# Patient Record
Sex: Female | Born: 1955 | Race: Black or African American | Hispanic: No | Marital: Single | Smoking: Never smoker
Health system: Southern US, Community
[De-identification: ages and names within clinical notes are randomized; demographics above are authoritative.]

## PROBLEM LIST (undated history)

## (undated) DIAGNOSIS — C50919 Malignant neoplasm of unspecified site of unspecified female breast: Secondary | ICD-10-CM

## (undated) DIAGNOSIS — R112 Nausea with vomiting, unspecified: Secondary | ICD-10-CM

## (undated) DIAGNOSIS — M722 Plantar fascial fibromatosis: Secondary | ICD-10-CM

## (undated) DIAGNOSIS — M1990 Unspecified osteoarthritis, unspecified site: Secondary | ICD-10-CM

## (undated) DIAGNOSIS — R011 Cardiac murmur, unspecified: Secondary | ICD-10-CM

## (undated) DIAGNOSIS — E079 Disorder of thyroid, unspecified: Secondary | ICD-10-CM

## (undated) DIAGNOSIS — Z9889 Other specified postprocedural states: Secondary | ICD-10-CM

## (undated) DIAGNOSIS — Z923 Personal history of irradiation: Secondary | ICD-10-CM

## (undated) DIAGNOSIS — E039 Hypothyroidism, unspecified: Secondary | ICD-10-CM

## (undated) DIAGNOSIS — Z803 Family history of malignant neoplasm of breast: Secondary | ICD-10-CM

## (undated) DIAGNOSIS — T8859XA Other complications of anesthesia, initial encounter: Secondary | ICD-10-CM

## (undated) DIAGNOSIS — I10 Essential (primary) hypertension: Secondary | ICD-10-CM

## (undated) DIAGNOSIS — Z9221 Personal history of antineoplastic chemotherapy: Secondary | ICD-10-CM

## (undated) DIAGNOSIS — T4145XA Adverse effect of unspecified anesthetic, initial encounter: Secondary | ICD-10-CM

## (undated) HISTORY — DX: Essential (primary) hypertension: I10

## (undated) HISTORY — DX: Unspecified osteoarthritis, unspecified site: M19.90

## (undated) HISTORY — PX: CATARACT EXTRACTION: SUR2

## (undated) HISTORY — PX: BREAST BIOPSY: SHX20

## (undated) HISTORY — DX: Family history of malignant neoplasm of breast: Z80.3

## (undated) HISTORY — DX: Disorder of thyroid, unspecified: E07.9

## (undated) HISTORY — PX: FOOT SURGERY: SHX648

## (undated) HISTORY — DX: Plantar fascial fibromatosis: M72.2

---

## 1988-08-17 HISTORY — PX: LUMBAR LAMINECTOMY: SHX95

## 1998-06-25 ENCOUNTER — Other Ambulatory Visit: Admission: RE | Admit: 1998-06-25 | Discharge: 1998-06-25 | Payer: Self-pay | Admitting: Obstetrics and Gynecology

## 1999-06-01 ENCOUNTER — Ambulatory Visit (HOSPITAL_COMMUNITY): Admission: RE | Admit: 1999-06-01 | Discharge: 1999-06-01 | Payer: Self-pay | Admitting: Neurosurgery

## 1999-06-01 ENCOUNTER — Encounter: Payer: Self-pay | Admitting: Neurosurgery

## 1999-12-02 ENCOUNTER — Other Ambulatory Visit: Admission: RE | Admit: 1999-12-02 | Discharge: 1999-12-02 | Payer: Self-pay | Admitting: Obstetrics and Gynecology

## 2000-12-07 ENCOUNTER — Other Ambulatory Visit: Admission: RE | Admit: 2000-12-07 | Discharge: 2000-12-07 | Payer: Self-pay | Admitting: Obstetrics and Gynecology

## 2001-12-27 ENCOUNTER — Other Ambulatory Visit: Admission: RE | Admit: 2001-12-27 | Discharge: 2001-12-27 | Payer: Self-pay | Admitting: Obstetrics and Gynecology

## 2003-01-17 ENCOUNTER — Encounter: Payer: Self-pay | Admitting: Obstetrics and Gynecology

## 2003-01-17 ENCOUNTER — Encounter: Admission: RE | Admit: 2003-01-17 | Discharge: 2003-01-17 | Payer: Self-pay | Admitting: Obstetrics and Gynecology

## 2003-12-26 ENCOUNTER — Encounter: Admission: RE | Admit: 2003-12-26 | Discharge: 2003-12-26 | Payer: Self-pay | Admitting: Internal Medicine

## 2003-12-27 ENCOUNTER — Encounter: Admission: RE | Admit: 2003-12-27 | Discharge: 2003-12-27 | Payer: Self-pay | Admitting: Gastroenterology

## 2004-09-22 ENCOUNTER — Ambulatory Visit: Payer: Self-pay | Admitting: Family Medicine

## 2004-10-13 ENCOUNTER — Ambulatory Visit: Payer: Self-pay | Admitting: Family Medicine

## 2006-02-04 ENCOUNTER — Encounter: Admission: RE | Admit: 2006-02-04 | Discharge: 2006-02-04 | Payer: Self-pay | Admitting: Obstetrics and Gynecology

## 2006-05-10 ENCOUNTER — Ambulatory Visit: Payer: Self-pay | Admitting: Family Medicine

## 2006-06-07 ENCOUNTER — Other Ambulatory Visit: Admission: RE | Admit: 2006-06-07 | Discharge: 2006-06-07 | Payer: Self-pay | Admitting: Obstetrics and Gynecology

## 2006-10-14 ENCOUNTER — Ambulatory Visit: Payer: Self-pay | Admitting: Family Medicine

## 2007-03-17 ENCOUNTER — Ambulatory Visit: Payer: Self-pay | Admitting: Family Medicine

## 2007-03-17 DIAGNOSIS — E039 Hypothyroidism, unspecified: Secondary | ICD-10-CM

## 2007-03-17 DIAGNOSIS — I10 Essential (primary) hypertension: Secondary | ICD-10-CM | POA: Insufficient documentation

## 2007-03-18 ENCOUNTER — Telehealth (INDEPENDENT_AMBULATORY_CARE_PROVIDER_SITE_OTHER): Payer: Self-pay | Admitting: *Deleted

## 2007-03-18 LAB — CONVERTED CEMR LAB
Basophils Absolute: 0.1 10*3/uL (ref 0.0–0.1)
Calcium: 9.3 mg/dL (ref 8.4–10.5)
Chloride: 104 meq/L (ref 96–112)
GFR calc non Af Amer: 95 mL/min
Glucose, Bld: 88 mg/dL (ref 70–99)
HCT: 38.6 % (ref 36.0–46.0)
Hemoglobin: 13.3 g/dL (ref 12.0–15.0)
Monocytes Absolute: 0.5 10*3/uL (ref 0.2–0.7)
Platelets: 304 10*3/uL (ref 150–400)
TSH: 3.81 microintl units/mL (ref 0.35–5.50)
Total CHOL/HDL Ratio: 3.1
Triglycerides: 66 mg/dL (ref 0–149)
VLDL: 13 mg/dL (ref 0–40)
WBC: 10.2 10*3/uL (ref 4.5–10.5)

## 2007-06-09 ENCOUNTER — Other Ambulatory Visit: Admission: RE | Admit: 2007-06-09 | Discharge: 2007-06-09 | Payer: Self-pay | Admitting: Obstetrics and Gynecology

## 2007-06-23 ENCOUNTER — Encounter: Admission: RE | Admit: 2007-06-23 | Discharge: 2007-06-23 | Payer: Self-pay | Admitting: Obstetrics and Gynecology

## 2007-07-01 ENCOUNTER — Encounter: Admission: RE | Admit: 2007-07-01 | Discharge: 2007-07-01 | Payer: Self-pay | Admitting: Obstetrics and Gynecology

## 2007-09-02 ENCOUNTER — Ambulatory Visit: Payer: Self-pay | Admitting: Family Medicine

## 2007-09-02 ENCOUNTER — Ambulatory Visit: Payer: Self-pay | Admitting: Cardiology

## 2007-09-02 LAB — CONVERTED CEMR LAB
CK-MB: 2.8 ng/mL (ref 0.3–4.0)
CO2: 28 meq/L (ref 19–32)
Chloride: 104 meq/L (ref 96–112)
Cholesterol: 198 mg/dL (ref 0–200)
Creatinine, Ser: 0.6 mg/dL (ref 0.4–1.2)
GFR calc Af Amer: 137 mL/min
GFR calc non Af Amer: 113 mL/min
HDL: 72.2 mg/dL (ref >39.0)
LDL Cholesterol: 116 mg/dL — ABNORMAL HIGH (ref 0–99)
Potassium: 3.4 meq/L — ABNORMAL LOW (ref 3.5–5.1)
Total CK: 94 units/L (ref 7–177)
Triglycerides: 48 mg/dL (ref 0–149)
Troponin I: 0.06 ng/mL — ABNORMAL HIGH (ref <0.06)

## 2007-09-05 ENCOUNTER — Encounter (INDEPENDENT_AMBULATORY_CARE_PROVIDER_SITE_OTHER): Payer: Self-pay | Admitting: *Deleted

## 2007-09-12 ENCOUNTER — Encounter: Payer: Self-pay | Admitting: Cardiology

## 2007-09-12 ENCOUNTER — Ambulatory Visit: Payer: Self-pay

## 2007-10-26 ENCOUNTER — Ambulatory Visit: Payer: Self-pay | Admitting: Family Medicine

## 2007-10-27 ENCOUNTER — Encounter (INDEPENDENT_AMBULATORY_CARE_PROVIDER_SITE_OTHER): Payer: Self-pay | Admitting: *Deleted

## 2007-10-27 LAB — CONVERTED CEMR LAB
GFR calc Af Amer: 114 mL/min
GFR calc non Af Amer: 95 mL/min
Sodium: 140 meq/L (ref 135–145)

## 2008-01-30 ENCOUNTER — Ambulatory Visit: Payer: Self-pay | Admitting: Family Medicine

## 2008-03-09 ENCOUNTER — Ambulatory Visit: Payer: Self-pay | Admitting: Family Medicine

## 2008-03-10 LAB — CONVERTED CEMR LAB
CO2: 23 meq/L (ref 19–32)
Glucose, Bld: 76 mg/dL (ref 70–99)

## 2008-03-12 ENCOUNTER — Encounter (INDEPENDENT_AMBULATORY_CARE_PROVIDER_SITE_OTHER): Payer: Self-pay | Admitting: *Deleted

## 2008-05-11 ENCOUNTER — Telehealth (INDEPENDENT_AMBULATORY_CARE_PROVIDER_SITE_OTHER): Payer: Self-pay | Admitting: *Deleted

## 2008-05-23 LAB — CONVERTED CEMR LAB: Pap Smear: NORMAL

## 2008-06-12 ENCOUNTER — Other Ambulatory Visit: Admission: RE | Admit: 2008-06-12 | Discharge: 2008-06-12 | Payer: Self-pay | Admitting: Obstetrics and Gynecology

## 2008-06-18 ENCOUNTER — Ambulatory Visit: Payer: Self-pay | Admitting: Family Medicine

## 2008-06-18 DIAGNOSIS — L919 Hypertrophic disorder of the skin, unspecified: Secondary | ICD-10-CM

## 2008-06-18 DIAGNOSIS — L909 Atrophic disorder of skin, unspecified: Secondary | ICD-10-CM | POA: Insufficient documentation

## 2008-07-24 ENCOUNTER — Encounter: Admission: RE | Admit: 2008-07-24 | Discharge: 2008-07-24 | Payer: Self-pay | Admitting: Obstetrics and Gynecology

## 2008-09-05 ENCOUNTER — Ambulatory Visit: Payer: Self-pay | Admitting: Family Medicine

## 2008-09-18 ENCOUNTER — Encounter: Payer: Self-pay | Admitting: Family Medicine

## 2008-09-18 LAB — HM COLONOSCOPY

## 2008-09-21 LAB — CONVERTED CEMR LAB
ALT: 13 units/L (ref 0–35)
AST: 14 units/L (ref 0–37)
BUN: 9 mg/dL (ref 6–23)
Bilirubin, Direct: 0.1 mg/dL (ref 0.0–0.3)
CO2: 28 meq/L (ref 19–32)
Chloride: 107 meq/L (ref 96–112)
Cholesterol: 186 mg/dL (ref 0–200)
Eosinophils Absolute: 0.1 10*3/uL (ref 0.0–0.7)
Glucose, Bld: 83 mg/dL (ref 70–99)
Hemoglobin: 13.5 g/dL (ref 12.0–15.0)
Lymphocytes Relative: 20.2 % (ref 12.0–46.0)
MCV: 85 fL (ref 78.0–100.0)
Monocytes Absolute: 0.5 10*3/uL (ref 0.1–1.0)
Neutro Abs: 6.9 10*3/uL (ref 1.4–7.7)
Neutrophils Relative %: 72.7 % (ref 43.0–77.0)
Platelets: 328 10*3/uL (ref 150–400)
RBC: 4.68 M/uL (ref 3.87–5.11)
Total Bilirubin: 0.8 mg/dL (ref 0.3–1.2)
WBC: 9.5 10*3/uL (ref 4.5–10.5)

## 2008-09-24 ENCOUNTER — Encounter (INDEPENDENT_AMBULATORY_CARE_PROVIDER_SITE_OTHER): Payer: Self-pay | Admitting: *Deleted

## 2008-09-27 ENCOUNTER — Telehealth (INDEPENDENT_AMBULATORY_CARE_PROVIDER_SITE_OTHER): Payer: Self-pay | Admitting: *Deleted

## 2008-10-16 ENCOUNTER — Ambulatory Visit: Payer: Self-pay | Admitting: Family Medicine

## 2008-10-16 DIAGNOSIS — B009 Herpesviral infection, unspecified: Secondary | ICD-10-CM | POA: Insufficient documentation

## 2008-11-07 ENCOUNTER — Telehealth: Payer: Self-pay | Admitting: Family Medicine

## 2008-11-22 ENCOUNTER — Telehealth (INDEPENDENT_AMBULATORY_CARE_PROVIDER_SITE_OTHER): Payer: Self-pay | Admitting: *Deleted

## 2009-03-01 ENCOUNTER — Ambulatory Visit: Payer: Self-pay | Admitting: Family Medicine

## 2009-03-15 ENCOUNTER — Ambulatory Visit: Payer: Self-pay | Admitting: Family Medicine

## 2009-06-14 ENCOUNTER — Ambulatory Visit: Payer: Self-pay | Admitting: Family Medicine

## 2009-06-21 ENCOUNTER — Other Ambulatory Visit: Admission: RE | Admit: 2009-06-21 | Discharge: 2009-06-21 | Payer: Self-pay | Admitting: Obstetrics and Gynecology

## 2009-07-30 ENCOUNTER — Encounter: Admission: RE | Admit: 2009-07-30 | Discharge: 2009-07-30 | Payer: Self-pay | Admitting: Obstetrics and Gynecology

## 2009-11-15 ENCOUNTER — Encounter (INDEPENDENT_AMBULATORY_CARE_PROVIDER_SITE_OTHER): Payer: Self-pay | Admitting: *Deleted

## 2009-11-15 ENCOUNTER — Ambulatory Visit: Payer: Self-pay | Admitting: Family Medicine

## 2009-11-15 LAB — CONVERTED CEMR LAB
Bilirubin Urine: NEGATIVE
Nitrite: NEGATIVE
Specific Gravity, Urine: 1.02
Urobilinogen, UA: NEGATIVE
pH: 6.5

## 2009-11-18 ENCOUNTER — Telehealth (INDEPENDENT_AMBULATORY_CARE_PROVIDER_SITE_OTHER): Payer: Self-pay | Admitting: *Deleted

## 2009-11-18 LAB — CONVERTED CEMR LAB
Albumin: 3.6 g/dL (ref 3.5–5.2)
Alkaline Phosphatase: 67 units/L (ref 39–117)
Basophils Relative: 0.4 % (ref 0.0–3.0)
CO2: 27 meq/L (ref 19–32)
Chloride: 100 meq/L (ref 96–112)
Cholesterol: 166 mg/dL (ref 0–200)
Eosinophils Absolute: 0.1 10*3/uL (ref 0.0–0.7)
GFR calc non Af Amer: 135.12 mL/min (ref >60)
HDL: 65.6 mg/dL (ref >39.00)
LDL Cholesterol: 94 mg/dL (ref 0–99)
Lymphocytes Relative: 20.2 % (ref 12.0–46.0)
Lymphs Abs: 2.4 10*3/uL (ref 0.7–4.0)
MCV: 85.4 fL (ref 78.0–100.0)
Neutro Abs: 8.6 10*3/uL — ABNORMAL HIGH (ref 1.4–7.7)
Neutrophils Relative %: 72.1 % (ref 43.0–77.0)
Potassium: 3.3 meq/L — ABNORMAL LOW (ref 3.5–5.1)
RBC: 4.73 M/uL (ref 3.87–5.11)
TSH: 0.5 microintl units/mL (ref 0.35–5.50)
Total Bilirubin: 0.7 mg/dL (ref 0.3–1.2)
Total CHOL/HDL Ratio: 3
Triglycerides: 31 mg/dL (ref 0.0–149.0)
WBC: 12 10*3/uL — ABNORMAL HIGH (ref 4.5–10.5)

## 2009-12-16 ENCOUNTER — Ambulatory Visit: Payer: Self-pay | Admitting: Family Medicine

## 2009-12-16 ENCOUNTER — Telehealth (INDEPENDENT_AMBULATORY_CARE_PROVIDER_SITE_OTHER): Payer: Self-pay | Admitting: *Deleted

## 2009-12-24 ENCOUNTER — Ambulatory Visit: Payer: Self-pay | Admitting: Family Medicine

## 2009-12-24 DIAGNOSIS — J019 Acute sinusitis, unspecified: Secondary | ICD-10-CM

## 2010-01-07 ENCOUNTER — Ambulatory Visit: Payer: Self-pay | Admitting: Family Medicine

## 2010-01-14 LAB — CONVERTED CEMR LAB
BUN: 14 mg/dL (ref 6–23)
Calcium: 9.5 mg/dL (ref 8.4–10.5)
Chloride: 104 meq/L (ref 96–112)
Creatinine, Ser: 0.8 mg/dL (ref 0.4–1.2)
Eosinophils Relative: 2.4 % (ref 0.0–5.0)
Glucose, Bld: 126 mg/dL — ABNORMAL HIGH (ref 70–99)
HCT: 41 % (ref 36.0–46.0)
Hemoglobin: 13.8 g/dL (ref 12.0–15.0)
Lymphocytes Relative: 44.5 % (ref 12.0–46.0)
Lymphs Abs: 2.1 10*3/uL (ref 0.7–4.0)
MCHC: 33.8 g/dL (ref 30.0–36.0)
Monocytes Absolute: 0.5 10*3/uL (ref 0.1–1.0)
Neutro Abs: 2 10*3/uL (ref 1.4–7.7)
Platelets: 238 10*3/uL (ref 150.0–400.0)
Potassium: 3.8 meq/L (ref 3.5–5.1)
Sodium: 141 meq/L (ref 135–145)

## 2010-06-27 ENCOUNTER — Telehealth (INDEPENDENT_AMBULATORY_CARE_PROVIDER_SITE_OTHER): Payer: Self-pay | Admitting: *Deleted

## 2010-07-03 ENCOUNTER — Telehealth (INDEPENDENT_AMBULATORY_CARE_PROVIDER_SITE_OTHER): Payer: Self-pay | Admitting: *Deleted

## 2010-08-07 ENCOUNTER — Encounter
Admission: RE | Admit: 2010-08-07 | Discharge: 2010-08-07 | Payer: Self-pay | Source: Home / Self Care | Attending: Obstetrics and Gynecology | Admitting: Obstetrics and Gynecology

## 2010-08-08 ENCOUNTER — Other Ambulatory Visit
Admission: RE | Admit: 2010-08-08 | Discharge: 2010-08-08 | Payer: Self-pay | Source: Home / Self Care | Admitting: Obstetrics and Gynecology

## 2010-09-07 ENCOUNTER — Encounter: Payer: Self-pay | Admitting: Obstetrics and Gynecology

## 2010-09-18 NOTE — Progress Notes (Signed)
Summary: POTASSIUM CL   REFILL  Phone Note Refill Request Message from:  Fax from Pharmacy on June 27, 2010 2:49 PM  Refills Requested: Medication #1:  KLOR-CON M20 20 MEQ CR-TABS 1 by mouth daily.   Last Refilled: 05/29/2010 Rushie Chestnut,  [address], Doroteo Glassman [phone]  Initial call taken by: Jerolyn Shin,  June 27, 2010 2:50 PM    Prescriptions: KLOR-CON M20 20 MEQ CR-TABS (POTASSIUM CHLORIDE CRYS CR) 1 by mouth daily.  #30 Each x 2   Entered by:   Jeremy Johann CMA   Authorized by:   Loreen Freud DO   Signed by:   Jeremy Johann CMA on 06/27/2010   Method used:   Faxed to ...       [facility] [address] #[id number]* (retail)       [address] [place], [place]  [address]       Ph: [phone]       Fax: [phone]   RxID:   [id number]

## 2010-09-18 NOTE — Assessment & Plan Note (Signed)
Summary: congestion//cough//lch   Vital Signs:  Patient profile:   55 year old female Height:      65.5 inches Weight:      264 pounds BMI:     43.42 Temp:     98.3 degrees F oral Pulse rate:   88 / minute Pulse rhythm:   regular BP sitting:   122 / 84  (left arm) Cuff size:   large  Vitals Entered By: [name] CMA (Dec 24, 2009 4:00 PM) CC: Pt here c/o Chest congestion and Head congestion x 2 weeks., URI symptoms   History of Present Illness:       This is a 55 year old woman who presents with URI symptoms.  The symptoms began 2 weeks ago.  Pt took otc coricidan with no relief.  The patient complains of nasal congestion, purulent nasal discharge, productive cough, and sick contacts.  Associated symptoms include fever.  The patient denies low-grade fever (<100.5 degrees), fever of 100.5-103 degrees, fever of 103.1-104 degrees, fever to >104 degrees, stiff neck, dyspnea, wheezing, rash, vomiting, diarrhea, use of an antipyretic, and response to antipyretic.  The patient denies itchy watery eyes, itchy throat, sneezing, seasonal symptoms, response to antihistamine, headache, muscle aches, and severe fatigue.  The patient denies the following risk factors for Strep sinusitis: unilateral facial pain, unilateral nasal discharge, poor response to decongestant, double sickening, tooth pain, Strep exposure, tender adenopathy, and absence of cough.    Current Medications (verified): 1)  Levoxyl 150 Mcg  Tabs (Levothyroxine Sodium) .... Take One Tablet Daily 2)  Jolivette 0.35 Mg Tabs (Norethindrone) .Marland Kitchen.. 1 By Mouth Daily. 3)  Valtrex 1 Gm Tabs (Valacyclovir Hcl) .... As Directed 4)  Hydrochlorothiazide 25 Mg Tabs (Hydrochlorothiazide) .... Take 1 Tab Once Daily 5)  Coreg 6.25 Mg Tabs (Carvedilol) .Marland Kitchen.. 1 By Mouth Two Times A Day 6)  Klor-Con M20 20 Meq Cr-Tabs (Potassium Chloride Crys Cr) .Marland Kitchen.. 1 By Mouth Daily.  Allergies: 1)  ! Penicillin 2)  ! Lisinopril  Physical Exam  General:   Well-developed,well-nourished,in no acute distress; alert,appropriate and cooperative throughout examination Ears:  External ear exam shows no significant lesions or deformities.  Otoscopic examination reveals clear canals, tympanic membranes are intact bilaterally without bulging, retraction, inflammation or discharge. Hearing is grossly normal bilaterally. Nose:  L frontal sinus tenderness and R frontal sinus tenderness.   Mouth:  Oral mucosa and oropharynx without lesions or exudates.  Teeth in good repair. Neck:  cervical lymphadenopathy.   Lungs:  Normal respiratory effort, chest expands symmetrically. Lungs are clear to auscultation, no crackles or wheezes. Heart:  normal rate and no murmur.   Psych:  Oriented X3 and normally interactive.     Impression & Recommendations:  Problem # 1:  SINUSITIS - ACUTE-NOS (ICD-461.9)  Instructed on treatment. Call if symptoms persist or worsen.   Her updated medication list for this problem includes:    Biaxin Xl 500 Mg Xr24h-tab (Clarithromycin) .Marland Kitchen... 2 by mouth once daily    Veramyst 27.5 Mcg/spray Susp (Fluticasone furoate) .Marland Kitchen... 2 sprays each nostril once daily    Mucinex Maximum Strength 1200 Mg Xr12h-tab (Guaifenesin)  Complete Medication List: 1)  Levoxyl 150 Mcg Tabs (Levothyroxine sodium) .... Take one tablet daily 2)  Jolivette 0.35 Mg Tabs (Norethindrone) .Marland Kitchen.. 1 by mouth daily. 3)  Valtrex 1 Gm Tabs (Valacyclovir hcl) .... As directed 4)  Hydrochlorothiazide 25 Mg Tabs (Hydrochlorothiazide) .... Take 1 tab once daily 5)  Coreg 6.25 Mg Tabs (Carvedilol) .Marland Kitchen.. 1 by  mouth two times a day 6)  Klor-con M20 20 Meq Cr-tabs (Potassium chloride crys cr) .Marland Kitchen.. 1 by mouth daily. 7)  Biaxin Xl 500 Mg Xr24h-tab (Clarithromycin) .... 2 by mouth once daily 8)  Veramyst 27.5 Mcg/spray Susp (Fluticasone furoate) .... 2 sprays each nostril once daily 9)  Mucinex Maximum Strength 1200 Mg Xr12h-tab (Guaifenesin)  Patient Instructions: 1)  288.8  401.9   bmp, cbcd---2 weeks Prescriptions: BIAXIN XL 500 MG XR24H-TAB (CLARITHROMYCIN) 2 by mouth once daily  #28 x 0   Entered and Authorized by:   Loreen Freud DO   Signed by:   Loreen Freud DO on 12/24/2009   Method used:   Electronically to        [facility] #[id number]* (retail)       [address] [place], [place]  [address]       Ph: [phone]       Fax: [phone]   RxID:   [id number] KLOR-CON M20 20 MEQ CR-TABS (POTASSIUM CHLORIDE CRYS CR) 1 by mouth daily.  #30 x 2   Entered by:   [name] CMA   Authorized by:   Loreen Freud DO   Signed by:   [name] CMA on 12/24/2009   Method used:   Electronically to        [facility] #[id number]* (retail)       [address] [place], [place]  [address]       Ph: [phone]       Fax: [phone]   RxID:   [id number]

## 2010-09-18 NOTE — Progress Notes (Signed)
Summary: iFob  Phone Note Outgoing Call   Caller: Patient Call placed by: [name] CMA,  Dec 16, 2009 11:29 AM Summary of Call: Got a call from [facility] lab, pt never turned in ifob kit. I called pt and left a voicemail informing her to turin it in or there would be a charge. [name] CMA  Dec 16, 2009 11:29 AM

## 2010-09-18 NOTE — Progress Notes (Signed)
Summary: Lab results   Phone Note Outgoing Call   Call placed by: [name] CMA,  November 18, 2009 4:51 PM Summary of Call: Regarding lab results, LMTCB:  WBC elevated---pt feeling ok---fever , chills etc-----recheck 2-3 weeks  cbcd 288.8    401.9  bmp K low----- send K rich diet----start Kcl 20 meq  #30  1 by mouth once daily -----recheck in 2-3 weeks as well Signed by Loreen Freud DO on 11/17/2009 at 8:40 PM  Follow-up for Phone Call        Left message to call back. [name] CMA  November 19, 2009 4:55 PM   Additional Follow-up for Phone Call Additional follow up Details #1::        Pt called back on 4/6, I recevied message on 4/7. I called pt back and left a message for her to return call. [name] CMA  November 21, 2009 10:17 AM     Additional Follow-up for Phone Call Additional follow up Details #2::    mailed information to patient Follow-up by: Harold Barban,  November 22, 2009 4:14 PM

## 2010-09-18 NOTE — Assessment & Plan Note (Signed)
Summary: CPX,FASTING,[employer] INS/RH.....   Vital Signs:  Patient profile:   55 year old female Height:      65.5 inches Weight:      262 pounds Pulse rate:   92 / minute Pulse rhythm:   regular BP sitting:   126 / 86  (left arm) Cuff size:   large  Vitals Entered By: [name] CMA (November 15, 2009 1:02 PM) CC: Pt here for CPX, no pap.   History of Present Illness: Pt here for cpe and labs.  No pap---  pt has gyn.  No complaints.   Preventive Screening-Counseling & Management  Alcohol-Tobacco     Alcohol drinks/day: <1     Alcohol type: very rare     Smoking Status: never     Passive Smoke Exposure: no  Caffeine-Diet-Exercise     Caffeine use/day: 2     Does Patient Exercise: no     Exercise Counseling: to improve exercise regimen  Hep-HIV-STD-Contraception     HIV Risk: yes     Dental Visit-last 6 months yes     Dental Care Counseling: not indicated; dental care within six months     SBE monthly: no     SBE Education/Counseling: not indicated; SBE done regularly  Safety-Violence-Falls     Seat Belt Use: 100      Sexual History:  sngle.    Current Medications (verified): 1)  Levoxyl 150 Mcg  Tabs (Levothyroxine Sodium) .... Take One Tablet Daily 2)  Jolivette 0.35 Mg Tabs (Norethindrone) .Marland Kitchen.. 1 By Mouth Daily. 3)  Valtrex 1 Gm Tabs (Valacyclovir Hcl) .... As Directed 4)  Hydrochlorothiazide 25 Mg Tabs (Hydrochlorothiazide) .... Take 1 Tab Once Daily 5)  Coreg 6.25 Mg Tabs (Carvedilol) .Marland Kitchen.. 1 By Mouth Two Times A Day  Allergies: 1)  ! Penicillin 2)  ! Lisinopril  Past History:  Past Medical History: Last updated: 03/17/2007 Hypertension Hypothyroidism  Past Surgical History: Last updated: 03/17/2007 Lumbar laminectomy  Family History: Last updated: 09/05/2008 Family History Hypertension Maunt--breast Cancer at  53 yo  Social History: Last updated: 03/17/2007 Occupation: adminstrative assistance Single Never Smoked Alcohol use-yes Drug  use-no Regular exercise-no  Risk Factors: Alcohol Use: <1 (11/15/2009) Caffeine Use: 2 (11/15/2009) Exercise: no (11/15/2009)  Risk Factors: Smoking Status: never (11/15/2009) Passive Smoke Exposure: no (11/15/2009)  Family History: Reviewed history from 09/05/2008 and no changes required. Family History Hypertension Maunt--breast Cancer at  53 yo  Social History: Reviewed history from 03/17/2007 and no changes required. Occupation: adminstrative assistance Single Never Smoked Alcohol use-yes Drug use-no Regular exercise-no Dental Care w/in 6 mos.:  yes Sexual History:  sngle  Review of Systems      See HPI General:  Denies chills, fatigue, fever, loss of appetite, malaise, sleep disorder, sweats, weakness, and weight loss. Eyes:  Denies blurring, discharge, double vision, eye irritation, eye pain, halos, itching, light sensitivity, red eye, vision loss-1 eye, and vision loss-both eyes; optho--q1y . ENT:  Denies decreased hearing, difficulty swallowing, ear discharge, earache, hoarseness, nasal congestion, nosebleeds, postnasal drainage, ringing in ears, sinus pressure, and sore throat. CV:  Denies bluish discoloration of lips or nails, chest pain or discomfort, difficulty breathing at night, difficulty breathing while lying down, fainting, fatigue, leg cramps with exertion, lightheadness, near fainting, palpitations, shortness of breath with exertion, swelling of feet, swelling of hands, and weight gain. Resp:  Denies chest discomfort, chest pain with inspiration, cough, coughing up blood, excessive snoring, hypersomnolence, morning headaches, pleuritic, shortness of breath, sputum productive, and  wheezing. GI:  Denies abdominal pain, bloody stools, change in bowel habits, constipation, dark tarry stools, diarrhea, excessive appetite, gas, hemorrhoids, indigestion, loss of appetite, and nausea. GU:  Denies abnormal vaginal bleeding, decreased libido, discharge, dysuria, genital  sores, hematuria, incontinence, nocturia, urinary frequency, and urinary hesitancy. MS:  Complains of joint pain; denies joint redness, joint swelling, loss of strength, low back pain, mid back pain, muscle aches, muscle , cramps, muscle weakness, stiffness, and thoracic pain; podiatry-- Dr Charlsie Merles . Vaughan Sine:  Denies changes in color of skin, changes in nail beds, dryness, excessive perspiration, flushing, hair loss, insect bite(s), itching, lesion(s), poor wound healing, and rash. Neuro:  Denies brief paralysis, difficulty with concentration, disturbances in coordination, falling down, headaches, inability to speak, memory loss, numbness, poor balance, seizures, sensation of room spinning, tingling, tremors, visual disturbances, and weakness. Psych:  Denies alternate hallucination ( auditory/visual), anxiety, depression, easily angered, easily tearful, irritability, mental problems, panic attacks, sense of great danger, suicidal thoughts/plans, thoughts of violence, unusual visions or sounds, and thoughts /plans of harming others. Endo:  Denies cold intolerance, excessive hunger, excessive thirst, excessive urination, heat intolerance, polyuria, and weight change. Heme:  Denies abnormal bruising, bleeding, enlarge lymph nodes, fevers, pallor, and skin discoloration. Allergy:  Denies hives or rash, itching eyes, persistent infections, seasonal allergies, and sneezing.  Physical Exam  General:  Well-developed,well-nourished,in no acute distress; alert,appropriate and cooperative throughout examination Head:  Normocephalic and atraumatic without obvious abnormalities. No apparent alopecia or balding. Eyes:  pupils equal, pupils round, pupils reactive to light, and no injection.   Ears:  External ear exam shows no significant lesions or deformities.  Otoscopic examination reveals clear canals, tympanic membranes are intact bilaterally without bulging, retraction, inflammation or discharge. Hearing is grossly  normal bilaterally. Nose:  External nasal examination shows no deformity or inflammation. Nasal mucosa are pink and moist without lesions or exudates. Mouth:  Oral mucosa and oropharynx without lesions or exudates.  Teeth in good repair. Neck:  No deformities, masses, or tenderness noted. Breasts:  gyn Lungs:  Normal respiratory effort, chest expands symmetrically. Lungs are clear to auscultation, no crackles or wheezes. Heart:  normal rate and no murmur.   Abdomen:  Bowel sounds positive,abdomen soft and non-tender without masses, organomegaly or hernias noted. Genitalia:  gyn Msk:  No deformity or scoliosis noted of thoracic or lumbar spine.   Pulses:  R and L carotid,radial,femoral,dorsalis pedis and posterior tibial pulses are full and equal bilaterally Extremities:  No clubbing, cyanosis, edema, or deformity noted with normal full range of motion of all joints.   Neurologic:  alert & oriented X3 and strength normal in all extremities.   Skin:  Intact without suspicious lesions or rashes Cervical Nodes:  No lymphadenopathy noted Axillary Nodes:  No palpable lymphadenopathy Psych:  Cognition and judgment appear intact. Alert and cooperative with normal attention span and concentration. No apparent delusions, illusions, hallucinations   Impression & Recommendations:  Problem # 1:  PREVENTIVE HEALTH CARE (ICD-V70.0) check labs ghm utd  Orders: Venipuncture (16073) TLB-Lipid Panel (80061-LIPID) TLB-BMP (Basic Metabolic Panel-BMET) (80048-METABOL) TLB-CBC Platelet - w/Differential (85025-CBCD) TLB-Hepatic/Liver Function Pnl (80076-HEPATIC) TLB-TSH (Thyroid Stimulating Hormone) (84443-TSH) UA Dipstick w/Micro (automated) (81001)  Problem # 2:  MORBID OBESITY (ICD-278.01)  Ht: 65.5 (11/15/2009)   Wt: 262 (11/15/2009)   BMI: 43.95 (03/01/2009)  Problem # 3:  HYPOTHYROIDISM (ICD-244.9)  Her updated medication list for this problem includes:    Levoxyl 150 Mcg Tabs (Levothyroxine  sodium) .Marland Kitchen... Take one tablet daily  Orders:  Venipuncture [phone]) TLB-Lipid Panel (80061-LIPID) TLB-BMP (Basic Metabolic Panel-BMET) (80048-METABOL) TLB-CBC Platelet - w/Differential (85025-CBCD) TLB-Hepatic/Liver Function Pnl (80076-HEPATIC) TLB-TSH (Thyroid Stimulating Hormone) (84443-TSH)  Labs Reviewed: TSH: 0.86 (09/05/2008)    Chol: 186 (09/05/2008)   HDL: 65.9 (09/05/2008)   LDL: 111 (09/05/2008)   TG: 48 (09/05/2008)  Problem # 4:  HYPERTENSION (ICD-401.9)  Her updated medication list for this problem includes:    Hydrochlorothiazide 25 Mg Tabs (Hydrochlorothiazide) .Marland Kitchen... Take 1 tab once daily    Coreg 6.25 Mg Tabs (Carvedilol) .Marland Kitchen... 1 by mouth two times a day  Orders: Venipuncture (29528) TLB-Lipid Panel (80061-LIPID) TLB-BMP (Basic Metabolic Panel-BMET) (80048-METABOL) TLB-CBC Platelet - w/Differential (85025-CBCD) TLB-Hepatic/Liver Function Pnl (80076-HEPATIC) TLB-TSH (Thyroid Stimulating Hormone) (84443-TSH)  BP today: 126/86 Prior BP: 130/88 (06/14/2009)  Labs Reviewed: K+: 3.8 (09/05/2008) Creat: : 0.6 (09/05/2008)   Chol: 186 (09/05/2008)   HDL: 65.9 (09/05/2008)   LDL: 111 (09/05/2008)   TG: 48 (09/05/2008)  Complete Medication List: 1)  Levoxyl 150 Mcg Tabs (Levothyroxine sodium) .... Take one tablet daily 2)  Jolivette 0.35 Mg Tabs (Norethindrone) .Marland Kitchen.. 1 by mouth daily. 3)  Valtrex 1 Gm Tabs (Valacyclovir hcl) .... As directed 4)  Hydrochlorothiazide 25 Mg Tabs (Hydrochlorothiazide) .... Take 1 tab once daily 5)  Coreg 6.25 Mg Tabs (Carvedilol) .Marland Kitchen.. 1 by mouth two times a day  Other Orders: Tdap => 74yrs IM (41324) Admin 1st Vaccine (40102) Prescriptions: LEVOXYL 150 MCG  TABS (LEVOTHYROXINE SODIUM) Take one tablet daily  #30 Tablet x 11   Entered and Authorized by:   Loreen Freud DO   Signed by:   Loreen Freud DO on 11/15/2009   Method used:   Electronically to        [facility] #[id number]* (retail)       [address] [place], [place]  [address]       Ph: [phone]       Fax: [phone]   RxID:   [id number] HYDROCHLOROTHIAZIDE 25 MG TABS (HYDROCHLOROTHIAZIDE) TAKE 1 TAB ONCE DAILY  #100 x 2   Entered and Authorized by:   Loreen Freud DO   Signed by:   Loreen Freud DO on 11/15/2009   Method used:   Electronically to        [facility] #[id number]* (retail)       [address] [place], [place]  [address]       Ph: [phone]       Fax: [phone]   RxID:   [id number] VALTREX 1 GM TABS (VALACYCLOVIR HCL) as directed  #30 x 5   Entered and Authorized by:   Loreen Freud DO   Signed by:   Loreen Freud DO on 11/15/2009   Method used:   Electronically to        [facility] #[id number]* (retail)       [address] [place], [place]  [address]       Ph: [phone]       Fax: [phone]   RxID:   [id number] COREG 6.25 MG TABS (CARVEDILOL) 1 by mouth two times a day  #60.0 Each x 5   Entered and Authorized by:   Loreen Freud DO   Signed by:   Loreen Freud DO on 11/15/2009   Method used:   Electronically to        [facility] #[id number]* (retail)  [address]       [place], [place]  [address]       Ph: [phone]       Fax: [phone]   RxID:   [id number]    EKG  Procedure date:  11/15/2009  Findings:      Normal sinus rhythm with rate of:  80 bpm    Colonoscopy Result Date:  09/18/2008 Colonoscopy Result:  polyps, diverticulosis, hemr Colonoscopy Next Due:  5 yr Last PAP:  Normal (05/23/2008 9:37:05 AM) PAP Result Date:  05/29/2009 PAP Result:  normal PAP Next Due:  1 yr Last Mammogram:  Normal Bilateral (07/04/2008 9:37:05 AM) Mammogram Result Date:  07/30/2009 Mammogram Result:  normal Mammogram Next Due:  1 yr   Immunizations Administered:  Tetanus Vaccine:    Vaccine Type: Tdap    Site: left deltoid    Mfr: GlaxoSmithKline    Dose: 0.5 ml    Route: IM    Given by: [name] CMA    Exp. Date: 11/09/2011     Lot #: [id number]  Laboratory Results   Urine Tests   Date/Time Reported: November 15, 2009 1:50 PM   Routine Urinalysis   Color: yellow Appearance: Clear Glucose: negative   (Normal Range: Negative) Bilirubin: negative   (Normal Range: Negative) Ketone: negative   (Normal Range: Negative) Spec. Gravity: 1.020   (Normal Range: 1.003-1.035) Blood: negative   (Normal Range: Negative) pH: 6.5   (Normal Range: 5.0-8.0) Protein: negative   (Normal Range: Negative) Urobilinogen: negative   (Normal Range: 0-1) Nitrite: negative   (Normal Range: Negative) Leukocyte Esterace: negative   (Normal Range: Negative)    Comments: Floydene Flock  November 15, 2009 1:50 PM

## 2010-09-18 NOTE — Letter (Signed)
Summary: [facility]: Immunoassay Fecal Occult Blood (iFOB) Order Form  [facility] [place]  [address] [place], [place] [address]   Phone: [phone]  Fax: [phone]      [facility]: Immunoassay Fecal Occult Blood (iFOB) Order Form   November 15, 2009 MRN: [id number]   Kayla Banks 07/19/1956   Physicican Name:_____Yvonne Lowne,DO____________________  Diagnosis Code:_______v76.51___________________      [name] CMA

## 2010-09-18 NOTE — Progress Notes (Signed)
Summary: 2nd request for potassium cl refill  Phone Note Refill Request Message from:  Fax from Pharmacy on July 03, 2010 12:57 PM  Refills Requested: Medication #1:  KLOR-CON M20 20 MEQ CR-TABS 1 by mouth daily.   Last Refilled: 05/29/2010   Notes: see note dated 11/11---second request from pharmacy [facility], [address], [place]  [phone]   [phone]   qty =30   note on fax states "Patient would like multiple refills on this prescriptioni"  Next Appointment Scheduled: none Initial call taken by: Jerolyn Shin,  July 03, 2010 12:59 PM    Prescriptions: KLOR-CON M20 20 MEQ CR-TABS (POTASSIUM CHLORIDE CRYS CR) 1 by mouth daily.  #30 Each x 4   Entered by:   Doristine Devoid CMA   Authorized by:   Loreen Freud DO   Signed by:   Doristine Devoid CMA on 07/03/2010   Method used:   Electronically to        [facility] #[id number]* (retail)       [address] [place], [place]  [address]       Ph: [phone]       Fax: [phone]   RxID:   [id number]

## 2010-09-25 ENCOUNTER — Encounter: Payer: Self-pay | Admitting: Family Medicine

## 2010-09-25 ENCOUNTER — Ambulatory Visit (INDEPENDENT_AMBULATORY_CARE_PROVIDER_SITE_OTHER): Payer: BC Managed Care – PPO | Admitting: Family Medicine

## 2010-09-25 DIAGNOSIS — M25569 Pain in unspecified knee: Secondary | ICD-10-CM

## 2010-09-26 ENCOUNTER — Ambulatory Visit: Payer: Self-pay | Admitting: Family Medicine

## 2010-10-02 NOTE — Assessment & Plan Note (Signed)
Summary: both knees hurting for weeks///sph   Vital Signs:  Patient profile:   55 year old female Height:      65.5 inches Weight:      262.2 pounds BMI:     43.12 Pulse rate:   84 / minute Pulse rhythm:   regular BP sitting:   130 / 88  (left arm) Cuff size:   large  Vitals Entered By: Almeta Monas CMA Duncan Dull) (September 25, 2010 3:24 PM) CC: c/o bilateral knee pain x6 mos---no med changes   History of Present Illness: Pt here c/o knee pain b/l--R > L -- It is now affecting her walking , bending etc.  Pain is constants.  Pt is taking advil with little relief.    Current Medications (verified): 1)  Levoxyl 150 Mcg  Tabs (Levothyroxine Sodium) .... Take One Tablet Daily 2)  Jolivette 0.35 Mg Tabs (Norethindrone) .Marland Kitchen.. 1 By Mouth Daily. 3)  Valtrex 1 Gm Tabs (Valacyclovir Hcl) .... As Directed 4)  Hydrochlorothiazide 25 Mg Tabs (Hydrochlorothiazide) .... Take 1 Tab Once Daily 5)  Coreg 6.25 Mg Tabs (Carvedilol) .Marland Kitchen.. 1 By Mouth Two Times A Day 6)  Klor-Con M20 20 Meq Cr-Tabs (Potassium Chloride Crys Cr) .Marland Kitchen.. 1 By Mouth Daily. 7)  Veramyst 27.5 Mcg/spray Susp (Fluticasone Furoate) .... 2 Sprays Each Nostril Once Daily 8)  Mucinex Maximum Strength 1200 Mg Xr12h-Tab (Guaifenesin) 9)  Mobic 15 Mg Tabs (Meloxicam) .... 1/2 -1 By Mouth Once Daily 10)  Knee Sleeve .... As Directed For B/l  Knee Pain  Allergies (verified): 1)  ! Penicillin 2)  ! Lisinopril  Past History:  Family History: Last updated: 09/05/2008 Family History Hypertension Maunt--breast Cancer at  53 yo  Social History: Last updated: 03/17/2007 Occupation: adminstrative assistance Single Never Smoked Alcohol use-yes Drug use-no Regular exercise-no  Risk Factors: Alcohol Use: <1 (11/15/2009) Caffeine Use: 2 (11/15/2009) Exercise: no (11/15/2009)  Risk Factors: Smoking Status: never (11/15/2009) Passive Smoke Exposure: no (11/15/2009)  Past medical, surgical, family and social histories (including risk  factors) reviewed for relevance to current acute and chronic problems.  Past Medical History: Reviewed history from 03/17/2007 and no changes required. Hypertension Hypothyroidism  Past Surgical History: Reviewed history from 03/17/2007 and no changes required. Lumbar laminectomy  Family History: Reviewed history from 09/05/2008 and no changes required. Family History Hypertension Maunt--breast Cancer at  53 yo  Social History: Reviewed history from 03/17/2007 and no changes required. 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 Lungs:  Normal respiratory effort, chest expands symmetrically. Lungs are clear to auscultation, no crackles or wheezes. Msk:  no joint warmth, no redness over joints, no joint deformities, joint tenderness, and joint swelling.  + crepitus b/L   Neurologic:  alert & oriented X3 and strength normal in all extremities.   Psych:  Cognition and judgment appear intact. Alert and cooperative with normal attention span and concentration. No apparent delusions, illusions, hallucinations   Impression & Recommendations:  Problem # 1:  KNEE PAIN, BILATERAL (ICD-719.46) to ortho if no relief with mobic or knee sleeve prob OA Her updated medication list for this problem includes:    Mobic 15 Mg Tabs (Meloxicam) .Marland Kitchen... 1/2 -1 by mouth once daily  Discussed strengthening exercises, use of ice or heat, and medications.  Complete Medication List: 1)  Levoxyl 150 Mcg Tabs (Levothyroxine sodium) .... Take one tablet daily 2)  Jolivette 0.35 Mg Tabs (Norethindrone) .Marland Kitchen.. 1 by mouth daily. 3)  Valtrex 1 Gm Tabs (Valacyclovir hcl) .... As directed 4)  Hydrochlorothiazide 25 Mg Tabs  (Hydrochlorothiazide) .... Take 1 tab once daily 5)  Coreg 6.25 Mg Tabs (Carvedilol) .Marland Kitchen.. 1 by mouth two times a day 6)  Klor-con M20 20 Meq Cr-tabs (Potassium chloride crys cr) .Marland Kitchen.. 1 by mouth daily. 7)  Veramyst 27.5 Mcg/spray Susp (Fluticasone furoate) .... 2 sprays each nostril once daily 8)  Mucinex Maximum Strength 1200 Mg Xr12h-tab (Guaifenesin) 9)  Mobic 15 Mg Tabs (Meloxicam) .... 1/2 -1 by mouth once daily 10)  Knee Sleeve  .... As directed for b/l  knee pain Prescriptions: KNEE SLEEVE as directed for b/l  knee pain  #2 x 0   Entered and Authorized by:   Loreen Freud DO   Signed by:   Loreen Freud DO on 09/25/2010   Method used:   Print then Give to Patient   RxID:   [id number] MOBIC 15 MG TABS (MELOXICAM) 1/2 -1 by mouth once daily  #30 x 2   Entered and Authorized by:   Loreen Freud DO   Signed by:   Loreen Freud DO on 09/25/2010   Method used:   Electronically to        [id number] (retail)       [address] [place], [place]  [address]       Ph: [phone]       Fax: [phone]   RxID:   [id number]    Orders Added: 1)  Est. Patient Level III [60109]

## 2010-10-07 ENCOUNTER — Encounter: Payer: Self-pay | Admitting: Family Medicine

## 2010-10-13 ENCOUNTER — Other Ambulatory Visit: Payer: Self-pay | Admitting: Obstetrics and Gynecology

## 2010-10-16 ENCOUNTER — Telehealth (INDEPENDENT_AMBULATORY_CARE_PROVIDER_SITE_OTHER): Payer: Self-pay | Admitting: *Deleted

## 2010-10-23 NOTE — Progress Notes (Signed)
Summary: Results from OBGYN shows abnl Thyroid values.  Phone Note Outgoing Call   Call placed by: Almeta Monas CMA Duncan Dull),  October 16, 2010 3:22 PM Call placed to: Patient Details for Reason: Results from OBGYN shows abnl Thyroid values. Summary of Call: .Marland KitchenMarland KitchenPer Dr.Lowne patient is to start synthroid 25 micrograms by mouth once daily BMN and recheck in 2 months...Marland KitchenMarland KitchenPt aware of the results and RX sent to [facility] on [address] Initial call taken by: Almeta Monas CMA Duncan Dull),  October 16, 2010 3:25 PM    New/Updated Medications: SYNTHROID 25 MCG TABS (LEVOTHYROXINE SODIUM) 1 by mouth once daily Prescriptions: SYNTHROID 25 MCG TABS (LEVOTHYROXINE SODIUM) 1 by mouth once daily  #30 x 2   Entered by:   Almeta Monas CMA (AAMA)   Authorized by:   Loreen Freud DO   Signed by:   Almeta Monas CMA (AAMA) on 10/16/2010   Method used:   Faxed to ...       [facility] [address] [id number]* (retail)       [address] [place], [place]  [address]       Ph: [phone]       Fax: [phone]   RxID:   [id number]

## 2010-10-29 ENCOUNTER — Encounter: Payer: Self-pay | Admitting: Family Medicine

## 2010-11-15 ENCOUNTER — Encounter: Payer: Self-pay | Admitting: Family Medicine

## 2010-11-19 ENCOUNTER — Ambulatory Visit (INDEPENDENT_AMBULATORY_CARE_PROVIDER_SITE_OTHER): Payer: BC Managed Care – PPO | Admitting: Family Medicine

## 2010-11-19 ENCOUNTER — Encounter: Payer: Self-pay | Admitting: Family Medicine

## 2010-11-19 VITALS — BP 158/92 | HR 72 | Ht 66.0 in | Wt 259.0 lb

## 2010-11-19 DIAGNOSIS — I10 Essential (primary) hypertension: Secondary | ICD-10-CM

## 2010-11-19 DIAGNOSIS — Z0000 Encounter for general adult medical examination without abnormal findings: Secondary | ICD-10-CM

## 2010-11-19 DIAGNOSIS — E039 Hypothyroidism, unspecified: Secondary | ICD-10-CM

## 2010-11-19 DIAGNOSIS — J069 Acute upper respiratory infection, unspecified: Secondary | ICD-10-CM | POA: Insufficient documentation

## 2010-11-19 LAB — POCT URINALYSIS DIPSTICK
Blood, UA: NEGATIVE
Glucose, UA: NEGATIVE
Nitrite, UA: NEGATIVE
Urobilinogen, UA: NEGATIVE
pH, UA: 5

## 2010-11-19 LAB — CBC WITH DIFFERENTIAL/PLATELET
Basophils Relative: 0.5 % (ref 0.0–3.0)
Eosinophils Relative: 2.8 % (ref 0.0–5.0)
Hemoglobin: 13.3 g/dL (ref 12.0–15.0)
Lymphocytes Relative: 18.6 % (ref 12.0–46.0)
Monocytes Relative: 5.7 % (ref 3.0–12.0)
Neutro Abs: 7.3 10*3/uL (ref 1.4–7.7)
RBC: 4.59 Mil/uL (ref 3.87–5.11)

## 2010-11-19 LAB — LIPID PANEL
Cholesterol: 191 mg/dL (ref 0–200)
LDL Cholesterol: 135 mg/dL — ABNORMAL HIGH (ref 0–99)
Total CHOL/HDL Ratio: 4

## 2010-11-19 LAB — HEPATIC FUNCTION PANEL
AST: 15 U/L (ref 0–37)
Albumin: 3.5 g/dL (ref 3.5–5.2)
Alkaline Phosphatase: 64 U/L (ref 39–117)
Total Protein: 6.9 g/dL (ref 6.0–8.3)

## 2010-11-19 LAB — BASIC METABOLIC PANEL
BUN: 12 mg/dL (ref 6–23)
Calcium: 9 mg/dL (ref 8.4–10.5)
Chloride: 105 mEq/L (ref 96–112)
Creatinine, Ser: 0.7 mg/dL (ref 0.4–1.2)
Potassium: 4.2 mEq/L (ref 3.5–5.1)
Sodium: 143 mEq/L (ref 135–145)

## 2010-11-19 MED ORDER — EPINEPHRINE 0.3 MG/0.3ML IJ DEVI: 0.3000 mg | Freq: Once | INTRAMUSCULAR | Status: AC

## 2010-11-19 MED ORDER — CARVEDILOL 6.25 MG PO TABS: 6.2500 mg | ORAL_TABLET | Freq: Two times a day (BID) | ORAL | Status: DC

## 2010-11-19 NOTE — Progress Notes (Signed)
  Subjective:     Kayla Banks is a 55 y.o. female and is here for a comprehensive physical exam. The patient reports problems - + URI symptoms.  History   Social History  . Marital Status: Single    Spouse Name: N/A    Number of Children: N/A  . Years of Education: N/A   Occupational History  . Not on file.   Social History Main Topics  . Smoking status: Never Smoker   . Smokeless tobacco: Never Used  . Alcohol Use: Yes  . Drug Use: No  . Sexually Active: Not on file   Other Topics Concern  . Not on file   Social History Narrative  . No narrative on file   Health Maintenance  Topic Date Due  . Pap Smear  02/01/1976  . Influenza Vaccine  05/18/2011  . Mammogram  08/07/2012  . Colonoscopy  09/18/2018  . Tetanus/tdap  11/16/2019    The following portions of the patient's history were reviewed and updated as appropriate: allergies, current medications, past family history, past medical history, past social history, past surgical history and problem list.  Review of Systems A comprehensive review of systems was negative except for: Ears, nose, mouth, throat, and face: positive for nasal congestion   Objective:    BP 158/92  Ht 5\' 6"  (1.676 m)  Wt 259 lb (117.482 kg)  BMI 41.80 kg/m2 General appearance: alert, cooperative, appears stated age and no distress Head: Normocephalic, without obvious abnormality, atraumatic Eyes: conjunctivae/corneas clear. PERRL, EOM's intact. Fundi benign. Ears: normal TM's and external ear canals both ears Nose: Nares normal. Septum midline. Mucosa normal. No drainage or sinus tenderness., moderate congestion Throat: lips, mucosa, and tongue normal; teeth and gums normal Neck: no adenopathy, no carotid bruit, no JVD, supple, symmetrical, trachea midline and thyroid not enlarged, symmetric, no tenderness/mass/nodules Lungs: clear to auscultation bilaterally Breasts: per gyn Heart: regular rate and rhythm, S1, S2 normal, no murmur, click,  rub or gallop Abdomen: soft, non-tender; bowel sounds normal; no masses,  no organomegaly Pelvic: per gyn Extremities: extremities normal, atraumatic, no cyanosis or edema Pulses: 2+ and symmetric Skin: Skin color, texture, turgor normal. No rashes or lesions Lymph nodes: Cervical, supraclavicular, and axillary nodes normal. Neurologic: Grossly normal    Assessment:    Healthy female exam.     Plan:     See After Visit Summary for Counseling Recommendations

## 2010-11-19 NOTE — Assessment & Plan Note (Signed)
Use otc antihistamine Pt refused nasal spray

## 2010-11-19 NOTE — Assessment & Plan Note (Signed)
Check tsh con't synthroid 

## 2010-11-19 NOTE — Patient Instructions (Signed)
Common Cold, Adult An upper respiratory tract infection, or cold, is a viral infection of the air passages to the lung. Colds are contagious, especially during the first 3 or 4 days. Antibiotics cannot cure a cold. Cold germs are spread by coughs, sneezes, and hand to hand contact. A respiratory tract infection usually clears up in a few days, but some people may be sick for a week or two. HOME CARE INSTRUCTIONS  Only take over-the-counter or prescription medicines for pain, discomfort, or fever as directed by your caregiver.   Be careful not to blow your nose too hard. This may cause a nosebleed.   Use a cool-mist humidifier (vaporizer) to increase air moisture. This will make it easier for you to breath. Do not use hot steam.   Rest as much as possible and get plenty of sleep.   Wash your hands often, especially after you blow your nose. Cover your mouth and nose with a tissue when you sneeze or cough.   Drink at least 8 glasses of clear liquids every day, such as water, fruit juices, tea, clear soups, and carbonated beverages.  SEEK MEDICAL CARE IF:  An oral temperature above 100.4 lasts 4 days or more, and is not controlled by medication.   You have a sore throat that gets worse or you see white or yellow spots in your throat.   Your cough gets worse or lasts more than 10 days.   You have a rash somewhere on your skin. You have large and tender lumps in your neck.   You have an earache or a headache.   You have thick, greenish or yellowish discharge from your nose.   You cough-up thick yellow, green, gray or bloody mucus (secretions).  SEEK IMMEDIATE MEDICAL CARE IF: You have trouble breathing, chest pain, or your skin or nails look gray or blue. MAKE SURE YOU:   Understand these instructions.   Will watch your condition.   Will get help right away if you are not doing well or get worse.  Document Released: 07/31/2000 Document Re-Released: 07/16/2008 ExitCare Patient  Information 2011 [employer]. 

## 2010-11-19 NOTE — Assessment & Plan Note (Addendum)
Cont meds Recheck 2-3 weeks

## 2010-11-19 NOTE — Assessment & Plan Note (Signed)
Check fasting labs ghm utd mammo and pap per gyn

## 2010-11-24 NOTE — Progress Notes (Signed)
Letter mailed     KP 

## 2010-12-09 ENCOUNTER — Encounter: Payer: Self-pay | Admitting: Family Medicine

## 2010-12-10 ENCOUNTER — Ambulatory Visit: Payer: BC Managed Care – PPO | Admitting: Family Medicine

## 2010-12-17 ENCOUNTER — Encounter: Payer: Self-pay | Admitting: Family Medicine

## 2010-12-17 ENCOUNTER — Ambulatory Visit (INDEPENDENT_AMBULATORY_CARE_PROVIDER_SITE_OTHER): Payer: BC Managed Care – PPO | Admitting: Family Medicine

## 2010-12-17 VITALS — BP 140/80 | HR 80 | Wt 254.8 lb

## 2010-12-17 DIAGNOSIS — I10 Essential (primary) hypertension: Secondary | ICD-10-CM

## 2010-12-17 DIAGNOSIS — M1710 Unilateral primary osteoarthritis, unspecified knee: Secondary | ICD-10-CM

## 2010-12-17 NOTE — Patient Instructions (Addendum)
Hypertension (High Blood Pressure) As your heart beats, it forces blood through your arteries. This force is your blood pressure. If the pressure is too high, it is called hypertension (HTN) or high blood pressure. HTN is dangerous because you may have it and not know it. High blood pressure may mean that your heart has to work harder to pump blood. Your arteries may be narrow or stiff. The extra work puts you at risk for heart disease, stroke, and other problems.  Blood pressure consists of two numbers, a higher number over a lower, 110/72, for example. It is stated as "110 over 72." The ideal is below 120 for the top number (systolic) and under 80 for the bottom (diastolic). Write down your blood pressure today. You should pay close attention to your blood pressure if you have certain conditions such as:  Heart failure.  Prior heart attack.   Diabetes   Chronic kidney disease.   Prior stroke.   Multiple risk factors for heart disease.   To see if you have HTN, your blood pressure should be measured while you are seated with your arm held at the level of the heart. It should be measured at least twice. A one-time elevated blood pressure reading (especially in the Emergency Department) does not mean that you need treatment. There may be conditions in which the blood pressure is different between your right and left arms. It is important to see your caregiver soon for a recheck. Most people have essential hypertension which means that there is not a specific cause. This type of high blood pressure may be lowered by changing lifestyle factors such as:  Stress.  Smoking.   Lack of exercise.   Excessive weight.  Drug/tobacco/alcohol use.   Eating less salt.   Most people do not have symptoms from high blood pressure until it has caused damage to the body. Effective treatment can often prevent, delay or reduce that damage. TREATMENT Treatment for high blood pressure, when a cause has been  identified, is directed at the cause. There are a large number of medications to treat HTN. These fall into several categories, and your caregiver will help you select the medicines that are best for you. Medications may have side effects. You should review side effects with your caregiver. If your blood pressure stays high after you have made lifestyle changes or started on medicines,   Your medication(s) may need to be changed.   Other problems may need to be addressed.   Be certain you understand your prescriptions, and know how and when to take your medicine.   Be sure to follow up with your caregiver within the time frame advised (usually within two weeks) to have your blood pressure rechecked and to review your medications.   If you are taking more than one medicine to lower your blood pressure, make sure you know how and at what times they should be taken. Taking two medicines at the same time can result in blood pressure that is too low.  SEEK IMMEDIATE MEDICAL CARE IF YOU DEVELOP:  A severe headache, blurred or changing vision, or confusion.   Unusual weakness or numbness, or a faint feeling.   Severe chest or abdominal pain, vomiting, or breathing problems.  MAKE SURE YOU:   Understand these instructions.   Will watch your condition.   Will get help right away if you are not doing well or get worse.  Document Released: 08/03/2005 Document Re-Released: 01/21/2010 ExitCare Patient Information 2011 [employer],   LL[employer]. Calorie Counting Diet A calorie counting diet requires you to eat the number of calories that are right for you during a day. Calories are the measurement of how much energy you get from the food you eat. Eating the right amount of calories is important for staying at a healthy weight. If you eat too many calories your body will store them as fat and you may gain weight. If you eat too few calories you may lose weight. Counting the number of calories that you eat during  a day will help you to know if you're eating the right amount. A Registered Dietitian can determine how many calories you need in a day. The amount of calories you need varies from person to person. If your goal is to lose weight you will need to eat fewer calories. Losing weight can benefit you if you are overweight or have health problems such as heart disease, high blood pressure or diabetes. If your goal is to gain weight, you will need to eat more calories. Gaining weight may be necessary if you have a certain health problem that causes your body to need more energy. TIPS Whether you are increasing or decreasing the number of calories you eat during a day, it may be hard to get used to changing what you eat and drink. The following are tips to help you keep track of the number of calories you are eating.  Measuring foods at home with measuring cups will help you to know the actual amount of food and number of calories you are eating.   Restaurants serve food in all different portion sizes. It is common that restaurants will serve food in amounts worth 2 or more serving sizes. While eating out, it may be helpful to estimate how many servings of a food you are given. For example, a serving of cooked rice is 1/2 cup and that is the size of half of a fist. Knowing serving sizes will help you have a better idea of how much food you are eating at restaurants.   Ask for smaller portion sizes or child-size portions at restaurants.   Plan to eat half of a meal at a restaurant and take the rest home or share the other half with a friend   Read food labels for calorie content and serving size   Most packaged food has a Nutrition Facts Panel on its side or back. Here you can find out how many servings are in a package, the size of a serving, and the number of calories each serving has.   The serving size and number of servings per container are listed right below the Nutrition Facts heading. Just below the  serving information, the number of calories in each serving is listed.   For example, say that a package has three cookies inside. The Nutrition Facts panel says that one serving is one cookie. Below that, it says that there are three servings in the container. The calories section of the Nutrition Facts says there are 90 calories. That means that there are 90 calories in one cookie. If you eat one cookie you have eaten 90 calories. If you eat all three cookies, you have eaten three times that amount, or 270 calories.  The list below tells you how big or small some common portion sizes are.  1 ounce (oz).................4 stacked dice.   3 oz.............................Marland KitchenDeck of cards.   1 teaspoon (tsp)..........Marland KitchenTip of little finger.   1 tablespoon (Tbsp).Marland KitchenMarland KitchenMarland KitchenTip of thumb.  2 Tbsp.........................Marland KitchenGolf ball.    Cup.........................Marland KitchenHalf of a fist.   1 Cup..........................Marland KitchenA fist.  KEEP A FOOD LOG Write down every food item that you eat, how much of the food you eat, and the number of calories in each food that you eat during the day. At the end of the day or throughout the day you can add up the total number of calories you have eaten.  It may help to set up a list like the one below. Find out the calorie information by reading food labels.  Breakfast   Bran Flakes (1 cup, 110 calories).   Fat free milk ( cup, 45 calories).   Snack   Apple (1 medium, 80 calories).   Lunch   Spinach (1 cup, 20 calories).   Tomato ( medium, 20 calories).   Chicken breast strips (3 oz, 165 calories).   Shredded cheddar cheese ( cup, 110 calories).   Light [place] dressing (2 Tbsp, 60 calories).   Whole wheat bread (1 slice, 80 calories).   Tub margarine (1 tsp, 35 calories).   Vegetable soup (1 cup, 160 calories).   Dinner   Pork chop (3 oz, 190 calories).   Brown rice (1 cup, 215 calories).   Steamed broccoli ( cup, 20 calories).   Strawberries  (1  cup, 65 calories).   Whipped cream (1 Tbsp, 50 calories).  Daily Calorie Total: 1425 Information from www.eatright.org, Foodwise Nutritional Analysis Database. Document Released: 08/03/2005 Document Re-Released: 08/25/2009 [facility] Patient Information 2011 [place], [place].

## 2010-12-17 NOTE — Progress Notes (Signed)
  Subjective:    Patient here for follow-up of elevated blood pressure.  She is not exercising and is adherent to a low-salt diet.  Blood pressure not checked  at home. Cardiac symptoms: none. Patient denies: chest pain, chest pressure/discomfort, dyspnea, exertional chest pressure/discomfort, fatigue, irregular heart beat and palpitations. Cardiovascular risk factors: hypertension, obesity (BMI >= 30 kg/m2) and sedentary lifestyle. Use of agents associated with hypertension: none. History of target organ damage: none.  The following portions of the patient's history were reviewed and updated as appropriate: allergies, current medications, past family history, past medical history, past social history, past surgical history and problem list.  Review of Systems Pertinent items are noted in HPI.     Objective:    BP 140/80  Pulse 80  Wt 254 lb 12.8 oz (115.577 kg) General appearance: alert, cooperative, appears stated age, no distress and morbidly obese Lungs: clear to auscultation bilaterally Heart: regular rate and rhythm and sem  2/6 Extremities: edema trace pitting b/l    Assessment:    Hypertension, normal blood pressure . Evidence of target organ damage: none.    Plan:    Medication: no change.

## 2010-12-30 NOTE — Assessment & Plan Note (Signed)
[facility]                            CARDIOLOGY OFFICE NOTE   NAME:Kayla Banks, Kayla Banks                     MRN:          [id number]  DATE:09/02/2007                            DOB:          16-Dec-1955    REASON FOR VISIT:  I was asked by Dr. Blossom Hoops to consult on this  patient for an abnormal EKG.   HISTORY:  Dr. Blossom Hoops saw her for follow up today.  She denies any  chest discomfort or shortness of breath.  She does have some risk  factors for coronary disease.  An EKG was obtained, which showed an RSR  prime with T-wave inversion in V1 and poor wave progression in the  anterior precordium.  The EKG was repeated three times. Baseline  troponin was 0.06, which is in the upper limits of normal.  She came  over today to see [place].   CARDIAC RISK FACTORS:  The patient's cardiac risk factors include a  fairly sedentary lifestyle, though she does walk a her Labrador/ Husky,  mixed, on a  leach for about 20 minutes everyday.  She is hypertensive  and is markedly overweight.  I do not know her lipid status.   PAST MEDICAL HISTORY:  The patient's past medical history is significant  allergic reaction to PENICILLIN.  She has no dye reaction.   MEDICATIONS:  The patient's current meds are:  1. Norvasc 10 mg a day.  2. Hydrochlorothiazide 25 mg a day.  3. Levoxyl 150 mcg a day.  4. Ortho Tri-Cyclen birth control.   HABITS:  The patient does not smoke.  She does not drink.   PAST SURGICAL HISTORY:  Back surgery for a ruptured disk and spinal  spurs 1991.   FAMILY HISTORY:  The patient's father had a heart attack or stroke, they  are not sure, at age 53.  He was a smoker.  He did not take good care of  himself.   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  She is single and has  no children.   REVIEW OF SYSTEMS:  On the review of systems other than the HPI she has  some  problems with chronic constipation, fatigue, arthritis,  particularly osteoarthritis, and hypothyroidism.   PHYSICAL EXAMINATION:  GENERAL APPEARANCE:  On her exam she is  extraordinarily pleasant.  She is in no acute distress.  VITAL SIGNS:  Respiratory rate is 18 and blood pressure is 169/93.  Her  pulse is 84 and regular.  She is 5 feet 6 inches, and she weighs 267  pounds.  HEENT:  Normocephalic and atraumatic.  PERRLA.  Extraocular movements  are intact.  Sclerae are clear.  Facial symmetry is normal.  She wears  glasses.  Dentition is satisfactory.  NECK:  The neck is supple.  Carotid upstrokes are equal bilateral  without bruits.  JVD cannot be assessed.  Thyroid is not enlarged.  Trachea is midline.  LUNGS:  The lungs are clear to auscultation.  HEART:  The heart reveals poorly  appreciated PMI.  She has large  breasts.  S2 is split, normal.  She has a soft S1 and S2.  ABDOMEN:  On abdominal exam it is obese with good bowel sounds.  Organomegaly cannot be adequately assessed.  EXTREMITIES:  The extremities reveal trace to 1+ pitting edema.  Pulses  are intact.  NEUROLOGIC:  The neuro exam is intact.   ASSESSMENT:  I suspect that the patient's electrocardiogram is within  normal limits considering her body habitus .  I explained this to her  today with a heart model.  However, I did also talk to her about  preventative health care for cardiovascular disease.  She will be at  moderate-to-high risk if she continues to have her weight issues not to  mention her hypertension.   I made a copy of her electrocardiogram to carry with her in the future  when she meets new doctors.  In addition, we will get a two-dimensional  echocardiogram to make sure the anterior wall is intact.  Otherwise  preventative care is what is in order here.     Thomas C. Daleen Squibb, MD, [facility]  Electronically Signed    TCW/MedQ  DD: 09/02/2007  DT: 09/03/2007  Job #: [id number]   cc:   Leanne Chang, M.D.

## 2011-01-10 ENCOUNTER — Other Ambulatory Visit: Payer: Self-pay | Admitting: Family Medicine

## 2011-01-13 NOTE — Telephone Encounter (Signed)
Last seen 12/17/10 and filled 09/25/10 with 2 refills     Please advise    KP

## 2011-01-20 ENCOUNTER — Other Ambulatory Visit: Payer: Self-pay | Admitting: Family Medicine

## 2011-02-05 ENCOUNTER — Other Ambulatory Visit (HOSPITAL_COMMUNITY): Payer: BC Managed Care – PPO

## 2011-02-12 ENCOUNTER — Ambulatory Visit (HOSPITAL_COMMUNITY)
Admission: RE | Admit: 2011-02-12 | Payer: BC Managed Care – PPO | Source: Ambulatory Visit | Admitting: Obstetrics and Gynecology

## 2011-03-23 ENCOUNTER — Ambulatory Visit: Payer: BC Managed Care – PPO | Admitting: Family Medicine

## 2011-03-24 ENCOUNTER — Other Ambulatory Visit: Payer: Self-pay | Admitting: Family Medicine

## 2011-03-24 ENCOUNTER — Ambulatory Visit: Payer: BC Managed Care – PPO | Admitting: Family Medicine

## 2011-03-24 NOTE — Telephone Encounter (Signed)
Last seen 12/17/10 and filled 02/16/11 please advise    KP

## 2011-03-25 NOTE — Telephone Encounter (Signed)
Rx sent to pharmacy   

## 2011-05-30 ENCOUNTER — Other Ambulatory Visit: Payer: Self-pay | Admitting: Family Medicine

## 2011-06-02 NOTE — Telephone Encounter (Signed)
Rx sent 

## 2011-06-02 NOTE — Telephone Encounter (Signed)
Last seen 12/17/10 and filled 03/24/11 # 30 with 1 refill. Please advise    KP

## 2011-06-23 ENCOUNTER — Other Ambulatory Visit: Payer: Self-pay | Admitting: Family Medicine

## 2011-09-05 ENCOUNTER — Other Ambulatory Visit: Payer: Self-pay | Admitting: Family Medicine

## 2011-09-18 ENCOUNTER — Other Ambulatory Visit: Payer: Self-pay | Admitting: Family Medicine

## 2011-09-19 ENCOUNTER — Other Ambulatory Visit: Payer: Self-pay | Admitting: Family Medicine

## 2011-11-23 ENCOUNTER — Encounter: Payer: BC Managed Care – PPO | Admitting: Family Medicine

## 2011-12-08 ENCOUNTER — Other Ambulatory Visit: Payer: Self-pay | Admitting: Obstetrics and Gynecology

## 2011-12-08 DIAGNOSIS — Z1231 Encounter for screening mammogram for malignant neoplasm of breast: Secondary | ICD-10-CM

## 2011-12-29 ENCOUNTER — Ambulatory Visit
Admission: RE | Admit: 2011-12-29 | Discharge: 2011-12-29 | Disposition: A | Discharge status: Home / Self Care | Payer: BC Managed Care – PPO | Source: Ambulatory Visit | Attending: Obstetrics and Gynecology | Admitting: Obstetrics and Gynecology

## 2011-12-29 DIAGNOSIS — Z1231 Encounter for screening mammogram for malignant neoplasm of breast: Secondary | ICD-10-CM

## 2012-01-07 ENCOUNTER — Other Ambulatory Visit (HOSPITAL_COMMUNITY)
Admission: RE | Admit: 2012-01-07 | Discharge: 2012-01-07 | Disposition: A | Discharge status: Home / Self Care | Payer: BC Managed Care – PPO | Source: Ambulatory Visit | Attending: Obstetrics and Gynecology | Admitting: Obstetrics and Gynecology

## 2012-01-07 ENCOUNTER — Other Ambulatory Visit: Payer: Self-pay | Admitting: Obstetrics and Gynecology

## 2012-01-07 DIAGNOSIS — Z01419 Encounter for gynecological examination (general) (routine) without abnormal findings: Secondary | ICD-10-CM | POA: Insufficient documentation

## 2012-01-11 ENCOUNTER — Other Ambulatory Visit: Payer: Self-pay | Admitting: Family Medicine

## 2012-01-26 ENCOUNTER — Encounter (HOSPITAL_COMMUNITY): Payer: Self-pay | Admitting: Pharmacist

## 2012-02-03 ENCOUNTER — Encounter (HOSPITAL_COMMUNITY): Payer: Self-pay

## 2012-02-03 ENCOUNTER — Other Ambulatory Visit: Payer: Self-pay | Admitting: Obstetrics and Gynecology

## 2012-02-03 ENCOUNTER — Encounter (HOSPITAL_COMMUNITY)
Admission: RE | Admit: 2012-02-03 | Discharge: 2012-02-03 | Disposition: A | Discharge status: Home / Self Care | Payer: BC Managed Care – PPO | Source: Ambulatory Visit | Attending: Obstetrics and Gynecology | Admitting: Obstetrics and Gynecology

## 2012-02-03 HISTORY — DX: Cardiac murmur, unspecified: R01.1

## 2012-02-03 HISTORY — DX: Hypothyroidism, unspecified: E03.9

## 2012-02-03 LAB — BASIC METABOLIC PANEL
CO2: 27 mEq/L (ref 19–32)
Calcium: 8.9 mg/dL (ref 8.4–10.5)
Creatinine, Ser: 0.66 mg/dL (ref 0.50–1.10)
GFR calc non Af Amer: >90 mL/min (ref >90)

## 2012-02-03 LAB — CBC
MCH: 27 pg (ref 26.0–34.0)
MCHC: 32.1 g/dL (ref 30.0–36.0)
MCV: 84.2 fL (ref 78.0–100.0)
Platelets: 285 10*3/uL (ref 150–400)
RBC: 4.48 MIL/uL (ref 3.87–5.11)
RDW: 14.1 % (ref 11.5–15.5)

## 2012-02-03 LAB — SURGICAL PCR SCREEN: MRSA, PCR: NEGATIVE

## 2012-02-03 NOTE — Patient Instructions (Addendum)
YOUR PROCEDURE IS SCHEDULED ON:02/10/12  ENTER THROUGH THE MAIN ENTRANCE OF [facility] AT:1130am  USE DESK PHONE AND DIAL [id number] TO INFORM us OF YOUR ARRIVAL  CALL [phone] IF YOU HAVE ANY QUESTIONS OR PROBLEMS PRIOR TO YOUR ARRIVAL.  REMEMBER: DO NOT EAT AFTER MIDNIGHT : Tuesday  SPECIAL INSTRUCTIONS:clear liquids ok until 9am Wed   YOU MAY BRUSH YOUR TEETH THE MORNING OF SURGERY   TAKE THESE MEDICINES THE DAY OF SURGERY WITH SIP OF WATER: Carvedilol, HCTZ, Thyroid med, BCP   DO NOT WEAR JEWELRY, EYE MAKEUP, LIPSTICK OR DARK FINGERNAIL POLISH DO NOT WEAR LOTIONS  DO NOT SHAVE FOR 48 HOURS PRIOR TO SURGERY

## 2012-02-04 ENCOUNTER — Encounter (HOSPITAL_COMMUNITY): Payer: Self-pay

## 2012-02-04 ENCOUNTER — Ambulatory Visit (INDEPENDENT_AMBULATORY_CARE_PROVIDER_SITE_OTHER): Payer: BC Managed Care – PPO | Admitting: Family Medicine

## 2012-02-04 ENCOUNTER — Encounter: Payer: Self-pay | Admitting: Family Medicine

## 2012-02-04 VITALS — BP 156/94 | HR 86 | Temp 98.3°F | Wt 252.4 lb

## 2012-02-04 DIAGNOSIS — I10 Essential (primary) hypertension: Secondary | ICD-10-CM

## 2012-02-04 MED ORDER — CARVEDILOL PHOSPHATE ER 20 MG PO CP24: 20.0000 mg | ORAL_CAPSULE | Freq: Every day | ORAL | Status: DC

## 2012-02-04 NOTE — Pre-Procedure Instructions (Signed)
Per Dr. Rodman Pickle, pt referred to her PMD re high BP. Dr. Rodman Pickle said pt is ok for surgery but would like to see her BP lower.

## 2012-02-04 NOTE — Assessment & Plan Note (Signed)
Change to coreg cr 20 mg 1 po qd rto 2-3 weeks.

## 2012-02-04 NOTE — Patient Instructions (Signed)

## 2012-02-04 NOTE — Progress Notes (Signed)
  Subjective:    Patient ID: Kayla Banks, female    DOB: 01/15/1956, 56 y.o.   MRN: [id number]  HPI Pt here at request of anaesthesiologist.  Her bp was high at preop eval.  No other complaints.  She is scheduled for TAH BSO next week with DiVinci robot.   Review of Systems as above   Objective:   Physical Exam  Constitutional: She is oriented to person, place, and time. She appears well-developed and well-nourished.  Cardiovascular: Normal rate and regular rhythm.   Pulmonary/Chest: Effort normal and breath sounds normal.  Musculoskeletal: She exhibits no edema and no tenderness.  Neurological: She is alert and oriented to person, place, and time.  Psychiatric: She has a normal mood and affect. Her behavior is normal. Judgment and thought content normal.          Assessment & Plan:

## 2012-02-08 ENCOUNTER — Other Ambulatory Visit: Payer: Self-pay | Admitting: Obstetrics and Gynecology

## 2012-02-09 MED ORDER — CIPROFLOXACIN IN D5W 400 MG/200ML IV SOLN
400.0000 mg | INTRAVENOUS | Status: AC
  Administered 2012-02-10: 400 mg via INTRAVENOUS
  Filled 2012-02-09: qty 200

## 2012-02-09 MED ORDER — CLINDAMYCIN PHOSPHATE 900 MG/50ML IV SOLN
900.0000 mg | INTRAVENOUS | Status: AC
  Administered 2012-02-10: 900 mg via INTRAVENOUS
  Filled 2012-02-09: qty 50

## 2012-02-10 ENCOUNTER — Inpatient Hospital Stay (HOSPITAL_COMMUNITY): Payer: BC Managed Care – PPO | Admitting: Anesthesiology

## 2012-02-10 ENCOUNTER — Encounter (HOSPITAL_COMMUNITY): Payer: Self-pay | Admitting: *Deleted

## 2012-02-10 ENCOUNTER — Encounter (HOSPITAL_COMMUNITY): Payer: Self-pay | Admitting: Anesthesiology

## 2012-02-10 ENCOUNTER — Ambulatory Visit (HOSPITAL_COMMUNITY)
Admission: RE | Admit: 2012-02-10 | Discharge: 2012-02-11 | Disposition: A | Discharge status: Home / Self Care | Payer: BC Managed Care – PPO | Source: Ambulatory Visit | Attending: Obstetrics and Gynecology | Admitting: Obstetrics and Gynecology

## 2012-02-10 ENCOUNTER — Encounter (HOSPITAL_COMMUNITY)
Admission: RE | Disposition: A | Discharge status: Home / Self Care | Payer: Self-pay | Source: Ambulatory Visit | Attending: Obstetrics and Gynecology

## 2012-02-10 DIAGNOSIS — Z90710 Acquired absence of both cervix and uterus: Secondary | ICD-10-CM

## 2012-02-10 DIAGNOSIS — IMO0002 Reserved for concepts with insufficient information to code with codable children: Secondary | ICD-10-CM | POA: Insufficient documentation

## 2012-02-10 DIAGNOSIS — E039 Hypothyroidism, unspecified: Secondary | ICD-10-CM | POA: Insufficient documentation

## 2012-02-10 DIAGNOSIS — I10 Essential (primary) hypertension: Secondary | ICD-10-CM | POA: Insufficient documentation

## 2012-02-10 DIAGNOSIS — Y9210 Unspecified residential institution as the place of occurrence of the external cause: Secondary | ICD-10-CM | POA: Insufficient documentation

## 2012-02-10 DIAGNOSIS — N920 Excessive and frequent menstruation with regular cycle: Principal | ICD-10-CM | POA: Insufficient documentation

## 2012-02-10 DIAGNOSIS — D219 Benign neoplasm of connective and other soft tissue, unspecified: Secondary | ICD-10-CM

## 2012-02-10 DIAGNOSIS — D259 Leiomyoma of uterus, unspecified: Secondary | ICD-10-CM | POA: Insufficient documentation

## 2012-02-10 LAB — PREGNANCY, URINE: Preg Test, Ur: NEGATIVE

## 2012-02-10 LAB — BASIC METABOLIC PANEL
Calcium: 10 mg/dL (ref 8.4–10.5)
GFR calc Af Amer: >90 mL/min (ref >90)
GFR calc non Af Amer: >90 mL/min (ref >90)
Glucose, Bld: 82 mg/dL (ref 70–99)
Sodium: 139 mEq/L (ref 135–145)

## 2012-02-10 LAB — CBC
MCH: 27.6 pg (ref 26.0–34.0)
MCHC: 32.7 g/dL (ref 30.0–36.0)
Platelets: 317 10*3/uL (ref 150–400)
RBC: 5.04 MIL/uL (ref 3.87–5.11)

## 2012-02-10 SURGERY — ROBOTIC ASSISTED TOTAL HYSTERECTOMY
Anesthesia: General | Wound class: Clean Contaminated

## 2012-02-10 MED ORDER — LACTATED RINGERS IV SOLN
INTRAVENOUS | Status: DC
  Administered 2012-02-10 (×3): via INTRAVENOUS
  Administered 2012-02-10: 125 mL/h via INTRAVENOUS

## 2012-02-10 MED ORDER — MIDAZOLAM HCL 5 MG/5ML IJ SOLN
INTRAMUSCULAR | Status: DC | PRN
  Administered 2012-02-10: 2 mg via INTRAVENOUS

## 2012-02-10 MED ORDER — ROCURONIUM BROMIDE 50 MG/5ML IV SOLN
INTRAVENOUS | Status: AC
  Filled 2012-02-10: qty 1

## 2012-02-10 MED ORDER — PROPOFOL 10 MG/ML IV EMUL
INTRAVENOUS | Status: AC
  Filled 2012-02-10: qty 20

## 2012-02-10 MED ORDER — FENTANYL CITRATE 0.05 MG/ML IJ SOLN
25.0000 ug | INTRAMUSCULAR | Status: DC | PRN
  Administered 2012-02-10 (×2): 50 ug via INTRAVENOUS

## 2012-02-10 MED ORDER — IBUPROFEN 600 MG PO TABS
600.0000 mg | ORAL_TABLET | Freq: Four times a day (QID) | ORAL | Status: DC | PRN
  Administered 2012-02-11: 600 mg via ORAL
  Filled 2012-02-10: qty 1

## 2012-02-10 MED ORDER — ONDANSETRON HCL 4 MG/2ML IJ SOLN
INTRAMUSCULAR | Status: DC | PRN
  Administered 2012-02-10: 4 mg via INTRAVENOUS

## 2012-02-10 MED ORDER — PROPOFOL 10 MG/ML IV EMUL
INTRAVENOUS | Status: DC | PRN
  Administered 2012-02-10: 50 mg via INTRAVENOUS
  Administered 2012-02-10: 180 mg via INTRAVENOUS
  Administered 2012-02-10: 20 mg via INTRAVENOUS
  Administered 2012-02-10: 50 mg via INTRAVENOUS

## 2012-02-10 MED ORDER — NEOSTIGMINE METHYLSULFATE 1 MG/ML IJ SOLN
INTRAMUSCULAR | Status: DC | PRN
  Administered 2012-02-10: 3 mg via INTRAVENOUS

## 2012-02-10 MED ORDER — LEVOTHYROXINE SODIUM 25 MCG PO TABS
25.0000 ug | ORAL_TABLET | Freq: Every day | ORAL | Status: DC
  Administered 2012-02-11: 25 ug via ORAL
  Filled 2012-02-10 (×2): qty 1

## 2012-02-10 MED ORDER — NEOSTIGMINE METHYLSULFATE 1 MG/ML IJ SOLN
INTRAMUSCULAR | Status: AC
  Filled 2012-02-10: qty 10

## 2012-02-10 MED ORDER — CARVEDILOL PHOSPHATE ER 20 MG PO CP24
20.0000 mg | ORAL_CAPSULE | Freq: Every day | ORAL | Status: DC
  Administered 2012-02-11: 20 mg via ORAL
  Filled 2012-02-10 (×2): qty 1

## 2012-02-10 MED ORDER — HYDROCHLOROTHIAZIDE 25 MG PO TABS
25.0000 mg | ORAL_TABLET | Freq: Every day | ORAL | Status: DC
  Administered 2012-02-11: 25 mg via ORAL
  Filled 2012-02-10 (×2): qty 1

## 2012-02-10 MED ORDER — LACTATED RINGERS IV SOLN
INTRAVENOUS | Status: DC
  Administered 2012-02-11: 02:00:00 via INTRAVENOUS

## 2012-02-10 MED ORDER — FENTANYL CITRATE 0.05 MG/ML IJ SOLN
INTRAMUSCULAR | Status: AC
  Filled 2012-02-10: qty 5

## 2012-02-10 MED ORDER — FENTANYL CITRATE 0.05 MG/ML IJ SOLN
INTRAMUSCULAR | Status: AC
  Administered 2012-02-10: 50 ug via INTRAVENOUS
  Filled 2012-02-10: qty 2

## 2012-02-10 MED ORDER — KETOROLAC TROMETHAMINE 30 MG/ML IJ SOLN
15.0000 mg | Freq: Once | INTRAMUSCULAR | Status: AC | PRN
  Administered 2012-02-10: 30 mg via INTRAVENOUS

## 2012-02-10 MED ORDER — LIDOCAINE HCL (CARDIAC) 20 MG/ML IV SOLN
INTRAVENOUS | Status: DC | PRN
  Administered 2012-02-10: 50 mg via INTRAVENOUS

## 2012-02-10 MED ORDER — ROPIVACAINE HCL 5 MG/ML IJ SOLN
INTRAMUSCULAR | Status: DC | PRN
  Administered 2012-02-10: 30 mL via EPIDURAL

## 2012-02-10 MED ORDER — MIDAZOLAM HCL 2 MG/2ML IJ SOLN: 0.5000 mg | Freq: Once | INTRAMUSCULAR | Status: DC | PRN

## 2012-02-10 MED ORDER — ACETAMINOPHEN 325 MG PO TABS: 325.0000 mg | ORAL_TABLET | ORAL | Status: DC | PRN

## 2012-02-10 MED ORDER — PHENYLEPHRINE 40 MCG/ML (10ML) SYRINGE FOR IV PUSH (FOR BLOOD PRESSURE SUPPORT)
PREFILLED_SYRINGE | INTRAVENOUS | Status: AC
  Filled 2012-02-10: qty 10

## 2012-02-10 MED ORDER — ARTIFICIAL TEARS OP OINT
TOPICAL_OINTMENT | OPHTHALMIC | Status: AC
  Filled 2012-02-10: qty 3.5

## 2012-02-10 MED ORDER — MIDAZOLAM HCL 2 MG/2ML IJ SOLN
INTRAMUSCULAR | Status: AC
  Filled 2012-02-10: qty 2

## 2012-02-10 MED ORDER — KETOROLAC TROMETHAMINE 30 MG/ML IJ SOLN
INTRAMUSCULAR | Status: AC
  Administered 2012-02-10: 30 mg via INTRAVENOUS
  Filled 2012-02-10: qty 1

## 2012-02-10 MED ORDER — GLYCOPYRROLATE 0.2 MG/ML IJ SOLN
INTRAMUSCULAR | Status: AC
  Filled 2012-02-10: qty 1

## 2012-02-10 MED ORDER — PROMETHAZINE HCL 25 MG/ML IJ SOLN
INTRAMUSCULAR | Status: AC
  Administered 2012-02-10: 6.25 mg via INTRAVENOUS
  Filled 2012-02-10: qty 1

## 2012-02-10 MED ORDER — ONDANSETRON HCL 4 MG/2ML IJ SOLN
INTRAMUSCULAR | Status: AC
  Filled 2012-02-10: qty 2

## 2012-02-10 MED ORDER — ROCURONIUM BROMIDE 100 MG/10ML IV SOLN
INTRAVENOUS | Status: DC | PRN
  Administered 2012-02-10: 40 mg via INTRAVENOUS
  Administered 2012-02-10 (×6): 10 mg via INTRAVENOUS

## 2012-02-10 MED ORDER — ROPIVACAINE HCL 5 MG/ML IJ SOLN
INTRAMUSCULAR | Status: AC
  Filled 2012-02-10: qty 60

## 2012-02-10 MED ORDER — GLYCOPYRROLATE 0.2 MG/ML IJ SOLN
INTRAMUSCULAR | Status: DC | PRN
  Administered 2012-02-10: 0.6 mg via INTRAVENOUS
  Administered 2012-02-10: 0.1 mg via INTRAVENOUS

## 2012-02-10 MED ORDER — PROMETHAZINE HCL 25 MG/ML IJ SOLN
6.2500 mg | INTRAMUSCULAR | Status: DC | PRN
  Administered 2012-02-10: 6.25 mg via INTRAVENOUS

## 2012-02-10 MED ORDER — MEPERIDINE HCL 25 MG/ML IJ SOLN: 6.2500 mg | INTRAMUSCULAR | Status: DC | PRN

## 2012-02-10 MED ORDER — DEXAMETHASONE SODIUM PHOSPHATE 10 MG/ML IJ SOLN
INTRAMUSCULAR | Status: DC | PRN
  Administered 2012-02-10: 10 mg via INTRAVENOUS

## 2012-02-10 MED ORDER — OXYCODONE-ACETAMINOPHEN 5-325 MG PO TABS
1.0000 | ORAL_TABLET | ORAL | Status: DC | PRN
  Administered 2012-02-11: 1 via ORAL
  Filled 2012-02-10: qty 1

## 2012-02-10 MED ORDER — LIDOCAINE HCL (CARDIAC) 20 MG/ML IV SOLN
INTRAVENOUS | Status: AC
  Filled 2012-02-10: qty 5

## 2012-02-10 MED ORDER — FENTANYL CITRATE 0.05 MG/ML IJ SOLN
INTRAMUSCULAR | Status: DC | PRN
  Administered 2012-02-10: 25 ug via INTRAVENOUS
  Administered 2012-02-10 (×3): 50 ug via INTRAVENOUS
  Administered 2012-02-10: 100 ug via INTRAVENOUS
  Administered 2012-02-10: 25 ug via INTRAVENOUS
  Administered 2012-02-10: 100 ug via INTRAVENOUS
  Administered 2012-02-10: 50 ug via INTRAVENOUS

## 2012-02-10 MED ORDER — PHENYLEPHRINE HCL 10 MG/ML IJ SOLN
INTRAMUSCULAR | Status: DC | PRN
  Administered 2012-02-10: 80 ug via INTRAVENOUS
  Administered 2012-02-10 (×4): 40 ug via INTRAVENOUS
  Administered 2012-02-10 (×2): 80 ug via INTRAVENOUS
  Administered 2012-02-10: 40 ug via INTRAVENOUS
  Administered 2012-02-10: 20 ug via INTRAVENOUS

## 2012-02-10 MED ORDER — PHENYLEPHRINE 40 MCG/ML (10ML) SYRINGE FOR IV PUSH (FOR BLOOD PRESSURE SUPPORT)
PREFILLED_SYRINGE | INTRAVENOUS | Status: AC
  Filled 2012-02-10: qty 5

## 2012-02-10 MED ORDER — DEXAMETHASONE SODIUM PHOSPHATE 10 MG/ML IJ SOLN
INTRAMUSCULAR | Status: AC
  Filled 2012-02-10: qty 1

## 2012-02-10 SURGICAL SUPPLY — 69 items
BAG URINE DRAINAGE (UROLOGICAL SUPPLIES) ×3 IMPLANT
BARRIER ADHS 3X4 INTERCEED (GAUZE/BANDAGES/DRESSINGS) ×3 IMPLANT
BRR ADH 4X3 ABS CNTRL BYND (GAUZE/BANDAGES/DRESSINGS) ×1
CABLE HIGH FREQUENCY MONO STRZ (ELECTRODE) ×3 IMPLANT
CATH FOLEY 3WAY  5CC 16FR (CATHETERS) ×1
CATH FOLEY 3WAY 5CC 16FR (CATHETERS) ×2 IMPLANT
CONT PATH 16OZ SNAP LID 3702 (MISCELLANEOUS) ×3 IMPLANT
COVER MAYO STAND STRL (DRAPES) ×3 IMPLANT
COVER TABLE BACK 60X90 (DRAPES) ×6 IMPLANT
COVER TIP SHEARS 8 DVNC (MISCELLANEOUS) ×2 IMPLANT
COVER TIP SHEARS 8MM DA VINCI (MISCELLANEOUS) ×1
DECANTER SPIKE VIAL GLASS SM (MISCELLANEOUS) ×3 IMPLANT
DERMABOND ADVANCED (GAUZE/BANDAGES/DRESSINGS) ×1
DERMABOND ADVANCED .7 DNX12 (GAUZE/BANDAGES/DRESSINGS) ×2 IMPLANT
DRAPE HUG U DISPOSABLE (DRAPE) ×3 IMPLANT
DRAPE LG THREE QUARTER DISP (DRAPES) ×6 IMPLANT
DRAPE MONITOR DA VINCI (DRAPE) IMPLANT
DRAPE WARM FLUID 44X44 (DRAPE) ×3 IMPLANT
ELECT REM PT RETURN 9FT ADLT (ELECTROSURGICAL) ×3
ELECTRODE REM PT RTRN 9FT ADLT (ELECTROSURGICAL) ×2 IMPLANT
EVACUATOR SMOKE 8.L (FILTER) ×3 IMPLANT
GAUZE VASELINE 3X9 (GAUZE/BANDAGES/DRESSINGS) IMPLANT
GLOVE BIO SURGEON STRL SZ 6.5 (GLOVE) ×9 IMPLANT
GLOVE BIO SURGEON STRL SZ7 (GLOVE) ×9 IMPLANT
GLOVE BIOGEL M 6.5 STRL (GLOVE) ×6 IMPLANT
GLOVE BIOGEL PI IND STRL 6.5 (GLOVE) ×4 IMPLANT
GLOVE BIOGEL PI IND STRL 7.0 (GLOVE) ×10 IMPLANT
GLOVE BIOGEL PI INDICATOR 6.5 (GLOVE) ×2
GLOVE BIOGEL PI INDICATOR 7.0 (GLOVE) ×5
GLOVE ECLIPSE 6.5 STRL STRAW (GLOVE) ×12 IMPLANT
GOWN STRL REIN XL XLG (GOWN DISPOSABLE) ×24 IMPLANT
KIT ACCESSORY DA VINCI DISP (KITS) ×1
KIT ACCESSORY DVNC DISP (KITS) ×2 IMPLANT
KIT DISP ACCESSORY 4 ARM (KITS) IMPLANT
NEEDLE INSUFFLATION 14GA 120MM (NEEDLE) ×3 IMPLANT
NEEDLE INSUFFLATION 14GA 150MM (NEEDLE) ×6 IMPLANT
OCCLUDER COLPOPNEUMO (BALLOONS) IMPLANT
PACK LAVH (CUSTOM PROCEDURE TRAY) ×3 IMPLANT
PAD PREP 24X48 CUFFED NSTRL (MISCELLANEOUS) ×6 IMPLANT
PLUG CATH AND CAP STER (CATHETERS) ×3 IMPLANT
PROTECTOR NERVE ULNAR (MISCELLANEOUS) ×6 IMPLANT
SET CYSTO W/LG BORE CLAMP LF (SET/KITS/TRAYS/PACK) ×3 IMPLANT
SET IRRIG TUBING LAPAROSCOPIC (IRRIGATION / IRRIGATOR) ×3 IMPLANT
SOLUTION ELECTROLUBE (MISCELLANEOUS) ×3 IMPLANT
SPONGE LAP 18X18 X RAY DECT (DISPOSABLE) IMPLANT
SUT VIC AB 0 CT1 27 (SUTURE) ×1
SUT VIC AB 0 CT1 27XBRD ANBCTR (SUTURE) ×2 IMPLANT
SUT VIC AB 3-0 SH 27 (SUTURE) ×2
SUT VIC AB 3-0 SH 27X BRD (SUTURE) ×4 IMPLANT
SUT VICRYL 0 27 CT2 27 ABS (SUTURE) ×15 IMPLANT
SUT VICRYL 0 UR6 27IN ABS (SUTURE) ×3 IMPLANT
SUT VICRYL RAPIDE 4/0 PS 2 (SUTURE) ×9 IMPLANT
SYR 30ML LL (SYRINGE) ×3 IMPLANT
SYR 50ML LL SCALE MARK (SYRINGE) ×3 IMPLANT
SYSTEM CONVERTIBLE TROCAR (TROCAR) IMPLANT
TIP UTERINE 5.1X6CM LAV DISP (MISCELLANEOUS) IMPLANT
TIP UTERINE 6.7X10CM GRN DISP (MISCELLANEOUS) IMPLANT
TIP UTERINE 6.7X6CM WHT DISP (MISCELLANEOUS) IMPLANT
TIP UTERINE 6.7X8CM BLUE DISP (MISCELLANEOUS) ×3 IMPLANT
TOWEL OR 17X24 6PK STRL BLUE (TOWEL DISPOSABLE) ×9 IMPLANT
TROCAR 12M 150ML BLUNT (TROCAR) ×3 IMPLANT
TROCAR DISP BLADELESS 8 DVNC (TROCAR) ×2 IMPLANT
TROCAR DISP BLADELESS 8MM (TROCAR) ×1
TROCAR XCEL 12X100 BLDLESS (ENDOMECHANICALS) ×3 IMPLANT
TROCAR XCEL NON-BLD 11X100MML (ENDOMECHANICALS) ×3 IMPLANT
TROCAR Z-THREAD 12X150 (TROCAR) ×3 IMPLANT
TUBING FILTER THERMOFLATOR (ELECTROSURGICAL) ×3 IMPLANT
WARMER LAPAROSCOPE (MISCELLANEOUS) ×3 IMPLANT
WATER STERILE IRR 1000ML POUR (IV SOLUTION) ×9 IMPLANT

## 2012-02-10 NOTE — Op Note (Signed)
02/10/2012  8:14 PM  PATIENT:  Kayla Banks  56 y.o. female  PRE-OPERATIVE DIAGNOSIS: 1 Menorrhagia 2 Uterine Fibroids   POST-OPERATIVE DIAGNOSIS: 1 Menorrhagia 2 Uterine fibroids   PROCEDURE:  Procedure(s) (LRB): ROBOTIC ASSISTED TOTAL HYSTERECTOMY (N/A) ROBOTIC ASSISTED BILATERAL SALPINGO OOPHERECTOMY (Bilateral)  repair of right vaginal laceration   SURGEON:  Surgeon(s) and Role:    * Jadden Yim J. Richardson Dopp, MD - Primary    * Geryl Rankins, MD - Assisting  PHYSICIAN ASSISTANT: None   ASSISTANTS: Dr Geryl Rankins    ANESTHESIA:   general  EBL:  Total I/O In: 900 [I.V.:900] Out: 20 [Urine:20]   Findings :  Large fibroid uterus with multiple pedunculated fibroids. Normal Fallopian Tubes and Ovaries   BLOOD ADMINISTERED:none  DRAINS: Urinary Catheter (Foley)   LOCAL MEDICATIONS USED:  OTHER Ropivicaine   SPECIMEN:  Source of Specimen:  Uterus/ Cervix / Bilateral Fallopian Tubes and Ovaries   DISPOSITION OF SPECIMEN:  PATHOLOGY  COUNTS:  YES  TOURNIQUET:  * No tourniquets in log *  DICTATION: .Dragon Dictation  PLAN OF CARE: Admit for overnight observation  PATIENT DISPOSITION:  PACU - hemodynamically stable.   Delay start of Pharmacological VTE agent (>24hrs) due to surgical blood loss or risk of bleeding: not applicable  Indication: This is a 56 year old G0 with menorrhagia due to  uterine fibroids. She desires definitive therapy.   Procedure: The  patient was taken to the operating room where she was placed under general anesthesia. She was placed in dorsal lithotomy position and prepped and draped in the usual sterile fashion. A weighted speculum was placed into the vagina.  A Deaver was placed anteriorly for retraction. The anterior lip of the cervix was grasped with a single-tooth tenaculum. The vaginal mucosa was injected with 2.5 cc of ropivacaine at the 2/4/ 8 and 10 oclock  positions. The uterus was sounded to 8 cm the cervix was dilated to 6 mm . 0 vicryl  sutrure  placed at the 12 and 6:00 positions  Of the cervix to facilitate placement of a Rumi  uterine  manipulator. The manipulator was placed without difficulty. Weighted speculum and Deaver were removed .  Attention was turned to the patient's abdomen where a  12 mm skin incision was made 4 cm above the umbilicus..  A 12 mm trocar was placed under direct visualization . The pneumoperitoneum  was achieved with CO2 gas.  The laparoscope was removed. 60 cc of ropivacaine were injected into the abdominal cavity. The laparoscope  was reinserted. An  8mm incision was made in the right upper quadrant and an 8 mm   trocar was placed 12 centimeters from the umbilicus.later connected to robotic arm #1). An incision was made in there  left upper quadrant TROCAR WAS PLACED 16 cm from the umbilicus. Later connected to robotic arm #2.  Attention was turned to the right upper quadrant where a 11 mm midclavicular assistant  trocar was placed. ( All incision sites were injected with 10cc of ropivicaine prior to port placement. )  Once all ports had been placed under direct visualization.The laparoscope was removed and the da Vinci robotic system was thin right-sided docked.  The robotic  arms were connected to the corresponding trocars as listed above. The laparoscope  was then reinserted.  The PK bipolar cautery was placed into port #1. The monopolar scissor placed in the port #2. All instruments were directed into the pelvis under direct visualization.  Attention  was turned to the surgeons  console.. The left utero-ovarian ligament was cauterized with PK and excised with scissors. The broad ligament was cauterized with PK incised with scissors. The round ligament was cauterized with the PK incised with scissors. The anterior leaf of broad ligament was incised along the bladder reflection to the midline.  The right  utero-ovarian ligament was cauterized with PK and excised with scissors. The right broad ligament was  cauterized with PK excised scissors. The right round ligament was cauterized with PK and excised with scissors. The   broad ligament was incised  to the midline. The bladder was dissected off the lower uterine segments of the cervix via sharp and blunt dissection.  The uterine arteries were skeletonized bilaterally. They were cauterized with PK and transected. The KOH ring  was identified.  The anterior colpotomy was performed followed by the posterior colpotomy. Once the uterus and cervix were completely excised They were removed through the vagina. Due to the size of there uterus the cervix and uterus were morcellated vaginally to facilitate removal of there specimen from the vagina.    Attention was turned to there right fallopian tube and ovary . The right ureter was identified. The right  Infundibulopelvic ligament was cauterized with the pK and then excised with laparoscopic scissors. Attention was then turned to the left fallopian tube and ovary. There left ureter was identified  The right and left  fallopian tube and ovaries   were removed through  the vagina and sent to pathology.The pk and scissors were removed and log tip forceps were   placed in the port #1 and the cutting needle driver was placed in to port #2. The vaginal cuff angles were closed with figure-of-eight stitches of 0 Vicryl. The remainder of the vaginal cuff was closed with interrupted 0 Vicryl Vicryl figure-of-eight sutures. The pelvis was irrigated.  Pt was noted to have bleeding from the right vaginal cuff angle .Marland Kitchen Hemostasis was obtained with placement of a figure of eight 0 vicryl stitch . Marland Kitchen.Excellent hemostasis was noted. All pelvic pedicles were examined and hemostasis was noted. Interseed was placed along the vaginal cuff. All instuments  removed from the ports.  All ports were removed under direct  Visualization. The  pneumoperitoneum was released.  The fascia of the 12 mm umbilical port was closed with 0 Vicryl and  the  fascia the 11 mm port was closed with 0 Vicryl. The skin incisions were closed with 4-0 Vicryl and then covered with Dermabond.   The perineum was examined and the patient was noted to have  A small right vaginal laceration. This was repaired with 3-0 vicryl .   Sponge lap and needle counts were correct x2.  The patient was awakened from anesthesia and taken to the recovery room in stable condition.

## 2012-02-10 NOTE — Anesthesia Preprocedure Evaluation (Signed)
Anesthesia Evaluation  Patient identified by MRN, date of birth, ID band Patient awake    Reviewed: Allergy & Precautions, H&P , Patient's Chart, lab work & pertinent test results, reviewed documented beta blocker date and time   History of Anesthesia Complications Negative for: history of anesthetic complications  Airway Mallampati: III TM Distance: >3 FB Neck ROM: full    Dental No notable dental hx.    Pulmonary neg pulmonary ROS,  breath sounds clear to auscultation  Pulmonary exam normal       Cardiovascular Exercise Tolerance: Good hypertension, negative cardio ROS  + Valvular Problems/Murmurs Rhythm:regular Rate:Normal     Neuro/Psych negative neurological ROS  negative psych ROS   GI/Hepatic negative GI ROS, Neg liver ROS,   Endo/Other  negative endocrine ROSHypothyroidism Morbid obesity  Renal/GU negative Renal ROS     Musculoskeletal   Abdominal   Peds  Hematology negative hematology ROS (+)   Anesthesia Other Findings   Reproductive/Obstetrics negative OB ROS                           Anesthesia Physical Anesthesia Plan  ASA: III  Anesthesia Plan: General ETT   Post-op Pain Management:    Induction:   Airway Management Planned:   Additional Equipment:   Intra-op Plan:   Post-operative Plan:   Informed Consent: I have reviewed the patients History and Physical, chart, labs and discussed the procedure including the risks, benefits and alternatives for the proposed anesthesia with the patient or authorized representative who has indicated his/her understanding and acceptance.   Dental Advisory Given  Plan Discussed with: CRNA and Surgeon  Anesthesia Plan Comments:         Anesthesia Quick Evaluation

## 2012-02-10 NOTE — Transfer of Care (Signed)
Immediate Anesthesia Transfer of Care Note  Patient: Kayla Banks  Procedure(s) Performed: Procedure(s) (LRB): ROBOTIC ASSISTED TOTAL HYSTERECTOMY (N/A) ROBOTIC ASSISTED BILATERAL SALPINGO OOPHERECTOMY (Bilateral)  Patient Location: PACU  Anesthesia Type: General  Level of Consciousness: awake  Airway & Oxygen Therapy: Patient Spontanous Breathing and Patient connected to nasal cannula oxygen  Post-op Assessment: Report given to PACU RN and Post -op Vital signs reviewed and stable  Post vital signs: stable  Complications: No apparent anesthesia complications

## 2012-02-10 NOTE — H&P (Signed)
Date of Initial H&P: 02/03/2012  History reviewed, patient examined, Her coreg was increased from 6.25mg  to 20 mg ortherwise no change in status, stable for surgery.

## 2012-02-10 NOTE — Anesthesia Postprocedure Evaluation (Signed)
  Anesthesia Post-op Note  Patient: Kayla Banks  Procedure(s) Performed: Procedure(s) (LRB): ROBOTIC ASSISTED TOTAL HYSTERECTOMY (N/A) ROBOTIC ASSISTED BILATERAL SALPINGO OOPHERECTOMY (Bilateral)  Patient is awake and responsive. Slight hoarseness.  Pain and nausea are reasonably well controlled. Vital signs are stable and clinically acceptable. Pt moves all ext. well w/o difficulty. Oxygen saturation is clinically acceptable. There are no apparent anesthetic complications at this time. Patient is ready for discharge.

## 2012-02-11 ENCOUNTER — Other Ambulatory Visit: Payer: Self-pay | Admitting: Family Medicine

## 2012-02-11 LAB — CBC
HCT: 38.5 % (ref 36.0–46.0)
MCV: 84.1 fL (ref 78.0–100.0)
RBC: 4.58 MIL/uL (ref 3.87–5.11)
WBC: 15 10*3/uL — ABNORMAL HIGH (ref 4.0–10.5)

## 2012-02-11 MED ORDER — IBUPROFEN 600 MG PO TABS: 600.0000 mg | ORAL_TABLET | Freq: Four times a day (QID) | ORAL | Status: AC | PRN

## 2012-02-11 MED ORDER — OXYCODONE-ACETAMINOPHEN 5-325 MG PO TABS: 1.0000 | ORAL_TABLET | ORAL | Status: AC | PRN

## 2012-02-11 NOTE — Progress Notes (Signed)
Subjective: Patient reports tolerating PO. Pain is well controlled    foley is still in place. No flatus. No nausea or emesis   Objective: I have reviewed patient's vital signs, intake and output and labs.  General: alert and cooperative GI: soft, non-tender; bowel sounds normal; no masses,  no organomegaly Extremities: extremities normal, atraumatic, no cyanosis or edema   Assessment/Plan: POD #1 s/p robotic assisted laparoscopic hysterectomy with bso Pt is doing well... If tolerates breakfast  Reports flatus and voids may d/c home today   LOS: 1 day    Nolyn Swab J. 02/11/2012, 6:02 AM

## 2012-02-11 NOTE — Anesthesia Postprocedure Evaluation (Signed)
  Anesthesia Post-op Note  Patient: Kayla Banks  Procedure(s) Performed: Procedure(s) (LRB): ROBOTIC ASSISTED TOTAL HYSTERECTOMY (N/A) ROBOTIC ASSISTED BILATERAL SALPINGO OOPHERECTOMY (Bilateral)  Patient Location: PACU and Women's Unit  Anesthesia Type: General  Level of Consciousness: awake, alert  and oriented  Airway and Oxygen Therapy: Patient Spontanous Breathing  Post-op Pain: none  Post-op Assessment: Post-op Vital signs reviewed and Patient's Cardiovascular Status Stable  Post-op Vital Signs: Reviewed and stable  Complications: No apparent anesthesia complications

## 2012-02-11 NOTE — Addendum Note (Signed)
Addendum  created 02/11/12 0954 by Shanon Payor, CRNA   Modules edited:Notes Section

## 2012-02-11 NOTE — Discharge Summary (Signed)
Physician Discharge Summary  Patient ID: Kayla Banks MRN: [id number] DOB/AGE: Apr 04, 1956 56 y.o.  Admit date: 02/10/2012 Discharge date: 02/11/2012  Admission Diagnoses:1. Uterine fibroids 2 menorrhagia 3 hypertension 4hypothyroidism   Discharge Diagnoses:  Same + s/p robotic assisted laparoscopic vaginal hysterectomy  Active Problems:  * No active hospital problems. *    Discharged Condition: stable  Hospital Course: Pt was placed in observation after robotic assisted laparoscopic vaginal hysterectomy/ BSO . She did well postoperatively. UOP was adequate and vital signs remained stable Pain is well controlled   Consults: None  Significant Diagnostic Studies: labs : Hgb stable   Treatments: surgery: robotic assisted laparoscopic vaginal hysterectomy BSO  Discharge Exam: Blood pressure 134/75, pulse 102, temperature 97.5 F (36.4 C), temperature source Oral, resp. rate 18, height 5\' 6"  (1.676 m), weight 114.488 kg (252 lb 6.4 oz), SpO2 98.00%. General appearance: alert and cooperative GI: soft, non-tender; bowel sounds normal; no masses,  no organomegaly Extremities: extremities normal, atraumatic, no cyanosis or edema  Disposition: Final discharge disposition not confirmed  Discharge Orders    Future Appointments: Provider: Department: Dept Phone: Center:   02/23/2012 4:00 PM Lelon Perla, DO [facility] [phone] [facility]     Future Orders Please Complete By Expires   Diet - low sodium heart healthy      Increase activity slowly      Driving Restrictions      Comments:   Avoid driving for 1 wk   Lifting restrictions      Comments:   Do not lift over 10 lbs   Sexual Activity Restrictions      Comments:   Avoid sex for 8 wks   Call MD for:  temperature >100.4      Call MD for:  persistant nausea and vomiting      Call MD for:  severe uncontrolled pain        Medication List  As of 02/11/2012  6:10 AM   TAKE these medications         carvedilol 20 MG 24  hr capsule   Commonly known as: COREG CR   Take 1 capsule (20 mg total) by mouth daily.      carvedilol 6.25 MG tablet   Commonly known as: COREG   TAKE 1 TABLET BY MOUTH TWICE DAILY WITH A MEAL      cyclobenzaprine 5 MG tablet   Commonly known as: FLEXERIL   Take 5 mg by mouth 2 (two) times daily as needed. For muscle spasms      EPIPEN 2-PAK 0.3 mg/0.3 mL Devi   Generic drug: EPINEPHrine   Inject 0.3 mg into the muscle as needed. For allergic reaction      hydrochlorothiazide 25 MG tablet   Commonly known as: HYDRODIURIL   TAKE 1 TABLET BY MOUTH DAILY      HYDROcodone-acetaminophen 5-500 MG per tablet   Commonly known as: VICODIN   Take 1 tablet by mouth every 12 (twelve) hours as needed. For pain      ibuprofen 600 MG tablet   Commonly known as: ADVIL,MOTRIN   Take 1 tablet (600 mg total) by mouth every 6 (six) hours as needed (mild pain).      JOLIVETTE 0.35 MG tablet   Generic drug: norethindrone   Take 1 tablet by mouth daily.      levothyroxine 25 MCG tablet   Commonly known as: SYNTHROID, LEVOTHROID   TAKE ONE TABLET BY MOUTH DAILY      meloxicam 15 MG  tablet   Commonly known as: MOBIC   Take 15 mg by mouth daily as needed. For pain      meperidine 50 MG tablet   Commonly known as: DEMEROL   Take 50-100 mg by mouth every 6 (six) hours as needed. For pain      oxyCODONE-acetaminophen 5-325 MG per tablet   Commonly known as: PERCOCET   Take 1-2 tablets by mouth every 4 (four) hours as needed (moderate to severe pain (when tolerating fluids)).      promethazine 25 MG tablet   Commonly known as: PHENERGAN   Take 25-50 mg by mouth every 6 (six) hours as needed. For nausea      traMADol 50 MG tablet   Commonly known as: ULTRAM   Take 50-100 mg by mouth every 12 (twelve) hours as needed. For pain      valACYclovir 1000 MG tablet   Commonly known as: VALTREX   Take 2,000 mg by mouth as directed. Take at first sign of of cold sore           Follow-up  Information    Follow up with Jessee Avers., MD. Schedule an appointment as soon as possible for a visit in 2 weeks. (pt may already have an appointment )    Contact information:   [address] [place] [address] [phone]          Signed: Jessee Avers. 02/11/2012, 6:10 AM

## 2012-02-15 HISTORY — PX: ABDOMINAL HYSTERECTOMY: SHX81

## 2012-02-23 ENCOUNTER — Encounter: Payer: Self-pay | Admitting: Family Medicine

## 2012-02-23 ENCOUNTER — Ambulatory Visit (INDEPENDENT_AMBULATORY_CARE_PROVIDER_SITE_OTHER): Payer: BC Managed Care – PPO | Admitting: Family Medicine

## 2012-02-23 VITALS — BP 134/86 | HR 75 | Temp 98.3°F | Wt 242.0 lb

## 2012-02-23 DIAGNOSIS — I10 Essential (primary) hypertension: Secondary | ICD-10-CM

## 2012-02-23 MED ORDER — AMLODIPINE BESYLATE 5 MG PO TABS: 5.0000 mg | ORAL_TABLET | Freq: Every day | ORAL | Status: DC

## 2012-02-23 NOTE — Patient Instructions (Addendum)

## 2012-02-23 NOTE — Progress Notes (Signed)
  Subjective:    Patient here for follow-up of elevated blood pressure.  She is not exercising and is adherent to a low-salt diet.  Blood pressure is not well controlled at home. Cardiac symptoms: none. Patient denies: chest pain, chest pressure/discomfort, claudication, dyspnea, exertional chest pressure/discomfort, fatigue, irregular heart beat, lower extremity edema, near-syncope, orthopnea, palpitations, paroxysmal nocturnal dyspnea, syncope and tachypnea. Cardiovascular risk factors: hypertension, obesity (BMI >= 30 kg/m2) and sedentary lifestyle. Use of agents associated with hypertension: none. History of target organ damage: none.  The following portions of the patient's history were reviewed and updated as appropriate: allergies, current medications, past family history, past medical history, past social history, past surgical history and problem list.  Review of Systems Pertinent items are noted in HPI.     Objective:    BP 134/86  Pulse 75  Temp 98.3 F (36.8 C) (Oral)  Wt 242 lb (109.77 kg)  SpO2 98% General appearance: alert, cooperative, appears stated age and no distress Lungs: clear to auscultation bilaterally Heart: S1, S2 normal Extremities: extremities normal, atraumatic, no cyanosis or edema    Assessment:    Hypertension, stage 1 . Evidence of target organ damage: none.    Plan:    Medication: continue coreg and begin norvasc. Dietary sodium restriction. Regular aerobic exercise. Check blood pressures 2-3 times weekly and record. Follow up: 3 weeks and as needed.

## 2012-03-09 ENCOUNTER — Other Ambulatory Visit: Payer: Self-pay | Admitting: Family Medicine

## 2012-03-15 ENCOUNTER — Ambulatory Visit (INDEPENDENT_AMBULATORY_CARE_PROVIDER_SITE_OTHER): Payer: BC Managed Care – PPO | Admitting: Family Medicine

## 2012-03-15 ENCOUNTER — Encounter: Payer: Self-pay | Admitting: Family Medicine

## 2012-03-15 VITALS — BP 144/82 | HR 68 | Temp 98.6°F | Wt 248.0 lb

## 2012-03-15 DIAGNOSIS — E785 Hyperlipidemia, unspecified: Secondary | ICD-10-CM

## 2012-03-15 DIAGNOSIS — E039 Hypothyroidism, unspecified: Secondary | ICD-10-CM

## 2012-03-15 DIAGNOSIS — I10 Essential (primary) hypertension: Secondary | ICD-10-CM

## 2012-03-15 NOTE — Progress Notes (Signed)
  Subjective:    Patient here for follow-up of elevated blood pressure.  She is not exercising and is adherent to a low-salt diet.  Blood pressure is not checked at home-- she said her [facility] has nurses on duty and they can check it for her . Cardiac symptoms: none. Patient denies: chest pain, chest pressure/discomfort, claudication, dyspnea, exertional chest pressure/discomfort, fatigue, irregular heart beat, lower extremity edema, near-syncope, orthopnea, palpitations, paroxysmal nocturnal dyspnea, syncope and tachypnea. Cardiovascular risk factors: hypertension, obesity (BMI >= 30 kg/m2) and sedentary lifestyle. Use of agents associated with hypertension: none. History of target organ damage: none.  The following portions of the patient's history were reviewed and updated as appropriate: allergies, current medications, past family history, past medical history, past social history, past surgical history and problem list.  Review of Systems Pertinent items are noted in HPI.     Objective:    BP 144/82  Pulse 68  Temp 98.6 F (37 C) (Oral)  Wt 248 lb (112.492 kg)  SpO2 97% General appearance: alert, cooperative, appears stated age and no distress Lungs: clear to auscultation bilaterally Heart: S1, S2 normal Extremities: extremities normal, atraumatic, no cyanosis or edema    Assessment:    Hypertension, stage 1 . Evidence of target organ damage: none.    Plan:    Medication: no change and pt did not take med today and has been out of coreg for a few days. Dietary sodium restriction. Regular aerobic exercise. Check blood pressures 2-3 times weekly and record. Follow up: 6 months and as needed. ----have someone at church check your bp

## 2012-03-15 NOTE — Patient Instructions (Addendum)

## 2012-03-17 ENCOUNTER — Other Ambulatory Visit: Payer: Self-pay | Admitting: Family Medicine

## 2012-04-01 ENCOUNTER — Other Ambulatory Visit (INDEPENDENT_AMBULATORY_CARE_PROVIDER_SITE_OTHER): Payer: BC Managed Care – PPO

## 2012-04-01 DIAGNOSIS — I10 Essential (primary) hypertension: Secondary | ICD-10-CM

## 2012-04-01 DIAGNOSIS — E039 Hypothyroidism, unspecified: Secondary | ICD-10-CM

## 2012-04-01 DIAGNOSIS — E785 Hyperlipidemia, unspecified: Secondary | ICD-10-CM

## 2012-04-01 LAB — CBC WITH DIFFERENTIAL/PLATELET
Basophils Absolute: 0 10*3/uL (ref 0.0–0.1)
HCT: 38.6 % (ref 36.0–46.0)
Lymphs Abs: 1.8 10*3/uL (ref 0.7–4.0)
Monocytes Absolute: 0.5 10*3/uL (ref 0.1–1.0)
Monocytes Relative: 5.9 % (ref 3.0–12.0)
Platelets: 264 10*3/uL (ref 150.0–400.0)
RDW: 15.4 % — ABNORMAL HIGH (ref 11.5–14.6)

## 2012-04-01 LAB — HEPATIC FUNCTION PANEL
ALT: 13 U/L (ref 0–35)
AST: 16 U/L (ref 0–37)
Bilirubin, Direct: 0.1 mg/dL (ref 0.0–0.3)
Total Bilirubin: 0.4 mg/dL (ref 0.3–1.2)

## 2012-04-01 LAB — URINALYSIS
Bilirubin Urine: NEGATIVE
Hgb urine dipstick: NEGATIVE
Ketones, ur: NEGATIVE
Total Protein, Urine: NEGATIVE
Urine Glucose: NEGATIVE

## 2012-04-01 LAB — BASIC METABOLIC PANEL
BUN: 15 mg/dL (ref 6–23)
Creatinine, Ser: 0.6 mg/dL (ref 0.4–1.2)
GFR: 133.9 mL/min (ref >60.00)
Glucose, Bld: 84 mg/dL (ref 70–99)
Potassium: 3.9 mEq/L (ref 3.5–5.1)

## 2012-04-01 LAB — LIPID PANEL: HDL: 72.3 mg/dL (ref >39.00)

## 2012-04-01 LAB — TSH: TSH: 3.22 u[IU]/mL (ref 0.35–5.50)

## 2012-04-01 NOTE — Progress Notes (Signed)
Labs only

## 2012-04-12 ENCOUNTER — Other Ambulatory Visit: Payer: Self-pay | Admitting: Family Medicine

## 2012-04-26 ENCOUNTER — Encounter: Payer: Self-pay | Admitting: Family Medicine

## 2012-04-26 DIAGNOSIS — I10 Essential (primary) hypertension: Secondary | ICD-10-CM

## 2012-05-02 ENCOUNTER — Encounter: Payer: Self-pay | Admitting: Family Medicine

## 2012-05-02 MED ORDER — CARVEDILOL 12.5 MG PO TABS: 12.5000 mg | ORAL_TABLET | Freq: Two times a day (BID) | ORAL | Status: DC

## 2012-06-05 ENCOUNTER — Other Ambulatory Visit: Payer: Self-pay | Admitting: Family Medicine

## 2012-06-25 ENCOUNTER — Other Ambulatory Visit: Payer: Self-pay | Admitting: Family Medicine

## 2012-07-07 ENCOUNTER — Other Ambulatory Visit: Payer: Self-pay

## 2012-07-07 MED ORDER — VALACYCLOVIR HCL 1 G PO TABS: 2000.0000 mg | ORAL_TABLET | ORAL | Status: DC

## 2012-07-07 NOTE — Telephone Encounter (Signed)
OV 03/15/12 showing as historical med no info.  PLz advise     MW

## 2012-07-28 ENCOUNTER — Other Ambulatory Visit: Payer: Self-pay | Admitting: Family Medicine

## 2012-08-12 ENCOUNTER — Other Ambulatory Visit: Payer: Self-pay | Admitting: Family Medicine

## 2012-08-12 DIAGNOSIS — I10 Essential (primary) hypertension: Secondary | ICD-10-CM

## 2012-08-12 NOTE — Telephone Encounter (Signed)
Refill for Coreg sent to [facility] pharmacy.

## 2012-08-24 ENCOUNTER — Ambulatory Visit (INDEPENDENT_AMBULATORY_CARE_PROVIDER_SITE_OTHER): Payer: BC Managed Care – PPO | Admitting: Family Medicine

## 2012-08-24 ENCOUNTER — Encounter: Payer: Self-pay | Admitting: Family Medicine

## 2012-08-24 VITALS — BP 122/84 | HR 72 | Temp 98.4°F | Wt 254.4 lb

## 2012-08-24 DIAGNOSIS — R05 Cough: Secondary | ICD-10-CM

## 2012-08-24 DIAGNOSIS — J329 Chronic sinusitis, unspecified: Secondary | ICD-10-CM

## 2012-08-24 MED ORDER — GUAIFENESIN-CODEINE 100-10 MG/5ML PO SYRP: ORAL_SOLUTION | ORAL | Status: DC

## 2012-08-24 MED ORDER — LEVOFLOXACIN 500 MG PO TABS: 500.0000 mg | ORAL_TABLET | Freq: Every day | ORAL | Status: DC

## 2012-08-24 NOTE — Patient Instructions (Addendum)

## 2012-08-24 NOTE — Progress Notes (Signed)
  Subjective:     Kayla Banks is a 57 y.o. female who presents for evaluation of symptoms of a URI. Symptoms include congestion, cough described as productive, facial pain, nasal congestion, no  fever, purulent nasal discharge, sinus pressure and sore throat. Onset of symptoms was 7 days ago, and has been gradually worsening since that time. Treatment to date: antihistamines and cough suppressants.  The following portions of the patient's history were reviewed and updated as appropriate: allergies, current medications, past family history, past medical history, past social history, past surgical history and problem list.  Review of Systems Pertinent items are noted in HPI.   Objective:    BP 122/84  Pulse 72  Temp 98.4 F (36.9 C) (Oral)  Wt 254 lb 6.4 oz (115.395 kg)  SpO2 97% General appearance: alert, cooperative, appears stated age and no distress Ears: normal TM's and external ear canals both ears Nose: green discharge, moderate congestion, turbinates red, swollen, sinus tenderness bilateral Throat: abnormal findings: mild oropharyngeal erythema Neck: mild anterior cervical adenopathy, supple, symmetrical, trachea midline and thyroid not enlarged, symmetric, no tenderness/mass/nodules Lungs: clear to auscultation bilaterally Heart: S1, S2 normal   Assessment:    sinusitis   Plan:    Discussed the diagnosis and treatment of sinusitis. Suggested symptomatic OTC remedies. Nasal saline spray for congestion. levaquin per orders. Follow up as needed.

## 2012-09-21 ENCOUNTER — Ambulatory Visit: Payer: BC Managed Care – PPO | Admitting: Family Medicine

## 2012-09-27 ENCOUNTER — Ambulatory Visit: Payer: BC Managed Care – PPO | Admitting: Family Medicine

## 2012-10-01 ENCOUNTER — Other Ambulatory Visit: Payer: Self-pay

## 2012-10-07 ENCOUNTER — Other Ambulatory Visit: Payer: Self-pay | Admitting: Family Medicine

## 2012-10-08 ENCOUNTER — Other Ambulatory Visit: Payer: Self-pay | Admitting: Family Medicine

## 2012-12-09 ENCOUNTER — Other Ambulatory Visit: Payer: Self-pay | Admitting: Family Medicine

## 2013-01-11 ENCOUNTER — Other Ambulatory Visit: Payer: Self-pay | Admitting: Family Medicine

## 2013-03-21 ENCOUNTER — Other Ambulatory Visit: Payer: Self-pay | Admitting: Family Medicine

## 2013-05-09 ENCOUNTER — Telehealth: Payer: Self-pay

## 2013-05-09 ENCOUNTER — Ambulatory Visit (INDEPENDENT_AMBULATORY_CARE_PROVIDER_SITE_OTHER): Payer: BC Managed Care – PPO | Admitting: Family Medicine

## 2013-05-09 ENCOUNTER — Encounter: Payer: Self-pay | Admitting: Family Medicine

## 2013-05-09 VITALS — BP 135/89 | HR 70 | Temp 98.0°F | Ht 66.0 in | Wt 266.4 lb

## 2013-05-09 DIAGNOSIS — I10 Essential (primary) hypertension: Secondary | ICD-10-CM

## 2013-05-09 DIAGNOSIS — Z0000 Encounter for general adult medical examination without abnormal findings: Secondary | ICD-10-CM

## 2013-05-09 DIAGNOSIS — Z23 Encounter for immunization: Secondary | ICD-10-CM

## 2013-05-09 DIAGNOSIS — Z1231 Encounter for screening mammogram for malignant neoplasm of breast: Secondary | ICD-10-CM

## 2013-05-09 DIAGNOSIS — E669 Obesity, unspecified: Secondary | ICD-10-CM | POA: Insufficient documentation

## 2013-05-09 LAB — POCT URINALYSIS DIPSTICK
Ketones, UA: NEGATIVE
Leukocytes, UA: NEGATIVE
Nitrite, UA: NEGATIVE
Protein, UA: NEGATIVE
Urobilinogen, UA: 0.2
pH, UA: 6

## 2013-05-09 LAB — BASIC METABOLIC PANEL
CO2: 29 mEq/L (ref 19–32)
Calcium: 8.9 mg/dL (ref 8.4–10.5)
Creatinine, Ser: 0.7 mg/dL (ref 0.4–1.2)
Glucose, Bld: 88 mg/dL (ref 70–99)

## 2013-05-09 LAB — CBC WITH DIFFERENTIAL/PLATELET
Basophils Absolute: 0 10*3/uL (ref 0.0–0.1)
Eosinophils Absolute: 0.2 10*3/uL (ref 0.0–0.7)
Lymphocytes Relative: 23.2 % (ref 12.0–46.0)
MCHC: 33.5 g/dL (ref 30.0–36.0)
Monocytes Absolute: 0.4 10*3/uL (ref 0.1–1.0)
Neutro Abs: 6.3 10*3/uL (ref 1.4–7.7)
Neutrophils Relative %: 69.7 % (ref 43.0–77.0)
RDW: 15.6 % — ABNORMAL HIGH (ref 11.5–14.6)

## 2013-05-09 LAB — LIPID PANEL
HDL: 64.1 mg/dL (ref >39.00)
Total CHOL/HDL Ratio: 3
VLDL: 9.4 mg/dL (ref 0.0–40.0)

## 2013-05-09 LAB — HEPATIC FUNCTION PANEL
ALT: 15 U/L (ref 0–35)
Alkaline Phosphatase: 67 U/L (ref 39–117)
Bilirubin, Direct: 0.1 mg/dL (ref 0.0–0.3)

## 2013-05-09 MED ORDER — HYDROCHLOROTHIAZIDE 25 MG PO TABS: ORAL_TABLET | ORAL | Status: DC

## 2013-05-09 MED ORDER — CARVEDILOL 25 MG PO TABS: 25.0000 mg | ORAL_TABLET | Freq: Two times a day (BID) | ORAL | Status: DC

## 2013-05-09 MED ORDER — EPINEPHRINE 0.3 MG/0.3ML IJ SOAJ: 0.3000 mg | INTRAMUSCULAR | Status: DC | PRN

## 2013-05-09 MED ORDER — AMLODIPINE BESYLATE 5 MG PO TABS: ORAL_TABLET | ORAL | Status: DC

## 2013-05-09 MED ORDER — CARVEDILOL 12.5 MG PO TABS: ORAL_TABLET | ORAL | Status: DC

## 2013-05-09 NOTE — Telephone Encounter (Signed)
Rx was not received by the pharmacy but they did get the 25 mg.     KP

## 2013-05-09 NOTE — Progress Notes (Signed)
Subjective:     Kayla Banks is a 57 y.o. female and is here for a comprehensive physical exam. The patient reports no problems.  History   Social History  . Marital Status: Single    Spouse Name: N/A    Number of Children: N/A  . Years of Education: N/A   Occupational History  . [employer]    Social History Main Topics  . Smoking status: Never Smoker   . Smokeless tobacco: Never Used  . Alcohol Use: No  . Drug Use: No  . Sexual Activity: Yes    Partners: Male   Other Topics Concern  . Not on file   Social History Narrative   Exercise-- no   Health Maintenance  Topic Date Due  . Influenza Vaccine  03/17/2013  . Mammogram  12/28/2013  . Pap Smear  01/07/2015  . Colonoscopy  09/18/2018  . Tetanus/tdap  11/16/2019    The following portions of the patient's history were reviewed and updated as appropriate:  She  has a past medical history of Thyroid disease; Hypothyroidism; Heart murmur; and Hypertension. She  does not have any pertinent problems on file. She  has past surgical history that includes Lumbar laminectomy; Foot surgery; and Abdominal hysterectomy (02/2012). Her family history includes Breast cancer (age of onset: 57) in her maternal aunt; Cancer (age of onset: 70) in her maternal aunt; Diabetes in her mother; Heart disease (age of onset: 37) in her father; Hyperlipidemia in her mother; Hypertension in her father and mother; Hyperthyroidism in her sister; Hypothyroidism in her sister. She  reports that she has never smoked. She has never used smokeless tobacco. She reports that she does not drink alcohol or use illicit drugs. She has a current medication list which includes the following prescription(s): amlodipine, carvedilol, cephalexin, epinephrine, hydrochlorothiazide, levothyroxine, triamcinolone cream, and valacyclovir. Current Outpatient Prescriptions on File Prior to Visit  Medication Sig Dispense Refill  . amLODipine (NORVASC) 5 MG  tablet TAKE 1 TABLET BY MOUTH DAILY  90 tablet  0  . carvedilol (COREG) 12.5 MG tablet TAKE 1 TABLET BY MOUTH TWICE DAILY WITH A MEAL  60 tablet  5  . hydrochlorothiazide (HYDRODIURIL) 25 MG tablet 1 tab by mouth daily--labs are due now  100 tablet  0  . levothyroxine (SYNTHROID, LEVOTHROID) 25 MCG tablet TAKE 1 TABLET BY MOUTH EVERY DAY  30 tablet  5  . valACYclovir (VALTREX) 1000 MG tablet TAKE 2 TABLETS BY MOUTH AT FIRST SIGN OF COLD SORE AS DIRECTED  30 tablet  2   No current facility-administered medications on file prior to visit.   She is allergic to erythromycin; lisinopril; and penicillins..  Review of Systems Review of Systems  Constitutional: Negative for activity change, appetite change and fatigue.  HENT: Negative for hearing loss, congestion, tinnitus and ear discharge.  dentist q69m Eyes: Negative for visual disturbance (see optho q2y -- vision corrected to 20/20 with glasses).  Respiratory: Negative for cough, chest tightness and shortness of breath.   Cardiovascular: Negative for chest pain, palpitations and leg swelling.  Gastrointestinal: Negative for abdominal pain, diarrhea, constipation and abdominal distention.  Genitourinary: Negative for urgency, frequency, decreased urine volume and difficulty urinating.  Musculoskeletal: Negative for back pain, arthralgias and gait problem.  Skin: Negative for color change, pallor and rash.  Neurological: Negative for dizziness, light-headedness, numbness and headaches.  Hematological: Negative for adenopathy. Does not bruise/bleed easily.  Psychiatric/Behavioral: Negative for suicidal ideas, confusion, sleep disturbance, self-injury, dysphoric  mood, decreased concentration and agitation.       Objective:    BP 135/89  Pulse 70  Temp(Src) 98 F (36.7 C) (Oral)  Ht 5\' 6"  (1.676 m)  Wt 266 lb 6.4 oz (120.838 kg)  BMI 43.02 kg/m2  SpO2 98%  LMP 01/28/2012 General appearance: alert, cooperative, appears stated age and no  distress Head: Normocephalic, without obvious abnormality, atraumatic Eyes: conjunctivae/corneas clear. PERRL, EOM's intact. Fundi benign. Ears: normal TM's and external ear canals both ears Nose: Nares normal. Septum midline. Mucosa normal. No drainage or sinus tenderness. Throat: lips, mucosa, and tongue normal; teeth and gums normal Neck: no adenopathy, no carotid bruit, no JVD, supple, symmetrical, trachea midline and thyroid not enlarged, symmetric, no tenderness/mass/nodules Back: symmetric, no curvature. ROM normal. No CVA tenderness. Lungs: clear to auscultation bilaterally Breasts: normal appearance, no masses or tenderness Heart: regular rate and rhythm, S1, S2 normal, no murmur, click, rub or gallop Abdomen: soft, non-tender; bowel sounds normal; no masses,  no organomegaly Pelvic: deferred Extremities: extremities normal, atraumatic, no cyanosis or edema Pulses: 2+ and symmetric Skin: Skin color, texture, turgor normal. No rashes or lesions Lymph nodes: Cervical, supraclavicular, and axillary nodes normal. Neurologic: Alert and oriented X 3, normal strength and tone. Normal symmetric reflexes. Normal coordination and gait Psych- no depression, no anxiety      Assessment:    Healthy female exam.      Plan:    ghm utd Check labs See After Visit Summary for Counseling Recommendations

## 2013-05-09 NOTE — Telephone Encounter (Signed)
Message copied by Arnette Norris on Tue May 09, 2013 10:30 AM ------      Message from: Lelon Perla      Created: Tue May 09, 2013  9:17 AM       Please cancel the 12.5 mg coreg at [facility] ------

## 2013-05-09 NOTE — Patient Instructions (Addendum)
Preventive Care for Adults, Female A healthy lifestyle and preventive care can promote health and wellness. Preventive health guidelines for women include the following key practices.  A routine yearly physical is a good way to check with your caregiver about your health and preventive screening. It is a chance to share any concerns and updates on your health, and to receive a thorough exam.  Visit your dentist for a routine exam and preventive care every 6 months. Brush your teeth twice a day and floss once a day. Good oral hygiene prevents tooth decay and gum disease.  The frequency of eye exams is based on your age, health, family medical history, use of contact lenses, and other factors. Follow your caregiver's recommendations for frequency of eye exams.  Eat a healthy diet. Foods like vegetables, fruits, whole grains, low-fat dairy products, and lean protein foods contain the nutrients you need without too many calories. Decrease your intake of foods high in solid fats, added sugars, and salt. Eat the right amount of calories for you.Get information about a proper diet from your caregiver, if necessary.  Regular physical exercise is one of the most important things you can do for your health. Most adults should get at least 150 minutes of moderate-intensity exercise (any activity that increases your heart rate and causes you to sweat) each week. In addition, most adults need muscle-strengthening exercises on 2 or more days a week.  Maintain a healthy weight. The body mass index (BMI) is a screening tool to identify possible weight problems. It provides an estimate of body fat based on height and weight. Your caregiver can help determine your BMI, and can help you achieve or maintain a healthy weight.For adults 20 years and older:  A BMI below 18.5 is considered underweight.  A BMI of 18.5 to 24.9 is normal.  A BMI of 25 to 29.9 is considered overweight.  A BMI of 30 and above is  considered obese.  Maintain normal blood lipids and cholesterol levels by exercising and minimizing your intake of saturated fat. Eat a balanced diet with plenty of fruit and vegetables. Blood tests for lipids and cholesterol should begin at age 20 and be repeated every 5 years. If your lipid or cholesterol levels are high, you are over 50, or you are at high risk for heart disease, you may need your cholesterol levels checked more frequently.Ongoing high lipid and cholesterol levels should be treated with medicines if diet and exercise are not effective.  If you smoke, find out from your caregiver how to quit. If you do not use tobacco, do not start.  If you are pregnant, do not drink alcohol. If you are breastfeeding, be very cautious about drinking alcohol. If you are not pregnant and choose to drink alcohol, do not exceed 1 drink per day. One drink is considered to be 12 ounces (355 mL) of beer, 5 ounces (148 mL) of wine, or 1.5 ounces (44 mL) of liquor.  Avoid use of street drugs. Do not share needles with anyone. Ask for help if you need support or instructions about stopping the use of drugs.  High blood pressure causes heart disease and increases the risk of stroke. Your blood pressure should be checked at least every 1 to 2 years. Ongoing high blood pressure should be treated with medicines if weight loss and exercise are not effective.  If you are 55 to 55 years old, ask your caregiver if you should take aspirin to prevent strokes.  Diabetes   screening involves taking a blood sample to check your fasting blood sugar level. This should be done once every 3 years, after age 45, if you are within normal weight and without risk factors for diabetes. Testing should be considered at a younger age or be carried out more frequently if you are overweight and have at least 1 risk factor for diabetes.  Breast cancer screening is essential preventive care for women. You should practice "breast  self-awareness." This means understanding the normal appearance and feel of your breasts and may include breast self-examination. Any changes detected, no matter how small, should be reported to a caregiver. Women in their 20s and 30s should have a clinical breast exam (CBE) by a caregiver as part of a regular health exam every 1 to 3 years. After age 40, women should have a CBE every year. Starting at age 40, women should consider having a mammography (breast X-ray test) every year. Women who have a family history of breast cancer should talk to their caregiver about genetic screening. Women at a high risk of breast cancer should talk to their caregivers about having magnetic resonance imaging (MRI) and a mammography every year.  The Pap test is a screening test for cervical cancer. A Pap test can show cell changes on the cervix that might become cervical cancer if left untreated. A Pap test is a procedure in which cells are obtained and examined from the lower end of the uterus (cervix).  Women should have a Pap test starting at age 21.  Between ages 21 and 29, Pap tests should be repeated every 2 years.  Beginning at age 30, you should have a Pap test every 3 years as long as the past 3 Pap tests have been normal.  Some women have medical problems that increase the chance of getting cervical cancer. Talk to your caregiver about these problems. It is especially important to talk to your caregiver if a new problem develops soon after your last Pap test. In these cases, your caregiver may recommend more frequent screening and Pap tests.  The above recommendations are the same for women who have or have not gotten the vaccine for human papillomavirus (HPV).  If you had a hysterectomy for a problem that was not cancer or a condition that could lead to cancer, then you no longer need Pap tests. Even if you no longer need a Pap test, a regular exam is a good idea to make sure no other problems are  starting.  If you are between ages 65 and 70, and you have had normal Pap tests going back 10 years, you no longer need Pap tests. Even if you no longer need a Pap test, a regular exam is a good idea to make sure no other problems are starting.  If you have had past treatment for cervical cancer or a condition that could lead to cancer, you need Pap tests and screening for cancer for at least 20 years after your treatment.  If Pap tests have been discontinued, risk factors (such as a new sexual partner) need to be reassessed to determine if screening should be resumed.  The HPV test is an additional test that may be used for cervical cancer screening. The HPV test looks for the virus that can cause the cell changes on the cervix. The cells collected during the Pap test can be tested for HPV. The HPV test could be used to screen women aged 30 years and older, and should   be used in women of any age who have unclear Pap test results. After the age of 30, women should have HPV testing at the same frequency as a Pap test.  Colorectal cancer can be detected and often prevented. Most routine colorectal cancer screening begins at the age of 50 and continues through age 75. However, your caregiver may recommend screening at an earlier age if you have risk factors for colon cancer. On a yearly basis, your caregiver may provide home test kits to check for hidden blood in the stool. Use of a small camera at the end of a tube, to directly examine the colon (sigmoidoscopy or colonoscopy), can detect the earliest forms of colorectal cancer. Talk to your caregiver about this at age 50, when routine screening begins. Direct examination of the colon should be repeated every 5 to 10 years through age 75, unless early forms of pre-cancerous polyps or small growths are found.  Hepatitis C blood testing is recommended for all people born from 1945 through 1965 and any individual with known risks for hepatitis C.  Practice  safe sex. Use condoms and avoid high-risk sexual practices to reduce the spread of sexually transmitted infections (STIs). STIs include gonorrhea, chlamydia, syphilis, trichomonas, herpes, HPV, and human immunodeficiency virus (HIV). Herpes, HIV, and HPV are viral illnesses that have no cure. They can result in disability, cancer, and death. Sexually active women aged 25 and younger should be checked for chlamydia. Older women with new or multiple partners should also be tested for chlamydia. Testing for other STIs is recommended if you are sexually active and at increased risk.  Osteoporosis is a disease in which the bones lose minerals and strength with aging. This can result in serious bone fractures. The risk of osteoporosis can be identified using a bone density scan. Women ages 65 and over and women at risk for fractures or osteoporosis should discuss screening with their caregivers. Ask your caregiver whether you should take a calcium supplement or vitamin D to reduce the rate of osteoporosis.  Menopause can be associated with physical symptoms and risks. Hormone replacement therapy is available to decrease symptoms and risks. You should talk to your caregiver about whether hormone replacement therapy is right for you.  Use sunscreen with sun protection factor (SPF) of 30 or more. Apply sunscreen liberally and repeatedly throughout the day. You should seek shade when your shadow is shorter than you. Protect yourself by wearing long sleeves, pants, a wide-brimmed hat, and sunglasses year round, whenever you are outdoors.  Once a month, do a whole body skin exam, using a mirror to look at the skin on your back. Notify your caregiver of new moles, moles that have irregular borders, moles that are larger than a pencil eraser, or moles that have changed in shape or color.  Stay current with required immunizations.  Influenza. You need a dose every fall (or winter). The composition of the flu vaccine  changes each year, so being vaccinated once is not enough.  Pneumococcal polysaccharide. You need 1 to 2 doses if you smoke cigarettes or if you have certain chronic medical conditions. You need 1 dose at age 65 (or older) if you have never been vaccinated.  Tetanus, diphtheria, pertussis (Tdap, Td). Get 1 dose of Tdap vaccine if you are younger than age 65, are over 65 and have contact with an infant, are a healthcare worker, are pregnant, or simply want to be protected from whooping cough. After that, you need a Td   booster dose every 10 years. Consult your caregiver if you have not had at least 3 tetanus and diphtheria-containing shots sometime in your life or have a deep or dirty wound.  HPV. You need this vaccine if you are a woman age 26 or younger. The vaccine is given in 3 doses over 6 months.  Measles, mumps, rubella (MMR). You need at least 1 dose of MMR if you were born in 1957 or later. You may also need a second dose.  Meningococcal. If you are age 19 to 21 and a first-year college student living in a residence hall, or have one of several medical conditions, you need to get vaccinated against meningococcal disease. You may also need additional booster doses.  Zoster (shingles). If you are age 60 or older, you should get this vaccine.  Varicella (chickenpox). If you have never had chickenpox or you were vaccinated but received only 1 dose, talk to your caregiver to find out if you need this vaccine.  Hepatitis A. You need this vaccine if you have a specific risk factor for hepatitis A virus infection or you simply wish to be protected from this disease. The vaccine is usually given as 2 doses, 6 to 18 months apart.  Hepatitis B. You need this vaccine if you have a specific risk factor for hepatitis B virus infection or you simply wish to be protected from this disease. The vaccine is given in 3 doses, usually over 6 months. Preventive Services / Frequency Ages 19 to 39  Blood  pressure check.** / Every 1 to 2 years.  Lipid and cholesterol check.** / Every 5 years beginning at age 20.  Clinical breast exam.** / Every 3 years for women in their 20s and 30s.  Pap test.** / Every 2 years from ages 21 through 29. Every 3 years starting at age 30 through age 65 or 70 with a history of 3 consecutive normal Pap tests.  HPV screening.** / Every 3 years from ages 30 through ages 65 to 70 with a history of 3 consecutive normal Pap tests.  Hepatitis C blood test.** / For any individual with known risks for hepatitis C.  Skin self-exam. / Monthly.  Influenza immunization.** / Every year.  Pneumococcal polysaccharide immunization.** / 1 to 2 doses if you smoke cigarettes or if you have certain chronic medical conditions.  Tetanus, diphtheria, pertussis (Tdap, Td) immunization. / A one-time dose of Tdap vaccine. After that, you need a Td booster dose every 10 years.  HPV immunization. / 3 doses over 6 months, if you are 26 and younger.  Measles, mumps, rubella (MMR) immunization. / You need at least 1 dose of MMR if you were born in 1957 or later. You may also need a second dose.  Meningococcal immunization. / 1 dose if you are age 19 to 21 and a first-year college student living in a residence hall, or have one of several medical conditions, you need to get vaccinated against meningococcal disease. You may also need additional booster doses.  Varicella immunization.** / Consult your caregiver.  Hepatitis A immunization.** / Consult your caregiver. 2 doses, 6 to 18 months apart.  Hepatitis B immunization.** / Consult your caregiver. 3 doses usually over 6 months. Ages 40 to 64  Blood pressure check.** / Every 1 to 2 years.  Lipid and cholesterol check.** / Every 5 years beginning at age 20.  Clinical breast exam.** / Every year after age 40.  Mammogram.** / Every year beginning at age 40   and continuing for as long as you are in good health. Consult with your  caregiver.  Pap test.** / Every 3 years starting at age 30 through age 65 or 70 with a history of 3 consecutive normal Pap tests.  HPV screening.** / Every 3 years from ages 30 through ages 65 to 70 with a history of 3 consecutive normal Pap tests.  Fecal occult blood test (FOBT) of stool. / Every year beginning at age 50 and continuing until age 75. You may not need to do this test if you get a colonoscopy every 10 years.  Flexible sigmoidoscopy or colonoscopy.** / Every 5 years for a flexible sigmoidoscopy or every 10 years for a colonoscopy beginning at age 50 and continuing until age 75.  Hepatitis C blood test.** / For all people born from 1945 through 1965 and any individual with known risks for hepatitis C.  Skin self-exam. / Monthly.  Influenza immunization.** / Every year.  Pneumococcal polysaccharide immunization.** / 1 to 2 doses if you smoke cigarettes or if you have certain chronic medical conditions.  Tetanus, diphtheria, pertussis (Tdap, Td) immunization.** / A one-time dose of Tdap vaccine. After that, you need a Td booster dose every 10 years.  Measles, mumps, rubella (MMR) immunization. / You need at least 1 dose of MMR if you were born in 1957 or later. You may also need a second dose.  Varicella immunization.** / Consult your caregiver.  Meningococcal immunization.** / Consult your caregiver.  Hepatitis A immunization.** / Consult your caregiver. 2 doses, 6 to 18 months apart.  Hepatitis B immunization.** / Consult your caregiver. 3 doses, usually over 6 months. Ages 65 and over  Blood pressure check.** / Every 1 to 2 years.  Lipid and cholesterol check.** / Every 5 years beginning at age 20.  Clinical breast exam.** / Every year after age 40.  Mammogram.** / Every year beginning at age 40 and continuing for as long as you are in good health. Consult with your caregiver.  Pap test.** / Every 3 years starting at age 30 through age 65 or 70 with a 3  consecutive normal Pap tests. Testing can be stopped between 65 and 70 with 3 consecutive normal Pap tests and no abnormal Pap or HPV tests in the past 10 years.  HPV screening.** / Every 3 years from ages 30 through ages 65 or 70 with a history of 3 consecutive normal Pap tests. Testing can be stopped between 65 and 70 with 3 consecutive normal Pap tests and no abnormal Pap or HPV tests in the past 10 years.  Fecal occult blood test (FOBT) of stool. / Every year beginning at age 50 and continuing until age 75. You may not need to do this test if you get a colonoscopy every 10 years.  Flexible sigmoidoscopy or colonoscopy.** / Every 5 years for a flexible sigmoidoscopy or every 10 years for a colonoscopy beginning at age 50 and continuing until age 75.  Hepatitis C blood test.** / For all people born from 1945 through 1965 and any individual with known risks for hepatitis C.  Osteoporosis screening.** / A one-time screening for women ages 65 and over and women at risk for fractures or osteoporosis.  Skin self-exam. / Monthly.  Influenza immunization.** / Every year.  Pneumococcal polysaccharide immunization.** / 1 dose at age 65 (or older) if you have never been vaccinated.  Tetanus, diphtheria, pertussis (Tdap, Td) immunization. / A one-time dose of Tdap vaccine if you are over   65 and have contact with an infant, are a healthcare worker, or simply want to be protected from whooping cough. After that, you need a Td booster dose every 10 years.  Varicella immunization.** / Consult your caregiver.  Meningococcal immunization.** / Consult your caregiver.  Hepatitis A immunization.** / Consult your caregiver. 2 doses, 6 to 18 months apart.  Hepatitis B immunization.** / Check with your caregiver. 3 doses, usually over 6 months. ** Family history and personal history of risk and conditions may change your caregiver's recommendations. Document Released: 09/29/2001 Document Revised: 10/26/2011  Document Reviewed: 12/29/2010 [employer] Patient Information 2014 [employer].  

## 2013-05-09 NOTE — Addendum Note (Signed)
Addended by: Arnette Norris on: 05/09/2013 09:59 AM   Modules accepted: Orders

## 2013-05-11 ENCOUNTER — Other Ambulatory Visit: Payer: Self-pay | Admitting: Family Medicine

## 2013-05-11 DIAGNOSIS — I10 Essential (primary) hypertension: Secondary | ICD-10-CM

## 2013-06-05 ENCOUNTER — Other Ambulatory Visit: Payer: Self-pay | Admitting: Family Medicine

## 2013-06-12 ENCOUNTER — Ambulatory Visit
Admission: RE | Admit: 2013-06-12 | Discharge: 2013-06-12 | Disposition: A | Discharge status: Home / Self Care | Payer: BC Managed Care – PPO | Source: Ambulatory Visit | Attending: Family Medicine | Admitting: Family Medicine

## 2013-06-12 DIAGNOSIS — Z1231 Encounter for screening mammogram for malignant neoplasm of breast: Secondary | ICD-10-CM

## 2013-06-25 ENCOUNTER — Other Ambulatory Visit: Payer: Self-pay | Admitting: Family Medicine

## 2013-08-08 ENCOUNTER — Other Ambulatory Visit: Payer: Self-pay | Admitting: *Deleted

## 2013-08-08 ENCOUNTER — Encounter: Payer: Self-pay | Admitting: Family Medicine

## 2013-08-08 MED ORDER — MELOXICAM 15 MG PO TABS: 15.0000 mg | ORAL_TABLET | Freq: Every day | ORAL | Status: DC | PRN

## 2013-08-28 ENCOUNTER — Other Ambulatory Visit: Payer: BC Managed Care – PPO

## 2014-01-10 ENCOUNTER — Encounter: Payer: Self-pay | Admitting: Family Medicine

## 2014-01-10 ENCOUNTER — Ambulatory Visit (INDEPENDENT_AMBULATORY_CARE_PROVIDER_SITE_OTHER): Payer: BC Managed Care – PPO | Admitting: Family Medicine

## 2014-01-10 VITALS — BP 160/92 | HR 91 | Temp 98.2°F | Wt 251.0 lb

## 2014-01-10 DIAGNOSIS — R059 Cough, unspecified: Secondary | ICD-10-CM

## 2014-01-10 DIAGNOSIS — B001 Herpesviral vesicular dermatitis: Secondary | ICD-10-CM

## 2014-01-10 DIAGNOSIS — R05 Cough: Secondary | ICD-10-CM

## 2014-01-10 DIAGNOSIS — B009 Herpesviral infection, unspecified: Secondary | ICD-10-CM

## 2014-01-10 DIAGNOSIS — J329 Chronic sinusitis, unspecified: Secondary | ICD-10-CM

## 2014-01-10 MED ORDER — VALACYCLOVIR HCL 1 G PO TABS: 1000.0000 mg | ORAL_TABLET | Freq: Three times a day (TID) | ORAL | Status: DC

## 2014-01-10 MED ORDER — BENZONATATE 200 MG PO CAPS: 200.0000 mg | ORAL_CAPSULE | Freq: Three times a day (TID) | ORAL | Status: DC | PRN

## 2014-01-10 MED ORDER — SULFAMETHOXAZOLE-TMP DS 800-160 MG PO TABS: 1.0000 | ORAL_TABLET | Freq: Two times a day (BID) | ORAL | Status: DC

## 2014-01-10 NOTE — Progress Notes (Signed)
Pre visit review using our clinic review tool, if applicable. No additional management support is needed unless otherwise documented below in the visit note. 

## 2014-01-10 NOTE — Patient Instructions (Signed)

## 2014-01-10 NOTE — Progress Notes (Signed)
  Subjective:     Kayla Banks is a 58 y.o. female who presents for evaluation of symptoms of a URI. Symptoms include congestion, post nasal drip, productive cough with  unknown colored sputum, sore throat and chills and sweats. Onset of symptoms was 4 days ago, and has been gradually worsening since that time. Treatment to date: antihistamines and cough suppressants.  The following portions of the patient's history were reviewed and updated as appropriate: allergies, current medications, past family history, past medical history, past social history, past surgical history and problem list.  Review of Systems Pertinent items are noted in HPI.   Objective:    BP 160/92  Pulse 91  Temp(Src) 98.2 F (36.8 C) (Oral)  Wt 251 lb (113.853 kg)  SpO2 98%  LMP 01/28/2012 General appearance: alert, cooperative, appears stated age and no distress Head: Normocephalic, without obvious abnormality, atraumatic Ears: normal TM's and external ear canals both ears Nose: yellow discharge, moderate congestion, turbinates pink, swollen, no sinus tenderness Throat: abnormal findings: mild oropharyngeal erythema and pnd with cold sores around mouth Neck: moderate anterior cervical adenopathy, supple, symmetrical, trachea midline and thyroid not enlarged, symmetric, no tenderness/mass/nodules Lungs: clear to auscultation bilaterally Heart: S1, S2 normal Extremities: extremities normal, atraumatic, no cyanosis or edema   Assessment:    sinusitis   Plan:    Discussed the diagnosis and treatment of sinusitis. Suggested symptomatic OTC remedies. Nasal saline spray for congestion. TMP-SMX DS per orders. Nasal steroids per orders. Follow up as needed. ---  otc antihistamine and nasal steroid

## 2014-01-30 ENCOUNTER — Encounter: Payer: Self-pay | Admitting: Family Medicine

## 2014-01-30 ENCOUNTER — Ambulatory Visit (INDEPENDENT_AMBULATORY_CARE_PROVIDER_SITE_OTHER): Payer: BC Managed Care – PPO | Admitting: Family Medicine

## 2014-01-30 VITALS — BP 130/70 | HR 63 | Temp 97.6°F | Wt 253.4 lb

## 2014-01-30 DIAGNOSIS — R5383 Other fatigue: Secondary | ICD-10-CM

## 2014-01-30 DIAGNOSIS — E039 Hypothyroidism, unspecified: Secondary | ICD-10-CM

## 2014-01-30 DIAGNOSIS — I10 Essential (primary) hypertension: Secondary | ICD-10-CM

## 2014-01-30 DIAGNOSIS — R5381 Other malaise: Secondary | ICD-10-CM

## 2014-01-30 LAB — BASIC METABOLIC PANEL
BUN: 14 mg/dL (ref 6–23)
CO2: 29 mEq/L (ref 19–32)
Calcium: 9.1 mg/dL (ref 8.4–10.5)
Chloride: 102 mEq/L (ref 96–112)
Creatinine, Ser: 0.7 mg/dL (ref 0.4–1.2)
GFR: 121.26 mL/min (ref >60.00)
Glucose, Bld: 88 mg/dL (ref 70–99)
POTASSIUM: 3.6 meq/L (ref 3.5–5.1)
Sodium: 138 mEq/L (ref 135–145)

## 2014-01-30 LAB — LIPID PANEL
CHOL/HDL RATIO: 3
Cholesterol: 186 mg/dL (ref 0–200)
HDL: 67.1 mg/dL (ref >39.00)
LDL Cholesterol: 110 mg/dL — ABNORMAL HIGH (ref 0–99)
NonHDL: 118.9
Triglycerides: 44 mg/dL (ref 0.0–149.0)
VLDL: 8.8 mg/dL (ref 0.0–40.0)

## 2014-01-30 LAB — HEPATIC FUNCTION PANEL
ALT: 12 U/L (ref 0–35)
AST: 15 U/L (ref 0–37)
Albumin: 3.7 g/dL (ref 3.5–5.2)
Alkaline Phosphatase: 69 U/L (ref 39–117)
BILIRUBIN DIRECT: 0 mg/dL (ref 0.0–0.3)
TOTAL PROTEIN: 7.3 g/dL (ref 6.0–8.3)
Total Bilirubin: 0.5 mg/dL (ref 0.2–1.2)

## 2014-01-30 LAB — CBC WITH DIFFERENTIAL/PLATELET
BASOS ABS: 0 10*3/uL (ref 0.0–0.1)
BASOS PCT: 0.3 % (ref 0.0–3.0)
EOS ABS: 0.1 10*3/uL (ref 0.0–0.7)
Eosinophils Relative: 1.5 % (ref 0.0–5.0)
HCT: 37.9 % (ref 36.0–46.0)
Hemoglobin: 12.3 g/dL (ref 12.0–15.0)
Lymphocytes Relative: 22.4 % (ref 12.0–46.0)
Lymphs Abs: 2.1 10*3/uL (ref 0.7–4.0)
MCHC: 32.4 g/dL (ref 30.0–36.0)
MCV: 85.5 fl (ref 78.0–100.0)
MONO ABS: 0.5 10*3/uL (ref 0.1–1.0)
Monocytes Relative: 5.5 % (ref 3.0–12.0)
NEUTROS PCT: 70.3 % (ref 43.0–77.0)
Neutro Abs: 6.7 10*3/uL (ref 1.4–7.7)
PLATELETS: 267 10*3/uL (ref 150.0–400.0)
RBC: 4.43 Mil/uL (ref 3.87–5.11)
RDW: 15.9 % — ABNORMAL HIGH (ref 11.5–15.5)
WBC: 9.5 10*3/uL (ref 4.0–10.5)

## 2014-01-30 LAB — TSH: TSH: 1.94 u[IU]/mL (ref 0.35–4.50)

## 2014-01-30 LAB — T4, FREE: FREE T4: 0.83 ng/dL (ref 0.60–1.60)

## 2014-01-30 LAB — T3, FREE: T3 FREE: 3.2 pg/mL (ref 2.3–4.2)

## 2014-01-30 NOTE — Patient Instructions (Signed)

## 2014-01-30 NOTE — Progress Notes (Signed)
Pre visit review using our clinic review tool, if applicable. No additional management support is needed unless otherwise documented below in the visit note. 

## 2014-01-30 NOTE — Progress Notes (Signed)
Subjective:    Patient here for follow-up of elevated blood pressure.  She is not exercising and is adherent to a low-salt diet.  Blood pressure is well controlled at home. Cardiac symptoms: fatigue. Patient denies: chest pain, chest pressure/discomfort, claudication, dyspnea, exertional chest pressure/discomfort, irregular heart beat, near-syncope, orthopnea, palpitations, paroxysmal nocturnal dyspnea, syncope and tachypnea. Cardiovascular risk factors: hypertension, obesity (BMI >= 30 kg/m2) and sedentary lifestyle. Use of agents associated with hypertension: none. History of target organ damage: none.  The following portions of the patient's history were reviewed and updated as appropriate:  She  has a past medical history of Thyroid disease; Hypothyroidism; Heart murmur; and Hypertension. She  does not have any pertinent problems on file. She  has past surgical history that includes Lumbar laminectomy; Foot surgery; and Abdominal hysterectomy (02/2012). Her family history includes Breast cancer (age of onset: 24) in her maternal aunt; Cancer (age of onset: 25) in her maternal aunt; Diabetes in her mother; Heart disease (age of onset: 79) in her father; Hyperlipidemia in her mother; Hypertension in her father and mother; Hyperthyroidism in her sister; Hypothyroidism in her sister. She  reports that she has never smoked. She has never used smokeless tobacco. She reports that she does not drink alcohol or use illicit drugs. She has a current medication list which includes the following prescription(s): carvedilol, epinephrine, hydrochlorothiazide, levothyroxine, valacyclovir, amlodipine, and meloxicam. Current Outpatient Prescriptions on File Prior to Visit  Medication Sig Dispense Refill  . carvedilol (COREG) 25 MG tablet Take 1 tablet (25 mg total) by mouth 2 (two) times daily with a meal.  180 tablet  3  . EPINEPHrine (EPIPEN 2-PAK) 0.3 mg/0.3 mL SOAJ injection Inject 0.3 mLs (0.3 mg total) into  the muscle as needed. For allergic reaction  1 Device  1  . hydrochlorothiazide (HYDRODIURIL) 25 MG tablet 1 tab by mouth daily--labs are due now  100 tablet  2  . levothyroxine (SYNTHROID, LEVOTHROID) 25 MCG tablet TAKE 1 TABLET BY MOUTH DAILY  30 tablet  11  . valACYclovir (VALTREX) 1000 MG tablet Take 1 tablet (1,000 mg total) by mouth 3 (three) times daily.  30 tablet  0  . amLODipine (NORVASC) 5 MG tablet TAKE 1 TABLET BY MOUTH EVERY DAY  90 tablet  1  . meloxicam (MOBIC) 15 MG tablet Take 1 tablet (15 mg total) by mouth daily as needed. For pain  30 tablet  0   No current facility-administered medications on file prior to visit.   She is allergic to erythromycin; lisinopril; and penicillins..  Review of Systems Pertinent items are noted in HPI.     Objective:    BP 130/70  Pulse 63  Temp(Src) 97.6 F (36.4 C) (Oral)  Wt 253 lb 6.4 oz (114.941 kg)  SpO2 97%  LMP 01/28/2012 General appearance: alert, cooperative, appears stated age and no distress Neck: no adenopathy, no carotid bruit, no JVD, supple, symmetrical, trachea midline and thyroid not enlarged, symmetric, no tenderness/mass/nodules Lungs: clear to auscultation bilaterally Heart: S1, S2 normal Extremities: edema tr pitting edema    Assessment:    Hypertension, normal blood pressure . Evidence of target organ damage: none.    Plan:    Medication: no change. Dietary sodium restriction. Regular aerobic exercise. Check blood pressures 2-3 times weekly and record. Follow up: 6 months and as needed.   1. Unspecified hypothyroidism Check labs - Basic metabolic panel - CBC with Differential - Hepatic function panel - Lipid panel - POCT urinalysis dipstick - TSH -  T3, free - T4, free  2. Other malaise and fatigue Check labs--- may be weather related--pt agrees - Basic metabolic panel - CBC with Differential - Hepatic function panel - Lipid panel - POCT urinalysis dipstick - TSH  3. HTN  (hypertension) stable - Basic metabolic panel - CBC with Differential - Hepatic function panel - Lipid panel - POCT urinalysis dipstick - TSH

## 2014-01-31 ENCOUNTER — Telehealth: Payer: Self-pay | Admitting: Family Medicine

## 2014-01-31 LAB — POCT URINALYSIS DIPSTICK
BILIRUBIN UA: NEGATIVE
Blood, UA: NEGATIVE
Glucose, UA: NEGATIVE
Ketones, UA: NEGATIVE
Leukocytes, UA: NEGATIVE
Nitrite, UA: NEGATIVE
Protein, UA: NEGATIVE
SPEC GRAV UA: 1.01
UROBILINOGEN UA: 0.2
pH, UA: 7

## 2014-01-31 NOTE — Telephone Encounter (Signed)
Relevant patient education assigned to patient using Emmi. ° °

## 2014-03-19 ENCOUNTER — Other Ambulatory Visit: Payer: Self-pay | Admitting: Family Medicine

## 2014-05-17 ENCOUNTER — Other Ambulatory Visit: Payer: Self-pay | Admitting: Family Medicine

## 2014-06-27 ENCOUNTER — Other Ambulatory Visit: Payer: Self-pay | Admitting: Family Medicine

## 2014-09-27 ENCOUNTER — Other Ambulatory Visit: Payer: Self-pay

## 2014-09-27 MED ORDER — CARVEDILOL 25 MG PO TABS: 25.0000 mg | ORAL_TABLET | Freq: Two times a day (BID) | ORAL | Status: DC

## 2014-11-28 ENCOUNTER — Telehealth: Payer: Self-pay | Admitting: Family Medicine

## 2014-11-28 NOTE — Telephone Encounter (Signed)
Caller name:Kayla Banks, Kayla Banks Relation to JH:ERDE Call back number:[phone] Pharmacy:[facility]  Reason for call: pt has scheduled an appt for a cpe on 01-24-15, however states she will be out of bp meds before her appt, wants to know if dr. Etter Sjogren can provide her rx to last until her appt   carvedilol (COREG) 25 MG tablet

## 2014-11-29 MED ORDER — CARVEDILOL 25 MG PO TABS: 25.0000 mg | ORAL_TABLET | Freq: Two times a day (BID) | ORAL | Status: DC

## 2014-11-29 NOTE — Telephone Encounter (Signed)
Rx faxed.    KP 

## 2015-01-02 ENCOUNTER — Telehealth: Payer: Self-pay | Admitting: Family Medicine

## 2015-01-02 NOTE — Telephone Encounter (Signed)
Pre Visit letter sent  °

## 2015-01-08 ENCOUNTER — Ambulatory Visit: Payer: BC Managed Care – PPO | Admitting: Internal Medicine

## 2015-01-23 ENCOUNTER — Telehealth: Payer: Self-pay | Admitting: Behavioral Health

## 2015-01-23 NOTE — Telephone Encounter (Signed)
Unable to reach patient at time of Pre-Visit Call.  Left message for patient to return call when available.    

## 2015-01-24 ENCOUNTER — Encounter: Payer: BC Managed Care – PPO | Admitting: Family Medicine

## 2015-01-24 NOTE — Telephone Encounter (Signed)
Open in error

## 2015-01-24 NOTE — Telephone Encounter (Signed)
Tried to contact patient for pre-visit call; left message on voicemail to give [place] a call back.

## 2015-01-28 ENCOUNTER — Encounter: Payer: BC Managed Care – PPO | Admitting: Family Medicine

## 2015-02-05 ENCOUNTER — Other Ambulatory Visit: Payer: Self-pay | Admitting: Family Medicine

## 2015-02-07 ENCOUNTER — Telehealth: Payer: Self-pay | Admitting: Behavioral Health

## 2015-02-07 NOTE — Telephone Encounter (Signed)
Unable to reach patient at time of Pre-Visit Call.  Left message for patient to return call when available.    

## 2015-02-08 ENCOUNTER — Ambulatory Visit (INDEPENDENT_AMBULATORY_CARE_PROVIDER_SITE_OTHER): Payer: BC Managed Care – PPO | Admitting: Family Medicine

## 2015-02-08 ENCOUNTER — Other Ambulatory Visit (HOSPITAL_COMMUNITY)
Admission: RE | Admit: 2015-02-08 | Discharge: 2015-02-08 | Disposition: A | Discharge status: Home / Self Care | Payer: BC Managed Care – PPO | Source: Ambulatory Visit | Attending: Family Medicine | Admitting: Family Medicine

## 2015-02-08 ENCOUNTER — Encounter: Payer: Self-pay | Admitting: Family Medicine

## 2015-02-08 VITALS — BP 144/82 | HR 67 | Temp 98.1°F | Resp 18 | Ht 65.0 in | Wt 268.4 lb

## 2015-02-08 DIAGNOSIS — I10 Essential (primary) hypertension: Secondary | ICD-10-CM | POA: Diagnosis not present

## 2015-02-08 DIAGNOSIS — E039 Hypothyroidism, unspecified: Secondary | ICD-10-CM

## 2015-02-08 DIAGNOSIS — M150 Primary generalized (osteo)arthritis: Principal | ICD-10-CM

## 2015-02-08 DIAGNOSIS — R609 Edema, unspecified: Secondary | ICD-10-CM

## 2015-02-08 DIAGNOSIS — Z8619 Personal history of other infectious and parasitic diseases: Secondary | ICD-10-CM

## 2015-02-08 DIAGNOSIS — Z124 Encounter for screening for malignant neoplasm of cervix: Secondary | ICD-10-CM | POA: Diagnosis not present

## 2015-02-08 DIAGNOSIS — Z1239 Encounter for other screening for malignant neoplasm of breast: Secondary | ICD-10-CM

## 2015-02-08 DIAGNOSIS — M159 Polyosteoarthritis, unspecified: Secondary | ICD-10-CM

## 2015-02-08 DIAGNOSIS — Z0000 Encounter for general adult medical examination without abnormal findings: Secondary | ICD-10-CM | POA: Diagnosis not present

## 2015-02-08 DIAGNOSIS — D229 Melanocytic nevi, unspecified: Secondary | ICD-10-CM

## 2015-02-08 DIAGNOSIS — Z01419 Encounter for gynecological examination (general) (routine) without abnormal findings: Secondary | ICD-10-CM | POA: Diagnosis not present

## 2015-02-08 LAB — CBC WITH DIFFERENTIAL/PLATELET
Basophils Absolute: 0 10*3/uL (ref 0.0–0.1)
Basophils Relative: 0 % (ref 0–1)
Eosinophils Absolute: 0.2 10*3/uL (ref 0.0–0.7)
Eosinophils Relative: 2 % (ref 0–5)
HEMATOCRIT: 37.9 % (ref 36.0–46.0)
HEMOGLOBIN: 12.5 g/dL (ref 12.0–15.0)
LYMPHS ABS: 2.2 10*3/uL (ref 0.7–4.0)
Lymphocytes Relative: 23 % (ref 12–46)
MCH: 27.5 pg (ref 26.0–34.0)
MCHC: 33 g/dL (ref 30.0–36.0)
MCV: 83.5 fL (ref 78.0–100.0)
MPV: 10.2 fL (ref 8.6–12.4)
Monocytes Absolute: 0.7 10*3/uL (ref 0.1–1.0)
Monocytes Relative: 7 % (ref 3–12)
NEUTROS PCT: 68 % (ref 43–77)
Neutro Abs: 6.5 10*3/uL (ref 1.7–7.7)
Platelets: 306 10*3/uL (ref 150–400)
RBC: 4.54 MIL/uL (ref 3.87–5.11)
RDW: 15.9 % — AB (ref 11.5–15.5)
WBC: 9.6 10*3/uL (ref 4.0–10.5)

## 2015-02-08 LAB — POCT URINALYSIS DIPSTICK
Bilirubin, UA: NEGATIVE
Blood, UA: NEGATIVE
GLUCOSE UA: NEGATIVE
Ketones, UA: NEGATIVE
LEUKOCYTES UA: NEGATIVE
NITRITE UA: NEGATIVE
PH UA: 6
PROTEIN UA: NEGATIVE
Spec Grav, UA: 1.02
UROBILINOGEN UA: 4

## 2015-02-08 MED ORDER — LEVOTHYROXINE SODIUM 25 MCG PO TABS: 25.0000 ug | ORAL_TABLET | Freq: Every day | ORAL | Status: DC

## 2015-02-08 MED ORDER — VALACYCLOVIR HCL 1 G PO TABS: 1000.0000 mg | ORAL_TABLET | Freq: Three times a day (TID) | ORAL | Status: DC

## 2015-02-08 MED ORDER — MELOXICAM 15 MG PO TABS: 15.0000 mg | ORAL_TABLET | Freq: Every day | ORAL | Status: DC | PRN

## 2015-02-08 MED ORDER — FUROSEMIDE 20 MG PO TABS: 20.0000 mg | ORAL_TABLET | Freq: Every day | ORAL | Status: DC

## 2015-02-08 MED ORDER — EPINEPHRINE 0.3 MG/0.3ML IJ SOAJ: 0.3000 mg | INTRAMUSCULAR | Status: DC | PRN

## 2015-02-08 NOTE — Progress Notes (Signed)
Subjective:     Kayla Banks is a 59 y.o. female and is here for a comprehensive physical exam. The patient reports problems - edema b/l low ext--no cp or sob.  History   Social History  . Marital Status: Single    Spouse Name: N/A  . Number of Children: N/A  . Years of Education: N/A   Occupational History  . [employer]    Social History Main Topics  . Smoking status: Never Smoker   . Smokeless tobacco: Never Used  . Alcohol Use: No  . Drug Use: No  . Sexual Activity:    Partners: Male   Other Topics Concern  . Not on file   Social History Narrative   Exercise-- no   Health Maintenance  Topic Date Due  . PAP SMEAR  01/07/2015  . HIV Screening  02/08/2016 (Originally 01/31/1971)  . INFLUENZA VACCINE  03/18/2015  . MAMMOGRAM  06/13/2015  . COLONOSCOPY  09/18/2018  . TETANUS/TDAP  11/16/2019    The following portions of the patient's history were reviewed and updated as appropriate:  She  has a past medical history of Thyroid disease; Hypothyroidism; Heart murmur; and Hypertension. She  does not have any pertinent problems on file. She  has past surgical history that includes Lumbar laminectomy; Foot surgery; and Abdominal hysterectomy (02/2012). Her family history includes Breast cancer (age of onset: 39) in her maternal aunt; Cancer (age of onset: 31) in her maternal aunt; Diabetes in her mother; Heart disease (age of onset: 51) in her father; Hyperlipidemia in her mother; Hypertension in her father and mother; Hyperthyroidism in her sister; Hypothyroidism in her sister. She  reports that she has never smoked. She has never used smokeless tobacco. She reports that she does not drink alcohol or use illicit drugs. She has a current medication list which includes the following prescription(s): carvedilol, levothyroxine, meloxicam, valacyclovir, epinephrine, and furosemide. Current Outpatient Prescriptions on File Prior to Visit  Medication Sig Dispense  Refill  . carvedilol (COREG) 25 MG tablet TAKE 1 TABLET(25 MG) BY MOUTH TWICE DAILY WITH A MEAL 60 tablet 0  . EPINEPHrine (EPIPEN 2-PAK) 0.3 mg/0.3 mL SOAJ injection Inject 0.3 mLs (0.3 mg total) into the muscle as needed. For allergic reaction (Patient not taking: Reported on 02/08/2015) 1 Device 1   No current facility-administered medications on file prior to visit.   She is allergic to erythromycin; lisinopril; and penicillins..  Review of Systems Review of Systems  Constitutional: Negative for activity change, appetite change and fatigue.  HENT: Negative for hearing loss, congestion, tinnitus and ear discharge.  dentist q63m Eyes: Negative for visual disturbance (see optho q1y -- vision corrected to 20/20 with glasses).  Respiratory: Negative for cough, chest tightness and shortness of breath.   Cardiovascular: Negative for chest pain, palpitations and leg swelling.  Gastrointestinal: Negative for abdominal pain, diarrhea, constipation and abdominal distention.  Genitourinary: Negative for urgency, frequency, decreased urine volume and difficulty urinating.  Musculoskeletal: Negative for back pain, arthralgias and gait problem.  Skin: Negative for color change, pallor and rash.  Neurological: Negative for dizziness, light-headedness, numbness and headaches.  Hematological: Negative for adenopathy. Does not bruise/bleed easily.  Psychiatric/Behavioral: Negative for suicidal ideas, confusion, sleep disturbance, self-injury, dysphoric mood, decreased concentration and agitation.       Objective:    BP 144/82 mmHg  Pulse 67  Temp(Src) 98.1 F (36.7 C) (Oral)  Resp 18  Ht 5\' 5"  (1.651 m)  Wt 268 lb  6.4 oz (121.745 kg)  BMI 44.66 kg/m2  SpO2 99%  LMP 01/28/2012 General appearance: alert, cooperative, appears stated age and no distress Head: Normocephalic, without obvious abnormality, atraumatic Eyes: conjunctivae/corneas clear. PERRL, EOM's intact. Fundi benign. Ears: normal  TM's and external ear canals both ears -- + cerumen b/l  Nose: Nares normal. Septum midline. Mucosa normal. No drainage or sinus tenderness. Throat: lips, mucosa, and tongue normal; teeth and gums normal Neck: no adenopathy, no carotid bruit, no JVD, supple, symmetrical, trachea midline and thyroid not enlarged, symmetric, no tenderness/mass/nodules Back: symmetric, no curvature. ROM normal. No CVA tenderness. Lungs: clear to auscultation bilaterally Breasts: normal appearance, no masses or tenderness Heart: regular rate and rhythm, S1, S2 normal, no murmur, click, rub or gallop Abdomen: soft, non-tender; bowel sounds normal; no masses,  no organomegaly Pelvic: external genitalia normal, no adnexal masses or tenderness, rectovaginal septum normal, uterus surgically absent, vagina normal without discharge and vaginal smear done --rectal heme neg brown stool Extremities: extremities normal, atraumatic, no cyanosis or edema Pulses: 2+ and symmetric Skin: + dark mole bottom L foot --new and changing Lymph nodes: Cervical, supraclavicular, and axillary nodes normal. Neurologic: Alert and oriented X 3, normal strength and tone. Normal symmetric reflexes. Normal coordination and gait Psych- no depression, no anxiety      Assessment:    Healthy female exam.      Plan:  Ghm utd Check labs See AVS    See After Visit Summary for Counseling Recommendations   1. Primary osteoarthritis involving multiple joints   - meloxicam (MOBIC) 15 MG tablet; Take 1 tablet (15 mg total) by mouth daily as needed. For pain  Dispense: 30 tablet; Refill: 0  2. Hypothyroidism, unspecified hypothyroidism type   - levothyroxine (SYNTHROID, LEVOTHROID) 25 MCG tablet; Take 1 tablet (25 mcg total) by mouth daily.  Dispense: 30 tablet; Refill: 11  3. H/O cold sores   - valACYclovir (VALTREX) 1000 MG tablet; Take 1 tablet (1,000 mg total) by mouth 3 (three) times daily.  Dispense: 30 tablet; Refill: 11  4.  Edema  Elevated legs , drink water, aviod salt - furosemide (LASIX) 20 MG tablet; Take 1 tablet (20 mg total) by mouth daily.  Dispense: 30 tablet; Refill: 3  5. Breast cancer screening   - MM DIGITAL SCREENING BILATERAL; Future

## 2015-02-08 NOTE — Addendum Note (Signed)
Addended by: Ewing Schlein on: 02/08/2015 03:49 PM   Modules accepted: Orders

## 2015-02-08 NOTE — Patient Instructions (Signed)
Preventive Care for Adults A healthy lifestyle and preventive care can promote health and wellness. Preventive health guidelines for women include the following key practices.  A routine yearly physical is a good way to check with your health care provider about your health and preventive screening. It is a chance to share any concerns and updates on your health and to receive a thorough exam.  Visit your dentist for a routine exam and preventive care every 6 months. Brush your teeth twice a day and floss once a day. Good oral hygiene prevents tooth decay and gum disease.  The frequency of eye exams is based on your age, health, family medical history, use of contact lenses, and other factors. Follow your health care provider's recommendations for frequency of eye exams.  Eat a healthy diet. Foods like vegetables, fruits, whole grains, low-fat dairy products, and lean protein foods contain the nutrients you need without too many calories. Decrease your intake of foods high in solid fats, added sugars, and salt. Eat the right amount of calories for you.Get information about a proper diet from your health care provider, if necessary.  Regular physical exercise is one of the most important things you can do for your health. Most adults should get at least 150 minutes of moderate-intensity exercise (any activity that increases your heart rate and causes you to sweat) each week. In addition, most adults need muscle-strengthening exercises on 2 or more days a week.  Maintain a healthy weight. The body mass index (BMI) is a screening tool to identify possible weight problems. It provides an estimate of body fat based on height and weight. Your health care provider can find your BMI and can help you achieve or maintain a healthy weight.For adults 20 years and older:  A BMI below 18.5 is considered underweight.  A BMI of 18.5 to 24.9 is normal.  A BMI of 25 to 29.9 is considered overweight.  A BMI of  30 and above is considered obese.  Maintain normal blood lipids and cholesterol levels by exercising and minimizing your intake of saturated fat. Eat a balanced diet with plenty of fruit and vegetables. Blood tests for lipids and cholesterol should begin at age 76 and be repeated every 5 years. If your lipid or cholesterol levels are high, you are over 50, or you are at high risk for heart disease, you may need your cholesterol levels checked more frequently.Ongoing high lipid and cholesterol levels should be treated with medicines if diet and exercise are not working.  If you smoke, find out from your health care provider how to quit. If you do not use tobacco, do not start.  Lung cancer screening is recommended for adults aged 22-80 years who are at high risk for developing lung cancer because of a history of smoking. A yearly low-dose CT scan of the lungs is recommended for people who have at least a 30-pack-year history of smoking and are a current smoker or have quit within the past 15 years. A pack year of smoking is smoking an average of 1 pack of cigarettes a day for 1 year (for example: 1 pack a day for 30 years or 2 packs a day for 15 years). Yearly screening should continue until the smoker has stopped smoking for at least 15 years. Yearly screening should be stopped for people who develop a health problem that would prevent them from having lung cancer treatment.  If you are pregnant, do not drink alcohol. If you are breastfeeding,  be very cautious about drinking alcohol. If you are not pregnant and choose to drink alcohol, do not have more than 1 drink per day. One drink is considered to be 12 ounces (355 mL) of beer, 5 ounces (148 mL) of wine, or 1.5 ounces (44 mL) of liquor.  Avoid use of street drugs. Do not share needles with anyone. Ask for help if you need support or instructions about stopping the use of drugs.  High blood pressure causes heart disease and increases the risk of  stroke. Your blood pressure should be checked at least every 1 to 2 years. Ongoing high blood pressure should be treated with medicines if weight loss and exercise do not work.  If you are 75-52 years old, ask your health care provider if you should take aspirin to prevent strokes.  Diabetes screening involves taking a blood sample to check your fasting blood sugar level. This should be done once every 3 years, after age 15, if you are within normal weight and without risk factors for diabetes. Testing should be considered at a younger age or be carried out more frequently if you are overweight and have at least 1 risk factor for diabetes.  Breast cancer screening is essential preventive care for women. You should practice "breast self-awareness." This means understanding the normal appearance and feel of your breasts and may include breast self-examination. Any changes detected, no matter how small, should be reported to a health care provider. Women in their 58s and 30s should have a clinical breast exam (CBE) by a health care provider as part of a regular health exam every 1 to 3 years. After age 16, women should have a CBE every year. Starting at age 53, women should consider having a mammogram (breast X-ray test) every year. Women who have a family history of breast cancer should talk to their health care provider about genetic screening. Women at a high risk of breast cancer should talk to their health care providers about having an MRI and a mammogram every year.  Breast cancer gene (BRCA)-related cancer risk assessment is recommended for women who have family members with BRCA-related cancers. BRCA-related cancers include breast, ovarian, tubal, and peritoneal cancers. Having family members with these cancers may be associated with an increased risk for harmful changes (mutations) in the breast cancer genes BRCA1 and BRCA2. Results of the assessment will determine the need for genetic counseling and  BRCA1 and BRCA2 testing.  Routine pelvic exams to screen for cancer are no longer recommended for nonpregnant women who are considered low risk for cancer of the pelvic organs (ovaries, uterus, and vagina) and who do not have symptoms. Ask your health care provider if a screening pelvic exam is right for you.  If you have had past treatment for cervical cancer or a condition that could lead to cancer, you need Pap tests and screening for cancer for at least 20 years after your treatment. If Pap tests have been discontinued, your risk factors (such as having a new sexual partner) need to be reassessed to determine if screening should be resumed. Some women have medical problems that increase the chance of getting cervical cancer. In these cases, your health care provider may recommend more frequent screening and Pap tests.  The HPV test is an additional test that may be used for cervical cancer screening. The HPV test looks for the virus that can cause the cell changes on the cervix. The cells collected during the Pap test can be  tested for HPV. The HPV test could be used to screen women aged 30 years and older, and should be used in women of any age who have unclear Pap test results. After the age of 30, women should have HPV testing at the same frequency as a Pap test.  Colorectal cancer can be detected and often prevented. Most routine colorectal cancer screening begins at the age of 50 years and continues through age 75 years. However, your health care provider may recommend screening at an earlier age if you have risk factors for colon cancer. On a yearly basis, your health care provider may provide home test kits to check for hidden blood in the stool. Use of a small camera at the end of a tube, to directly examine the colon (sigmoidoscopy or colonoscopy), can detect the earliest forms of colorectal cancer. Talk to your health care provider about this at age 50, when routine screening begins. Direct  exam of the colon should be repeated every 5-10 years through age 75 years, unless early forms of pre-cancerous polyps or small growths are found.  People who are at an increased risk for hepatitis B should be screened for this virus. You are considered at high risk for hepatitis B if:  You were born in a country where hepatitis B occurs often. Talk with your health care provider about which countries are considered high risk.  Your parents were born in a high-risk country and you have not received a shot to protect against hepatitis B (hepatitis B vaccine).  You have HIV or AIDS.  You use needles to inject street drugs.  You live with, or have sex with, someone who has hepatitis B.  You get hemodialysis treatment.  You take certain medicines for conditions like cancer, organ transplantation, and autoimmune conditions.  Hepatitis C blood testing is recommended for all people born from 1945 through 1965 and any individual with known risks for hepatitis C.  Practice safe sex. Use condoms and avoid high-risk sexual practices to reduce the spread of sexually transmitted infections (STIs). STIs include gonorrhea, chlamydia, syphilis, trichomonas, herpes, HPV, and human immunodeficiency virus (HIV). Herpes, HIV, and HPV are viral illnesses that have no cure. They can result in disability, cancer, and death.  You should be screened for sexually transmitted illnesses (STIs) including gonorrhea and chlamydia if:  You are sexually active and are younger than 24 years.  You are older than 24 years and your health care provider tells you that you are at risk for this type of infection.  Your sexual activity has changed since you were last screened and you are at an increased risk for chlamydia or gonorrhea. Ask your health care provider if you are at risk.  If you are at risk of being infected with HIV, it is recommended that you take a prescription medicine daily to prevent HIV infection. This is  called preexposure prophylaxis (PrEP). You are considered at risk if:  You are a heterosexual woman, are sexually active, and are at increased risk for HIV infection.  You take drugs by injection.  You are sexually active with a partner who has HIV.  Talk with your health care provider about whether you are at high risk of being infected with HIV. If you choose to begin PrEP, you should first be tested for HIV. You should then be tested every 3 months for as long as you are taking PrEP.  Osteoporosis is a disease in which the bones lose minerals and strength   with aging. This can result in serious bone fractures or breaks. The risk of osteoporosis can be identified using a bone density scan. Women ages 65 years and over and women at risk for fractures or osteoporosis should discuss screening with their health care providers. Ask your health care provider whether you should take a calcium supplement or vitamin D to reduce the rate of osteoporosis.  Menopause can be associated with physical symptoms and risks. Hormone replacement therapy is available to decrease symptoms and risks. You should talk to your health care provider about whether hormone replacement therapy is right for you.  Use sunscreen. Apply sunscreen liberally and repeatedly throughout the day. You should seek shade when your shadow is shorter than you. Protect yourself by wearing long sleeves, pants, a wide-brimmed hat, and sunglasses year round, whenever you are outdoors.  Once a month, do a whole body skin exam, using a mirror to look at the skin on your back. Tell your health care provider of new moles, moles that have irregular borders, moles that are larger than a pencil eraser, or moles that have changed in shape or color.  Stay current with required vaccines (immunizations).  Influenza vaccine. All adults should be immunized every year.  Tetanus, diphtheria, and acellular pertussis (Td, Tdap) vaccine. Pregnant women should  receive 1 dose of Tdap vaccine during each pregnancy. The dose should be obtained regardless of the length of time since the last dose. Immunization is preferred during the 27th-36th week of gestation. An adult who has not previously received Tdap or who does not know her vaccine status should receive 1 dose of Tdap. This initial dose should be followed by tetanus and diphtheria toxoids (Td) booster doses every 10 years. Adults with an unknown or incomplete history of completing a 3-dose immunization series with Td-containing vaccines should begin or complete a primary immunization series including a Tdap dose. Adults should receive a Td booster every 10 years.  Varicella vaccine. An adult without evidence of immunity to varicella should receive 2 doses or a second dose if she has previously received 1 dose. Pregnant females who do not have evidence of immunity should receive the first dose after pregnancy. This first dose should be obtained before leaving the health care facility. The second dose should be obtained 4-8 weeks after the first dose.  Human papillomavirus (HPV) vaccine. Females aged 13-26 years who have not received the vaccine previously should obtain the 3-dose series. The vaccine is not recommended for use in pregnant females. However, pregnancy testing is not needed before receiving a dose. If a female is found to be pregnant after receiving a dose, no treatment is needed. In that case, the remaining doses should be delayed until after the pregnancy. Immunization is recommended for any person with an immunocompromised condition through the age of 26 years if she did not get any or all doses earlier. During the 3-dose series, the second dose should be obtained 4-8 weeks after the first dose. The third dose should be obtained 24 weeks after the first dose and 16 weeks after the second dose.  Zoster vaccine. One dose is recommended for adults aged 60 years or older unless certain conditions are  present.  Measles, mumps, and rubella (MMR) vaccine. Adults born before 1957 generally are considered immune to measles and mumps. Adults born in 1957 or later should have 1 or more doses of MMR vaccine unless there is a contraindication to the vaccine or there is laboratory evidence of immunity to   each of the three diseases. A routine second dose of MMR vaccine should be obtained at least 28 days after the first dose for students attending postsecondary schools, health care workers, or international travelers. People who received inactivated measles vaccine or an unknown type of measles vaccine during 1963-1967 should receive 2 doses of MMR vaccine. People who received inactivated mumps vaccine or an unknown type of mumps vaccine before 1979 and are at high risk for mumps infection should consider immunization with 2 doses of MMR vaccine. For females of childbearing age, rubella immunity should be determined. If there is no evidence of immunity, females who are not pregnant should be vaccinated. If there is no evidence of immunity, females who are pregnant should delay immunization until after pregnancy. Unvaccinated health care workers born before 1957 who lack laboratory evidence of measles, mumps, or rubella immunity or laboratory confirmation of disease should consider measles and mumps immunization with 2 doses of MMR vaccine or rubella immunization with 1 dose of MMR vaccine.  Pneumococcal 13-valent conjugate (PCV13) vaccine. When indicated, a person who is uncertain of her immunization history and has no record of immunization should receive the PCV13 vaccine. An adult aged 19 years or older who has certain medical conditions and has not been previously immunized should receive 1 dose of PCV13 vaccine. This PCV13 should be followed with a dose of pneumococcal polysaccharide (PPSV23) vaccine. The PPSV23 vaccine dose should be obtained at least 8 weeks after the dose of PCV13 vaccine. An adult aged 19  years or older who has certain medical conditions and previously received 1 or more doses of PPSV23 vaccine should receive 1 dose of PCV13. The PCV13 vaccine dose should be obtained 1 or more years after the last PPSV23 vaccine dose.  Pneumococcal polysaccharide (PPSV23) vaccine. When PCV13 is also indicated, PCV13 should be obtained first. All adults aged 65 years and older should be immunized. An adult younger than age 65 years who has certain medical conditions should be immunized. Any person who resides in a nursing home or long-term care facility should be immunized. An adult smoker should be immunized. People with an immunocompromised condition and certain other conditions should receive both PCV13 and PPSV23 vaccines. People with human immunodeficiency virus (HIV) infection should be immunized as soon as possible after diagnosis. Immunization during chemotherapy or radiation therapy should be avoided. Routine use of PPSV23 vaccine is not recommended for American Indians, [place] Natives, or people younger than 65 years unless there are medical conditions that require PPSV23 vaccine. When indicated, people who have unknown immunization and have no record of immunization should receive PPSV23 vaccine. One-time revaccination 5 years after the first dose of PPSV23 is recommended for people aged 19-64 years who have chronic kidney failure, nephrotic syndrome, asplenia, or immunocompromised conditions. People who received 1-2 doses of PPSV23 before age 65 years should receive another dose of PPSV23 vaccine at age 65 years or later if at least 5 years have passed since the previous dose. Doses of PPSV23 are not needed for people immunized with PPSV23 at or after age 65 years.  Meningococcal vaccine. Adults with asplenia or persistent complement component deficiencies should receive 2 doses of quadrivalent meningococcal conjugate (MenACWY-D) vaccine. The doses should be obtained at least 2 months apart.  Microbiologists working with certain meningococcal bacteria, military recruits, people at risk during an outbreak, and people who travel to or live in countries with a high rate of meningitis should be immunized. A first-year college student up through age   21 years who is living in a residence hall should receive a dose if she did not receive a dose on or after her 16th birthday. Adults who have certain high-risk conditions should receive one or more doses of vaccine.  Hepatitis A vaccine. Adults who wish to be protected from this disease, have certain high-risk conditions, work with hepatitis A-infected animals, work in hepatitis A research labs, or travel to or work in countries with a high rate of hepatitis A should be immunized. Adults who were previously unvaccinated and who anticipate close contact with an international adoptee during the first 60 days after arrival in the [place] from a country with a high rate of hepatitis A should be immunized.  Hepatitis B vaccine. Adults who wish to be protected from this disease, have certain high-risk conditions, may be exposed to blood or other infectious body fluids, are household contacts or sex partners of hepatitis B positive people, are clients or workers in certain care facilities, or travel to or work in countries with a high rate of hepatitis B should be immunized.  Haemophilus influenzae type b (Hib) vaccine. A previously unvaccinated person with asplenia or sickle cell disease or having a scheduled splenectomy should receive 1 dose of Hib vaccine. Regardless of previous immunization, a recipient of a hematopoietic stem cell transplant should receive a 3-dose series 6-12 months after her successful transplant. Hib vaccine is not recommended for adults with HIV infection. Preventive Services / Frequency Ages 64 to 68 years  Blood pressure check.** / Every 1 to 2 years.  Lipid and cholesterol check.** / Every 5 years beginning at age  22.  Clinical breast exam.** / Every 3 years for women in their 88s and 53s.  BRCA-related cancer risk assessment.** / For women who have family members with a BRCA-related cancer (breast, ovarian, tubal, or peritoneal cancers).  Pap test.** / Every 2 years from ages 90 through 51. Every 3 years starting at age 21 through age 56 or 3 with a history of 3 consecutive normal Pap tests.  HPV screening.** / Every 3 years from ages 24 through ages 1 to 46 with a history of 3 consecutive normal Pap tests.  Hepatitis C blood test.** / For any individual with known risks for hepatitis C.  Skin self-exam. / Monthly.  Influenza vaccine. / Every year.  Tetanus, diphtheria, and acellular pertussis (Tdap, Td) vaccine.** / Consult your health care provider. Pregnant women should receive 1 dose of Tdap vaccine during each pregnancy. 1 dose of Td every 10 years.  Varicella vaccine.** / Consult your health care provider. Pregnant females who do not have evidence of immunity should receive the first dose after pregnancy.  HPV vaccine. / 3 doses over 6 months, if 72 and younger. The vaccine is not recommended for use in pregnant females. However, pregnancy testing is not needed before receiving a dose.  Measles, mumps, rubella (MMR) vaccine.** / You need at least 1 dose of MMR if you were born in 1957 or later. You may also need a 2nd dose. For females of childbearing age, rubella immunity should be determined. If there is no evidence of immunity, females who are not pregnant should be vaccinated. If there is no evidence of immunity, females who are pregnant should delay immunization until after pregnancy.  Pneumococcal 13-valent conjugate (PCV13) vaccine.** / Consult your health care provider.  Pneumococcal polysaccharide (PPSV23) vaccine.** / 1 to 2 doses if you smoke cigarettes or if you have certain conditions.  Meningococcal vaccine.** /  1 dose if you are age 19 to 21 years and a first-year college  student living in a residence hall, or have one of several medical conditions, you need to get vaccinated against meningococcal disease. You may also need additional booster doses.  Hepatitis A vaccine.** / Consult your health care provider.  Hepatitis B vaccine.** / Consult your health care provider.  Haemophilus influenzae type b (Hib) vaccine.** / Consult your health care provider. Ages 40 to 64 years  Blood pressure check.** / Every 1 to 2 years.  Lipid and cholesterol check.** / Every 5 years beginning at age 20 years.  Lung cancer screening. / Every year if you are aged 55-80 years and have a 30-pack-year history of smoking and currently smoke or have quit within the past 15 years. Yearly screening is stopped once you have quit smoking for at least 15 years or develop a health problem that would prevent you from having lung cancer treatment.  Clinical breast exam.** / Every year after age 40 years.  BRCA-related cancer risk assessment.** / For women who have family members with a BRCA-related cancer (breast, ovarian, tubal, or peritoneal cancers).  Mammogram.** / Every year beginning at age 40 years and continuing for as long as you are in good health. Consult with your health care provider.  Pap test.** / Every 3 years starting at age 30 years through age 65 or 70 years with a history of 3 consecutive normal Pap tests.  HPV screening.** / Every 3 years from ages 30 years through ages 65 to 70 years with a history of 3 consecutive normal Pap tests.  Fecal occult blood test (FOBT) of stool. / Every year beginning at age 50 years and continuing until age 75 years. You may not need to do this test if you get a colonoscopy every 10 years.  Flexible sigmoidoscopy or colonoscopy.** / Every 5 years for a flexible sigmoidoscopy or every 10 years for a colonoscopy beginning at age 50 years and continuing until age 75 years.  Hepatitis C blood test.** / For all people born from 1945 through  1965 and any individual with known risks for hepatitis C.  Skin self-exam. / Monthly.  Influenza vaccine. / Every year.  Tetanus, diphtheria, and acellular pertussis (Tdap/Td) vaccine.** / Consult your health care provider. Pregnant women should receive 1 dose of Tdap vaccine during each pregnancy. 1 dose of Td every 10 years.  Varicella vaccine.** / Consult your health care provider. Pregnant females who do not have evidence of immunity should receive the first dose after pregnancy.  Zoster vaccine.** / 1 dose for adults aged 60 years or older.  Measles, mumps, rubella (MMR) vaccine.** / You need at least 1 dose of MMR if you were born in 1957 or later. You may also need a 2nd dose. For females of childbearing age, rubella immunity should be determined. If there is no evidence of immunity, females who are not pregnant should be vaccinated. If there is no evidence of immunity, females who are pregnant should delay immunization until after pregnancy.  Pneumococcal 13-valent conjugate (PCV13) vaccine.** / Consult your health care provider.  Pneumococcal polysaccharide (PPSV23) vaccine.** / 1 to 2 doses if you smoke cigarettes or if you have certain conditions.  Meningococcal vaccine.** / Consult your health care provider.  Hepatitis A vaccine.** / Consult your health care provider.  Hepatitis B vaccine.** / Consult your health care provider.  Haemophilus influenzae type b (Hib) vaccine.** / Consult your health care provider. Ages 65   years and over  Blood pressure check.** / Every 1 to 2 years.  Lipid and cholesterol check.** / Every 5 years beginning at age 22 years.  Lung cancer screening. / Every year if you are aged 73-80 years and have a 30-pack-year history of smoking and currently smoke or have quit within the past 15 years. Yearly screening is stopped once you have quit smoking for at least 15 years or develop a health problem that would prevent you from having lung cancer  treatment.  Clinical breast exam.** / Every year after age 4 years.  BRCA-related cancer risk assessment.** / For women who have family members with a BRCA-related cancer (breast, ovarian, tubal, or peritoneal cancers).  Mammogram.** / Every year beginning at age 40 years and continuing for as long as you are in good health. Consult with your health care provider.  Pap test.** / Every 3 years starting at age 9 years through age 34 or 91 years with 3 consecutive normal Pap tests. Testing can be stopped between 65 and 70 years with 3 consecutive normal Pap tests and no abnormal Pap or HPV tests in the past 10 years.  HPV screening.** / Every 3 years from ages 57 years through ages 64 or 45 years with a history of 3 consecutive normal Pap tests. Testing can be stopped between 65 and 70 years with 3 consecutive normal Pap tests and no abnormal Pap or HPV tests in the past 10 years.  Fecal occult blood test (FOBT) of stool. / Every year beginning at age 15 years and continuing until age 17 years. You may not need to do this test if you get a colonoscopy every 10 years.  Flexible sigmoidoscopy or colonoscopy.** / Every 5 years for a flexible sigmoidoscopy or every 10 years for a colonoscopy beginning at age 86 years and continuing until age 71 years.  Hepatitis C blood test.** / For all people born from [birth year] through 1963 and any individual with known risks for hepatitis C.  Osteoporosis screening.** / A one-time screening for women ages 83 years and over and women at risk for fractures or osteoporosis.  Skin self-exam. / Monthly.  Influenza vaccine. / Every year.  Tetanus, diphtheria, and acellular pertussis (Tdap/Td) vaccine.** / 1 dose of Td every 10 years.  Varicella vaccine.** / Consult your health care provider.  Zoster vaccine.** / 1 dose for adults aged 61 years or older.  Pneumococcal 13-valent conjugate (PCV13) vaccine.** / Consult your health care provider.  Pneumococcal  polysaccharide (PPSV23) vaccine.** / 1 dose for all adults aged 28 years and older.  Meningococcal vaccine.** / Consult your health care provider.  Hepatitis A vaccine.** / Consult your health care provider.  Hepatitis B vaccine.** / Consult your health care provider.  Haemophilus influenzae type b (Hib) vaccine.** / Consult your health care provider. ** Family history and personal history of risk and conditions may change your health care provider's recommendations. Document Released: 09/29/2001 Document Revised: 12/18/2013 Document Reviewed: 12/29/2010 [facility] Patient Information 2015 [place], [place]. This information is not intended to replace advice given to you by your health care provider. Make sure you discuss any questions you have with your health care provider.

## 2015-02-08 NOTE — Addendum Note (Signed)
Addended by: Elizabeth Sauer on: 02/08/2015 02:23 PM   Modules accepted: Orders

## 2015-02-08 NOTE — Addendum Note (Signed)
Addended by: Modena Morrow D on: 02/08/2015 03:02 PM   Modules accepted: Orders

## 2015-02-08 NOTE — Addendum Note (Signed)
Addended by: Modena Morrow D on: 02/08/2015 02:57 PM   Modules accepted: Orders

## 2015-02-09 LAB — BASIC METABOLIC PANEL
BUN: 9 mg/dL (ref 6–23)
CALCIUM: 8.8 mg/dL (ref 8.4–10.5)
CHLORIDE: 103 meq/L (ref 96–112)
CO2: 24 meq/L (ref 19–32)
CREATININE: 0.64 mg/dL (ref 0.50–1.10)
Glucose, Bld: 65 mg/dL — ABNORMAL LOW (ref 70–99)
POTASSIUM: 3.6 meq/L (ref 3.5–5.3)
Sodium: 140 mEq/L (ref 135–145)

## 2015-02-09 LAB — LIPID PANEL
CHOL/HDL RATIO: 2.6 ratio
Cholesterol: 193 mg/dL (ref 0–200)
HDL: 75 mg/dL (ref >=46)
LDL Cholesterol: 107 mg/dL — ABNORMAL HIGH (ref 0–99)
Triglycerides: 54 mg/dL (ref <150)
VLDL: 11 mg/dL (ref 0–40)

## 2015-02-09 LAB — HEPATIC FUNCTION PANEL
ALK PHOS: 69 U/L (ref 39–117)
ALT: 14 U/L (ref 0–35)
AST: 16 U/L (ref 0–37)
Albumin: 3.5 g/dL (ref 3.5–5.2)
BILIRUBIN DIRECT: 0.2 mg/dL (ref 0.0–0.3)
BILIRUBIN TOTAL: 0.9 mg/dL (ref 0.2–1.2)
Indirect Bilirubin: 0.7 mg/dL (ref 0.2–1.2)
Total Protein: 7 g/dL (ref 6.0–8.3)

## 2015-02-09 LAB — TSH: TSH: 5.154 u[IU]/mL — AB (ref 0.350–4.500)

## 2015-02-12 ENCOUNTER — Other Ambulatory Visit: Payer: Self-pay

## 2015-02-12 DIAGNOSIS — E039 Hypothyroidism, unspecified: Secondary | ICD-10-CM

## 2015-02-12 LAB — CYTOLOGY - PAP

## 2015-02-12 MED ORDER — LEVOTHYROXINE SODIUM 50 MCG PO TABS: 50.0000 ug | ORAL_TABLET | Freq: Every day | ORAL | Status: DC

## 2015-02-28 ENCOUNTER — Other Ambulatory Visit: Payer: Self-pay

## 2015-03-11 ENCOUNTER — Other Ambulatory Visit: Payer: Self-pay | Admitting: Family Medicine

## 2015-04-17 ENCOUNTER — Other Ambulatory Visit: Payer: Self-pay | Admitting: Family Medicine

## 2015-04-17 NOTE — Telephone Encounter (Signed)
Medication filled to pharmacy as requested.   

## 2015-05-20 ENCOUNTER — Other Ambulatory Visit: Payer: Self-pay | Admitting: Family Medicine

## 2015-05-20 DIAGNOSIS — E039 Hypothyroidism, unspecified: Secondary | ICD-10-CM

## 2015-06-13 ENCOUNTER — Telehealth: Payer: Self-pay

## 2015-06-13 MED ORDER — FUROSEMIDE 20 MG PO TABS: 20.0000 mg | ORAL_TABLET | Freq: Every day | ORAL | Status: DC

## 2015-06-13 NOTE — Telephone Encounter (Signed)
Rx sent      KP 

## 2015-06-22 ENCOUNTER — Other Ambulatory Visit: Payer: Self-pay | Admitting: Family Medicine

## 2015-07-12 ENCOUNTER — Telehealth: Payer: Self-pay

## 2015-07-12 ENCOUNTER — Other Ambulatory Visit: Payer: Self-pay | Admitting: Family Medicine

## 2015-07-12 NOTE — Telephone Encounter (Signed)
LVM for pt to call back as soon as possible.   RE: TSH labs are due for additional refills of pt levothyroxine.

## 2015-08-09 ENCOUNTER — Encounter: Payer: Self-pay | Admitting: Family Medicine

## 2015-08-17 ENCOUNTER — Other Ambulatory Visit: Payer: Self-pay | Admitting: Family Medicine

## 2015-08-28 ENCOUNTER — Other Ambulatory Visit: Payer: Self-pay | Admitting: Family Medicine

## 2015-09-02 ENCOUNTER — Other Ambulatory Visit (INDEPENDENT_AMBULATORY_CARE_PROVIDER_SITE_OTHER): Payer: BC Managed Care – PPO

## 2015-09-02 DIAGNOSIS — E039 Hypothyroidism, unspecified: Secondary | ICD-10-CM | POA: Diagnosis not present

## 2015-09-02 LAB — TSH: TSH: 5.16 u[IU]/mL — AB (ref 0.35–4.50)

## 2015-09-04 ENCOUNTER — Telehealth: Payer: Self-pay | Admitting: Family Medicine

## 2015-09-04 ENCOUNTER — Other Ambulatory Visit: Payer: Self-pay

## 2015-09-04 MED ORDER — SYNTHROID 100 MCG PO TABS: 100.0000 ug | ORAL_TABLET | Freq: Every day | ORAL | Status: DC

## 2015-09-04 NOTE — Telephone Encounter (Signed)
Pt says that she is returning your call about lab results.   CB:  [phone]

## 2015-09-05 ENCOUNTER — Other Ambulatory Visit: Payer: Self-pay

## 2015-09-05 MED ORDER — MELOXICAM 15 MG PO TABS: 15.0000 mg | ORAL_TABLET | Freq: Every day | ORAL | Status: DC | PRN

## 2015-09-05 NOTE — Telephone Encounter (Signed)
Notes Recorded by Rosalita Chessman, DO on 09/02/2015 at 1:18 PM Increase synthroid to 100 mcg daily and recheck in 2 months   Patient aware and voiced understanding. Synthroid faxed.    KP

## 2015-10-29 ENCOUNTER — Other Ambulatory Visit: Payer: Self-pay | Admitting: Family Medicine

## 2015-10-29 DIAGNOSIS — E0590 Thyrotoxicosis, unspecified without thyrotoxic crisis or storm: Secondary | ICD-10-CM

## 2015-10-29 NOTE — Telephone Encounter (Signed)
Please schedule the patient a Lab apt for a TSH. Orders are in.     KP

## 2015-10-30 NOTE — Telephone Encounter (Signed)
Called patient to inform her of below. Patient states she has an appt with Dr. Etter Sjogren on Monday 3/20 and she will get her labs then

## 2015-11-01 ENCOUNTER — Other Ambulatory Visit (INDEPENDENT_AMBULATORY_CARE_PROVIDER_SITE_OTHER): Payer: BC Managed Care – PPO

## 2015-11-01 DIAGNOSIS — E0590 Thyrotoxicosis, unspecified without thyrotoxic crisis or storm: Secondary | ICD-10-CM | POA: Diagnosis not present

## 2015-11-01 LAB — TSH: TSH: 1.75 u[IU]/mL (ref 0.35–4.50)

## 2015-11-04 ENCOUNTER — Encounter: Payer: Self-pay | Admitting: Family Medicine

## 2015-11-04 ENCOUNTER — Ambulatory Visit (INDEPENDENT_AMBULATORY_CARE_PROVIDER_SITE_OTHER): Payer: BC Managed Care – PPO | Admitting: Family Medicine

## 2015-11-04 VITALS — BP 140/70 | HR 49 | Temp 98.0°F | Ht 65.0 in | Wt 276.2 lb

## 2015-11-04 DIAGNOSIS — Z9109 Other allergy status, other than to drugs and biological substances: Secondary | ICD-10-CM

## 2015-11-04 DIAGNOSIS — Z1231 Encounter for screening mammogram for malignant neoplasm of breast: Secondary | ICD-10-CM | POA: Diagnosis not present

## 2015-11-04 DIAGNOSIS — E039 Hypothyroidism, unspecified: Secondary | ICD-10-CM

## 2015-11-04 DIAGNOSIS — Z889 Allergy status to unspecified drugs, medicaments and biological substances status: Secondary | ICD-10-CM

## 2015-11-04 DIAGNOSIS — I10 Essential (primary) hypertension: Secondary | ICD-10-CM

## 2015-11-04 DIAGNOSIS — Z833 Family history of diabetes mellitus: Secondary | ICD-10-CM | POA: Diagnosis not present

## 2015-11-04 LAB — COMPREHENSIVE METABOLIC PANEL
ALBUMIN: 3.8 g/dL (ref 3.5–5.2)
ALK PHOS: 72 U/L (ref 39–117)
ALT: 11 U/L (ref 0–35)
AST: 14 U/L (ref 0–37)
BUN: 13 mg/dL (ref 6–23)
CHLORIDE: 103 meq/L (ref 96–112)
CO2: 31 mEq/L (ref 19–32)
Calcium: 9 mg/dL (ref 8.4–10.5)
Creatinine, Ser: 0.6 mg/dL (ref 0.40–1.20)
GFR: 132.16 mL/min (ref >60.00)
GLUCOSE: 97 mg/dL (ref 70–99)
POTASSIUM: 4.1 meq/L (ref 3.5–5.1)
SODIUM: 139 meq/L (ref 135–145)
TOTAL PROTEIN: 7.2 g/dL (ref 6.0–8.3)
Total Bilirubin: 0.8 mg/dL (ref 0.2–1.2)

## 2015-11-04 LAB — LIPID PANEL
CHOL/HDL RATIO: 3
Cholesterol: 167 mg/dL (ref 0–200)
HDL: 59.9 mg/dL (ref >39.00)
LDL CALC: 99 mg/dL (ref 0–99)
NonHDL: 107.16
Triglycerides: 40 mg/dL (ref 0.0–149.0)
VLDL: 8 mg/dL (ref 0.0–40.0)

## 2015-11-04 LAB — HEMOGLOBIN A1C: HEMOGLOBIN A1C: 6 % (ref 4.6–6.5)

## 2015-11-04 MED ORDER — EPINEPHRINE 0.3 MG/0.3ML IJ SOAJ: 0.3000 mg | INTRAMUSCULAR | Status: DC | PRN

## 2015-11-04 MED ORDER — SYNTHROID 100 MCG PO TABS: ORAL_TABLET | ORAL | Status: DC

## 2015-11-04 MED ORDER — CARVEDILOL 25 MG PO TABS: ORAL_TABLET | ORAL | Status: DC

## 2015-11-04 MED ORDER — FUROSEMIDE 20 MG PO TABS: 20.0000 mg | ORAL_TABLET | Freq: Every day | ORAL | Status: DC

## 2015-11-04 NOTE — Progress Notes (Signed)
Patient ID: Kayla Banks, female    DOB: 08-20-1955  Age: 60 y.o. MRN: [id number]    Subjective:  Subjective HPI Kayla Banks presents for f/u thyroid and bp.   No complaints.    Review of Systems  Constitutional: Negative for diaphoresis, appetite change, fatigue and unexpected weight change.  Eyes: Negative for pain, redness and visual disturbance.  Respiratory: Negative for cough, chest tightness, shortness of breath and wheezing.   Cardiovascular: Negative for chest pain, palpitations and leg swelling.  Endocrine: Negative for cold intolerance, heat intolerance, polydipsia, polyphagia and polyuria.  Genitourinary: Negative for dysuria, frequency and difficulty urinating.  Neurological: Negative for dizziness, light-headedness, numbness and headaches.    History Past Medical History  Diagnosis Date  . Thyroid disease     Hypothyroidism  . Hypothyroidism   . Heart murmur   . Hypertension     on meds    She has past surgical history that includes Lumbar laminectomy; Foot surgery; and Abdominal hysterectomy (02/2012).   Her family history includes Breast cancer (age of onset: 70) in her maternal aunt; Cancer (age of onset: 29) in her maternal aunt; Diabetes in her mother; Heart disease (age of onset: 92) in her father; Hyperlipidemia in her mother; Hypertension in her father and mother; Hyperthyroidism in her sister; Hypothyroidism in her sister.She reports that she has never smoked. She has never used smokeless tobacco. She reports that she does not drink alcohol or use illicit drugs.  Current Outpatient Prescriptions on File Prior to Visit  Medication Sig Dispense Refill  . meloxicam (MOBIC) 15 MG tablet Take 1 tablet (15 mg total) by mouth daily as needed. for pain 30 tablet 2  . valACYclovir (VALTREX) 1000 MG tablet Take 1 tablet (1,000 mg total) by mouth 3 (three) times daily. 30 tablet 11   No current facility-administered medications on file prior to visit.       Objective:  Objective Physical Exam  Constitutional: She is oriented to person, place, and time. She appears well-developed and well-nourished.  HENT:  Head: Normocephalic and atraumatic.  Eyes: Conjunctivae and EOM are normal.  Neck: Normal range of motion. Neck supple. No JVD present. Carotid bruit is not present. No thyromegaly present.  Cardiovascular: Normal rate, regular rhythm and normal heart sounds.   No murmur heard. Pulmonary/Chest: Effort normal and breath sounds normal. No respiratory distress. She has no wheezes. She has no rales. She exhibits no tenderness.  Musculoskeletal: She exhibits no edema.  Neurological: She is alert and oriented to person, place, and time.  Psychiatric: She has a normal mood and affect. Her behavior is normal.  Nursing note and vitals reviewed.  BP 140/70 mmHg  Pulse 49  Temp(Src) 98 F (36.7 C) (Oral)  Ht '5\' 5"'$  (1.651 m)  Wt 276 lb 3.2 oz (125.283 kg)  BMI 45.96 kg/m2  SpO2 99%  LMP 01/28/2012 Wt Readings from Last 3 Encounters:  11/04/15 276 lb 3.2 oz (125.283 kg)  02/08/15 268 lb 6.4 oz (121.745 kg)  01/30/14 253 lb 6.4 oz (114.941 kg)     Lab Results  Component Value Date   WBC 9.6 02/08/2015   HGB 12.5 02/08/2015   HCT 37.9 02/08/2015   PLT 306 02/08/2015   GLUCOSE 97 11/04/2015   CHOL 167 11/04/2015   TRIG 40.0 11/04/2015   HDL 59.90 11/04/2015   LDLCALC 99 11/04/2015   ALT 11 11/04/2015   AST 14 11/04/2015   NA 139 11/04/2015   K 4.1 11/04/2015   CL 103  11/04/2015   CREATININE 0.60 11/04/2015   BUN 13 11/04/2015   CO2 31 11/04/2015   TSH 1.75 11/01/2015   HGBA1C 6.0 11/04/2015    No results found.   Assessment & Plan:  Plan I have changed Kayla Banks's carvedilol. I am also having her maintain her valACYclovir, meloxicam, EPINEPHrine, furosemide, and SYNTHROID.  Meds ordered this encounter  Medications  . DISCONTD: SYNTHROID 100 MCG tablet    Sig: TAKE 1 TABLET BY MOUTH DAILY BEFORE BREAKFAST     Dispense:  30 tablet    Refill:  5  . EPINEPHrine (EPIPEN 2-PAK) 0.3 mg/0.3 mL IJ SOAJ injection    Sig: Inject 0.3 mLs (0.3 mg total) into the muscle as needed. For allergic reaction    Dispense:  1 Device    Refill:  1  . carvedilol (COREG) 25 MG tablet    Sig: TAKE 1 TABLET BY MOUTH TWICE DAILY WITH MEAL,    Dispense:  180 tablet    Refill:  1  . furosemide (LASIX) 20 MG tablet    Sig: Take 1 tablet (20 mg total) by mouth daily.    Dispense:  90 tablet    Refill:  1  . SYNTHROID 100 MCG tablet    Sig: TAKE 1 TABLET BY MOUTH DAILY BEFORE BREAKFAST    Dispense:  90 tablet    Refill:  1    Please discontinue synthroid 100 mcg #30  ----change to #90    Problem List Items Addressed This Visit    None    Visit Diagnoses    Visit for screening mammogram    -  Primary    Relevant Orders    MM DIGITAL SCREENING BILATERAL    Hypothyroidism, unspecified hypothyroidism type        Relevant Medications    carvedilol (COREG) 25 MG tablet    SYNTHROID 100 MCG tablet    Multiple allergies        Relevant Medications    EPINEPHrine (EPIPEN 2-PAK) 0.3 mg/0.3 mL IJ SOAJ injection    Essential hypertension        Relevant Medications    EPINEPHrine (EPIPEN 2-PAK) 0.3 mg/0.3 mL IJ SOAJ injection    carvedilol (COREG) 25 MG tablet    furosemide (LASIX) 20 MG tablet    Other Relevant Orders    Lipid panel (Completed)    Hemoglobin A1c (Completed)    Comp Met (CMET) (Completed)    Family history of diabetes mellitus        Relevant Orders    Lipid panel (Completed)    Hemoglobin A1c (Completed)    Comp Met (CMET) (Completed)       Follow-up: Return in about 3 months (around 02/04/2016), or if symptoms worsen or fail to improve, for annual exam, fasting.  Garnet Koyanagi, DO     ++

## 2015-11-04 NOTE — Progress Notes (Signed)
Pre visit review using our clinic review tool, if applicable. No additional management support is needed unless otherwise documented below in the visit note. 

## 2015-11-04 NOTE — Patient Instructions (Signed)

## 2015-11-06 NOTE — Assessment & Plan Note (Signed)
Stable con't meds 

## 2015-11-06 NOTE — Assessment & Plan Note (Signed)
con't  Synthroid Check labs

## 2015-11-19 ENCOUNTER — Ambulatory Visit
Admission: RE | Admit: 2015-11-19 | Discharge: 2015-11-19 | Disposition: A | Discharge status: Home / Self Care | Payer: BC Managed Care – PPO | Source: Ambulatory Visit | Attending: Family Medicine | Admitting: Family Medicine

## 2015-11-19 DIAGNOSIS — Z1231 Encounter for screening mammogram for malignant neoplasm of breast: Secondary | ICD-10-CM

## 2015-11-20 ENCOUNTER — Other Ambulatory Visit: Payer: Self-pay | Admitting: Family Medicine

## 2015-11-20 DIAGNOSIS — R928 Other abnormal and inconclusive findings on diagnostic imaging of breast: Secondary | ICD-10-CM

## 2015-11-28 ENCOUNTER — Other Ambulatory Visit: Payer: Self-pay | Admitting: Family Medicine

## 2015-11-28 ENCOUNTER — Ambulatory Visit
Admission: RE | Admit: 2015-11-28 | Discharge: 2015-11-28 | Disposition: A | Discharge status: Home / Self Care | Payer: BC Managed Care – PPO | Source: Ambulatory Visit | Attending: Family Medicine | Admitting: Family Medicine

## 2015-11-28 DIAGNOSIS — R928 Other abnormal and inconclusive findings on diagnostic imaging of breast: Secondary | ICD-10-CM

## 2015-11-29 ENCOUNTER — Ambulatory Visit
Admission: RE | Admit: 2015-11-29 | Discharge: 2015-11-29 | Disposition: A | Discharge status: Home / Self Care | Payer: BC Managed Care – PPO | Source: Ambulatory Visit | Attending: Family Medicine | Admitting: Family Medicine

## 2015-11-29 ENCOUNTER — Other Ambulatory Visit: Payer: Self-pay | Admitting: Family Medicine

## 2015-11-29 DIAGNOSIS — R921 Mammographic calcification found on diagnostic imaging of breast: Secondary | ICD-10-CM

## 2015-11-29 DIAGNOSIS — R928 Other abnormal and inconclusive findings on diagnostic imaging of breast: Secondary | ICD-10-CM

## 2015-12-30 ENCOUNTER — Other Ambulatory Visit: Payer: Self-pay | Admitting: Family Medicine

## 2016-01-10 ENCOUNTER — Other Ambulatory Visit: Payer: Self-pay | Admitting: Family Medicine

## 2016-01-10 NOTE — Telephone Encounter (Signed)
Last filled:  09/05/15 Amt: 30, 2 Last OV:  11/04/15  Med filled.

## 2016-02-19 ENCOUNTER — Other Ambulatory Visit: Payer: Self-pay | Admitting: Family Medicine

## 2016-03-05 ENCOUNTER — Other Ambulatory Visit (HOSPITAL_COMMUNITY)
Admission: RE | Admit: 2016-03-05 | Discharge: 2016-03-05 | Disposition: A | Discharge status: Home / Self Care | Payer: BC Managed Care – PPO | Source: Ambulatory Visit | Attending: Family Medicine | Admitting: Family Medicine

## 2016-03-05 ENCOUNTER — Ambulatory Visit (INDEPENDENT_AMBULATORY_CARE_PROVIDER_SITE_OTHER): Payer: BC Managed Care – PPO | Admitting: Family Medicine

## 2016-03-05 ENCOUNTER — Encounter: Payer: Self-pay | Admitting: Family Medicine

## 2016-03-05 VITALS — BP 128/82 | HR 70 | Temp 98.2°F | Ht 65.0 in | Wt 273.1 lb

## 2016-03-05 DIAGNOSIS — E785 Hyperlipidemia, unspecified: Secondary | ICD-10-CM | POA: Diagnosis not present

## 2016-03-05 DIAGNOSIS — Z113 Encounter for screening for infections with a predominantly sexual mode of transmission: Secondary | ICD-10-CM | POA: Diagnosis not present

## 2016-03-05 DIAGNOSIS — M170 Bilateral primary osteoarthritis of knee: Secondary | ICD-10-CM

## 2016-03-05 DIAGNOSIS — N760 Acute vaginitis: Secondary | ICD-10-CM | POA: Insufficient documentation

## 2016-03-05 DIAGNOSIS — Z8742 Personal history of other diseases of the female genital tract: Secondary | ICD-10-CM

## 2016-03-05 DIAGNOSIS — Z0000 Encounter for general adult medical examination without abnormal findings: Secondary | ICD-10-CM | POA: Diagnosis not present

## 2016-03-05 DIAGNOSIS — Z8619 Personal history of other infectious and parasitic diseases: Secondary | ICD-10-CM

## 2016-03-05 DIAGNOSIS — E039 Hypothyroidism, unspecified: Secondary | ICD-10-CM | POA: Diagnosis not present

## 2016-03-05 DIAGNOSIS — I10 Essential (primary) hypertension: Secondary | ICD-10-CM

## 2016-03-05 DIAGNOSIS — Z114 Encounter for screening for human immunodeficiency virus [HIV]: Secondary | ICD-10-CM

## 2016-03-05 DIAGNOSIS — Z1159 Encounter for screening for other viral diseases: Secondary | ICD-10-CM

## 2016-03-05 LAB — CBC WITH DIFFERENTIAL/PLATELET
Basophils Absolute: 0 10*3/uL (ref 0.0–0.1)
Basophils Relative: 0.3 % (ref 0.0–3.0)
EOS PCT: 2.1 % (ref 0.0–5.0)
Eosinophils Absolute: 0.2 10*3/uL (ref 0.0–0.7)
HCT: 39.9 % (ref 36.0–46.0)
HEMOGLOBIN: 13 g/dL (ref 12.0–15.0)
LYMPHS PCT: 18.4 % (ref 12.0–46.0)
Lymphs Abs: 1.9 10*3/uL (ref 0.7–4.0)
MCHC: 32.6 g/dL (ref 30.0–36.0)
MCV: 82.5 fl (ref 78.0–100.0)
MONO ABS: 0.5 10*3/uL (ref 0.1–1.0)
MONOS PCT: 5.2 % (ref 3.0–12.0)
Neutro Abs: 7.7 10*3/uL (ref 1.4–7.7)
Neutrophils Relative %: 74 % (ref 43.0–77.0)
Platelets: 286 10*3/uL (ref 150.0–400.0)
RBC: 4.84 Mil/uL (ref 3.87–5.11)
RDW: 15.4 % (ref 11.5–15.5)
WBC: 10.5 10*3/uL (ref 4.0–10.5)

## 2016-03-05 LAB — POCT URINALYSIS DIPSTICK
Bilirubin, UA: NEGATIVE
Glucose, UA: NEGATIVE
Ketones, UA: NEGATIVE
Leukocytes, UA: NEGATIVE
Nitrite, UA: NEGATIVE
PROTEIN UA: NEGATIVE
RBC UA: NEGATIVE
SPEC GRAV UA: 1.025
UROBILINOGEN UA: 0.2
pH, UA: 6

## 2016-03-05 LAB — LIPID PANEL
Cholesterol: 181 mg/dL (ref 0–200)
HDL: 65.4 mg/dL (ref >39.00)
LDL Cholesterol: 105 mg/dL — ABNORMAL HIGH (ref 0–99)
NONHDL: 115.62
Total CHOL/HDL Ratio: 3
Triglycerides: 52 mg/dL (ref 0.0–149.0)
VLDL: 10.4 mg/dL (ref 0.0–40.0)

## 2016-03-05 LAB — TSH: TSH: 1.07 u[IU]/mL (ref 0.35–4.50)

## 2016-03-05 LAB — COMPREHENSIVE METABOLIC PANEL
ALBUMIN: 4 g/dL (ref 3.5–5.2)
ALK PHOS: 73 U/L (ref 39–117)
ALT: 14 U/L (ref 0–35)
AST: 16 U/L (ref 0–37)
BUN: 15 mg/dL (ref 6–23)
CO2: 30 mEq/L (ref 19–32)
Calcium: 9.6 mg/dL (ref 8.4–10.5)
Chloride: 103 mEq/L (ref 96–112)
Creatinine, Ser: 0.71 mg/dL (ref 0.40–1.20)
GFR: 108.7 mL/min (ref >60.00)
Glucose, Bld: 94 mg/dL (ref 70–99)
POTASSIUM: 3.8 meq/L (ref 3.5–5.1)
SODIUM: 141 meq/L (ref 135–145)
TOTAL PROTEIN: 7.7 g/dL (ref 6.0–8.3)
Total Bilirubin: 1 mg/dL (ref 0.2–1.2)

## 2016-03-05 MED ORDER — MELOXICAM 15 MG PO TABS: ORAL_TABLET | ORAL | Status: DC

## 2016-03-05 MED ORDER — CARVEDILOL 25 MG PO TABS: ORAL_TABLET | ORAL | Status: DC

## 2016-03-05 MED ORDER — FUROSEMIDE 20 MG PO TABS: 20.0000 mg | ORAL_TABLET | Freq: Every day | ORAL | Status: DC

## 2016-03-05 MED ORDER — VALACYCLOVIR HCL 1 G PO TABS
ORAL_TABLET | ORAL | Status: DC
Start: 2016-03-05 — End: 2017-06-21

## 2016-03-05 MED ORDER — SYNTHROID 100 MCG PO TABS: ORAL_TABLET | ORAL | Status: DC

## 2016-03-05 NOTE — Patient Instructions (Signed)
Preventive Care for Adults, Female A healthy lifestyle and preventive care can promote health and wellness. Preventive health guidelines for women include the following key practices.  A routine yearly physical is a good way to check with your health care provider about your health and preventive screening. It is a chance to share any concerns and updates on your health and to receive a thorough exam.  Visit your dentist for a routine exam and preventive care every 6 months. Brush your teeth twice a day and floss once a day. Good oral hygiene prevents tooth decay and gum disease.  The frequency of eye exams is based on your age, health, family medical history, use of contact lenses, and other factors. Follow your health care provider's recommendations for frequency of eye exams.  Eat a healthy diet. Foods like vegetables, fruits, whole grains, low-fat dairy products, and lean protein foods contain the nutrients you need without too many calories. Decrease your intake of foods high in solid fats, added sugars, and salt. Eat the right amount of calories for you.Get information about a proper diet from your health care provider, if necessary.  Regular physical exercise is one of the most important things you can do for your health. Most adults should get at least 150 minutes of moderate-intensity exercise (any activity that increases your heart rate and causes you to sweat) each week. In addition, most adults need muscle-strengthening exercises on 2 or more days a week.  Maintain a healthy weight. The body mass index (BMI) is a screening tool to identify possible weight problems. It provides an estimate of body fat based on height and weight. Your health care provider can find your BMI and can help you achieve or maintain a healthy weight.For adults 20 years and older:  A BMI below 18.5 is considered underweight.  A BMI of 18.5 to 24.9 is normal.  A BMI of 25 to 29.9 is considered overweight.  A  BMI of 30 and above is considered obese.  Maintain normal blood lipids and cholesterol levels by exercising and minimizing your intake of saturated fat. Eat a balanced diet with plenty of fruit and vegetables. Blood tests for lipids and cholesterol should begin at age 45 and be repeated every 5 years. If your lipid or cholesterol levels are high, you are over 50, or you are at high risk for heart disease, you may need your cholesterol levels checked more frequently.Ongoing high lipid and cholesterol levels should be treated with medicines if diet and exercise are not working.  If you smoke, find out from your health care provider how to quit. If you do not use tobacco, do not start.  Lung cancer screening is recommended for adults aged 45-80 years who are at high risk for developing lung cancer because of a history of smoking. A yearly low-dose CT scan of the lungs is recommended for people who have at least a 30-pack-year history of smoking and are a current smoker or have quit within the past 15 years. A pack year of smoking is smoking an average of 1 pack of cigarettes a day for 1 year (for example: 1 pack a day for 30 years or 2 packs a day for 15 years). Yearly screening should continue until the smoker has stopped smoking for at least 15 years. Yearly screening should be stopped for people who develop a health problem that would prevent them from having lung cancer treatment.  If you are pregnant, do not drink alcohol. If you are  breastfeeding, be very cautious about drinking alcohol. If you are not pregnant and choose to drink alcohol, do not have more than 1 drink per day. One drink is considered to be 12 ounces (355 mL) of beer, 5 ounces (148 mL) of wine, or 1.5 ounces (44 mL) of liquor.  Avoid use of street drugs. Do not share needles with anyone. Ask for help if you need support or instructions about stopping the use of drugs.  High blood pressure causes heart disease and increases the risk  of stroke. Your blood pressure should be checked at least every 1 to 2 years. Ongoing high blood pressure should be treated with medicines if weight loss and exercise do not work.  If you are 55-79 years old, ask your health care provider if you should take aspirin to prevent strokes.  Diabetes screening is done by taking a blood sample to check your blood glucose level after you have not eaten for a certain period of time (fasting). If you are not overweight and you do not have risk factors for diabetes, you should be screened once every 3 years starting at age 45. If you are overweight or obese and you are 40-70 years of age, you should be screened for diabetes every year as part of your cardiovascular risk assessment.  Breast cancer screening is essential preventive care for women. You should practice "breast self-awareness." This means understanding the normal appearance and feel of your breasts and may include breast self-examination. Any changes detected, no matter how small, should be reported to a health care provider. Women in their 20s and 30s should have a clinical breast exam (CBE) by a health care provider as part of a regular health exam every 1 to 3 years. After age 40, women should have a CBE every year. Starting at age 40, women should consider having a mammogram (breast X-ray test) every year. Women who have a family history of breast cancer should talk to their health care provider about genetic screening. Women at a high risk of breast cancer should talk to their health care providers about having an MRI and a mammogram every year.  Breast cancer gene (BRCA)-related cancer risk assessment is recommended for women who have family members with BRCA-related cancers. BRCA-related cancers include breast, ovarian, tubal, and peritoneal cancers. Having family members with these cancers may be associated with an increased risk for harmful changes (mutations) in the breast cancer genes BRCA1 and  BRCA2. Results of the assessment will determine the need for genetic counseling and BRCA1 and BRCA2 testing.  Your health care provider may recommend that you be screened regularly for cancer of the pelvic organs (ovaries, uterus, and vagina). This screening involves a pelvic examination, including checking for microscopic changes to the surface of your cervix (Pap test). You may be encouraged to have this screening done every 3 years, beginning at age 21.  For women ages 30-65, health care providers may recommend pelvic exams and Pap testing every 3 years, or they may recommend the Pap and pelvic exam, combined with testing for human papilloma virus (HPV), every 5 years. Some types of HPV increase your risk of cervical cancer. Testing for HPV may also be done on women of any age with unclear Pap test results.  Other health care providers may not recommend any screening for nonpregnant women who are considered low risk for pelvic cancer and who do not have symptoms. Ask your health care provider if a screening pelvic exam is right for   you.  If you have had past treatment for cervical cancer or a condition that could lead to cancer, you need Pap tests and screening for cancer for at least 20 years after your treatment. If Pap tests have been discontinued, your risk factors (such as having a new sexual partner) need to be reassessed to determine if screening should resume. Some women have medical problems that increase the chance of getting cervical cancer. In these cases, your health care provider may recommend more frequent screening and Pap tests.  Colorectal cancer can be detected and often prevented. Most routine colorectal cancer screening begins at the age of 50 years and continues through age 75 years. However, your health care provider may recommend screening at an earlier age if you have risk factors for colon cancer. On a yearly basis, your health care provider may provide home test kits to check  for hidden blood in the stool. Use of a small camera at the end of a tube, to directly examine the colon (sigmoidoscopy or colonoscopy), can detect the earliest forms of colorectal cancer. Talk to your health care provider about this at age 50, when routine screening begins. Direct exam of the colon should be repeated every 5-10 years through age 75 years, unless early forms of precancerous polyps or small growths are found.  People who are at an increased risk for hepatitis B should be screened for this virus. You are considered at high risk for hepatitis B if:  You were born in a country where hepatitis B occurs often. Talk with your health care provider about which countries are considered high risk.  Your parents were born in a high-risk country and you have not received a shot to protect against hepatitis B (hepatitis B vaccine).  You have HIV or AIDS.  You use needles to inject street drugs.  You live with, or have sex with, someone who has hepatitis B.  You get hemodialysis treatment.  You take certain medicines for conditions like cancer, organ transplantation, and autoimmune conditions.  Hepatitis C blood testing is recommended for all people born from 1945 through 1965 and any individual with known risks for hepatitis C.  Practice safe sex. Use condoms and avoid high-risk sexual practices to reduce the spread of sexually transmitted infections (STIs). STIs include gonorrhea, chlamydia, syphilis, trichomonas, herpes, HPV, and human immunodeficiency virus (HIV). Herpes, HIV, and HPV are viral illnesses that have no cure. They can result in disability, cancer, and death.  You should be screened for sexually transmitted illnesses (STIs) including gonorrhea and chlamydia if:  You are sexually active and are younger than 24 years.  You are older than 24 years and your health care provider tells you that you are at risk for this type of infection.  Your sexual activity has changed  since you were last screened and you are at an increased risk for chlamydia or gonorrhea. Ask your health care provider if you are at risk.  If you are at risk of being infected with HIV, it is recommended that you take a prescription medicine daily to prevent HIV infection. This is called preexposure prophylaxis (PrEP). You are considered at risk if:  You are sexually active and do not regularly use condoms or know the HIV status of your partner(s).  You take drugs by injection.  You are sexually active with a partner who has HIV.  Talk with your health care provider about whether you are at high risk of being infected with HIV. If   you choose to begin PrEP, you should first be tested for HIV. You should then be tested every 3 months for as long as you are taking PrEP.  Osteoporosis is a disease in which the bones lose minerals and strength with aging. This can result in serious bone fractures or breaks. The risk of osteoporosis can be identified using a bone density scan. Women ages 67 years and over and women at risk for fractures or osteoporosis should discuss screening with their health care providers. Ask your health care provider whether you should take a calcium supplement or vitamin D to reduce the rate of osteoporosis.  Menopause can be associated with physical symptoms and risks. Hormone replacement therapy is available to decrease symptoms and risks. You should talk to your health care provider about whether hormone replacement therapy is right for you.  Use sunscreen. Apply sunscreen liberally and repeatedly throughout the day. You should seek shade when your shadow is shorter than you. Protect yourself by wearing long sleeves, pants, a wide-brimmed hat, and sunglasses year round, whenever you are outdoors.  Once a month, do a whole body skin exam, using a mirror to look at the skin on your back. Tell your health care provider of new moles, moles that have irregular borders, moles that  are larger than a pencil eraser, or moles that have changed in shape or color.  Stay current with required vaccines (immunizations).  Influenza vaccine. All adults should be immunized every year.  Tetanus, diphtheria, and acellular pertussis (Td, Tdap) vaccine. Pregnant women should receive 1 dose of Tdap vaccine during each pregnancy. The dose should be obtained regardless of the length of time since the last dose. Immunization is preferred during the 27th-36th week of gestation. An adult who has not previously received Tdap or who does not know her vaccine status should receive 1 dose of Tdap. This initial dose should be followed by tetanus and diphtheria toxoids (Td) booster doses every 10 years. Adults with an unknown or incomplete history of completing a 3-dose immunization series with Td-containing vaccines should begin or complete a primary immunization series including a Tdap dose. Adults should receive a Td booster every 10 years.  Varicella vaccine. An adult without evidence of immunity to varicella should receive 2 doses or a second dose if she has previously received 1 dose. Pregnant females who do not have evidence of immunity should receive the first dose after pregnancy. This first dose should be obtained before leaving the health care facility. The second dose should be obtained 4-8 weeks after the first dose.  Human papillomavirus (HPV) vaccine. Females aged 13-26 years who have not received the vaccine previously should obtain the 3-dose series. The vaccine is not recommended for use in pregnant females. However, pregnancy testing is not needed before receiving a dose. If a female is found to be pregnant after receiving a dose, no treatment is needed. In that case, the remaining doses should be delayed until after the pregnancy. Immunization is recommended for any person with an immunocompromised condition through the age of 61 years if she did not get any or all doses earlier. During the  3-dose series, the second dose should be obtained 4-8 weeks after the first dose. The third dose should be obtained 24 weeks after the first dose and 16 weeks after the second dose.  Zoster vaccine. One dose is recommended for adults aged 30 years or older unless certain conditions are present.  Measles, mumps, and rubella (MMR) vaccine. Adults born  before 1957 generally are considered immune to measles and mumps. Adults born in 1957 or later should have 1 or more doses of MMR vaccine unless there is a contraindication to the vaccine or there is laboratory evidence of immunity to each of the three diseases. A routine second dose of MMR vaccine should be obtained at least 28 days after the first dose for students attending postsecondary schools, health care workers, or international travelers. People who received inactivated measles vaccine or an unknown type of measles vaccine during 1963-1967 should receive 2 doses of MMR vaccine. People who received inactivated mumps vaccine or an unknown type of mumps vaccine before 1979 and are at high risk for mumps infection should consider immunization with 2 doses of MMR vaccine. For females of childbearing age, rubella immunity should be determined. If there is no evidence of immunity, females who are not pregnant should be vaccinated. If there is no evidence of immunity, females who are pregnant should delay immunization until after pregnancy. Unvaccinated health care workers born before 1957 who lack laboratory evidence of measles, mumps, or rubella immunity or laboratory confirmation of disease should consider measles and mumps immunization with 2 doses of MMR vaccine or rubella immunization with 1 dose of MMR vaccine.  Pneumococcal 13-valent conjugate (PCV13) vaccine. When indicated, a person who is uncertain of his immunization history and has no record of immunization should receive the PCV13 vaccine. All adults 65 years of age and older should receive this  vaccine. An adult aged 19 years or older who has certain medical conditions and has not been previously immunized should receive 1 dose of PCV13 vaccine. This PCV13 should be followed with a dose of pneumococcal polysaccharide (PPSV23) vaccine. Adults who are at high risk for pneumococcal disease should obtain the PPSV23 vaccine at least 8 weeks after the dose of PCV13 vaccine. Adults older than 58 years of age who have normal immune system function should obtain the PPSV23 vaccine dose at least 1 year after the dose of PCV13 vaccine.  Pneumococcal polysaccharide (PPSV23) vaccine. When PCV13 is also indicated, PCV13 should be obtained first. All adults aged 65 years and older should be immunized. An adult younger than age 65 years who has certain medical conditions should be immunized. Any person who resides in a nursing home or long-term care facility should be immunized. An adult smoker should be immunized. People with an immunocompromised condition and certain other conditions should receive both PCV13 and PPSV23 vaccines. People with human immunodeficiency virus (HIV) infection should be immunized as soon as possible after diagnosis. Immunization during chemotherapy or radiation therapy should be avoided. Routine use of PPSV23 vaccine is not recommended for American Indians, [place] Natives, or people younger than 65 years unless there are medical conditions that require PPSV23 vaccine. When indicated, people who have unknown immunization and have no record of immunization should receive PPSV23 vaccine. One-time revaccination 5 years after the first dose of PPSV23 is recommended for people aged 19-64 years who have chronic kidney failure, nephrotic syndrome, asplenia, or immunocompromised conditions. People who received 1-2 doses of PPSV23 before age 65 years should receive another dose of PPSV23 vaccine at age 65 years or later if at least 5 years have passed since the previous dose. Doses of PPSV23 are not  needed for people immunized with PPSV23 at or after age 65 years.  Meningococcal vaccine. Adults with asplenia or persistent complement component deficiencies should receive 2 doses of quadrivalent meningococcal conjugate (MenACWY-D) vaccine. The doses should be obtained   at least 2 months apart. Microbiologists working with certain meningococcal bacteria, [place] recruits, people at risk during an outbreak, and people who travel to or live in countries with a high rate of meningitis should be immunized. A first-year college student up through age 34 years who is living in a residence hall should receive a dose if she did not receive a dose on or after her 16th birthday. Adults who have certain high-risk conditions should receive one or more doses of vaccine.  Hepatitis A vaccine. Adults who wish to be protected from this disease, have certain high-risk conditions, work with hepatitis A-infected animals, work in hepatitis A research labs, or travel to or work in countries with a high rate of hepatitis A should be immunized. Adults who were previously unvaccinated and who anticipate close contact with an international adoptee during the first 60 days after arrival in the [place] from a country with a high rate of hepatitis A should be immunized.  Hepatitis B vaccine. Adults who wish to be protected from this disease, have certain high-risk conditions, may be exposed to blood or other infectious body fluids, are household contacts or sex partners of hepatitis B positive people, are clients or workers in certain care facilities, or travel to or work in countries with a high rate of hepatitis B should be immunized.  Haemophilus influenzae type b (Hib) vaccine. A previously unvaccinated person with asplenia or sickle cell disease or having a scheduled splenectomy should receive 1 dose of Hib vaccine. Regardless of previous immunization, a recipient of a hematopoietic stem cell transplant should receive a  3-dose series 6-12 months after her successful transplant. Hib vaccine is not recommended for adults with HIV infection. Preventive Services / Frequency Ages 35 to 4 years  Blood pressure check.** / Every 3-5 years.  Lipid and cholesterol check.** / Every 5 years beginning at age 60.  Clinical breast exam.** / Every 3 years for women in their 71s and 10s.  BRCA-related cancer risk assessment.** / For women who have family members with a BRCA-related cancer (breast, ovarian, tubal, or peritoneal cancers).  Pap test.** / Every 2 years from ages 76 through 26. Every 3 years starting at age 61 through age 76 or 93 with a history of 3 consecutive normal Pap tests.  HPV screening.** / Every 3 years from ages 37 through ages 60 to 51 with a history of 3 consecutive normal Pap tests.  Hepatitis C blood test.** / For any individual with known risks for hepatitis C.  Skin self-exam. / Monthly.  Influenza vaccine. / Every year.  Tetanus, diphtheria, and acellular pertussis (Tdap, Td) vaccine.** / Consult your health care provider. Pregnant women should receive 1 dose of Tdap vaccine during each pregnancy. 1 dose of Td every 10 years.  Varicella vaccine.** / Consult your health care provider. Pregnant females who do not have evidence of immunity should receive the first dose after pregnancy.  HPV vaccine. / 3 doses over 6 months, if 93 and younger. The vaccine is not recommended for use in pregnant females. However, pregnancy testing is not needed before receiving a dose.  Measles, mumps, rubella (MMR) vaccine.** / You need at least 1 dose of MMR if you were born in 1957 or later. You may also need a 2nd dose. For females of childbearing age, rubella immunity should be determined. If there is no evidence of immunity, females who are not pregnant should be vaccinated. If there is no evidence of immunity, females who are  pregnant should delay immunization until after pregnancy.  Pneumococcal  13-valent conjugate (PCV13) vaccine.** / Consult your health care provider.  Pneumococcal polysaccharide (PPSV23) vaccine.** / 1 to 2 doses if you smoke cigarettes or if you have certain conditions.  Meningococcal vaccine.** / 1 dose if you are age 68 to 8 years and a Market researcher living in a residence hall, or have one of several medical conditions, you need to get vaccinated against meningococcal disease. You may also need additional booster doses.  Hepatitis A vaccine.** / Consult your health care provider.  Hepatitis B vaccine.** / Consult your health care provider.  Haemophilus influenzae type b (Hib) vaccine.** / Consult your health care provider. Ages 7 to 53 years  Blood pressure check.** / Every year.  Lipid and cholesterol check.** / Every 5 years beginning at age 25 years.  Lung cancer screening. / Every year if you are aged 11-80 years and have a 30-pack-year history of smoking and currently smoke or have quit within the past 15 years. Yearly screening is stopped once you have quit smoking for at least 15 years or develop a health problem that would prevent you from having lung cancer treatment.  Clinical breast exam.** / Every year after age 48 years.  BRCA-related cancer risk assessment.** / For women who have family members with a BRCA-related cancer (breast, ovarian, tubal, or peritoneal cancers).  Mammogram.** / Every year beginning at age 41 years and continuing for as long as you are in good health. Consult with your health care provider.  Pap test.** / Every 3 years starting at age 65 years through age 37 or 70 years with a history of 3 consecutive normal Pap tests.  HPV screening.** / Every 3 years from ages 72 years through ages 60 to 40 years with a history of 3 consecutive normal Pap tests.  Fecal occult blood test (FOBT) of stool. / Every year beginning at age 21 years and continuing until age 5 years. You may not need to do this test if you get  a colonoscopy every 10 years.  Flexible sigmoidoscopy or colonoscopy.** / Every 5 years for a flexible sigmoidoscopy or every 10 years for a colonoscopy beginning at age 35 years and continuing until age 48 years.  Hepatitis C blood test.** / For all people born from [birth year] through 1965 and any individual with known risks for hepatitis C.  Skin self-exam. / Monthly.  Influenza vaccine. / Every year.  Tetanus, diphtheria, and acellular pertussis (Tdap/Td) vaccine.** / Consult your health care provider. Pregnant women should receive 1 dose of Tdap vaccine during each pregnancy. 1 dose of Td every 10 years.  Varicella vaccine.** / Consult your health care provider. Pregnant females who do not have evidence of immunity should receive the first dose after pregnancy.  Zoster vaccine.** / 1 dose for adults aged 30 years or older.  Measles, mumps, rubella (MMR) vaccine.** / You need at least 1 dose of MMR if you were born in 1957 or later. You may also need a second dose. For females of childbearing age, rubella immunity should be determined. If there is no evidence of immunity, females who are not pregnant should be vaccinated. If there is no evidence of immunity, females who are pregnant should delay immunization until after pregnancy.  Pneumococcal 13-valent conjugate (PCV13) vaccine.** / Consult your health care provider.  Pneumococcal polysaccharide (PPSV23) vaccine.** / 1 to 2 doses if you smoke cigarettes or if you have certain conditions.  Meningococcal vaccine.** /  Consult your health care provider.  Hepatitis A vaccine.** / Consult your health care provider.  Hepatitis B vaccine.** / Consult your health care provider.  Haemophilus influenzae type b (Hib) vaccine.** / Consult your health care provider. Ages 64 years and over  Blood pressure check.** / Every year.  Lipid and cholesterol check.** / Every 5 years beginning at age 23 years.  Lung cancer screening. / Every year if you  are aged 16-80 years and have a 30-pack-year history of smoking and currently smoke or have quit within the past 15 years. Yearly screening is stopped once you have quit smoking for at least 15 years or develop a health problem that would prevent you from having lung cancer treatment.  Clinical breast exam.** / Every year after age 74 years.  BRCA-related cancer risk assessment.** / For women who have family members with a BRCA-related cancer (breast, ovarian, tubal, or peritoneal cancers).  Mammogram.** / Every year beginning at age 44 years and continuing for as long as you are in good health. Consult with your health care provider.  Pap test.** / Every 3 years starting at age 58 years through age 22 or 39 years with 3 consecutive normal Pap tests. Testing can be stopped between 65 and 70 years with 3 consecutive normal Pap tests and no abnormal Pap or HPV tests in the past 10 years.  HPV screening.** / Every 3 years from ages 64 years through ages 70 or 61 years with a history of 3 consecutive normal Pap tests. Testing can be stopped between 65 and 70 years with 3 consecutive normal Pap tests and no abnormal Pap or HPV tests in the past 10 years.  Fecal occult blood test (FOBT) of stool. / Every year beginning at age 40 years and continuing until age 27 years. You may not need to do this test if you get a colonoscopy every 10 years.  Flexible sigmoidoscopy or colonoscopy.** / Every 5 years for a flexible sigmoidoscopy or every 10 years for a colonoscopy beginning at age 7 years and continuing until age 32 years.  Hepatitis C blood test.** / For all people born from [birth year] through 1963 and any individual with known risks for hepatitis C.  Osteoporosis screening.** / A one-time screening for women ages 30 years and over and women at risk for fractures or osteoporosis.  Skin self-exam. / Monthly.  Influenza vaccine. / Every year.  Tetanus, diphtheria, and acellular pertussis (Tdap/Td)  vaccine.** / 1 dose of Td every 10 years.  Varicella vaccine.** / Consult your health care provider.  Zoster vaccine.** / 1 dose for adults aged 35 years or older.  Pneumococcal 13-valent conjugate (PCV13) vaccine.** / Consult your health care provider.  Pneumococcal polysaccharide (PPSV23) vaccine.** / 1 dose for all adults aged 46 years and older.  Meningococcal vaccine.** / Consult your health care provider.  Hepatitis A vaccine.** / Consult your health care provider.  Hepatitis B vaccine.** / Consult your health care provider.  Haemophilus influenzae type b (Hib) vaccine.** / Consult your health care provider. ** Family history and personal history of risk and conditions may change your health care provider's recommendations.   This information is not intended to replace advice given to you by your health care provider. Make sure you discuss any questions you have with your health care provider.   Document Released: 09/29/2001 Document Revised: 08/24/2014 Document Reviewed: 12/29/2010 Elsevier Interactive Patient Education [employer].

## 2016-03-05 NOTE — Progress Notes (Signed)
Pre visit review using our clinic review tool, if applicable. No additional management support is needed unless otherwise documented below in the visit note. 

## 2016-03-05 NOTE — Progress Notes (Signed)
Subjective:     Kayla Banks is a 60 y.o. female and is here for a comprehensive physical exam. The patient reports no problems.  Social History   Social History  . Marital Status: Single    Spouse Name: N/A  . Number of Children: N/A  . Years of Education: N/A   Occupational History  . [employer]    Social History Main Topics  . Smoking status: Never Smoker   . Smokeless tobacco: Never Used  . Alcohol Use: No  . Drug Use: No  . Sexual Activity:    Partners: Male   Other Topics Concern  . Not on file   Social History Narrative   Exercise-- no   Health Maintenance  Topic Date Due  . Hepatitis C Screening  03-31-1956  . HIV Screening  01/31/1973  . INFLUENZA VACCINE  11/03/2016 (Originally 03/17/2016)  . MAMMOGRAM  11/28/2017  . PAP SMEAR  02/07/2018  . COLONOSCOPY  09/18/2018  . TETANUS/TDAP  11/16/2019    The following portions of the patient's history were reviewed and updated as appropriate:  She  has a past medical history of Thyroid disease; Hypothyroidism; Heart murmur; and Hypertension. She  does not have any pertinent problems on file. She  has past surgical history that includes Lumbar laminectomy; Foot surgery; and Abdominal hysterectomy (02/2012). Her family history includes Breast cancer (age of onset: 64) in her maternal aunt; Cancer (age of onset: 37) in her maternal aunt; Diabetes in her mother; Heart disease (age of onset: 29) in her father; Hyperlipidemia in her mother; Hypertension in her father and mother; Hyperthyroidism in her sister; Hypothyroidism in her sister. She  reports that she has never smoked. She has never used smokeless tobacco. She reports that she does not drink alcohol or use illicit drugs. She has a current medication list which includes the following prescription(s): carvedilol, furosemide, meloxicam, synthroid, valacyclovir, and epinephrine. Current Outpatient Prescriptions on File Prior to Visit  Medication Sig  Dispense Refill  . EPINEPHrine (EPIPEN 2-PAK) 0.3 mg/0.3 mL IJ SOAJ injection Inject 0.3 mLs (0.3 mg total) into the muscle as needed. For allergic reaction (Patient not taking: Reported on 03/05/2016) 1 Device 1   No current facility-administered medications on file prior to visit.   She is allergic to erythromycin; lisinopril; and penicillins..  Review of Systems Review of Systems  Constitutional: Negative for activity change, appetite change and fatigue.  HENT: Negative for hearing loss, congestion, tinnitus and ear discharge.  dentist q25m Eyes: Negative for visual disturbance (see optho q1y -- vision corrected to 20/20 with glasses).  Respiratory: Negative for cough, chest tightness and shortness of breath.   Cardiovascular: Negative for chest pain, palpitations and leg swelling.  Gastrointestinal: Negative for abdominal pain, diarrhea, constipation and abdominal distention.  Genitourinary: Negative for urgency, frequency, decreased urine volume and difficulty urinating.  Musculoskeletal: Negative for back pain, arthralgias and gait problem.  Skin: Negative for color change, pallor and rash.  Neurological: Negative for dizziness, light-headedness, numbness and headaches.  Hematological: Negative for adenopathy. Does not bruise/bleed easily.  Psychiatric/Behavioral: Negative for suicidal ideas, confusion, sleep disturbance, self-injury, dysphoric mood, decreased concentration and agitation.      Objective:    BP 128/82 mmHg  Pulse 70  Temp(Src) 98.2 F (36.8 C) (Oral)  Ht 5\' 5"  (1.651 m)  Wt 273 lb 2 oz (123.889 kg)  BMI 45.45 kg/m2  SpO2 96%  LMP 01/28/2012 General appearance: alert, cooperative, appears stated age and no  distress Head: Normocephalic, without obvious abnormality, atraumatic Eyes: conjunctivae/corneas clear. PERRL, EOM's intact. Fundi benign. Ears: normal TM's and external ear canals both ears Nose: Nares normal. Septum midline. Mucosa normal. No drainage or  sinus tenderness. Throat: lips, mucosa, and tongue normal; teeth and gums normal Neck: no adenopathy, no carotid bruit, no JVD, supple, symmetrical, trachea midline and thyroid not enlarged, symmetric, no tenderness/mass/nodules Back: symmetric, no curvature. ROM normal. No CVA tenderness. Lungs: clear to auscultation bilaterally Breasts: normal appearance, no masses or tenderness Heart: regular rate and rhythm, S1, S2 normal, no murmur, click, rub or gallop Abdomen: soft, non-tender; bowel sounds normal; no masses,  no organomegaly Pelvic: deferred and not indicated; status post hysterectomy, negative ROS Extremities: extremities normal, atraumatic, no cyanosis or edema Pulses: 2+ and symmetric Skin: Skin color, texture, turgor normal. No rashes or lesions Lymph nodes: Cervical, supraclavicular, and axillary nodes normal. Neurologic: Alert and oriented X 3, normal strength and tone. Normal symmetric reflexes. Normal coordination and gait    Assessment:    Healthy female exam.      Plan:    check labs ghm utd See After Visit Summary for Counseling Recommendations    1. Preventative health care See above - POCT urinalysis dipstick - TSH - Lipid panel - CBC with Differential/Platelet - Comprehensive metabolic panel  2. Hypothyroidism, unspecified hypothyroidism type Check labs - TSH - SYNTHROID 100 MCG tablet; TAKE 1 TABLET BY MOUTH DAILY BEFORE BREAKFAST  Dispense: 90 tablet; Refill: 1  3. Essential hypertension stable - POCT urinalysis dipstick - CBC with Differential/Platelet - Comprehensive metabolic panel - carvedilol (COREG) 25 MG tablet; TAKE 1 TABLET BY MOUTH TWICE DAILY WITH MEAL,  Dispense: 180 tablet; Refill: 1 - furosemide (LASIX) 20 MG tablet; Take 1 tablet (20 mg total) by mouth daily.  Dispense: 90 tablet; Refill: 1  4. Hyperlipidemia Check lab - Lipid panel  5. H/O cold sores  - valACYclovir (VALTREX) 1000 MG tablet; TAKE 1 TABLET BY MOUTH THREE TIMES  DAILY, DISCONTINUE ALL PREVIOUS REFILLS FOR THIS MEDICATION  Dispense: 30 tablet; Refill: 5  6. Primary osteoarthritis of both knees  - meloxicam (MOBIC) 15 MG tablet; TAKE 1 TABLET(15 MG) BY MOUTH DAILY AS NEEDED FOR PAIN  Dispense: 90 tablet; Refill: 1

## 2016-03-06 LAB — URINE CYTOLOGY ANCILLARY ONLY
Chlamydia: NEGATIVE
NEISSERIA GONORRHEA: NEGATIVE
TRICH (WINDOWPATH): NEGATIVE

## 2016-03-06 LAB — HIV ANTIBODY (ROUTINE TESTING W REFLEX): HIV 1&2 Ab, 4th Generation: NONREACTIVE

## 2016-03-06 LAB — HEPATITIS C ANTIBODY: HCV AB: NEGATIVE

## 2016-03-09 LAB — URINE CYTOLOGY ANCILLARY ONLY
Bacterial vaginitis: NEGATIVE
Candida vaginitis: NEGATIVE

## 2016-04-14 ENCOUNTER — Ambulatory Visit: Payer: Self-pay | Admitting: Family Medicine

## 2016-06-09 ENCOUNTER — Telehealth: Payer: Self-pay | Admitting: Family Medicine

## 2016-06-09 NOTE — Telephone Encounter (Signed)
Good Afternoon; I would like to request an appointment to see Dr. Etter Sjogren to discuss possible weight management options in more detail and to determine whether any type of medical intervention is needed/suggested at this point. I have not felt my best over the past several days (i.e., sluggish, shoulder/upper chest muscle pain/strain, etc.) which I think is primarily due to the weight, so I am seeking Dr. Nonda Lou input/direction. Also, in a recent appointment with my orthopaedic doctor, Dr. Dorna Leitz at [facility], RE: knee pain, he prescribed cortisone shots, but indicated that future knee surgery was likely. He has concerns RE: my weight and the possibility of that adversely affecting the post-operative healing. He suggested that I speak with Dr. Etter Sjogren RE: options.    Patient wrote this message to schedule through [facility]. Patient scheduled for 06/16/16

## 2016-06-09 NOTE — Telephone Encounter (Signed)
It may be a good idea for pt to check with her ins company to see if they will pay for any weight loss meds--- before she comes in

## 2016-06-10 NOTE — Telephone Encounter (Signed)
Detailed message left advising to call the insurance company to see what's covered prior to appointment.    KP

## 2016-06-16 ENCOUNTER — Ambulatory Visit: Payer: BC Managed Care – PPO | Admitting: Family Medicine

## 2016-07-03 ENCOUNTER — Other Ambulatory Visit: Payer: Self-pay | Admitting: Family Medicine

## 2016-07-03 DIAGNOSIS — I10 Essential (primary) hypertension: Secondary | ICD-10-CM

## 2016-07-30 ENCOUNTER — Ambulatory Visit (INDEPENDENT_AMBULATORY_CARE_PROVIDER_SITE_OTHER): Payer: BC Managed Care – PPO | Admitting: Family Medicine

## 2016-07-30 ENCOUNTER — Encounter: Payer: Self-pay | Admitting: Family Medicine

## 2016-07-30 VITALS — BP 158/80 | HR 68 | Temp 97.9°F | Resp 16 | Ht 65.0 in | Wt 276.6 lb

## 2016-07-30 DIAGNOSIS — J209 Acute bronchitis, unspecified: Secondary | ICD-10-CM

## 2016-07-30 MED ORDER — DOXYCYCLINE HYCLATE 100 MG PO TABS: 100.0000 mg | ORAL_TABLET | Freq: Two times a day (BID) | ORAL | 0 refills | Status: DC

## 2016-07-30 MED ORDER — FLUTICASONE PROPIONATE 50 MCG/ACT NA SUSP: 2.0000 | Freq: Every day | NASAL | 6 refills | Status: DC

## 2016-07-30 NOTE — Progress Notes (Signed)
Subjective:     Kayla Banks is a 60 y.o. female who presents for evaluation of sinus pain. Symptoms include: congestion, cough, facial pain, headaches, nasal congestion, post nasal drip, purulent rhinorrhea, sinus pressure and sore throat. Onset of symptoms was 1 week ago. Symptoms have been gradually worsening since that time. Past history is significant for no history of pneumonia or bronchitis. Patient is a non-smoker. Tried sudafed and other otc with no relief.    The following portions of the patient's history were reviewed and updated as appropriate: She  has a past medical history of Heart murmur; Hypertension; Hypothyroidism; and Thyroid disease. She  does not have any pertinent problems on file. She  has a past surgical history that includes Lumbar laminectomy; Foot surgery; and Abdominal hysterectomy (02/2012). Her family history includes Breast cancer (age of onset: 72) in her maternal aunt; Cancer (age of onset: 52) in her maternal aunt; Diabetes in her mother; Heart disease (age of onset: 24) in her father; Hyperlipidemia in her mother; Hypertension in her father and mother; Hyperthyroidism in her sister; Hypothyroidism in her sister. She  reports that she has never smoked. She has never used smokeless tobacco. She reports that she does not drink alcohol or use drugs. She has a current medication list which includes the following prescription(s): carvedilol, carvedilol, epinephrine, furosemide, meloxicam, synthroid, and valacyclovir. Current Outpatient Prescriptions on File Prior to Visit  Medication Sig Dispense Refill  . carvedilol (COREG) 25 MG tablet TAKE 1 TABLET BY MOUTH TWICE DAILY WITH MEAL, 180 tablet 1  . carvedilol (COREG) 25 MG tablet TAKE 1 TABLET BY MOUTH TWICE DAILY WITH MEAL 180 tablet 0  . EPINEPHrine (EPIPEN 2-PAK) 0.3 mg/0.3 mL IJ SOAJ injection Inject 0.3 mLs (0.3 mg total) into the muscle as needed. For allergic reaction (Patient not taking: Reported on 03/05/2016)  1 Device 1  . furosemide (LASIX) 20 MG tablet Take 1 tablet (20 mg total) by mouth daily. 90 tablet 1  . meloxicam (MOBIC) 15 MG tablet TAKE 1 TABLET(15 MG) BY MOUTH DAILY AS NEEDED FOR PAIN 90 tablet 1  . SYNTHROID 100 MCG tablet TAKE 1 TABLET BY MOUTH DAILY BEFORE BREAKFAST 90 tablet 1  . valACYclovir (VALTREX) 1000 MG tablet TAKE 1 TABLET BY MOUTH THREE TIMES DAILY, DISCONTINUE ALL PREVIOUS REFILLS FOR THIS MEDICATION 30 tablet 5   No current facility-administered medications on file prior to visit.    She is allergic to erythromycin; lisinopril; and penicillins..  Review of Systems Pertinent items are noted in HPI.   Objective:    BP (!) 158/80 (BP Location: Left Arm, Patient Position: Sitting, Cuff Size: Large)   Pulse 68   Temp 97.9 F (36.6 C) (Oral)   Resp 16   Ht 5\' 5"  (1.651 m)   Wt 276 lb 9.6 oz (125.5 kg)   LMP 01/28/2012   SpO2 98%   BMI 46.03 kg/m  General appearance: alert, cooperative, appears stated age and mild distress Ears: normal TM's and external ear canals both ears Nose: yellow discharge, moderate congestion, turbinates red, swollen, sinus tenderness bilateral Throat: abnormal findings: mild oropharyngeal erythema Neck: mild anterior cervical adenopathy, supple, symmetrical, trachea midline and thyroid not enlarged, symmetric, no tenderness/mass/nodules Lungs: clear to auscultation bilaterally Heart: S1, S2 normal    Assessment:    Acute bacterial sinusitis.    Plan:    Nasal steroids per medication orders. Antihistamines per medication orders. doxycyline  per medication orders. see orders-- con'[t otc cough med

## 2016-07-30 NOTE — Patient Instructions (Signed)

## 2016-09-25 ENCOUNTER — Ambulatory Visit: Payer: Self-pay | Admitting: Internal Medicine

## 2016-10-21 ENCOUNTER — Other Ambulatory Visit: Payer: Self-pay | Admitting: Family Medicine

## 2016-10-21 DIAGNOSIS — M170 Bilateral primary osteoarthritis of knee: Secondary | ICD-10-CM

## 2016-10-21 DIAGNOSIS — I10 Essential (primary) hypertension: Secondary | ICD-10-CM

## 2017-01-14 ENCOUNTER — Other Ambulatory Visit: Payer: Self-pay | Admitting: Family Medicine

## 2017-01-14 DIAGNOSIS — I10 Essential (primary) hypertension: Secondary | ICD-10-CM

## 2017-01-14 DIAGNOSIS — E039 Hypothyroidism, unspecified: Secondary | ICD-10-CM

## 2017-01-14 DIAGNOSIS — M170 Bilateral primary osteoarthritis of knee: Secondary | ICD-10-CM

## 2017-01-14 DIAGNOSIS — Z889 Allergy status to unspecified drugs, medicaments and biological substances status: Secondary | ICD-10-CM

## 2017-02-12 ENCOUNTER — Other Ambulatory Visit: Payer: Self-pay | Admitting: Family Medicine

## 2017-02-12 DIAGNOSIS — E039 Hypothyroidism, unspecified: Secondary | ICD-10-CM

## 2017-03-16 ENCOUNTER — Other Ambulatory Visit: Payer: Self-pay | Admitting: Family Medicine

## 2017-03-16 DIAGNOSIS — E039 Hypothyroidism, unspecified: Secondary | ICD-10-CM

## 2017-04-12 ENCOUNTER — Other Ambulatory Visit: Payer: Self-pay | Admitting: Family Medicine

## 2017-04-12 DIAGNOSIS — M170 Bilateral primary osteoarthritis of knee: Secondary | ICD-10-CM

## 2017-04-12 DIAGNOSIS — I10 Essential (primary) hypertension: Secondary | ICD-10-CM

## 2017-04-16 ENCOUNTER — Encounter: Payer: Self-pay | Admitting: Family Medicine

## 2017-04-16 ENCOUNTER — Telehealth: Payer: Self-pay | Admitting: Family Medicine

## 2017-04-16 NOTE — Telephone Encounter (Signed)
No charge. 

## 2017-04-16 NOTE — Telephone Encounter (Addendum)
Patient canceled her 3pm physical appointment for today due to work related concerns, patient [facility] to 07/15/17, charge or no charge

## 2017-04-27 ENCOUNTER — Other Ambulatory Visit: Payer: Self-pay | Admitting: Family Medicine

## 2017-04-27 DIAGNOSIS — E039 Hypothyroidism, unspecified: Secondary | ICD-10-CM

## 2017-04-27 NOTE — Telephone Encounter (Signed)
TB-Could you please reach out to this patient? She is overdue appt and lab for her Synthroid/I know she is scheduled for November but she needs to be seen within 30d with labs/plz advise/thx dmf

## 2017-04-28 NOTE — Telephone Encounter (Signed)
lvm advising patient to schedule appointment °

## 2017-06-14 ENCOUNTER — Other Ambulatory Visit: Payer: Self-pay | Admitting: Family Medicine

## 2017-06-14 DIAGNOSIS — R921 Mammographic calcification found on diagnostic imaging of breast: Secondary | ICD-10-CM

## 2017-06-15 ENCOUNTER — Telehealth: Payer: Self-pay | Admitting: *Deleted

## 2017-06-15 NOTE — Telephone Encounter (Signed)
Received Physician Orders from [facility]; forwarded to provider/SLS 10/30

## 2017-06-21 ENCOUNTER — Ambulatory Visit: Payer: BC Managed Care – PPO | Admitting: Family Medicine

## 2017-06-21 ENCOUNTER — Encounter: Payer: Self-pay | Admitting: Family Medicine

## 2017-06-21 VITALS — BP 118/82 | HR 82 | Temp 98.0°F | Ht 66.0 in | Wt 250.0 lb

## 2017-06-21 DIAGNOSIS — I10 Essential (primary) hypertension: Secondary | ICD-10-CM | POA: Diagnosis not present

## 2017-06-21 DIAGNOSIS — Z23 Encounter for immunization: Secondary | ICD-10-CM

## 2017-06-21 DIAGNOSIS — R5383 Other fatigue: Secondary | ICD-10-CM

## 2017-06-21 DIAGNOSIS — M170 Bilateral primary osteoarthritis of knee: Secondary | ICD-10-CM | POA: Diagnosis not present

## 2017-06-21 DIAGNOSIS — Z8619 Personal history of other infectious and parasitic diseases: Secondary | ICD-10-CM | POA: Diagnosis not present

## 2017-06-21 DIAGNOSIS — R079 Chest pain, unspecified: Secondary | ICD-10-CM

## 2017-06-21 DIAGNOSIS — E039 Hypothyroidism, unspecified: Secondary | ICD-10-CM | POA: Diagnosis not present

## 2017-06-21 LAB — CBC WITH DIFFERENTIAL/PLATELET
Basophils Absolute: 0 10*3/uL (ref 0.0–0.1)
Basophils Relative: 0.4 % (ref 0.0–3.0)
EOS PCT: 1.8 % (ref 0.0–5.0)
Eosinophils Absolute: 0.1 10*3/uL (ref 0.0–0.7)
HCT: 42 % (ref 36.0–46.0)
Hemoglobin: 13.5 g/dL (ref 12.0–15.0)
LYMPHS PCT: 23.1 % (ref 12.0–46.0)
Lymphs Abs: 1.9 10*3/uL (ref 0.7–4.0)
MCHC: 32.1 g/dL (ref 30.0–36.0)
MCV: 85.7 fl (ref 78.0–100.0)
MONOS PCT: 4.8 % (ref 3.0–12.0)
Monocytes Absolute: 0.4 10*3/uL (ref 0.1–1.0)
NEUTROS ABS: 5.7 10*3/uL (ref 1.4–7.7)
NEUTROS PCT: 69.9 % (ref 43.0–77.0)
Platelets: 268 10*3/uL (ref 150.0–400.0)
RBC: 4.9 Mil/uL (ref 3.87–5.11)
RDW: 15.5 % (ref 11.5–15.5)
WBC: 8.2 10*3/uL (ref 4.0–10.5)

## 2017-06-21 LAB — COMPREHENSIVE METABOLIC PANEL
ALK PHOS: 72 U/L (ref 39–117)
ALT: 11 U/L (ref 0–35)
AST: 14 U/L (ref 0–37)
Albumin: 4 g/dL (ref 3.5–5.2)
BILIRUBIN TOTAL: 0.9 mg/dL (ref 0.2–1.2)
BUN: 15 mg/dL (ref 6–23)
CALCIUM: 9.8 mg/dL (ref 8.4–10.5)
CO2: 32 meq/L (ref 19–32)
Chloride: 104 mEq/L (ref 96–112)
Creatinine, Ser: 0.71 mg/dL (ref 0.40–1.20)
GFR: 108.22 mL/min (ref >60.00)
GLUCOSE: 90 mg/dL (ref 70–99)
POTASSIUM: 5 meq/L (ref 3.5–5.1)
Sodium: 143 mEq/L (ref 135–145)
Total Protein: 7.4 g/dL (ref 6.0–8.3)

## 2017-06-21 LAB — LIPID PANEL
CHOL/HDL RATIO: 3
CHOLESTEROL: 173 mg/dL (ref 0–200)
HDL: 61.6 mg/dL (ref >39.00)
LDL Cholesterol: 104 mg/dL — ABNORMAL HIGH (ref 0–99)
NonHDL: 111.1
TRIGLYCERIDES: 38 mg/dL (ref 0.0–149.0)
VLDL: 7.6 mg/dL (ref 0.0–40.0)

## 2017-06-21 LAB — T3, FREE: T3, Free: 3.4 pg/mL (ref 2.3–4.2)

## 2017-06-21 LAB — T4, FREE: Free T4: 0.87 ng/dL (ref 0.60–1.60)

## 2017-06-21 LAB — TSH: TSH: 2.03 u[IU]/mL (ref 0.35–4.50)

## 2017-06-21 MED ORDER — VALACYCLOVIR HCL 1 G PO TABS
ORAL_TABLET | ORAL | 5 refills | Status: DC
Start: 2017-06-21 — End: 2018-08-04

## 2017-06-21 MED ORDER — MELOXICAM 15 MG PO TABS: 15.0000 mg | ORAL_TABLET | Freq: Every day | ORAL | 0 refills | Status: DC | PRN

## 2017-06-21 MED ORDER — SYNTHROID 100 MCG PO TABS: ORAL_TABLET | ORAL | 1 refills | Status: DC

## 2017-06-21 MED ORDER — CARVEDILOL 25 MG PO TABS: 25.0000 mg | ORAL_TABLET | Freq: Two times a day (BID) | ORAL | 1 refills | Status: DC

## 2017-06-21 MED ORDER — FUROSEMIDE 20 MG PO TABS: 20.0000 mg | ORAL_TABLET | Freq: Every day | ORAL | 1 refills | Status: DC

## 2017-06-21 NOTE — Progress Notes (Signed)
Patient ID: Kayla Banks, female    DOB: [birth year]  Age: 61 y.o. MRN: [id number]    Subjective:  Subjective  HPI Kayla Banks presents for f/u thyroid.  She has been stretching out her last rx of synthroid and c/o fatigue and chest pains.  Last episode cp was last week.  No sob, no palpitations.    Review of Systems  Constitutional: Positive for fatigue. Negative for appetite change, diaphoresis and unexpected weight change.  Eyes: Negative for pain, redness and visual disturbance.  Respiratory: Negative for cough, chest tightness, shortness of breath and wheezing.   Cardiovascular: Positive for chest pain. Negative for palpitations and leg swelling.  Endocrine: Negative for cold intolerance, heat intolerance, polydipsia, polyphagia and polyuria.  Genitourinary: Negative for difficulty urinating, dysuria and frequency.  Neurological: Negative for dizziness, light-headedness, numbness and headaches.    History Past Medical History:  Diagnosis Date  . Heart murmur   . Hypertension    on meds  . Hypothyroidism   . Thyroid disease    Hypothyroidism    She has a past surgical history that includes Lumbar laminectomy; Foot surgery; Abdominal hysterectomy (02/2012); ROBOTIC ASSISTED TOTAL HYSTERECTOMY (N/A, 02/10/2012); and ROBOTIC ASSISTED BILATERAL SALPINGO OOPHORECTOMY (Bilateral, 02/10/2012).   Her family history includes Breast cancer (age of onset: 50) in her maternal aunt; Cancer (age of onset: 105) in her maternal aunt; Diabetes in her mother; Heart disease (age of onset: 22) in her father; Hyperlipidemia in her mother; Hypertension in her father and mother; Hyperthyroidism in her sister; Hypothyroidism in her sister.She reports that  has never smoked. she has never used smokeless tobacco. She reports that she does not drink alcohol or use drugs.  Current Outpatient Medications on File Prior to Visit  Medication Sig Dispense Refill  . EPINEPHRINE 0.3 mg/0.3 mL IJ SOAJ  injection INJECT 0.3 ML(1 SYRINGE) IN THE MUSCLE AS NEEDED FOR ALLERGIC REACTION 2 Device 0   No current facility-administered medications on file prior to visit.      Objective:  Objective  Physical Exam  Constitutional: She is oriented to person, place, and time. She appears well-developed and well-nourished.  HENT:  Head: Normocephalic and atraumatic.  Eyes: Conjunctivae and EOM are normal.  Neck: Normal range of motion. Neck supple. No JVD present. Carotid bruit is not present. No thyromegaly present.  Cardiovascular: Normal rate and regular rhythm.  Murmur heard. Pulmonary/Chest: Effort normal and breath sounds normal. No respiratory distress. She has no wheezes. She has no rales. She exhibits no tenderness.  Musculoskeletal: She exhibits no edema.  Neurological: She is alert and oriented to person, place, and time.  Psychiatric: She has a normal mood and affect.  Nursing note and vitals reviewed.  BP 118/82   Pulse 82   Temp 98 F (36.7 C)   Ht 5\' 6"  (1.676 m)   Wt 250 lb (113.4 kg)   LMP 01/28/2012   SpO2 98%   BMI 40.35 kg/m  Wt Readings from Last 3 Encounters:  06/21/17 250 lb (113.4 kg)  07/30/16 276 lb 9.6 oz (125.5 kg)  03/05/16 273 lb 2 oz (123.9 kg)     Lab Results  Component Value Date   WBC 8.2 06/21/2017   HGB 13.5 06/21/2017   HCT 42.0 06/21/2017   PLT 268.0 06/21/2017   GLUCOSE 90 06/21/2017   CHOL 173 06/21/2017   TRIG 38.0 06/21/2017   HDL 61.60 06/21/2017   LDLCALC 104 (H) 06/21/2017   ALT 11 06/21/2017   AST 14 06/21/2017  NA 143 06/21/2017   K 5.0 06/21/2017   CL 104 06/21/2017   CREATININE 0.71 06/21/2017   BUN 15 06/21/2017   CO2 32 06/21/2017   TSH 2.03 06/21/2017   HGBA1C 6.0 11/04/2015    No results found.   Assessment & Plan:  Plan  I have discontinued Nashiya Mclelland's doxycycline and fluticasone. I have also changed her carvedilol, furosemide, and meloxicam. Additionally, I am having her maintain her EPINEPHrine,  SYNTHROID, and valACYclovir.  Meds ordered this encounter  Medications  . carvedilol (COREG) 25 MG tablet    Sig: Take 1 tablet (25 mg total) 2 (two) times daily with a meal by mouth.    Dispense:  180 tablet    Refill:  1  . furosemide (LASIX) 20 MG tablet    Sig: Take 1 tablet (20 mg total) daily by mouth.    Dispense:  90 tablet    Refill:  1  . SYNTHROID 100 MCG tablet    Sig: TAKE 1 TABLET BY MOUTH DAILY BEFORE BREAKFAST    Dispense:  90 tablet    Refill:  1    Patient needs labs and appt before next refill  . meloxicam (MOBIC) 15 MG tablet    Sig: Take 1 tablet (15 mg total) daily as needed by mouth for pain.    Dispense:  90 tablet    Refill:  0  . valACYclovir (VALTREX) 1000 MG tablet    Sig: TAKE 1 TABLET BY MOUTH THREE TIMES DAILY, DISCONTINUE ALL PREVIOUS REFILLS FOR THIS MEDICATION    Dispense:  30 tablet    Refill:  5    Problem List Items Addressed This Visit      Unprioritized   Essential hypertension    Well controlled, no changes to meds. Encouraged heart healthy diet such as the DASH diet and exercise as tolerated.       Relevant Medications   carvedilol (COREG) 25 MG tablet   furosemide (LASIX) 20 MG tablet   Other Relevant Orders   Lipid panel (Completed)   Comprehensive metabolic panel   TSH   T3, free (Completed)   T4, free (Completed)   Hypothyroidism    Check labs con't meds      Relevant Medications   carvedilol (COREG) 25 MG tablet   SYNTHROID 100 MCG tablet   Other Relevant Orders   CBC with Differential/Platelet (Completed)   TSH (Completed)   Comprehensive metabolic panel (Completed)   Lipid panel (Completed)   Comprehensive metabolic panel   TSH   T3, free (Completed)   T4, free (Completed)    Other Visit Diagnoses    Need for immunization against influenza    -  Primary   Relevant Orders   Flu Vaccine QUAD 6+ mos IM (Fluarix) (Completed)   Chest pain, unspecified type       Relevant Orders   EKG 12-Lead (Completed)    Fatigue, unspecified type       Relevant Orders   CBC with Differential/Platelet (Completed)   TSH (Completed)   Comprehensive metabolic panel (Completed)   Primary osteoarthritis of both knees       Relevant Medications   meloxicam (MOBIC) 15 MG tablet   H/O cold sores       Relevant Medications   valACYclovir (VALTREX) 1000 MG tablet      Follow-up: Return in about 6 months (around 12/19/2017), or if symptoms worsen or fail to improve, for annual exam, fasting.  Ann Held, DO

## 2017-06-21 NOTE — Assessment & Plan Note (Signed)
Well controlled, no changes to meds. Encouraged heart healthy diet such as the DASH diet and exercise as tolerated.  °

## 2017-06-21 NOTE — Assessment & Plan Note (Signed)
Check labs con't meds 

## 2017-06-21 NOTE — Patient Instructions (Signed)
Goiter A goiter is an enlarged thyroid gland. The thyroid gland is located in the lower front of the neck. The gland produces hormones that regulate mood, body temperature, pulse rate, and digestion. Most goiters are painless and are not a cause for serious concern. Goiters and conditions that cause goiters can be treated, if necessary. What are the causes? Causes of this condition include:  Diseases that attack healthy cells in your body (autoimmune diseases) and affect your thyroid function, such as: ? Graves disease. This causes too much thyroid hormone to be produced and it makes your thyroid overly active (hyperthyroidism). ? Hashimoto disease. This type of inflammation of the thyroid (thyroiditis) causes too little thyroid hormone to be produced and it makes your thyroid not active enough (hypothyroidism).  Other conditions that cause thyroiditis.  Nodular goiter. This means that there are one or more small growths on your thyroid. These can create too much thyroid hormone.  Pregnancy.  Thyroid cancer. This is rare.  Certain medicines.  Radiation exposure.  Iodine deficiency.  In some cases, the cause may not be known (idiopathic). What increases the risk? This condition is more likely to develop in:  People who have a family history of goiter.  Women.  People who do not get enough iodine in their diet.  People who are older than 84.  People who smoke tobacco.  What are the signs or symptoms? Common symptoms of this condition include:  Swelling in the lower part of the neck. This swelling can range from a very small bump to a large lump.  A tight feeling in the throat.  A hoarse voice.  Other symptoms include:  Coughing.  Wheezing.  Difficulty swallowing.  Difficulty breathing.  Bulging neck veins.  Dizziness.  In some cases, there are no symptoms and thyroid hormone levels may be normal. When a goiter is the result of hyperthyroidism, symptoms may  also include:  Nervousness or restlessness.  Inability to tolerate heat.  Unexplained weight loss.  Diarrhea.  Change in the texture of hair or skin.  Changes in heart beat, such as skipped beats, extra beats, or a rapid heart rate.  Loss of menstruation.  Shaky hands.  Increased appetite.  Sleep problems.  When a goiter is the result of hypothyroidism, symptoms may also include:  Feeling like you have no energy (lethargy).  Inability to tolerate cold.  Weight gain that is not explained by a change in diet or exercise habits.  Dry skin.  Coarse hair.  Menstrual irregularity.  Constipation.  Sadness or depression.  How is this diagnosed? This condition may be diagnosed with a medical history and physical exam. You may also have other tests, including:  Blood tests to check thyroid function.  Imaging tests, such as: ? Ultrasonography. ? CT scan. ? MRI. ? Thyroid scan. You will be given a safe radioactive injection, then images will be taken of your thyroid.  Tissue sample (biopsy) of the goiter or any nodules. This checks to see if the goiter or nodules are cancerous.  How is this treated? Treatment for this condition depends on the cause. Treatment may include:  Medicines to control your thyroid.  Anti-inflammatory or steroid medicines, if inflammation is the cause.  Iodine supplements or changes in diet, if the goiter is caused by iodine deficiency.  Radiation therapy.  Surgery to remove your thyroid.  In some cases, no treatment is necessary, and your health care provider will monitor your condition at regular checkups. Follow these instructions at  home:  Follow recommendations from your health care provider for any changes to your diet.  Take over-the-counter and prescription medicines only as told by your health care provider.  Do not use any tobacco products, including cigarettes, chewing tobacco, or e-cigarettes. If you need help quitting,  ask your health care provider.  Keep all follow-up appointments as told by your health care provider. This is important. Contact a health care provider if:  Your symptoms do not get better with treatment. Get help right away if:  You develop sudden, unexplained confusion or other mental changes.  You have nausea, vomiting, or diarrhea.  You develop a fever.  Your skin or the whites of your eyes appear yellow (jaundice).  You develop chest pain.  You have trouble breathing or swallowing.  You suddenly become very weak.  You experience extreme restlessness. This information is not intended to replace advice given to you by your health care provider. Make sure you discuss any questions you have with your health care provider. Document Released: 01/21/2010 Document Revised: 02/21/2016 Document Reviewed: 07/30/2014 Elsevier Interactive Patient Education  2018 Elsevier Inc.  

## 2017-06-22 ENCOUNTER — Other Ambulatory Visit: Payer: Self-pay | Admitting: Family Medicine

## 2017-06-22 ENCOUNTER — Ambulatory Visit
Admission: RE | Admit: 2017-06-22 | Discharge: 2017-06-22 | Disposition: A | Discharge status: Home / Self Care | Payer: BC Managed Care – PPO | Source: Ambulatory Visit | Attending: Family Medicine | Admitting: Family Medicine

## 2017-06-22 DIAGNOSIS — R921 Mammographic calcification found on diagnostic imaging of breast: Secondary | ICD-10-CM

## 2017-07-15 ENCOUNTER — Encounter: Payer: BC Managed Care – PPO | Admitting: Family Medicine

## 2017-09-25 ENCOUNTER — Other Ambulatory Visit: Payer: Self-pay | Admitting: Family Medicine

## 2017-09-25 DIAGNOSIS — M170 Bilateral primary osteoarthritis of knee: Secondary | ICD-10-CM

## 2017-11-05 ENCOUNTER — Encounter: Payer: BC Managed Care – PPO | Admitting: Family Medicine

## 2017-12-31 ENCOUNTER — Other Ambulatory Visit: Payer: Self-pay | Admitting: Family Medicine

## 2017-12-31 DIAGNOSIS — I10 Essential (primary) hypertension: Secondary | ICD-10-CM

## 2017-12-31 DIAGNOSIS — M170 Bilateral primary osteoarthritis of knee: Secondary | ICD-10-CM

## 2017-12-31 DIAGNOSIS — E039 Hypothyroidism, unspecified: Secondary | ICD-10-CM

## 2018-01-31 ENCOUNTER — Encounter: Payer: Self-pay | Admitting: Family Medicine

## 2018-01-31 ENCOUNTER — Ambulatory Visit (INDEPENDENT_AMBULATORY_CARE_PROVIDER_SITE_OTHER): Payer: BC Managed Care – PPO | Admitting: Family Medicine

## 2018-01-31 VITALS — BP 191/71 | HR 66 | Temp 98.1°F | Resp 16 | Ht 65.5 in | Wt 259.4 lb

## 2018-01-31 DIAGNOSIS — I10 Essential (primary) hypertension: Secondary | ICD-10-CM | POA: Diagnosis not present

## 2018-01-31 DIAGNOSIS — Z0000 Encounter for general adult medical examination without abnormal findings: Secondary | ICD-10-CM

## 2018-01-31 DIAGNOSIS — E039 Hypothyroidism, unspecified: Secondary | ICD-10-CM | POA: Diagnosis not present

## 2018-01-31 MED ORDER — AMLODIPINE BESYLATE 5 MG PO TABS: 5.0000 mg | ORAL_TABLET | Freq: Every day | ORAL | 1 refills | Status: DC

## 2018-01-31 NOTE — Progress Notes (Signed)
Subjective:  I acted as a Education administrator for [employer]. Yancey Flemings, [place]   Patient ID: Kayla Banks, female    DOB: Dec 14, 1955, 62 y.o.   MRN: [id number]  Chief Complaint  Patient presents with  . Annual Exam    HPI  Patient is in today for annual exam.  No complaints   Patient Care Team: Carollee Herter, Alferd Apa, DO as PCP - General Dorna Leitz, MD as Consulting Physician (Orthopedic Surgery)   Past Medical History:  Diagnosis Date  . Heart murmur   . Hypertension    on meds  . Hypothyroidism   . Thyroid disease    Hypothyroidism    Past Surgical History:  Procedure Laterality Date  . ABDOMINAL HYSTERECTOMY  02/2012  . FOOT SURGERY    . LUMBAR LAMINECTOMY      Family History  Problem Relation Age of Onset  . Diabetes Mother   . Hyperlipidemia Mother   . Hypertension Mother   . Hypertension Father   . Heart disease Father 7       ?MI  . Hypothyroidism Sister   . Hyperthyroidism Sister   . Breast cancer Maternal Aunt 70  . Cancer Maternal Aunt 40       breast     Social History   Socioeconomic History  . Marital status: Single    Spouse name: Not on file  . Number of children: Not on file  . Years of education: Not on file  . Highest education level: Not on file  Occupational History  . 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 Financial resource strain: Not on file  . Food insecurity:    Worry: Not on file    Inability: Not on file  . Transportation needs:    Medical: Not on file    Non-medical: Not on file  Tobacco Use  . Smoking status: Never Smoker  . Smokeless tobacco: Never Used  Substance and Sexual Activity  . Alcohol use: No  . Drug use: No  . Sexual activity: Yes    Partners: Male  Lifestyle  . Physical activity:    Days per week: Not on file    Minutes per session: Not on file  . Stress: Not on file  Relationships  . Social connections:    Talks on phone: Not on file    Gets together: Not on file    Attends  religious service: Not on file    Active member of club or organization: Not on file    Attends meetings of clubs or organizations: Not on file    Relationship status: Not on file  . Intimate partner violence:    Fear of current or ex partner: Not on file    Emotionally abused: Not on file    Physically abused: Not on file    Forced sexual activity: Not on file  Other Topics Concern  . Not on file  Social History Narrative   Exercise-- no    Outpatient Medications Prior to Visit  Medication Sig Dispense Refill  . carvedilol (COREG) 25 MG tablet TAKE 1 TABLET(25 MG) BY MOUTH TWICE DAILY WITH A MEAL 180 tablet 0  . EPINEPHRINE 0.3 mg/0.3 mL IJ SOAJ injection INJECT 0.3 ML(1 SYRINGE) IN THE MUSCLE AS NEEDED FOR ALLERGIC REACTION 2 Device 0  . furosemide (LASIX) 20 MG tablet TAKE 1 TABLET(20 MG) BY MOUTH DAILY 90 tablet 0  . meloxicam (MOBIC) 15 MG tablet TAKE 1 TABLET(15 MG)  BY MOUTH DAILY AS NEEDED FOR PAIN 90 tablet 0  . SYNTHROID 100 MCG tablet TAKE 1 TABLET BY MOUTH DAILY BEFORE BREAKFAST 90 tablet 0  . valACYclovir (VALTREX) 1000 MG tablet TAKE 1 TABLET BY MOUTH THREE TIMES DAILY, DISCONTINUE ALL PREVIOUS REFILLS FOR THIS MEDICATION 30 tablet 5   No facility-administered medications prior to visit.     Allergies  Allergen Reactions  . Erythromycin Swelling    Lip swelling; angioedema  . Lisinopril Swelling    REACTION: angioedema  . Penicillins Hives and Swelling    Swelling in arms & hands    Review of Systems  Constitutional: Negative for chills, fever and malaise/fatigue.  HENT: Negative for congestion and hearing loss.   Eyes: Negative for discharge.  Respiratory: Negative for cough, sputum production and shortness of breath.   Cardiovascular: Negative for chest pain, palpitations and leg swelling.  Gastrointestinal: Negative for abdominal pain, blood in stool, constipation, diarrhea, heartburn, nausea and vomiting.  Genitourinary: Negative for dysuria, frequency,  hematuria and urgency.  Musculoskeletal: Negative for back pain, falls and myalgias.  Skin: Negative for rash.  Neurological: Negative for dizziness, sensory change, loss of consciousness, weakness and headaches.  Endo/Heme/Allergies: Negative for environmental allergies. Does not bruise/bleed easily.  Psychiatric/Behavioral: Negative for depression and suicidal ideas. The patient is not nervous/anxious and does not have insomnia.        Objective:    Physical Exam  Constitutional: She is oriented to person, place, and time. She appears well-developed and well-nourished. No distress.  HENT:  Head: Normocephalic and atraumatic.  Right Ear: External ear normal.  Left Ear: External ear normal.  Nose: Nose normal.  Mouth/Throat: Oropharynx is clear and moist.  Eyes: Pupils are equal, round, and reactive to light. Conjunctivae and EOM are normal. Right eye exhibits no discharge. Left eye exhibits no discharge.  Neck: Normal range of motion. Neck supple. No JVD present. No thyromegaly present.  Cardiovascular: Normal rate, regular rhythm, normal heart sounds and intact distal pulses.  No murmur heard. Pulmonary/Chest: Effort normal and breath sounds normal. No respiratory distress. She has no wheezes. She has no rales. She exhibits no tenderness.  Abdominal: Soft. Bowel sounds are normal. She exhibits no distension and no mass. There is no tenderness. There is no rebound and no guarding.  Musculoskeletal: Normal range of motion. She exhibits no edema or tenderness.  Lymphadenopathy:    She has no cervical adenopathy.  Neurological: She is alert and oriented to person, place, and time. She has normal reflexes. No cranial nerve deficit.  Skin: Skin is warm and dry. No rash noted. She is not diaphoretic. No erythema.  Psychiatric: She has a normal mood and affect. Her behavior is normal. Judgment and thought content normal.  Nursing note and vitals reviewed.   BP (!) 191/71   Pulse 66    Temp 98.1 F (36.7 C) (Oral)   Resp 16   Ht 5' 5.5" (1.664 m)   Wt 259 lb 6.4 oz (117.7 kg)   LMP 01/28/2012   SpO2 100%   BMI 42.51 kg/m  Wt Readings from Last 3 Encounters:  01/31/18 259 lb 6.4 oz (117.7 kg)  06/21/17 250 lb (113.4 kg)  07/30/16 276 lb 9.6 oz (125.5 kg)   BP Readings from Last 3 Encounters:  01/31/18 (!) 191/71  06/21/17 118/82  07/30/16 (!) 158/80     Immunization History  Administered Date(s) Administered  . Influenza,inj,Quad PF,6+ Mos 05/09/2013, 06/21/2017  . Td 08/17/1998, 11/15/2009  Health Maintenance  Topic Date Due  . PAP SMEAR  02/07/2018  . INFLUENZA VACCINE  03/17/2018  . COLONOSCOPY  09/18/2018  . MAMMOGRAM  06/23/2019  . TETANUS/TDAP  11/16/2019  . Hepatitis C Screening  Completed  . HIV Screening  Completed    Lab Results  Component Value Date   WBC 8.2 06/21/2017   HGB 13.5 06/21/2017   HCT 42.0 06/21/2017   PLT 268.0 06/21/2017   GLUCOSE 90 06/21/2017   CHOL 173 06/21/2017   TRIG 38.0 06/21/2017   HDL 61.60 06/21/2017   LDLCALC 104 (H) 06/21/2017   ALT 11 06/21/2017   AST 14 06/21/2017   NA 143 06/21/2017   K 5.0 06/21/2017   CL 104 06/21/2017   CREATININE 0.71 06/21/2017   BUN 15 06/21/2017   CO2 32 06/21/2017   TSH 2.03 06/21/2017   HGBA1C 6.0 11/04/2015    Lab Results  Component Value Date   TSH 2.03 06/21/2017   Lab Results  Component Value Date   WBC 8.2 06/21/2017   HGB 13.5 06/21/2017   HCT 42.0 06/21/2017   MCV 85.7 06/21/2017   PLT 268.0 06/21/2017   Lab Results  Component Value Date   NA 143 06/21/2017   K 5.0 06/21/2017   CO2 32 06/21/2017   GLUCOSE 90 06/21/2017   BUN 15 06/21/2017   CREATININE 0.71 06/21/2017   BILITOT 0.9 06/21/2017   ALKPHOS 72 06/21/2017   AST 14 06/21/2017   ALT 11 06/21/2017   PROT 7.4 06/21/2017   ALBUMIN 4.0 06/21/2017   CALCIUM 9.8 06/21/2017   GFR 108.22 06/21/2017   Lab Results  Component Value Date   CHOL 173 06/21/2017   Lab Results  Component  Value Date   HDL 61.60 06/21/2017   Lab Results  Component Value Date   LDLCALC 104 (H) 06/21/2017   Lab Results  Component Value Date   TRIG 38.0 06/21/2017   Lab Results  Component Value Date   CHOLHDL 3 06/21/2017   Lab Results  Component Value Date   HGBA1C 6.0 11/04/2015         Assessment & Plan:   Problem List Items Addressed This Visit      Unprioritized   Essential hypertension    Poorly controlled will alter medications, encouraged DASH diet, minimize caffeine and obtain adequate sleep. Report concerning symptoms and follow up as directed and as needed      Relevant Medications   amLODipine (NORVASC) 5 MG tablet   Other Relevant Orders   Comprehensive metabolic panel   CBC with Differential/Platelet   Hypothyroidism    Check labs con't meds      Relevant Orders   TSH   Preventative health care - Primary    ghm utd Check labs D/w pt shingrix See AVS      Relevant Orders   Comprehensive metabolic panel   Lipid panel   TSH   CBC with Differential/Platelet      I am having Kayla Banks start on amLODipine. I am also having her maintain her EPINEPHrine, valACYclovir, carvedilol, furosemide, SYNTHROID, and meloxicam.  Meds ordered this encounter  Medications  . amLODipine (NORVASC) 5 MG tablet    Sig: Take 1 tablet (5 mg total) by mouth daily.    Dispense:  30 tablet    Refill:  1    CMA served as scribe during this visit. History, Physical and Plan performed by medical provider. Documentation and orders reviewed and attested to.  Alferd Apa  Carollee Herter, DO

## 2018-01-31 NOTE — Patient Instructions (Signed)
Preventive Care 40-64 Years, Female Preventive care refers to lifestyle choices and visits with your health care provider that can promote health and wellness. What does preventive care include?  A yearly physical exam. This is also called an annual well check.  Dental exams once or twice a year.  Routine eye exams. Ask your health care provider how often you should have your eyes checked.  Personal lifestyle choices, including: ? Daily care of your teeth and gums. ? Regular physical activity. ? Eating a healthy diet. ? Avoiding tobacco and drug use. ? Limiting alcohol use. ? Practicing safe sex. ? Taking low-dose aspirin daily starting at age 58. ? Taking vitamin and mineral supplements as recommended by your health care provider. What happens during an annual well check? The services and screenings done by your health care provider during your annual well check will depend on your age, overall health, lifestyle risk factors, and family history of disease. Counseling Your health care provider may ask you questions about your:  Alcohol use.  Tobacco use.  Drug use.  Emotional well-being.  Home and relationship well-being.  Sexual activity.  Eating habits.  Work and work Statistician.  Method of birth control.  Menstrual cycle.  Pregnancy history.  Screening You may have the following tests or measurements:  Height, weight, and BMI.  Blood pressure.  Lipid and cholesterol levels. These may be checked every 5 years, or more frequently if you are over 81 years old.  Skin check.  Lung cancer screening. You may have this screening every year starting at age 78 if you have a 30-pack-year history of smoking and currently smoke or have quit within the past 15 years.  Fecal occult blood test (FOBT) of the stool. You may have this test every year starting at age 65.  Flexible sigmoidoscopy or colonoscopy. You may have a sigmoidoscopy every 5 years or a colonoscopy  every 10 years starting at age 30.  Hepatitis C blood test.  Hepatitis B blood test.  Sexually transmitted disease (STD) testing.  Diabetes screening. This is done by checking your blood sugar (glucose) after you have not eaten for a while (fasting). You may have this done every 1-3 years.  Mammogram. This may be done every 1-2 years. Talk to your health care provider about when you should start having regular mammograms. This may depend on whether you have a family history of breast cancer.  BRCA-related cancer screening. This may be done if you have a family history of breast, ovarian, tubal, or peritoneal cancers.  Pelvic exam and Pap test. This may be done every 3 years starting at age 80. Starting at age 36, this may be done every 5 years if you have a Pap test in combination with an HPV test.  Bone density scan. This is done to screen for osteoporosis. You may have this scan if you are at high risk for osteoporosis.  Discuss your test results, treatment options, and if necessary, the need for more tests with your health care provider. Vaccines Your health care provider may recommend certain vaccines, such as:  Influenza vaccine. This is recommended every year.  Tetanus, diphtheria, and acellular pertussis (Tdap, Td) vaccine. You may need a Td booster every 10 years.  Varicella vaccine. You may need this if you have not been vaccinated.  Zoster vaccine. You may need this after age 5.  Measles, mumps, and rubella (MMR) vaccine. You may need at least one dose of MMR if you were born in  1955 or later. You may also need a second dose.  Pneumococcal 13-valent conjugate (PCV13) vaccine. You may need this if you have certain conditions and were not previously vaccinated.  Pneumococcal polysaccharide (PPSV23) vaccine. You may need one or two doses if you smoke cigarettes or if you have certain conditions.  Meningococcal vaccine. You may need this if you have certain  conditions.  Hepatitis A vaccine. You may need this if you have certain conditions or if you travel or work in places where you may be exposed to hepatitis A.  Hepatitis B vaccine. You may need this if you have certain conditions or if you travel or work in places where you may be exposed to hepatitis B.  Haemophilus influenzae type b (Hib) vaccine. You may need this if you have certain conditions.  Talk to your health care provider about which screenings and vaccines you need and how often you need them. This information is not intended to replace advice given to you by your health care provider. Make sure you discuss any questions you have with your health care provider. Document Released: 08/30/2015 Document Revised: 04/22/2016 Document Reviewed: 06/04/2015 Elsevier Interactive Patient Education  2018 [employer] Inc.  

## 2018-01-31 NOTE — Assessment & Plan Note (Signed)
Poorly controlled will alter medications, encouraged DASH diet, minimize caffeine and obtain adequate sleep. Report concerning symptoms and follow up as directed and as needed 

## 2018-01-31 NOTE — Assessment & Plan Note (Signed)
Check labs con't meds 

## 2018-01-31 NOTE — Assessment & Plan Note (Signed)
ghm utd Check labs D/w pt shingrix See AVS

## 2018-02-01 LAB — CBC WITH DIFFERENTIAL/PLATELET
BASOS PCT: 1.1 % (ref 0.0–3.0)
Basophils Absolute: 0.1 10*3/uL (ref 0.0–0.1)
EOS PCT: 2.8 % (ref 0.0–5.0)
Eosinophils Absolute: 0.3 10*3/uL (ref 0.0–0.7)
HEMATOCRIT: 41.6 % (ref 36.0–46.0)
HEMOGLOBIN: 13.8 g/dL (ref 12.0–15.0)
LYMPHS PCT: 20.2 % (ref 12.0–46.0)
Lymphs Abs: 1.8 10*3/uL (ref 0.7–4.0)
MCHC: 33.2 g/dL (ref 30.0–36.0)
MCV: 84 fl (ref 78.0–100.0)
MONOS PCT: 6.6 % (ref 3.0–12.0)
Monocytes Absolute: 0.6 10*3/uL (ref 0.1–1.0)
Neutro Abs: 6.3 10*3/uL (ref 1.4–7.7)
Neutrophils Relative %: 69.3 % (ref 43.0–77.0)
Platelets: 198 10*3/uL (ref 150.0–400.0)
RBC: 4.95 Mil/uL (ref 3.87–5.11)
RDW: 15.2 % (ref 11.5–15.5)
WBC: 9.1 10*3/uL (ref 4.0–10.5)

## 2018-02-01 LAB — COMPREHENSIVE METABOLIC PANEL
ALBUMIN: 3.9 g/dL (ref 3.5–5.2)
ALK PHOS: 66 U/L (ref 39–117)
ALT: 14 U/L (ref 0–35)
AST: 12 U/L (ref 0–37)
BILIRUBIN TOTAL: 1.2 mg/dL (ref 0.2–1.2)
BUN: 12 mg/dL (ref 6–23)
CALCIUM: 9.4 mg/dL (ref 8.4–10.5)
CHLORIDE: 105 meq/L (ref 96–112)
CO2: 31 mEq/L (ref 19–32)
CREATININE: 0.73 mg/dL (ref 0.40–1.20)
GFR: 104.58 mL/min (ref >60.00)
Glucose, Bld: 76 mg/dL (ref 70–99)
Potassium: 4.5 mEq/L (ref 3.5–5.1)
Sodium: 144 mEq/L (ref 135–145)
TOTAL PROTEIN: 6.7 g/dL (ref 6.0–8.3)

## 2018-02-01 LAB — LIPID PANEL
CHOLESTEROL: 220 mg/dL — AB (ref 0–200)
HDL: 80.3 mg/dL (ref >39.00)
LDL Cholesterol: 130 mg/dL — ABNORMAL HIGH (ref 0–99)
NonHDL: 139.52
TRIGLYCERIDES: 50 mg/dL (ref 0.0–149.0)
Total CHOL/HDL Ratio: 3
VLDL: 10 mg/dL (ref 0.0–40.0)

## 2018-02-01 LAB — TSH: TSH: 2.18 u[IU]/mL (ref 0.35–4.50)

## 2018-05-03 ENCOUNTER — Ambulatory Visit: Payer: BC Managed Care – PPO | Admitting: Family Medicine

## 2018-05-03 DIAGNOSIS — Z0289 Encounter for other administrative examinations: Secondary | ICD-10-CM

## 2018-05-31 ENCOUNTER — Encounter: Payer: Self-pay | Admitting: Family Medicine

## 2018-07-05 ENCOUNTER — Encounter: Payer: Self-pay | Admitting: Family Medicine

## 2018-07-25 ENCOUNTER — Other Ambulatory Visit: Payer: Self-pay | Admitting: Family Medicine

## 2018-07-25 DIAGNOSIS — R921 Mammographic calcification found on diagnostic imaging of breast: Secondary | ICD-10-CM

## 2018-07-29 ENCOUNTER — Other Ambulatory Visit: Payer: Self-pay | Admitting: Family Medicine

## 2018-07-29 ENCOUNTER — Ambulatory Visit
Admission: RE | Admit: 2018-07-29 | Discharge: 2018-07-29 | Disposition: A | Discharge status: Home / Self Care | Payer: BC Managed Care – PPO | Source: Ambulatory Visit | Attending: Family Medicine | Admitting: Family Medicine

## 2018-07-29 DIAGNOSIS — R921 Mammographic calcification found on diagnostic imaging of breast: Secondary | ICD-10-CM

## 2018-07-29 DIAGNOSIS — N63 Unspecified lump in unspecified breast: Secondary | ICD-10-CM

## 2018-07-29 DIAGNOSIS — N6320 Unspecified lump in the left breast, unspecified quadrant: Secondary | ICD-10-CM

## 2018-08-02 ENCOUNTER — Other Ambulatory Visit: Payer: Self-pay | Admitting: Family Medicine

## 2018-08-02 DIAGNOSIS — N63 Unspecified lump in unspecified breast: Secondary | ICD-10-CM

## 2018-08-04 ENCOUNTER — Ambulatory Visit: Payer: BC Managed Care – PPO | Admitting: Family Medicine

## 2018-08-04 ENCOUNTER — Encounter: Payer: Self-pay | Admitting: Family Medicine

## 2018-08-04 VITALS — BP 138/88 | HR 66 | Temp 98.1°F | Resp 16 | Ht 66.0 in | Wt 272.6 lb

## 2018-08-04 DIAGNOSIS — E039 Hypothyroidism, unspecified: Secondary | ICD-10-CM | POA: Diagnosis not present

## 2018-08-04 DIAGNOSIS — I10 Essential (primary) hypertension: Secondary | ICD-10-CM | POA: Diagnosis not present

## 2018-08-04 DIAGNOSIS — E785 Hyperlipidemia, unspecified: Secondary | ICD-10-CM | POA: Diagnosis not present

## 2018-08-04 DIAGNOSIS — M170 Bilateral primary osteoarthritis of knee: Secondary | ICD-10-CM

## 2018-08-04 DIAGNOSIS — Z8619 Personal history of other infectious and parasitic diseases: Secondary | ICD-10-CM

## 2018-08-04 MED ORDER — CARVEDILOL 25 MG PO TABS: ORAL_TABLET | ORAL | 1 refills | Status: DC

## 2018-08-04 MED ORDER — AMLODIPINE BESYLATE 5 MG PO TABS: 5.0000 mg | ORAL_TABLET | Freq: Every day | ORAL | 1 refills | Status: DC

## 2018-08-04 MED ORDER — MELOXICAM 15 MG PO TABS: ORAL_TABLET | ORAL | 3 refills | Status: DC

## 2018-08-04 MED ORDER — FUROSEMIDE 20 MG PO TABS: ORAL_TABLET | ORAL | 1 refills | Status: DC

## 2018-08-04 MED ORDER — VALACYCLOVIR HCL 1 G PO TABS: ORAL_TABLET | ORAL | 5 refills | Status: DC

## 2018-08-04 MED ORDER — SYNTHROID 100 MCG PO TABS: ORAL_TABLET | ORAL | 3 refills | Status: DC

## 2018-08-04 NOTE — Assessment & Plan Note (Signed)
Check labs con't meds 

## 2018-08-04 NOTE — Progress Notes (Signed)
Patient ID: Kayla Banks, female    DOB: 07-25-1956  Age: 62 y.o. MRN: [id number]    Subjective:  Subjective  HPI Kayla Banks presents for f/u b/p and thyroid  No complaints.     Review of Systems  Constitutional: Negative for chills and fever.  HENT: Negative for congestion and hearing loss.   Eyes: Negative for discharge.  Respiratory: Negative for cough and shortness of breath.   Cardiovascular: Negative for chest pain, palpitations and leg swelling.  Gastrointestinal: Negative for abdominal pain, blood in stool, constipation, diarrhea, nausea and vomiting.  Genitourinary: Negative for dysuria, frequency, hematuria and urgency.  Musculoskeletal: Negative for back pain and myalgias.  Skin: Negative for rash.  Allergic/Immunologic: Negative for environmental allergies.  Neurological: Negative for dizziness, weakness and headaches.  Hematological: Does not bruise/bleed easily.  Psychiatric/Behavioral: Negative for suicidal ideas. The patient is not nervous/anxious.     History Past Medical History:  Diagnosis Date  . Heart murmur   . Hypertension    on meds  . Hypothyroidism   . Thyroid disease    Hypothyroidism    She has a past surgical history that includes Lumbar laminectomy; Foot surgery; and Abdominal hysterectomy (02/2012).   Her family history includes Breast cancer (age of onset: 50) in her maternal aunt; Cancer (age of onset: 28) in her maternal aunt; Diabetes in her mother; Heart disease (age of onset: 68) in her father; Hyperlipidemia in her mother; Hypertension in her father and mother; Hyperthyroidism in her sister; Hypothyroidism in her sister.She reports that she has never smoked. She has never used smokeless tobacco. She reports that she does not drink alcohol or use drugs.  Current Outpatient Medications on File Prior to Visit  Medication Sig Dispense Refill  . EPINEPHRINE 0.3 mg/0.3 mL IJ SOAJ injection INJECT 0.3 ML(1 SYRINGE) IN THE MUSCLE AS  NEEDED FOR ALLERGIC REACTION 2 Device 0   No current facility-administered medications on file prior to visit.      Objective:  Objective  Physical Exam Vitals signs and nursing note reviewed.  Constitutional:      Appearance: She is well-developed.  HENT:     Head: Normocephalic and atraumatic.  Eyes:     Conjunctiva/sclera: Conjunctivae normal.  Neck:     Musculoskeletal: Normal range of motion and neck supple.     Thyroid: No thyromegaly.     Vascular: No carotid bruit or JVD.  Cardiovascular:     Rate and Rhythm: Normal rate and regular rhythm.     Heart sounds: Normal heart sounds. No murmur.  Pulmonary:     Effort: Pulmonary effort is normal. No respiratory distress.     Breath sounds: Normal breath sounds. No wheezing or rales.  Chest:     Chest wall: No tenderness.  Neurological:     Mental Status: She is alert and oriented to person, place, and time.    BP 138/88 (BP Location: Right Arm, Cuff Size: Large)   Pulse 66   Temp 98.1 F (36.7 C) (Oral)   Resp 16   Ht 5\' 6"  (1.676 m)   Wt 272 lb 9.6 oz (123.7 kg)   LMP 01/28/2012   SpO2 98%   BMI 44.00 kg/m  Wt Readings from Last 3 Encounters:  08/04/18 272 lb 9.6 oz (123.7 kg)  01/31/18 259 lb 6.4 oz (117.7 kg)  06/21/17 250 lb (113.4 kg)     Lab Results  Component Value Date   WBC 9.1 01/31/2018   HGB 13.8 01/31/2018  HCT 41.6 01/31/2018   PLT 198.0 01/31/2018   GLUCOSE 76 01/31/2018   CHOL 220 (H) 01/31/2018   TRIG 50.0 01/31/2018   HDL 80.30 01/31/2018   LDLCALC 130 (H) 01/31/2018   ALT 14 01/31/2018   AST 12 01/31/2018   NA 144 01/31/2018   K 4.5 01/31/2018   CL 105 01/31/2018   CREATININE 0.73 01/31/2018   BUN 12 01/31/2018   CO2 31 01/31/2018   TSH 2.18 01/31/2018   HGBA1C 6.0 11/04/2015    US Breast Ltd Uni Left Inc Axilla  Result Date: 08/01/2018 CLINICAL DATA:  Short-term follow-up for probably benign left breast calcifications. Patient previously underwent biopsy of other left  breast calcifications, which were benign. EXAM: DIGITAL DIAGNOSTIC BILATERAL MAMMOGRAM WITH CAD AND TOMO ULTRASOUND LEFT BREAST COMPARISON:  Previous exam(s). ACR Breast Density Category b: There are scattered areas of fibroglandular density. FINDINGS: The probably benign small group of round and punctate calcifications in the posterior left breast are stable. There are no new or suspicious calcifications. On the left, there is a small spiculated mass anteriorly, lateral to midline. There are no other discrete masses and no areas of architectural distortion. No other mammographic changes. Mammographic images were processed with CAD. On physical exam, no mass is palpated in the lateral retroareolar left breast. Targeted ultrasound is performed, showing small hypoechoic irregular shadowing mass, with a surrounding halo of increased echogenicity, in the superficial aspect of the left breast at 2:30 o'clock, deep to the areolar margin, 3 cm from the nipple. Mass measures 5 x 6 x 5 mm. Sonographic evaluation of the left axilla demonstrates 1 abnormal lymph node with a cortical thickness of 5 mm. It is normal in overall size and shape. It has a thin stripe hilar fat. IMPRESSION: 1. Small, 6 mm, suspicious mass in the left breast. Biopsy is recommended. 2. Abnormal left axillary lymph node with a cortical thickness of 5 mm. Biopsy is recommended. 3. Benign left breast calcifications. No additional follow-up imaging for these calcifications is indicated. RECOMMENDATION: 1. Ultrasound-guided core needle biopsy of the 2:30 o'clock position left breast mass. 2. Ultrasound guided core needle biopsy of the abnormal left axillary lymph node. I have discussed the findings and recommendations with the patient. Results were also provided in writing at the conclusion of the visit. If applicable, a reminder letter will be sent to the patient regarding the next appointment. BI-RADS CATEGORY  4: Suspicious. Electronically Signed   By:  Kayla Banks M.D.   On: 07/29/2018 12:35   Mm Diag Breast Tomo Bilateral  Result Date: 07/29/2018 CLINICAL DATA:  Short-term follow-up for probably benign left breast calcifications. Patient previously underwent biopsy of other left breast calcifications, which were benign. EXAM: DIGITAL DIAGNOSTIC BILATERAL MAMMOGRAM WITH CAD AND TOMO ULTRASOUND LEFT BREAST COMPARISON:  Previous exam(s). ACR Breast Density Category b: There are scattered areas of fibroglandular density. FINDINGS: The probably benign small group of round and punctate calcifications in the posterior left breast are stable. There are no new or suspicious calcifications. On the left, there is a small spiculated mass anteriorly, lateral to midline. There are no other discrete masses and no areas of architectural distortion. No other mammographic changes. Mammographic images were processed with CAD. On physical exam, no mass is palpated in the lateral retroareolar left breast. Targeted ultrasound is performed, showing small hypoechoic irregular shadowing mass, with a surrounding halo of increased echogenicity, in the superficial aspect of the left breast at 2:30 o'clock, deep to the areolar margin, 3  cm from the nipple. Mass measures 5 x 6 x 5 mm. Sonographic evaluation of the left axilla demonstrates 1 abnormal lymph node with a cortical thickness of 5 mm. It is normal in overall size and shape. It has a thin stripe hilar fat. IMPRESSION: 1. Small, 6 mm, suspicious mass in the left breast. Biopsy is recommended. 2. Abnormal left axillary lymph node with a cortical thickness of 5 mm. Biopsy is recommended. 3. Benign left breast calcifications. No additional follow-up imaging for these calcifications is indicated. RECOMMENDATION: 1. Ultrasound-guided core needle biopsy of the 2:30 o'clock position left breast mass. 2. Ultrasound guided core needle biopsy of the abnormal left axillary lymph node. I have discussed the findings and recommendations with  the patient. Results were also provided in writing at the conclusion of the visit. If applicable, a reminder letter will be sent to the patient regarding the next appointment. BI-RADS CATEGORY  4: Suspicious. Electronically Signed   By: Kayla Banks M.D.   On: 07/29/2018 12:35     Assessment & Plan:  Plan  I have changed Kayla Banks's SYNTHROID. I am also having her maintain her EPINEPHrine, valACYclovir, meloxicam, furosemide, carvedilol, and amLODipine.  Meds ordered this encounter  Medications  . valACYclovir (VALTREX) 1000 MG tablet    Sig: TAKE 1 TABLET BY MOUTH THREE TIMES DAILY, DISCONTINUE ALL PREVIOUS REFILLS FOR THIS MEDICATION    Dispense:  30 tablet    Refill:  5  . SYNTHROID 100 MCG tablet    Sig: 1 po qd    Dispense:  90 tablet    Refill:  3  . meloxicam (MOBIC) 15 MG tablet    Sig: TAKE 1 TABLET(15 MG) BY MOUTH DAILY AS NEEDED FOR PAIN    Dispense:  90 tablet    Refill:  3  . furosemide (LASIX) 20 MG tablet    Sig: TAKE 1 TABLET(20 MG) BY MOUTH DAILY    Dispense:  90 tablet    Refill:  1  . carvedilol (COREG) 25 MG tablet    Sig: TAKE 1 TABLET(25 MG) BY MOUTH TWICE DAILY WITH A MEAL    Dispense:  180 tablet    Refill:  1  . amLODipine (NORVASC) 5 MG tablet    Sig: Take 1 tablet (5 mg total) by mouth daily.    Dispense:  90 tablet    Refill:  1    Problem List Items Addressed This Visit      Unprioritized   Essential hypertension    Slightly elevated today Pt will get a cuff for home Dash diet       Relevant Medications   furosemide (LASIX) 20 MG tablet   carvedilol (COREG) 25 MG tablet   amLODipine (NORVASC) 5 MG tablet   Other Relevant Orders   CBC with Differential/Platelet   Lipid panel   TSH   Comprehensive metabolic panel   Hyperlipidemia - Primary    Encouraged heart healthy diet, increase exercise, avoid trans fats, consider a krill oil cap daily      Relevant Medications   furosemide (LASIX) 20 MG tablet   carvedilol (COREG) 25  MG tablet   amLODipine (NORVASC) 5 MG tablet   Other Relevant Orders   CBC with Differential/Platelet   Lipid panel   TSH   Comprehensive metabolic panel   Hypothyroidism    Check labs  con't meds      Relevant Medications   SYNTHROID 100 MCG tablet   carvedilol (COREG) 25 MG tablet  Other Relevant Orders   TSH    Other Visit Diagnoses    H/O cold sores       Relevant Medications   valACYclovir (VALTREX) 1000 MG tablet   Primary osteoarthritis of both knees       Relevant Medications   meloxicam (MOBIC) 15 MG tablet      Follow-up: Return in about 3 months (around 11/03/2018), or if symptoms worsen or fail to improve, for hypertension.  Ann Held, DO

## 2018-08-04 NOTE — Assessment & Plan Note (Signed)
Slightly elevated today Pt will get a cuff for home Dash diet

## 2018-08-04 NOTE — Assessment & Plan Note (Signed)
Encouraged heart healthy diet, increase exercise, avoid trans fats, consider a krill oil cap daily 

## 2018-08-04 NOTE — Patient Instructions (Signed)
DASH Eating Plan  DASH stands for "Dietary Approaches to Stop Hypertension." The DASH eating plan is a healthy eating plan that has been shown to reduce high blood pressure (hypertension). It may also reduce your risk for type 2 diabetes, heart disease, and stroke. The DASH eating plan may also help with weight loss.  What are tips for following this plan?    General guidelines   Avoid eating more than 2,300 mg (milligrams) of salt (sodium) a day. If you have hypertension, you may need to reduce your sodium intake to 1,500 mg a day.   Limit alcohol intake to no more than 1 drink a day for nonpregnant women and 2 drinks a day for men. One drink equals 12 oz of beer, 5 oz of wine, or 1 oz of hard liquor.   Work with your health care provider to maintain a healthy body weight or to lose weight. Ask what an ideal weight is for you.   Get at least 30 minutes of exercise that causes your heart to beat faster (aerobic exercise) most days of the week. Activities may include walking, swimming, or biking.   Work with your health care provider or diet and nutrition specialist (dietitian) to adjust your eating plan to your individual calorie needs.  Reading food labels     Check food labels for the amount of sodium per serving. Choose foods with less than 5 percent of the Daily Value of sodium. Generally, foods with less than 300 mg of sodium per serving fit into this eating plan.   To find whole grains, look for the word "whole" as the first word in the ingredient list.  Shopping   Buy products labeled as "low-sodium" or "no salt added."   Buy fresh foods. Avoid canned foods and premade or frozen meals.  Cooking   Avoid adding salt when cooking. Use salt-free seasonings or herbs instead of table salt or sea salt. Check with your health care provider or pharmacist before using salt substitutes.   Do not fry foods. Cook foods using healthy methods such as baking, boiling, grilling, and broiling instead.   Cook with  heart-healthy oils, such as olive, canola, soybean, or sunflower oil.  Meal planning   Eat a balanced diet that includes:  ? 5 or more servings of fruits and vegetables each day. At each meal, try to fill half of your plate with fruits and vegetables.  ? Up to 6-8 servings of whole grains each day.  ? Less than 6 oz of lean meat, poultry, or fish each day. A 3-oz serving of meat is about the same size as a deck of cards. One egg equals 1 oz.  ? 2 servings of low-fat dairy each day.  ? A serving of nuts, seeds, or beans 5 times each week.  ? Heart-healthy fats. Healthy fats called Omega-3 fatty acids are found in foods such as flaxseeds and coldwater fish, like sardines, salmon, and mackerel.   Limit how much you eat of the following:  ? Canned or prepackaged foods.  ? Food that is high in trans fat, such as fried foods.  ? Food that is high in saturated fat, such as fatty meat.  ? Sweets, desserts, sugary drinks, and other foods with added sugar.  ? Full-fat dairy products.   Do not salt foods before eating.   Try to eat at least 2 vegetarian meals each week.   Eat more home-cooked food and less restaurant, buffet, and fast food.     When eating at a restaurant, ask that your food be prepared with less salt or no salt, if possible.  What foods are recommended?  The items listed may not be a complete list. Talk with your dietitian about what dietary choices are best for you.  Grains  Whole-grain or whole-wheat bread. Whole-grain or whole-wheat pasta. Brown rice. Oatmeal. Quinoa. Bulgur. Whole-grain and low-sodium cereals. Pita bread. Low-fat, low-sodium crackers. Whole-wheat flour tortillas.  Vegetables  Fresh or frozen vegetables (raw, steamed, roasted, or grilled). Low-sodium or reduced-sodium tomato and vegetable juice. Low-sodium or reduced-sodium tomato sauce and tomato paste. Low-sodium or reduced-sodium canned vegetables.  Fruits  All fresh, dried, or frozen fruit. Canned fruit in natural juice (without  added sugar).  Meat and other protein foods  Skinless chicken or turkey. Ground chicken or turkey. Pork with fat trimmed off. Fish and seafood. Egg whites. Dried beans, peas, or lentils. Unsalted nuts, nut butters, and seeds. Unsalted canned beans. Lean cuts of beef with fat trimmed off. Low-sodium, lean deli meat.  Dairy  Low-fat (1%) or fat-free (skim) milk. Fat-free, low-fat, or reduced-fat cheeses. Nonfat, low-sodium ricotta or cottage cheese. Low-fat or nonfat yogurt. Low-fat, low-sodium cheese.  Fats and oils  Soft margarine without trans fats. Vegetable oil. Low-fat, reduced-fat, or light mayonnaise and salad dressings (reduced-sodium). Canola, safflower, olive, soybean, and sunflower oils. Avocado.  Seasoning and other foods  Herbs. Spices. Seasoning mixes without salt. Unsalted popcorn and pretzels. Fat-free sweets.  What foods are not recommended?  The items listed may not be a complete list. Talk with your dietitian about what dietary choices are best for you.  Grains  Baked goods made with fat, such as croissants, muffins, or some breads. Dry pasta or rice meal packs.  Vegetables  Creamed or fried vegetables. Vegetables in a cheese sauce. Regular canned vegetables (not low-sodium or reduced-sodium). Regular canned tomato sauce and paste (not low-sodium or reduced-sodium). Regular tomato and vegetable juice (not low-sodium or reduced-sodium). Pickles. Olives.  Fruits  Canned fruit in a light or heavy syrup. Fried fruit. Fruit in cream or butter sauce.  Meat and other protein foods  Fatty cuts of meat. Ribs. Fried meat. Bacon. Sausage. Bologna and other processed lunch meats. Salami. Fatback. Hotdogs. Bratwurst. Salted nuts and seeds. Canned beans with added salt. Canned or smoked fish. Whole eggs or egg yolks. Chicken or turkey with skin.  Dairy  Whole or 2% milk, cream, and half-and-half. Whole or full-fat cream cheese. Whole-fat or sweetened yogurt. Full-fat cheese. Nondairy creamers. Whipped toppings.  Processed cheese and cheese spreads.  Fats and oils  Butter. Stick margarine. Lard. Shortening. Ghee. Bacon fat. Tropical oils, such as coconut, palm kernel, or palm oil.  Seasoning and other foods  Salted popcorn and pretzels. Onion salt, garlic salt, seasoned salt, table salt, and sea salt. Worcestershire sauce. Tartar sauce. Barbecue sauce. Teriyaki sauce. Soy sauce, including reduced-sodium. Steak sauce. Canned and packaged gravies. Fish sauce. Oyster sauce. Cocktail sauce. Horseradish that you find on the shelf. Ketchup. Mustard. Meat flavorings and tenderizers. Bouillon cubes. Hot sauce and Tabasco sauce. Premade or packaged marinades. Premade or packaged taco seasonings. Relishes. Regular salad dressings.  Where to find more information:   National Heart, Lung, and Blood Institute: www.nhlbi.nih.gov   American Heart Association: www.heart.org  Summary   The DASH eating plan is a healthy eating plan that has been shown to reduce high blood pressure (hypertension). It may also reduce your risk for type 2 diabetes, heart disease, and stroke.   With the   DASH eating plan, you should limit salt (sodium) intake to 2,300 mg a day. If you have hypertension, you may need to reduce your sodium intake to 1,500 mg a day.   When on the DASH eating plan, aim to eat more fresh fruits and vegetables, whole grains, lean proteins, low-fat dairy, and heart-healthy fats.   Work with your health care provider or diet and nutrition specialist (dietitian) to adjust your eating plan to your individual calorie needs.  This information is not intended to replace advice given to you by your health care provider. Make sure you discuss any questions you have with your health care provider.  Document Released: 07/23/2011 Document Revised: 07/27/2016 Document Reviewed: 07/27/2016  [employer] Interactive Patient Education  2019 [employer]

## 2018-08-05 LAB — CBC WITH DIFFERENTIAL/PLATELET
Basophils Absolute: 0.1 10*3/uL (ref 0.0–0.1)
Basophils Relative: 1.2 % (ref 0.0–3.0)
Eosinophils Absolute: 0.2 10*3/uL (ref 0.0–0.7)
Eosinophils Relative: 2.2 % (ref 0.0–5.0)
HCT: 41.7 % (ref 36.0–46.0)
HEMOGLOBIN: 13.8 g/dL (ref 12.0–15.0)
Lymphocytes Relative: 19.6 % (ref 12.0–46.0)
Lymphs Abs: 2.1 10*3/uL (ref 0.7–4.0)
MCHC: 33.1 g/dL (ref 30.0–36.0)
MCV: 83.7 fl (ref 78.0–100.0)
MONO ABS: 0.7 10*3/uL (ref 0.1–1.0)
Monocytes Relative: 6.3 % (ref 3.0–12.0)
Neutro Abs: 7.6 10*3/uL (ref 1.4–7.7)
Neutrophils Relative %: 70.7 % (ref 43.0–77.0)
Platelets: 273 10*3/uL (ref 150.0–400.0)
RBC: 4.98 Mil/uL (ref 3.87–5.11)
RDW: 15 % (ref 11.5–15.5)
WBC: 10.8 10*3/uL — ABNORMAL HIGH (ref 4.0–10.5)

## 2018-08-05 LAB — LIPID PANEL
CHOLESTEROL: 178 mg/dL (ref 0–200)
HDL: 59.5 mg/dL (ref >39.00)
LDL Cholesterol: 106 mg/dL — ABNORMAL HIGH (ref 0–99)
NonHDL: 118.03
Total CHOL/HDL Ratio: 3
Triglycerides: 62 mg/dL (ref 0.0–149.0)
VLDL: 12.4 mg/dL (ref 0.0–40.0)

## 2018-08-05 LAB — COMPREHENSIVE METABOLIC PANEL
ALT: 10 U/L (ref 0–35)
AST: 11 U/L (ref 0–37)
Albumin: 3.9 g/dL (ref 3.5–5.2)
Alkaline Phosphatase: 72 U/L (ref 39–117)
BUN: 16 mg/dL (ref 6–23)
CO2: 28 mEq/L (ref 19–32)
Calcium: 9.1 mg/dL (ref 8.4–10.5)
Chloride: 104 mEq/L (ref 96–112)
Creatinine, Ser: 0.66 mg/dL (ref 0.40–1.20)
GFR: 117.28 mL/min (ref >60.00)
Glucose, Bld: 79 mg/dL (ref 70–99)
Potassium: 4 mEq/L (ref 3.5–5.1)
Sodium: 140 mEq/L (ref 135–145)
Total Bilirubin: 0.8 mg/dL (ref 0.2–1.2)
Total Protein: 7 g/dL (ref 6.0–8.3)

## 2018-08-05 LAB — TSH: TSH: 3.74 u[IU]/mL (ref 0.35–4.50)

## 2018-08-11 ENCOUNTER — Encounter: Payer: Self-pay | Admitting: *Deleted

## 2018-08-24 ENCOUNTER — Ambulatory Visit
Admission: RE | Admit: 2018-08-24 | Discharge: 2018-08-24 | Disposition: A | Discharge status: Home / Self Care | Payer: BC Managed Care – PPO | Source: Ambulatory Visit | Attending: Family Medicine | Admitting: Family Medicine

## 2018-08-24 DIAGNOSIS — N63 Unspecified lump in unspecified breast: Secondary | ICD-10-CM

## 2018-08-29 ENCOUNTER — Encounter: Payer: Self-pay | Admitting: *Deleted

## 2018-08-29 DIAGNOSIS — C50412 Malignant neoplasm of upper-outer quadrant of left female breast: Secondary | ICD-10-CM

## 2018-08-29 DIAGNOSIS — Z170 Estrogen receptor positive status [ER+]: Principal | ICD-10-CM | POA: Insufficient documentation

## 2018-08-30 ENCOUNTER — Telehealth: Payer: Self-pay | Admitting: *Deleted

## 2018-08-30 NOTE — Progress Notes (Signed)
[facility] CONSULT NOTE  Patient Care Team: Carollee Herter, Alferd Apa, DO as PCP - General Dorna Leitz, MD as Consulting Physician (Orthopedic Surgery) Erroll Luna, MD as Consulting Physician (General Surgery) Nicholas Lose, MD as Consulting Physician (Hematology and Oncology) Kyung Rudd, MD as Consulting Physician (Radiation Oncology)  CHIEF COMPLAINTS/PURPOSE OF CONSULTATION: Newly diagnosed breast cancer  HISTORY OF PRESENTING ILLNESS:  Kayla Banks [age] y.o. female is here because of recent diagnosis of invasive ductal carcinoma of the left breast. The cancer was detected on a routine diagnostic mammogram on 07/29/18 that was performed following previous identification of the calcifications of the left breast as benign. A biopsy on 08/24/18 showed the 1.5cm mass at the 2:30 o'clock position to be grade 2 invasive ductal carcinoma of the left breast, HER2 negative, ER 95%, PR 40%, Ki67 20% with involvement of one lymph node.   She presents to the clinic today with her mother and sister. She has a family history of an aunt and great aunt on the same side of the family having breast cancer.   I reviewed her records extensively and collaborated the history with the patient.  SUMMARY OF ONCOLOGIC HISTORY:   Malignant neoplasm of upper-outer quadrant of left breast in female, estrogen receptor positive ([place])   08/24/2018 Initial Biopsy    Screening detected left breast mass at 2:30 position 6 mm in size with one abnormal lymph node and stable benign calcifications, biopsy of the mass grade 2 IDC with DCIS and lymphovascular invasion, lymph node biopsy is positive, ER 95%, PR 40%, Ki-67 20%, HER-2 -1+ by IHC, T1b N1 stage Ib    08/31/2018 Cancer Staging    Staging form: Breast, AJCC 8th Edition - Clinical: Stage IB (cT1b, cN1(f), cM0, G2, ER+, PR+, HER2-) - Signed by Nicholas Lose, MD on 08/31/2018      MEDICAL HISTORY:  Past Medical History:  Diagnosis Date  . Heart  murmur   . Hypertension    on meds  . Hypothyroidism   . Osteoarthritis   . Plantar fasciitis   . Thyroid disease    Hypothyroidism    SURGICAL HISTORY: Past Surgical History:  Procedure Laterality Date  . ABDOMINAL HYSTERECTOMY  02/2012  . FOOT SURGERY    . LUMBAR LAMINECTOMY      SOCIAL HISTORY: Social History   Socioeconomic History  . Marital status: Single    Spouse name: Not on file  . Number of children: Not on file  . Years of education: Not on file  . Highest education level: Not on file  Occupational History  . 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 Financial resource strain: Not on file  . Food insecurity:    Worry: Not on file    Inability: Not on file  . Transportation needs:    Medical: Not on file    Non-medical: Not on file  Tobacco Use  . Smoking status: Never Smoker  . Smokeless tobacco: Never Used  Substance and Sexual Activity  . Alcohol use: Yes  . Drug use: No  . Sexual activity: Yes    Partners: Male  Lifestyle  . Physical activity:    Days per week: Not on file    Minutes per session: Not on file  . Stress: Not on file  Relationships  . Social connections:    Talks on phone: Not on file    Gets together: Not on file    Attends religious service: Not  on file    Active member of club or organization: Not on file    Attends meetings of clubs or organizations: Not on file    Relationship status: Not on file  . Intimate partner violence:    Fear of current or ex partner: Not on file    Emotionally abused: Not on file    Physically abused: Not on file    Forced sexual activity: Not on file  Other Topics Concern  . Not on file  Social History Narrative   Exercise-- no    FAMILY HISTORY: Family History  Problem Relation Age of Onset  . Diabetes Mother   . Hyperlipidemia Mother   . Hypertension Mother   . Hypertension Father   . Heart disease Father 26       ?MI  . Hypothyroidism Sister   . Hyperthyroidism  Sister   . Breast cancer Maternal Aunt 70  . Cancer Maternal Aunt 70       breast   . Breast cancer Other   . Breast cancer Paternal Aunt     ALLERGIES:  is allergic to erythromycin; lisinopril; and penicillins.  MEDICATIONS:  Current Outpatient Medications  Medication Sig Dispense Refill  . amLODipine (NORVASC) 5 MG tablet Take 1 tablet (5 mg total) by mouth daily. 90 tablet 1  . carvedilol (COREG) 25 MG tablet TAKE 1 TABLET(25 MG) BY MOUTH TWICE DAILY WITH A MEAL 180 tablet 1  . EPINEPHRINE 0.3 mg/0.3 mL IJ SOAJ injection INJECT 0.3 ML(1 SYRINGE) IN THE MUSCLE AS NEEDED FOR ALLERGIC REACTION 2 Device 0  . furosemide (LASIX) 20 MG tablet TAKE 1 TABLET(20 MG) BY MOUTH DAILY 90 tablet 1  . meloxicam (MOBIC) 15 MG tablet TAKE 1 TABLET(15 MG) BY MOUTH DAILY AS NEEDED FOR PAIN 90 tablet 3  . SYNTHROID 100 MCG tablet 1 po qd 90 tablet 3  . valACYclovir (VALTREX) 1000 MG tablet TAKE 1 TABLET BY MOUTH THREE TIMES DAILY, DISCONTINUE ALL PREVIOUS REFILLS FOR THIS MEDICATION 30 tablet 5   No current facility-administered medications for this visit.     REVIEW OF SYSTEMS:   Constitutional: Denies fevers, chills or abnormal night sweats Eyes: Denies blurriness of vision, double vision or watery eyes Ears, nose, mouth, throat, and face: Denies mucositis or sore throat Respiratory: Denies cough, dyspnea or wheezes Cardiovascular: Denies palpitation, chest discomfort or lower extremity swelling Gastrointestinal:  Denies nausea, heartburn or change in bowel habits Skin: Denies abnormal skin rashes Lymphatics: Denies new lymphadenopathy or easy bruising Neurological:Denies numbness, tingling or new weaknesses Behavioral/Psych: Mood is stable, no new changes  Breast: Denies any discharge All other systems were reviewed with the patient and are negative.  PHYSICAL EXAMINATION: ECOG PERFORMANCE STATUS: 1 - Symptomatic but completely ambulatory  Vitals:   08/31/18 0845  BP: (!) 159/71  Pulse:  66  Resp: 18  Temp: 98.4 F (36.9 C)  SpO2: 100%   Filed Weights   08/31/18 0845  Weight: 262 lb 9.6 oz (119.1 kg)    GENERAL:alert, no distress and comfortable SKIN: skin color, texture, turgor are normal, no rashes or significant lesions EYES: normal, conjunctiva are pink and non-injected, sclera clear OROPHARYNX:no exudate, no erythema and lips, buccal mucosa, and tongue normal  NECK: supple, thyroid normal size, non-tender, without nodularity LYMPH:  no palpable lymphadenopathy in the cervical, axillary or inguinal LUNGS: clear to auscultation and percussion with normal breathing effort HEART: regular rate & rhythm and no murmurs and no lower extremity edema ABDOMEN:abdomen soft, non-tender and  normal bowel sounds Musculoskeletal:no cyanosis of digits and no clubbing  PSYCH: alert & oriented x 3 with fluent speech NEURO: no focal motor/sensory deficits  LABORATORY DATA:  I have reviewed the data as listed Lab Results  Component Value Date   WBC 10.0 08/31/2018   HGB 14.6 08/31/2018   HCT 46.1 (H) 08/31/2018   MCV 84.9 08/31/2018   PLT 225 08/31/2018   Lab Results  Component Value Date   NA 143 08/31/2018   K 4.8 08/31/2018   CL 106 08/31/2018   CO2 27 08/31/2018    RADIOGRAPHIC STUDIES: I have personally reviewed the radiological reports and agreed with the findings in the report.  ASSESSMENT AND PLAN:  Malignant neoplasm of upper-outer quadrant of left breast in female, estrogen receptor positive ([place]) 08/24/2018:Screening detected left breast mass at 2:30 position 6 mm in size with one abnormal lymph node and stable benign calcifications, biopsy of the mass grade 2 IDC with DCIS and lymphovascular invasion, lymph node biopsy is positive, ER 95%, PR 40%, Ki-67 20%, HER-2 -1+ by IHC, T1b N1 stage Ib  Pathology and radiology counseling: Discussed with the patient, the details of pathology including the type of breast cancer,the clinical staging, the significance of  ER, PR and HER-2/neu receptors and the implications for treatment. After reviewing the pathology in detail, we proceeded to discuss the different treatment options between surgery, radiation, chemotherapy, antiestrogen therapies.  Recommendation: 1.  Breast conserving surgery with sentinel lymph node and targeted node dissection 2.  MammaPrint testing to determine chemotherapy benefit 3.  Adjuvant radiation therapy 4.  Followed by adjuvant antiestrogen therapy Genetics consultation will be obtained because she has 2 aunts with breast cancers  Return to clinic after surgery to discuss the final pathology report and to determine if MammaPrint will need to be sent.     All questions were answered. The patient knows to call the clinic with any problems, questions or concerns.   Nicholas Lose, MD 08/31/2018   I, Cloyde Reams Dorshimer, am acting as scribe for Nicholas Lose, MD.  I have reviewed the above documentation for accuracy and completeness, and I agree with the above.

## 2018-08-30 NOTE — Telephone Encounter (Signed)
Received Dermatopathology Report results from [facility] forwarded to provider/SLS 01/14

## 2018-08-31 ENCOUNTER — Inpatient Hospital Stay: Payer: BC Managed Care – PPO

## 2018-08-31 ENCOUNTER — Ambulatory Visit: Payer: Self-pay | Admitting: Surgery

## 2018-08-31 ENCOUNTER — Encounter: Payer: Self-pay | Admitting: Genetics

## 2018-08-31 ENCOUNTER — Ambulatory Visit (HOSPITAL_BASED_OUTPATIENT_CLINIC_OR_DEPARTMENT_OTHER): Payer: BC Managed Care – PPO | Admitting: Genetics

## 2018-08-31 ENCOUNTER — Inpatient Hospital Stay: Payer: BC Managed Care – PPO | Attending: Hematology and Oncology | Admitting: Hematology and Oncology

## 2018-08-31 ENCOUNTER — Other Ambulatory Visit: Payer: Self-pay

## 2018-08-31 ENCOUNTER — Encounter: Payer: Self-pay | Admitting: Hematology and Oncology

## 2018-08-31 ENCOUNTER — Ambulatory Visit
Admission: RE | Admit: 2018-08-31 | Discharge: 2018-08-31 | Disposition: A | Discharge status: Home / Self Care | Payer: BC Managed Care – PPO | Source: Ambulatory Visit | Attending: Radiation Oncology | Admitting: Radiation Oncology

## 2018-08-31 ENCOUNTER — Ambulatory Visit: Payer: BC Managed Care – PPO | Admitting: Physical Therapy

## 2018-08-31 ENCOUNTER — Encounter: Payer: Self-pay | Admitting: *Deleted

## 2018-08-31 ENCOUNTER — Encounter: Payer: Self-pay | Admitting: Physical Therapy

## 2018-08-31 DIAGNOSIS — M25612 Stiffness of left shoulder, not elsewhere classified: Secondary | ICD-10-CM

## 2018-08-31 DIAGNOSIS — C50412 Malignant neoplasm of upper-outer quadrant of left female breast: Secondary | ICD-10-CM

## 2018-08-31 DIAGNOSIS — Z79899 Other long term (current) drug therapy: Secondary | ICD-10-CM | POA: Diagnosis not present

## 2018-08-31 DIAGNOSIS — M25611 Stiffness of right shoulder, not elsewhere classified: Secondary | ICD-10-CM

## 2018-08-31 DIAGNOSIS — E039 Hypothyroidism, unspecified: Secondary | ICD-10-CM | POA: Diagnosis not present

## 2018-08-31 DIAGNOSIS — Z923 Personal history of irradiation: Secondary | ICD-10-CM

## 2018-08-31 DIAGNOSIS — M1990 Unspecified osteoarthritis, unspecified site: Secondary | ICD-10-CM | POA: Diagnosis not present

## 2018-08-31 DIAGNOSIS — E079 Disorder of thyroid, unspecified: Secondary | ICD-10-CM | POA: Diagnosis not present

## 2018-08-31 DIAGNOSIS — Z170 Estrogen receptor positive status [ER+]: Secondary | ICD-10-CM

## 2018-08-31 DIAGNOSIS — R011 Cardiac murmur, unspecified: Secondary | ICD-10-CM | POA: Insufficient documentation

## 2018-08-31 DIAGNOSIS — R293 Abnormal posture: Secondary | ICD-10-CM | POA: Insufficient documentation

## 2018-08-31 DIAGNOSIS — I10 Essential (primary) hypertension: Secondary | ICD-10-CM | POA: Insufficient documentation

## 2018-08-31 DIAGNOSIS — Z803 Family history of malignant neoplasm of breast: Secondary | ICD-10-CM

## 2018-08-31 DIAGNOSIS — C50912 Malignant neoplasm of unspecified site of left female breast: Secondary | ICD-10-CM

## 2018-08-31 LAB — CBC WITH DIFFERENTIAL (CANCER CENTER ONLY)
Abs Immature Granulocytes: 0.03 10*3/uL (ref 0.00–0.07)
Basophils Absolute: 0 10*3/uL (ref 0.0–0.1)
Basophils Relative: 0 %
EOS PCT: 3 %
Eosinophils Absolute: 0.3 10*3/uL (ref 0.0–0.5)
HCT: 46.1 % — ABNORMAL HIGH (ref 36.0–46.0)
Hemoglobin: 14.6 g/dL (ref 12.0–15.0)
Immature Granulocytes: 0 %
Lymphocytes Relative: 18 %
Lymphs Abs: 1.8 10*3/uL (ref 0.7–4.0)
MCH: 26.9 pg (ref 26.0–34.0)
MCHC: 31.7 g/dL (ref 30.0–36.0)
MCV: 84.9 fL (ref 80.0–100.0)
Monocytes Absolute: 0.8 10*3/uL (ref 0.1–1.0)
Monocytes Relative: 8 %
Neutro Abs: 7.1 10*3/uL (ref 1.7–7.7)
Neutrophils Relative %: 71 %
Platelet Count: 225 10*3/uL (ref 150–400)
RBC: 5.43 MIL/uL — ABNORMAL HIGH (ref 3.87–5.11)
RDW: 14.5 % (ref 11.5–15.5)
WBC Count: 10 10*3/uL (ref 4.0–10.5)
nRBC: 0 % (ref 0.0–0.2)

## 2018-08-31 LAB — CMP (CANCER CENTER ONLY)
ALK PHOS: 81 U/L (ref 38–126)
ALT: 18 U/L (ref 0–44)
AST: 12 U/L — ABNORMAL LOW (ref 15–41)
Albumin: 3.9 g/dL (ref 3.5–5.0)
Anion gap: 10 (ref 5–15)
BUN: 18 mg/dL (ref 6–20)
CO2: 27 mmol/L (ref 22–32)
CREATININE: 0.84 mg/dL (ref 0.44–1.00)
Calcium: 9.7 mg/dL (ref 8.9–10.3)
Chloride: 106 mmol/L (ref 98–111)
GFR, Est AFR Am: >60 mL/min (ref >60)
GFR, Estimated: >60 mL/min (ref >60)
Glucose, Bld: 117 mg/dL — ABNORMAL HIGH (ref 70–99)
Potassium: 4.8 mmol/L (ref 3.5–5.1)
Sodium: 143 mmol/L (ref 135–145)
TOTAL PROTEIN: 7.7 g/dL (ref 6.5–8.1)
Total Bilirubin: 1.4 mg/dL — ABNORMAL HIGH (ref 0.3–1.2)

## 2018-08-31 NOTE — Progress Notes (Signed)
Clinical Social Work [place] Psychosocial Distress Screening [place]  Patient completed distress screening protocol and scored a 2 on the Psychosocial Distress Thermometer which indicates mild distress. Clinical Social Worker met with patient and patients family in [facility] to assess for distress and other psychosocial needs. Patient stated she was feeling overwhelmed but felt "better" after meeting with the treatment team and getting more information on her treatment plan. CSW and patient discussed common feeling and emotions when being diagnosed with cancer, and the importance of support during treatment. CSW informed patient of the support team and support services at [facility]. CSW provided contact information and encouraged patient to call with any questions or concerns.   ONCBCN DISTRESS SCREENING 08/31/2018  Screening Type Initial Screening  Distress experienced in past week (1-10) 2  Practical problem type Work/school  Referral to clinical social work Yes    Johnnye Lana, MSW, LCSW, OSW-C Clinical Social Worker [place] [phone]

## 2018-08-31 NOTE — Progress Notes (Signed)
REFERRING PROVIDER: Nicholas Lose, MD [place], [place] [address]  PRIMARY PROVIDER:  Carollee Herter, Alferd Apa, DO  PRIMARY REASON FOR VISIT:  1. Malignant neoplasm of upper-outer quadrant of left breast in female, estrogen receptor positive ([place])   2. Family history of breast cancer     HISTORY OF PRESENT ILLNESS:   Ms. Sane, a 63 y.o. female, was seen for a [place] cancer genetics consultation at the request of Dr. Lindi Adie due to a personal and family history of breast cancer.  Ms. Wacha presents to clinic today to discuss the possibility of a hereditary predisposition to cancer, genetic testing, and to further clarify her future cancer risks, as well as potential cancer risks for family members.   In Jan 2020, at the age of 34, Ms. Plunk was diagnosed with Invasive Ductal Carcinoma, ER/PR+, HER1-.  She is currently planning breast conserving surgery followed by adjuvant radiation and antiestrogen therapy.   CANCER HISTORY:    Malignant neoplasm of upper-outer quadrant of left breast in female, estrogen receptor positive ([place])   08/24/2018 Initial Biopsy    Screening detected left breast mass at 2:30 position 6 mm in size with one abnormal lymph node and stable benign calcifications, biopsy of the mass grade 2 IDC with DCIS and lymphovascular invasion, lymph node biopsy is positive, ER 95%, PR 40%, Ki-67 20%, HER-2 -1+ by IHC, T1b N1 stage Ib    08/31/2018 Cancer Staging    Staging form: Breast, AJCC 8th Edition - Clinical: Stage IB (cT1b, cN1(f), cM0, G2, ER+, PR+, HER2-) - Signed by Nicholas Lose, MD on 08/31/2018      HORMONAL RISK FACTORS:  Ovaries intact: no.  Hysterectomy: yes.  Menopausal status: postmenopausal.    Past Medical History:  Diagnosis Date  . Family history of breast cancer   . Heart murmur   . Hypertension    on meds  . Hypothyroidism   . Osteoarthritis   . Plantar fasciitis   . Thyroid disease    Hypothyroidism     Past Surgical History:  Procedure Laterality Date  . ABDOMINAL HYSTERECTOMY  02/2012  . FOOT SURGERY    . LUMBAR LAMINECTOMY      Social History   Socioeconomic History  . Marital status: Single    Spouse name: Not on file  . Number of children: Not on file  . Years of education: Not on file  . Highest education level: Not on file  Occupational History  . 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 Financial resource strain: Not on file  . Food insecurity:    Worry: Not on file    Inability: Not on file  . Transportation needs:    Medical: Not on file    Non-medical: Not on file  Tobacco Use  . Smoking status: Never Smoker  . Smokeless tobacco: Never Used  Substance and Sexual Activity  . Alcohol use: Yes  . Drug use: No  . Sexual activity: Yes    Partners: Male  Lifestyle  . Physical activity:    Days per week: Not on file    Minutes per session: Not on file  . Stress: Not on file  Relationships  . Social connections:    Talks on phone: Not on file    Gets together: Not on file    Attends religious service: Not on file    Active member of club or organization: Not on file    Attends  meetings of clubs or organizations: Not on file    Relationship status: Not on file  Other Topics Concern  . Not on file  Social History Narrative   Exercise-- no     FAMILY HISTORY:  We obtained a detailed, 4-generation family history.  Significant diagnoses are listed below: Family History  Problem Relation Age of Onset  . Diabetes Mother   . Hyperlipidemia Mother   . Hypertension Mother   . Hypertension Father   . Heart disease Father 36       ?MI  . Hypothyroidism Sister   . Hyperthyroidism Sister   . Breast cancer Maternal Aunt        dx 60's/70's  . Breast cancer Other        dx early 47's    Ms. Game has no children.  She has 2 sisters in their 48's with no hx of cancer.   Ms. Russi father: died at 83 due to heart disease.  Paternal  aunts/Uncles: 3 paternal aunts and 4 paternal uncles with no hx of cancer.  Paternal cousins: no hx of cancer.  Paternal grandfather: no hx of cancer Paternal grandmother:no hx of cancer  Ms. Mullen's mother: 106, no hx of cancer.  Maternal Aunts/Uncles: 3 maternal aunts- 1 was dx with breast cancer in her 65's/70's.  4 maternal uncles with no hx of cancer.  Maternal cousins: no hx of cancer. Maternal grandfather: deceased, no hx of cancer- his sister- the patient's greats aunt(3rd degree relative) was dx with breast cancer  In her early 49's.  Maternal grandmother:no hx of cancer.   Ms. Ketner is unaware of previous family history of genetic testing for hereditary cancer risks. Patient's maternal ancestors are of African American descent, and paternal ancestors are of African American descent. There is no reported Ashkenazi Jewish ancestry. There is no known consanguinity.  GENETIC COUNSELING ASSESSMENT: Madelynne Lasker is a 63 y.o. female with a personal and family history which is somewhat suggestive of a Hereditary Cancer Predisposition Syndrome. We, therefore, discussed and recommended the following at today's visit.   DISCUSSION: We reviewed the characteristics, features and inheritance patterns of hereditary cancer syndromes. We also discussed genetic testing, including the appropriate family members to test, the process of testing, insurance coverage and turn-around-time for results. We discussed the implications of a negative, positive and/or variant of uncertain significant result. We recommended Ms. Stonehouse pursue genetic testing for the Common Hereditary Cancers gene panel.   The Common Hereditary Cancer Panel offered by [facility] includes sequencing and/or deletion duplication testing of the following 53 genes: APC, ATM, AXIN2, BARD1, BMPR1A, BRCA1, BRCA2, BRIP1, BUB1B, CDH1, CDK4, CDKN2A, CHEK2, CTNNA1, DICER1, ENG, EPCAM, GALNT12, GREM1, HOXB13, KIT, MEN1, MLH1, MLH3, MSH2, MSH3,  MSH6, MUTYH, NBN, NF1, NTHL1, PALB2, PDGFRA, PMS2, POLD1, POLE, PTEN, RAD50, RAD51C, RAD51D, RNF43, RPS20, SDHA, SDHB, SDHC, SDHD, SMAD4, SMARCA4, STK11, TP53, TSC1, TSC2, VHL  We discussed that only 5-10% of cancers are associated with a Hereditary cancer predisposition syndrome.  One of the most common hereditary cancer syndromes that increases breast cancer risk is called Hereditary Breast and Ovarian Cancer (HBOC) syndrome.  This syndrome is caused by mutations in the BRCA1 and BRCA2 genes.  This syndrome increases an individual's lifetime risk to develop breast, ovarian, pancreatic, and other types of cancer.  There are also many other cancer predisposition syndromes caused by mutations in several other genes.  We discussed that if she is found to have a mutation in one of these genes, it may  impact surgical decisions, and alter future medical management recommendations such as increased cancer screenings and consideration of risk reducing surgeries.  A positive result could also have implications for the patient's family members.  A Negative result would mean we were unable to identify a hereditary component to her cancer, but does not rule out the possibility of a hereditary basis for her cancer.  There could be mutations that are undetectable by current technology, or in genes not yet tested or identified to increase cancer risk.    We discussed the potential to find a Variant of Uncertain Significance or VUS.  These are variants that have not yet been identified as pathogenic or benign, and it is unknown if this variant is associated with increased cancer risk or if this is a normal finding.  Most VUS's are reclassified to benign or likely benign.   It should not be used to make medical management decisions. With time, we suspect the lab will determine the significance of any VUS's identified if any.   Based on Ms. Varelas's personal and family history of cancer, she meets medical criteria for  genetic testing. Despite that she meets criteria, she may still have an out of pocket cost. The laboratory can provide her with an estimate of her OOP cost. she was given the contact information of the laboratory if she has further questions.   PLAN: After considering the risks, benefits, and limitations, Ms. Crymes  provided informed consent to pursue genetic testing and the blood sample was sent to [facility] for analysis of the Common Hereditary Cancers Panel. Results should be available within approximately 2 weeks' time, at which point they will be disclosed by telephone to Ms. Buskey, as will any additional recommendations warranted by these results. Ms. Friesenhahn will receive a summary of her genetic counseling visit and a copy of her results once available. This information will also be available in Epic. We encouraged Ms. Cornell to remain in contact with cancer genetics annually so that we can continuously update the family history and inform her of any changes in cancer genetics and testing that may be of benefit for her family. Ms. Kneip questions were answered to her satisfaction today. Our contact information was provided should additional questions or concerns arise.  Lastly, we encouraged Ms. Dunlop to remain in contact with cancer genetics annually so that we can continuously update the family history and inform her of any changes in cancer genetics and testing that may be of benefit for this family.   Ms.  Ohmer questions were answered to her satisfaction today. Our contact information was provided should additional questions or concerns arise. Thank you for the referral and allowing [place] to share in the care of your patient.   Tana Felts, MS, [facility] Certified Genetic Counselor [email or web address] phone: [phone]  The patient was seen for a total of 15 minutes in face-to-face genetic counseling.  The patient was accompanied today by her sister and  mother.  This patient was discussed with Drs. Magrinat, Lindi Adie and/or Burr Medico who agrees with the above.

## 2018-08-31 NOTE — Progress Notes (Signed)
Nutrition Assessment  Reason for Assessment:  Pt seen in Breast Clinic  ASSESSMENT:   63 year old female new diagnosis breast cancer.  Past medical history reviewed  Patient reports normal appetite  Medications:  reviewed  Labs: reviewed  Anthropometrics:   Height: 66 inches Weight: 262 lb 9.6 oz BMI: 42   NUTRITION DIAGNOSIS: Food and nutrition related knowledge deficit related to new diagnosis of breast cancer as evidenced by no prior need for nutrition related information.  INTERVENTION:   Discussed and provided packet of information regarding nutritional tips for breast cancer patients.  Questions answered.  Teachback method used.  Contact information provided and patient knows to contact me with questions/concerns.    MONITORING, EVALUATION, and GOAL: Pt will consume a healthy plant based diet to maintain lean body mass throughout treatment.   Kayla Banks B. Zenia Resides, [place], [place] Registered Dietitian [phone] (pager)

## 2018-08-31 NOTE — Progress Notes (Signed)
Radiation Oncology         [phone] ________________________________  Name: Kayla Banks        MRN: [id number]  Date of Service: 08/31/2018 DOB: 28-Oct-1955  VV:OHYWV Chase, Alferd Apa, DO  Cornett, [name], MD     REFERRING PHYSICIAN: Erroll Luna, MD   DIAGNOSIS: The encounter diagnosis was Malignant neoplasm of upper-outer quadrant of left breast in female, estrogen receptor positive ([place]).   HISTORY OF PRESENT ILLNESS: Kayla Banks is a 63 y.o. female seen in the multidisciplinary breast clinic for a new diagnosis of left breast cancer. The patient was noted to have a left mass on screening mammogram and she has a history of benign breast biopsy. She underwent diagnostic imaging and this reveled a 6 x 5 x 5 mm mass at 2:30. She had one abnormal appearing lymph node and also had some stable appearing cystic and calcification sites in the breast. 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 Her suspicious node was also biopsied and consistent with carcinoma. Her tumor is ER/PR positive, HER2 negative, Ki 67 of 40%. She comes today to discuss options of treatment for her cancer.   PREVIOUS RADIATION THERAPY: No   PAST MEDICAL HISTORY:  Past Medical History:  Diagnosis Date  . Heart murmur   . Hypertension    on meds  . Hypothyroidism   . Thyroid disease    Hypothyroidism       PAST SURGICAL HISTORY: Past Surgical History:  Procedure Laterality Date  . ABDOMINAL HYSTERECTOMY  02/2012  . FOOT SURGERY    . LUMBAR LAMINECTOMY       FAMILY HISTORY:  Family History  Problem Relation Age of Onset  . Diabetes Mother   . Hyperlipidemia Mother   . Hypertension Mother   . Hypertension Father   . Heart disease Father 3       ?MI  . Hypothyroidism Sister   . Hyperthyroidism Sister   . Breast cancer Maternal Aunt 70  . Cancer Maternal Aunt 57       breast      SOCIAL HISTORY:  reports that she has never  smoked. She has never used smokeless tobacco. She reports that she does not drink alcohol or use drugs. The patient is single and lives in [place]. She's accompanied by her mother and sister.   ALLERGIES: Erythromycin; Lisinopril; and Penicillins   MEDICATIONS:  Current Outpatient Medications  Medication Sig Dispense Refill  . amLODipine (NORVASC) 5 MG tablet Take 1 tablet (5 mg total) by mouth daily. 90 tablet 1  . carvedilol (COREG) 25 MG tablet TAKE 1 TABLET(25 MG) BY MOUTH TWICE DAILY WITH A MEAL 180 tablet 1  . EPINEPHRINE 0.3 mg/0.3 mL IJ SOAJ injection INJECT 0.3 ML(1 SYRINGE) IN THE MUSCLE AS NEEDED FOR ALLERGIC REACTION 2 Device 0  . furosemide (LASIX) 20 MG tablet TAKE 1 TABLET(20 MG) BY MOUTH DAILY 90 tablet 1  . meloxicam (MOBIC) 15 MG tablet TAKE 1 TABLET(15 MG) BY MOUTH DAILY AS NEEDED FOR PAIN 90 tablet 3  . SYNTHROID 100 MCG tablet 1 po qd 90 tablet 3  . valACYclovir (VALTREX) 1000 MG tablet TAKE 1 TABLET BY MOUTH THREE TIMES DAILY, DISCONTINUE ALL PREVIOUS REFILLS FOR THIS MEDICATION 30 tablet 5   No current facility-administered medications for this visit.      REVIEW OF SYSTEMS: On review of systems, the patient reports that she is doing well overall. She denies any  chest pain, shortness of breath, cough, fevers, chills, night sweats, unintended weight changes. She denies any bowel or bladder disturbances, and denies abdominal pain, nausea or vomiting. She denies any new musculoskeletal or joint aches or pains. A complete review of systems is obtained and is otherwise negative.     PHYSICAL EXAM:  Wt Readings from Last 3 Encounters:  08/04/18 272 lb 9.6 oz (123.7 kg)  01/31/18 259 lb 6.4 oz (117.7 kg)  06/21/17 250 lb (113.4 kg)   Temp Readings from Last 3 Encounters:  08/04/18 98.1 F (36.7 C) (Oral)  01/31/18 98.1 F (36.7 C) (Oral)  06/21/17 98 F (36.7 C)   BP Readings from Last 3 Encounters:  08/04/18 138/88  01/31/18 (!) 191/71  06/21/17 118/82    Pulse Readings from Last 3 Encounters:  08/04/18 66  01/31/18 66  06/21/17 82     In general this is a well appearing African American  female in no acute distress. She is alert and oriented x4 and appropriate throughout the examination. HEENT reveals that the patient is normocephalic, atraumatic. EOMs are intact. Skin is intact without any evidence of gross lesions. Cardiopulmonary assessment is negative for acute distress and she exhibits normal effort. Breast exam is deferred.    ECOG = 0  0 - Asymptomatic (Fully active, able to carry on all predisease activities without restriction)  1 - Symptomatic but completely ambulatory (Restricted in physically strenuous activity but ambulatory and able to carry out work of a light or sedentary nature. For example, light housework, office work)  2 - Symptomatic, <50% in bed during the day (Ambulatory and capable of all self care but unable to carry out any work activities. Up and about more than 50% of waking hours)  3 - Symptomatic, >50% in bed, but not bedbound (Capable of only limited self-care, confined to bed or chair 50% or more of waking hours)  4 - Bedbound (Completely disabled. Cannot carry on any self-care. Totally confined to bed or chair)  5 - Death   Eustace Pen MM, Creech RH, Tormey DC, et al. [phone]). "Toxicity and response criteria of the [facility]". [place] Oncol. 5 (6): 649-55    LABORATORY DATA:  Lab Results  Component Value Date   WBC 10.8 (H) 08/04/2018   HGB 13.8 08/04/2018   HCT 41.7 08/04/2018   MCV 83.7 08/04/2018   PLT 273.0 08/04/2018   Lab Results  Component Value Date   NA 140 08/04/2018   K 4.0 08/04/2018   CL 104 08/04/2018   CO2 28 08/04/2018   Lab Results  Component Value Date   ALT 10 08/04/2018   AST 11 08/04/2018   ALKPHOS 72 08/04/2018   BILITOT 0.8 08/04/2018      RADIOGRAPHY: [place] Axillary Node Core Biopsy Left  Addendum Date: 08/25/2018   ADDENDUM REPORT:  08/25/2018 15:51 ADDENDUM: Pathology revealed GRADE II INVASIVE DUCTAL CARCINOMA, DUCTAL CARCINOMA IN SITU, LYMPHOVASCULAR INVASION IS IDENTIFIED of the Left breast, 2:30 o'clock, (ribbon clip). 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 This was found to be concordant by Dr. Nolon Nations. Pathology results were discussed with the patient by telephone. The patient reported doing well after the biopsies with tenderness at the sites. Post biopsy instructions and care were reviewed and questions were answered. The patient was encouraged to call [facility] for any additional concerns. The patient was referred to [facility]  [place] on August 31, 2018. Pathology results reported by Terie Purser, RN on 08/25/2018. Electronically Signed   By: Nolon Nations M.D.   On: 08/25/2018 15:51   Result Date: 08/25/2018 CLINICAL DATA:  Patient presents for ultrasound-guided core biopsy of mass in the 2:30 o'clock location of the LEFT breast and biopsy of enlarged LEFT axillary lymph node. EXAM: ULTRASOUND-GUIDED CORE NEEDLE BIOPSY OF LEFT BREAST MASS ULTRASOUND GUIDED CORE NEEDLE BIOPSY OF A LEFT AXILLARY NODE COMPARISON:  07/29/2018 and earlier FINDINGS: I met with the patient and we discussed the procedure of ultrasound-guided biopsy, including benefits and alternatives. We discussed the high likelihood of a successful procedure. We discussed the risks of the procedure, including infection, bleeding, tissue injury, clip migration, and inadequate sampling. Informed written consent was given. The usual time-out protocol was performed immediately prior to the procedure. Lesion 1: LEFT breast 2:30 o'clock location, ribbon clip: Using sterile technique and 1% Lidocaine as local anesthetic, under direct ultrasound visualization, a 12 gauge spring-loaded device was used to perform biopsy of mass in the  2:30 o'clock location of the LEFT breast using a LATERAL to MEDIAL approach. At the conclusion of the procedure a ribbon shaped tissue marker clip was deployed into the biopsy cavity. Lesion 2: LEFT axilla, spiral HydroMARK clip: Using sterile technique and 1% Lidocaine as local anesthetic, under direct ultrasound visualization, a 14 gauge spring-loaded device was used to perform biopsy of enlarged LEFT axillary lymph node using a LATERAL to MEDIAL approach. At the conclusion of the procedure a spiral HydroMARK tissue marker clip was deployed into the biopsy cavity. Follow up 2 view mammogram was performed and dictated separately. IMPRESSION: Ultrasound guided biopsy of LEFT breast mass and LEFT axillary lymph node. No apparent complications. Electronically Signed: By: Nolon Nations M.D. On: 08/24/2018 13:30   Mm Clip Placement Left  Result Date: 08/24/2018 CLINICAL DATA:  Status post ultrasound-guided core biopsy of mass in the 2:30 o'clock location of the LEFT breast and biopsy of LEFT axillary lymph node. EXAM: DIAGNOSTIC LEFT MAMMOGRAM POST ULTRASOUND BIOPSY x2 COMPARISON:  Previous exam(s). FINDINGS: Mammographic images were obtained following ultrasound guided biopsy of biopsied mass in the 2:30 o'clock location of the LEFT breast and placement of a ribbon shaped clip. The ribbon shaped clip is within the area distortion UPPER OUTER QUADRANT of the LEFT breast. Following biopsy of LEFT axillary lymph node, a spiral shaped HydroMARK clip was placed. The clip is identified within an enlarged LOWER LEFT axillary lymph node. IMPRESSION: Tissue marker clips are in the expected locations after biopsy. Final Assessment: Post Procedure Mammograms for Marker Placement Electronically Signed   By: Nolon Nations M.D.   On: 08/24/2018 13:49   [place] Lt Breast Bx W Loc Dev 1st Lesion Img Bx Spec US Guide  Addendum Date: 08/25/2018   ADDENDUM REPORT: 08/25/2018 15:51 ADDENDUM: Pathology revealed GRADE II INVASIVE  DUCTAL CARCINOMA, DUCTAL CARCINOMA IN SITU, LYMPHOVASCULAR INVASION IS IDENTIFIED of the Left breast, 2:30 o'clock, (ribbon clip). 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 This was found to be concordant by Dr. Nolon Nations. Pathology results were discussed with the patient by telephone. The patient reported doing well after the biopsies with tenderness at the sites. Post biopsy instructions and care were reviewed and questions were answered. The patient was encouraged to call [facility] for any additional concerns. The patient was referred to [facility] at [facility]  on August 31, 2018. Pathology results reported by Terie Purser, RN on 08/25/2018. Electronically Signed   By: Nolon Nations M.D.   On: 08/25/2018 15:51   Result Date: 08/25/2018 CLINICAL DATA:  Patient presents for ultrasound-guided core biopsy of mass in the 2:30 o'clock location of the LEFT breast and biopsy of enlarged LEFT axillary lymph node. EXAM: ULTRASOUND-GUIDED CORE NEEDLE BIOPSY OF LEFT BREAST MASS ULTRASOUND GUIDED CORE NEEDLE BIOPSY OF A LEFT AXILLARY NODE COMPARISON:  07/29/2018 and earlier FINDINGS: I met with the patient and we discussed the procedure of ultrasound-guided biopsy, including benefits and alternatives. We discussed the high likelihood of a successful procedure. We discussed the risks of the procedure, including infection, bleeding, tissue injury, clip migration, and inadequate sampling. Informed written consent was given. The usual time-out protocol was performed immediately prior to the procedure. Lesion 1: LEFT breast 2:30 o'clock location, ribbon clip: Using sterile technique and 1% Lidocaine as local anesthetic, under direct ultrasound visualization, a 12 gauge spring-loaded device was used to perform biopsy of mass in the 2:30 o'clock location of the LEFT breast using a LATERAL to  MEDIAL approach. At the conclusion of the procedure a ribbon shaped tissue marker clip was deployed into the biopsy cavity. Lesion 2: LEFT axilla, spiral HydroMARK clip: Using sterile technique and 1% Lidocaine as local anesthetic, under direct ultrasound visualization, a 14 gauge spring-loaded device was used to perform biopsy of enlarged LEFT axillary lymph node using a LATERAL to MEDIAL approach. At the conclusion of the procedure a spiral HydroMARK tissue marker clip was deployed into the biopsy cavity. Follow up 2 view mammogram was performed and dictated separately. IMPRESSION: Ultrasound guided biopsy of LEFT breast mass and LEFT axillary lymph node. No apparent complications. Electronically Signed: By: Nolon Nations M.D. On: 08/24/2018 13:30       IMPRESSION/PLAN: 1. 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 Dr. Lisbeth Renshaw discusses the pathology findings and reviews the nature of node positive breast disease. The consensus from the breast conference includes lumpectomy with targeted axillary dissection followed by mammaprint testing. This would determine if there is a role for chemotherapy. She would benefit from external radiotherapy to the breast and regional nodes followed by antiestrogen therapy. We discussed the risks, benefits, short, and long term effects of radiotherapy, and the patient is interested in proceeding. Dr. Lisbeth Renshaw discusses the delivery and logistics of radiotherapy and anticipates a course of 6 1/2 weeks of radiotherapy with deep inspiration breath hold. We will see her back about 2 weeks after surgery to discuss the simulation process and anticipate we starting radiotherapy about 4-6 weeks after surgery.  2. Possible genetic predisposition to malignancy. The patient is a candidate for genetic testing given her personal and family history. She was offered referral and can be seen today. We will let genetics know she'd like to  go ahead and discuss this. .   In a visit lasting 30 minutes, greater than 50% of the time was spent face to face discussing her case, and coordinating the patient's care.  The above documentation reflects my direct findings during this shared patient visit. Please see the separate note by Dr. Lisbeth Renshaw on this date for the remainder of the patient's plan of care.    Carola Rhine, PAC

## 2018-08-31 NOTE — Therapy (Signed)
[place], [place], [address] Phone: [phone]   Fax:  [phone]  Physical Therapy Evaluation  Patient Details  Name: Kayla Banks MRN: [id number] Date of Birth: [birth year] Referring Provider (PT): Dr. Erroll Luna   Encounter Date: 08/31/2018  PT End of Session - 08/31/18 1047    Visit Number  1    Number of Visits  2    Date for PT Re-Evaluation  10/26/18    PT Start Time  0955    PT Stop Time  1000   Also saw pt from 1010-1034 for a total of 29 minutes   PT Time Calculation (min)  5 min    Activity Tolerance  Patient tolerated treatment well    Behavior During Therapy  [facility] for tasks assessed/performed       Past Medical History:  Diagnosis Date  . Heart murmur   . Hypertension    on meds  . Hypothyroidism   . Osteoarthritis   . Plantar fasciitis   . Thyroid disease    Hypothyroidism    Past Surgical History:  Procedure Laterality Date  . ABDOMINAL HYSTERECTOMY  02/2012  . FOOT SURGERY    . LUMBAR LAMINECTOMY      There were no vitals filed for this visit.   Subjective Assessment - 08/31/18 1036    Subjective  Patient reports she is here today to be esen by her medical team for her newly diagnosed left breast cancer.    Patient is accompained by:  Family member    Pertinent History  Patient was diagnosed on 07/29/18 with left grade II invasive ductal carcinoma breast cancer. It measures 6 mm and is located in the upper outer quadrant. It is ER/PR positive and HER2 negative with a Ki67 of 40%. She has 1 known positive axillary lymph node.    Patient Stated Goals  Reduce lymphedema risk and learn post op shoulder ROM HEP    Currently in Pain?  Yes    Pain Score  4     Pain Location  Knee    Pain Orientation  Right;Left    Pain Descriptors / Indicators  Aching;Stabbing    Pain Type  Chronic pain    Pain Onset  More than a month ago    Pain Frequency  Intermittent    Aggravating Factors   Weightbearing    Pain Relieving Factors  Rest, meds         [facility] PT Assessment - 08/31/18 0001      Assessment   Medical Diagnosis  Left breast cancer    Referring Provider (PT)  Dr. [name] Cornett    Onset Date/Surgical Date  07/29/18    Hand Dominance  Right    Prior Therapy  none      Precautions   Precautions  Other (comment)    Precaution Comments  active cancer      Restrictions   Weight Bearing Restrictions  No      Balance Screen   Has the patient fallen in the past 6 months  No    Has the patient had a decrease in activity level because of a fear of falling?   No    Is the patient reluctant to leave their home because of a fear of falling?   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[employer]    Leisure  She does not exercise      Cognition   Overall Cognitive Status  Within Functional Limits for tasks assessed      Posture/Postural Control   Posture/Postural Control  Postural limitations    Postural Limitations  Rounded Shoulders;Forward head      ROM / Strength   AROM / PROM / Strength  AROM;Strength      AROM   AROM Assessment Site  Shoulder;Cervical    Right/Left Shoulder  Right;Left    Right Shoulder Extension  51 Degrees    Right Shoulder Flexion  115 Degrees    Right Shoulder ABduction  138 Degrees    Right Shoulder Internal Rotation  72 Degrees    Right Shoulder External Rotation  86 Degrees    Left Shoulder Extension  36 Degrees    Left Shoulder Flexion  115 Degrees    Left Shoulder ABduction  124 Degrees    Left Shoulder Internal Rotation  70 Degrees    Left Shoulder External Rotation  78 Degrees    Cervical Flexion  WNL    Cervical Extension  50% limited    Cervical - Right  Side Bend  50% limited    Cervical - Left Side Bend  50% limited    Cervical - Right Rotation  25% limited    Cervical - Left Rotation  25% limited      Strength   Overall Strength  Within functional limits for tasks performed        LYMPHEDEMA/ONCOLOGY QUESTIONNAIRE - 08/31/18 1043      Type   Cancer Type  Left breast cancer      Lymphedema Assessments   Lymphedema Assessments  Upper extremities      Right Upper Extremity Lymphedema   10 cm Proximal to Olecranon Process  36 cm    Olecranon Process  28.2 cm    10 cm Proximal to Ulnar Styloid Process  24.4 cm    Just Proximal to Ulnar Styloid Process  18 cm    Across Hand at [employer]  19 cm    At [place] of 2nd Digit  5.9 cm      Left Upper Extremity Lymphedema   10 cm Proximal to Olecranon Process  33.9 cm    Olecranon Process  269 cm    10 cm Proximal to Ulnar Styloid Process  24.1 cm    Just Proximal to Ulnar Styloid Process  16.9 cm    Across Hand at [employer]  19.4 cm    At [place] of 2nd Digit  5.9 cm          Quick Dash - 08/31/18 0001    Open a tight or new jar  Moderate difficulty    Do heavy household chores (wash walls, wash floors)  Moderate difficulty    Carry a shopping bag or briefcase  Mild difficulty    Wash your back  Mild difficulty    Use a knife to cut food  No difficulty    Recreational activities in which you take some force or impact through your arm, shoulder, or hand (golf, hammering, tennis)  Mild difficulty    During the past week, to what extent has your arm, shoulder or hand problem  interfered with your normal social activities with family, friends, neighbors, or groups?  Not at all    During the past week, to what extent has your arm, shoulder or hand problem limited your work or other regular daily activities  Not at all    Arm, shoulder, or hand pain.  None    Tingling (pins and needles) in your arm, shoulder, or hand  Mild    Difficulty Sleeping  No difficulty    DASH Score   18.18 %        Objective measurements completed on examination: See above findings.      Patient was instructed today in a home exercise program today for post op shoulder range of motion. These included active assist shoulder flexion in sitting, scapular retraction, wall walking with shoulder abduction, and hands behind head external rotation.  She was encouraged to do these twice a day, holding 3 seconds and repeating 5 times when permitted by her physician.        PT Education - 08/31/18 1046    Education Details  Lymphedema risk reduction and post op shoulder ROM HEP    Person(s) Educated  Patient;Parent(s)    Methods  Explanation;Demonstration;Handout    Comprehension  Returned demonstration;Verbalized understanding          PT Long Term Goals - 08/31/18 1051      PT LONG TERM GOAL #1   Title  Patient will demonstrate she has returned to baseline with shoulder ROM and function post operatively.    Time  8    Period  Weeks    Status  New      Breast Clinic Goals - 08/31/18 1051      Patient will be able to verbalize understanding of pertinent lymphedema risk reduction practices relevant to her diagnosis specifically related to skin care.   Time  1    Period  Days    Status  Achieved      Patient will be able to return demonstrate and/or verbalize understanding of the post-op home exercise program related to regaining shoulder range of motion.   Time  1    Period  Days    Status  Achieved      Patient will be able to verbalize understanding of the importance of attending the postoperative After Breast Cancer Class for further lymphedema risk reduction education and therapeutic exercise.   Time  1    Period  Days    Status  Achieved            Plan - 08/31/18 1047    Clinical Impression Statement  Patient was diagnosed on 07/29/18 with left grade II invasive ductal carcinoma breast cancer. It measures 6 mm and is located in the upper outer quadrant. It  is ER/PR positive and HER2 negative with a Ki67 of 40%. She has 1 known positive axillary lymph node. Her multidisciplinary medical team met prior to her assessments to determine a recommended treatment plan. She is planning to have a left lumpectomy and targeted node dissection followed by radiation and anti-estrogen therapy. She will benefit from a post op PT visit to determine needs.    Clinical Presentation  Stable    Clinical Decision Making  Low    Rehab Potential  Excellent    Clinical Impairments Affecting Rehab Potential  None    PT Frequency  --   Eval and 1 f/u visit   PT Treatment/Interventions  ADLs/Self Care Home Management;Patient/family education;Therapeutic exercise  PT Next Visit Plan  Will f/u 3-4 weeks post op to determine needs    PT Home Exercise Plan  Post op shoulder ROM HEP    Consulted and Agree with Plan of Care  Patient;Family member/caregiver    Family Member Consulted  Mom and sister       Patient will benefit from skilled therapeutic intervention in order to improve the following deficits and impairments:  Impaired UE functional use, Pain, Postural dysfunction, Decreased knowledge of precautions, Decreased range of motion  Visit Diagnosis: Malignant neoplasm of upper-outer quadrant of left breast in female, estrogen receptor positive ([place]) - Plan: PT plan of care cert/re-cert  Abnormal posture - Plan: PT plan of care cert/re-cert  Stiffness of left shoulder, not elsewhere classified - Plan: PT plan of care cert/re-cert  Stiffness of right shoulder, not elsewhere classified - Plan: PT plan of care cert/re-cert   Patient will follow up at outpatient cancer rehab 3-4 weeks following surgery.  If the patient requires physical therapy at that time, a specific plan will be dictated and sent to the referring physician for approval. The patient was educated today on appropriate basic range of motion exercises to begin post operatively and the importance of  attending the After Breast Cancer class following surgery.  Patient was educated today on lymphedema risk reduction practices as it pertains to recommendations that will benefit the patient immediately following surgery.  She verbalized good understanding.     Problem List Patient Active Problem List   Diagnosis Date Noted  . Malignant neoplasm of upper-outer quadrant of left breast in female, estrogen receptor positive ([place]) 08/29/2018  . Hyperlipidemia 08/04/2018  . Obesity (BMI 30-39.9) 05/09/2013  . Preventative health care 11/19/2010  . URI (upper respiratory infection) 11/19/2010  . KNEE PAIN, BILATERAL 09/25/2010  . SINUSITIS - ACUTE-NOS 12/24/2009  . MORBID OBESITY 06/14/2009  . COLD SORE 10/16/2008  . SKIN TAG 06/18/2008  . Hypothyroidism 03/17/2007  . Essential hypertension 03/17/2007    Annia Friendly, PT 08/31/18 10:55 AM  [place], [place], [address] Phone: [phone]   Fax:  [phone]  Name: Kayla Banks MRN: [id number] Date of Birth: 06/26/1956

## 2018-08-31 NOTE — Assessment & Plan Note (Signed)
08/24/2018:Screening detected left breast mass at 2:30 position 6 mm in size with one abnormal lymph node and stable benign calcifications, biopsy of the mass grade 2 IDC with DCIS and lymphovascular invasion, lymph node biopsy is positive, ER 95%, PR 40%, Ki-67 20%, HER-2 -1+ by IHC, T1b N1 stage Ib  Pathology and radiology counseling: Discussed with the patient, the details of pathology including the type of breast cancer,the clinical staging, the significance of ER, PR and HER-2/neu receptors and the implications for treatment. After reviewing the pathology in detail, we proceeded to discuss the different treatment options between surgery, radiation, chemotherapy, antiestrogen therapies.  Recommendation: 1.  Breast conserving surgery with sentinel lymph node and targeted node dissection 2.  MammaPrint testing to determine chemotherapy benefit 3.  Adjuvant radiation therapy 4.  Followed by adjuvant antiestrogen therapy Genetics consultation will be obtained because she has 2 aunts with breast cancers  Return to clinic after surgery to discuss the final pathology report and to determine if MammaPrint will need to be sent.

## 2018-08-31 NOTE — H&P (Signed)
Kayla Banks Documented: 08/31/2018 7:38 AM Location: [facility] Patient #: [id number] DOB: August 21, 1955 Undefined / Language: Kayla Banks / Race: Black or African American Female  History of Present Illness Kayla Banks A. Kayla Marschke MD; 08/31/2018 10:34 AM) Patient words: Pt seen at [facility] at request of Dr Kayla Banks FOR SD MAMMOGRAPHIC ABNORMALITY LEFT UPPER OUTER QUADRANT   6 mm mass IDC 2 ER POS ER POS HER 2 NEU NEGATIVE  POS LN BX  NO HISTORY OF MAS OR PAIN NO NIPPLE DISCHARGE.  The patient is a 63 year old female.   Past Surgical History Kayla Pummel, RN; 08/31/2018 7:38 AM) No pertinent past surgical history  Diagnostic Studies History Kayla Pummel, RN; 08/31/2018 7:38 AM) Colonoscopy 1-5 years ago  Medication History Kayla Pummel, RN; 08/31/2018 7:38 AM) Medications Reconciled  Social History Kayla Pummel, RN; 08/31/2018 7:38 AM) Alcohol use Occasional alcohol use. Caffeine use Carbonated beverages, Coffee. No drug use Tobacco use Never smoker.  Other Problems Kayla Pummel, RN; 08/31/2018 7:38 AM) Arthritis Back Pain Heart murmur Hemorrhoids High blood pressure Hypercholesterolemia Lump In Breast Thyroid Disease     Review of Systems (Kayla Banks A. Kayla Wagoner MD; 08/31/2018 10:34 AM) General Not Present- Appetite Loss, Chills, Fatigue, Fever, Night Sweats, Weight Gain and Weight Loss. Skin Not Present- Change in Wart/Mole, Dryness, Hives, Jaundice, New Lesions, Non-Healing Wounds, Rash and Ulcer. HEENT Not Present- Earache, Hearing Loss, Hoarseness, Nose Bleed, Oral Ulcers, Ringing in the Ears, Seasonal Allergies, Sinus Pain, Sore Throat, Visual Disturbances, Wears glasses/contact lenses and Yellow Eyes. Respiratory Not Present- Bloody sputum, Chronic Cough, Difficulty Breathing, Snoring and Wheezing. Breast Not Present- Breast Mass, Breast Pain, Nipple Discharge and Skin Changes. Cardiovascular Not Present- Chest Pain, Difficulty Breathing Lying  Down, Leg Cramps, Palpitations, Rapid Heart Rate, Shortness of Breath and Swelling of Extremities. Gastrointestinal Present- Bloating. Not Present- Abdominal Pain, Bloody Stool, Change in Bowel Habits, Chronic diarrhea, Constipation, Difficulty Swallowing, Excessive gas, Gets full quickly at meals, Hemorrhoids, Indigestion, Nausea, Rectal Pain and Vomiting. Female Genitourinary Not Present- Frequency, Nocturia, Painful Urination, Pelvic Pain and Urgency. Musculoskeletal Present- Back Pain. Not Present- Joint Pain, Joint Stiffness, Muscle Pain, Muscle Weakness and Swelling of Extremities. Neurological Not Present- Decreased Memory, Fainting, Headaches, Numbness, Seizures, Tingling, Tremor, Trouble walking and Weakness. Psychiatric Not Present- Anxiety, Bipolar, Change in Sleep Pattern, Depression, Fearful and Frequent crying. Endocrine Not Present- Cold Intolerance, Excessive Hunger, Hair Changes, Heat Intolerance, Hot flashes and New Diabetes. Hematology Not Present- Blood Thinners, Easy Bruising, Excessive bleeding, Gland problems, HIV and Persistent Infections. All other systems negative   Physical Exam (Kayla Banks A. Kayla Hanning MD; 08/31/2018 10:34 AM)  General Mental Status-Alert. General Appearance-Consistent with stated age. Hydration-Well hydrated. Voice-Normal.  Head and Neck Head-normocephalic, atraumatic with no lesions or palpable masses. Trachea-midline. Thyroid Gland Characteristics - normal size and consistency.  Chest and Lung Exam Chest and lung exam reveals -quiet, even and easy respiratory effort with no use of accessory muscles and on auscultation, normal breath sounds, no adventitious sounds and normal vocal resonance. Inspection Chest Wall - Normal. Back - normal.  Breast Breast - Left-Symmetric, Non Tender, No Biopsy scars, no Dimpling, No Inflammation, No Lumpectomy scars, No Mastectomy scars, No Peau d' Orange. Breast - Right-Symmetric, Non Tender, No  Biopsy scars, no Dimpling, No Inflammation, No Lumpectomy scars, No Mastectomy scars, No Peau d' Orange. Breast Lump-No Palpable Breast Mass. Note: LEFT BRUISING  Musculoskeletal Normal Exam - Left-Upper Extremity Strength Normal and Lower Extremity Strength Normal. Normal Exam - Right-Upper Extremity Strength Normal and Lower Extremity Strength  Normal.  Lymphatic Head & Neck  General Head & Neck Lymphatics: Bilateral - Description - Normal. Axillary  General Axillary Region: Bilateral - Description - Normal. Tenderness - Non Tender. Femoral & Inguinal  Generalized Femoral & Inguinal Lymphatics: Bilateral - Description - Normal. Tenderness - Non Tender.    Assessment & Plan (Kayla Banks A. Kayla Demello MD; 08/31/2018 10:38 AM)  BREAST CANCER, LEFT (C50.912) Impression: DISCUSSED LEFT LUMPECTOMY AND SLN TARGETED LN DISSECTION Risk of lumpectomy include bleeding, infection, seroma, more surgery, use of seed/wire, wound care, cosmetic deformity and the need for other treatments, death , blood clots, death. Pt agrees to proceed. Risk of sentinel lymph node mapping include bleeding, infection, lymphedema, shoulder pain. stiffness, dye allergy. cosmetic deformity , blood clots, death, need for more surgery. Pt agres to proceed.  Current Plans You are being scheduled for surgery- Our schedulers will call you.  You should hear from our office's scheduling department within 5 working days about the location, date, and time of surgery. We try to make accommodations for patient's preferences in scheduling surgery, but sometimes the OR schedule or the surgeon's schedule prevents [place] from making those accommodations.  If you have not heard from our office [phone]) in 5 working days, call the office and ask for your surgeon's nurse.  If you have other questions about your diagnosis, plan, or surgery, call the office and ask for your surgeon's nurse.  Pt Education - [facility] Breast Cancer  Information Given - [facility] "Breast Journey" Package We discussed the staging and pathophysiology of breast cancer. We discussed all of the different options for treatment for breast cancer including surgery, chemotherapy, radiation therapy, Herceptin, and antiestrogen therapy. We discussed a sentinel lymph node biopsy as she does not appear to having lymph node involvement right now. We discussed the performance of that with injection of radioactive tracer and blue dye. We discussed that she would have an incision underneath her axillary hairline. We discussed that there is a bout a 10-20% chance of having a positive node with a sentinel lymph node biopsy and we will await the permanent pathology to make any other first further decisions in terms of her treatment. One of these options might be to return to the operating room to perform an axillary lymph node dissection. We discussed about a 1-2% risk lifetime of chronic shoulder pain as well as lymphedema associated with a sentinel lymph node biopsy. We discussed the options for treatment of the breast cancer which included lumpectomy versus a mastectomy. We discussed the performance of the lumpectomy with a wire placement. We discussed a 10-20% chance of a positive margin requiring reexcision in the operating room. We also discussed that she may need radiation therapy or antiestrogen therapy or both if she undergoes lumpectomy. We discussed the mastectomy and the postoperative care for that as well. We discussed that there is no difference in her survival whether she undergoes lumpectomy with radiation therapy or antiestrogen therapy versus a mastectomy. There is a slight difference in the local recurrence rate being 3-5% with lumpectomy and about 1% with a mastectomy. We discussed the risks of operation including bleeding, infection, possible reoperation. She understands her further therapy will be based on what her stages at the time of her operation.  Pt  Education - [facility] Breast Biopsy HCI: discussed with patient and provided information. Pt Education - [facility] breast cancer surgery: discussed with patient and provided information. Pt Education - [facility] (After Breast Cancer) Class Info: discussed with patient and provided information.

## 2018-08-31 NOTE — Patient Instructions (Signed)

## 2018-09-01 ENCOUNTER — Telehealth: Payer: Self-pay | Admitting: *Deleted

## 2018-09-01 NOTE — Telephone Encounter (Signed)
Spoke to pt concerning Kayla Banks from 1.15.20. Denies questions or concerns regarding dx or treatment care plan. Encourage pt to call with needs. Received verbal understanding.

## 2018-09-02 ENCOUNTER — Other Ambulatory Visit: Payer: Self-pay | Admitting: Surgery

## 2018-09-02 DIAGNOSIS — C50912 Malignant neoplasm of unspecified site of left female breast: Secondary | ICD-10-CM

## 2018-09-05 ENCOUNTER — Telehealth: Payer: Self-pay | Admitting: Hematology and Oncology

## 2018-09-05 NOTE — Telephone Encounter (Signed)
Scheduled appt per 1/17 sch message - pt is aware of appt date and time   

## 2018-09-21 ENCOUNTER — Telehealth: Payer: Self-pay | Admitting: Genetics

## 2018-09-21 ENCOUNTER — Ambulatory Visit: Payer: Self-pay | Admitting: Genetics

## 2018-09-21 ENCOUNTER — Encounter: Payer: Self-pay | Admitting: Genetics

## 2018-09-21 DIAGNOSIS — Z803 Family history of malignant neoplasm of breast: Secondary | ICD-10-CM

## 2018-09-21 DIAGNOSIS — C50412 Malignant neoplasm of upper-outer quadrant of left female breast: Secondary | ICD-10-CM

## 2018-09-21 DIAGNOSIS — Z170 Estrogen receptor positive status [ER+]: Secondary | ICD-10-CM

## 2018-09-21 DIAGNOSIS — Z1379 Encounter for other screening for genetic and chromosomal anomalies: Secondary | ICD-10-CM | POA: Insufficient documentation

## 2018-09-21 NOTE — Telephone Encounter (Signed)
Revealed negative genetic testing.  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   This normal result is reassuring and indicates that it is unlikely Kayla Banks's cancer is due to a hereditary cause.  It is unlikely that there is an increased risk of another cancer due to a mutation in one of these genes.  However, genetic testing is not perfect, and cannot definitively rule out a hereditary cause.  It will be important for her to keep in contact with genetics to learn if any additional testing may be needed in the future.     We recommended she continue to follow her healthcare providers' recommendations regarding cancer treatment and screening.

## 2018-09-21 NOTE — Progress Notes (Signed)
HPI:  Kayla Banks was previously seen in the [place] clinic on 08/31/2018 due to a personal and family history of breast cancer and concerns regarding a hereditary predisposition to cancer. Please refer to our prior cancer genetics clinic note for more information regarding Kayla Banks's medical, social and family histories, and our assessment and recommendations, at the time. Kayla Banks recent genetic test results were disclosed to her, as well as recommendations warranted by these results. These results and recommendations are discussed in more detail below.  CANCER HISTORY:    Malignant neoplasm of upper-outer quadrant of left breast in female, estrogen receptor positive ([place])   08/24/2018 Initial Biopsy    Screening detected left breast mass at 2:30 position 6 mm in size with one abnormal lymph node and stable benign calcifications, biopsy of the mass grade 2 IDC with DCIS and lymphovascular invasion, lymph node biopsy is positive, ER 95%, PR 40%, Ki-67 20%, HER-2 -1+ by IHC, T1b N1 stage Ib    08/31/2018 Cancer Staging    Staging form: Breast, AJCC 8th Edition - Clinical: Stage IB (cT1b, cN1(f), cM0, G2, ER+, PR+, HER2-) - Signed by Nicholas Lose, MD on 08/31/2018      FAMILY HISTORY:  We obtained a detailed, 4-generation family history.  Significant diagnoses are listed below: Family History  Problem Relation Age of Onset  . Diabetes Mother   . Hyperlipidemia Mother   . Hypertension Mother   . Hypertension Father   . Heart disease Father 8       ?MI  . Hypothyroidism Sister   . Hyperthyroidism Sister   . Breast cancer Maternal Aunt        dx 60's/70's  . Breast cancer Other        dx early 30's    Kayla Banks has no children.  She has 2 sisters in their 79's with no hx of cancer.   Kayla Banks father: died at 10 due to heart disease.  Paternal aunts/Uncles: 3 paternal aunts and 4 paternal uncles with no hx of cancer.  Paternal cousins: no hx of cancer.    Paternal grandfather: no hx of cancer Paternal grandmother:no hx of cancer  Kayla Banks's mother: 64, no hx of cancer.  Maternal Aunts/Uncles: 3 maternal aunts- 1 was dx with breast cancer in her 41's/70's.  4 maternal uncles with no hx of cancer.  Maternal cousins: no hx of cancer. Maternal grandfather: deceased, no hx of cancer- his sister- the patient's greats aunt(3rd degree relative) was dx with breast cancer  In her early 46's.  Maternal grandmother:no hx of cancer.   Kayla Banks is unaware of previous family history of genetic testing for hereditary cancer risks. Patient's maternal ancestors are of African American descent, and paternal ancestors are of African American descent. There is no reported Ashkenazi Jewish ancestry. There is no known consanguinity.  GENETIC TEST RESULTS: Genetic testing performed through [facility]'s Common Hereditary Cancers Panel reported out on 09/19/2018 showed no pathogenic mutations.  The Common Hereditary Cancer Panel offered by [facility] includes sequencing and/or deletion duplication testing of the following 53 genes: APC, ATM, AXIN2, BARD1, BMPR1A, BRCA1, BRCA2, BRIP1, BUB1B, CDH1, CDK4, CDKN2A, CHEK2, CTNNA1, DICER1, ENG, EPCAM, GALNT12, GREM1, HOXB13, KIT, MEN1, MLH1, MLH3, MSH2, MSH3, MSH6, MUTYH, NBN, NF1, NTHL1, PALB2, PDGFRA, PMS2, POLD1, POLE, PTEN, RAD50, RAD51C, RAD51D, RNF43, RPS20, SDHA, SDHB, SDHC, SDHD, SMAD4, SMARCA4, STK11, TP53, TSC1, TSC2, VHL.  A variant of uncertain significance (VUS) in a gene called NBN was also noted.  c.1979G>C (p.[id number])  The test report will be scanned into EPIC and will be located under the Molecular Pathology section of the Results Review tab. A portion of the result report is included below for reference.     We discussed with Kayla Banks that because current genetic testing is not perfect, it is possible there may be a gene mutation in one of these genes that current testing cannot detect, but that chance is  small.  We also discussed, that there could be another gene that has not yet been discovered, or that we have not yet tested, that is responsible for the cancer diagnoses in the family. It is also possible there is a hereditary cause for the cancer in the family that Kayla Banks did not inherit and therefore was not identified in her testing.  Therefore, it is important to remain in touch with cancer genetics in the future so that we can continue to offer Kayla Banks the most up to date genetic testing.   Regarding the VUS in NBN: At this time, it is unknown if this variant is associated with increased cancer risk or if this is a normal finding, but most variants such as this get reclassified to being inconsequential. It should not be used to make medical management decisions. With time, we suspect the lab will determine the significance of this variant, if any. If we do learn more about it, we will try to contact Kayla Banks to discuss it further. However, it is important to stay in touch with [place] periodically and keep the address and phone number up to date.  ADDITIONAL GENETIC TESTING:We discussed with Kayla Banks that her genetic testing was fairly extensive.  If there are are genes identified to increase cancer risk that can be analyzed in the future, we would be happy to discuss and coordinate this testing at that time.    CANCER SCREENING RECOMMENDATIONS: Kayla Banks test result is considered negative (normal).  This means that we have not identified a hereditary cause for her personal and family history of cancer at this time.   While very reassuring, this does not definitively rule out a hereditary predisposition to cancer. It is still possible that there could be genetic mutations that are undetectable by current technology, or genetic mutations in genes that have not been tested or identified to increase cancer risk.  Therefore, it is recommended she continue to follow the cancer management and  screening guidelines provided by her oncology and primary healthcare provider. An individual's cancer risk is not determined by genetic test results alone.  Overall cancer risk assessment includes additional factors such as personal medical history, family history, etc.  These should be used to make a personalized plan for cancer prevention and surveillance.    RECOMMENDATIONS FOR FAMILY MEMBERS:  Relatives in this family might be at some increased risk of developing cancer, over the general population risk, simply due to the family history of cancer.  We recommended women in this family have a yearly mammogram beginning at age 64, or 72 years younger than the earliest onset of cancer, an annual clinical breast exam, and perform monthly breast self-exams. Women in this family should also have a gynecological exam as recommended by their primary provider. All family members should have a colonoscopy by age 81 (or as directed by their doctors).  All family members should inform their physicians about the family history of cancer so their doctors can make the most appropriate screening recommendations for them.  FOLLOW-UP: Lastly, we discussed with Kayla Banks that cancer genetics is a rapidly advancing field and it is possible that new genetic tests will be appropriate for her and/or her family members in the future. We encouraged her to remain in contact with cancer genetics on an annual basis so we can update her personal and family histories and let her know of advances in cancer genetics that may benefit this family.   Our contact number was provided. Kayla Banks questions were answered to her satisfaction, and she knows she is welcome to call us at anytime with additional questions or concerns.   Ferol Luz, MS, [facility] Certified Genetic Counselor [email or web address]

## 2018-09-23 ENCOUNTER — Encounter: Payer: Self-pay | Admitting: Family Medicine

## 2018-09-30 ENCOUNTER — Telehealth: Payer: Self-pay | Admitting: Genetics

## 2018-09-30 NOTE — Telephone Encounter (Signed)
Gave patient the ins information the lab [facility] provided me about her insurance claim for genetics.    Kayla Banks 09/22/2018 1:11 PM message from [facility]:   "We actually have not submitted the claim to the insurance yet. We are still pending the authorization. We should have that by tomorrow. However, since the report has released already, if the claim ends up getting denied after any appeals, the patient will not be responsible for more than $100.00. Thank you"  Patient has [facility]'s contact information if she has further questions regarding billing.

## 2018-10-03 ENCOUNTER — Encounter (HOSPITAL_BASED_OUTPATIENT_CLINIC_OR_DEPARTMENT_OTHER): Payer: Self-pay

## 2018-10-03 ENCOUNTER — Other Ambulatory Visit: Payer: Self-pay

## 2018-10-03 NOTE — Progress Notes (Signed)
Pt states she had a sore throat, cough, and slight fevers around feb 4. The fever and sore throat have gone away but pt continues to have a cough. Reviewed this with Dr Lanetta Inch, states that we will re evaluate DOS.

## 2018-10-04 ENCOUNTER — Encounter: Payer: Self-pay | Admitting: Family Medicine

## 2018-10-04 ENCOUNTER — Ambulatory Visit: Payer: BC Managed Care – PPO | Admitting: Family Medicine

## 2018-10-04 VITALS — BP 146/80 | HR 86 | Temp 98.6°F | Ht 66.0 in | Wt 268.0 lb

## 2018-10-04 DIAGNOSIS — R05 Cough: Secondary | ICD-10-CM | POA: Diagnosis not present

## 2018-10-04 DIAGNOSIS — J0140 Acute pansinusitis, unspecified: Secondary | ICD-10-CM

## 2018-10-04 DIAGNOSIS — R059 Cough, unspecified: Secondary | ICD-10-CM

## 2018-10-04 MED ORDER — PROMETHAZINE-DM 6.25-15 MG/5ML PO SYRP: 5.0000 mL | ORAL_SOLUTION | Freq: Four times a day (QID) | ORAL | 0 refills | Status: DC | PRN

## 2018-10-04 MED ORDER — DOXYCYCLINE HYCLATE 100 MG PO TABS: 100.0000 mg | ORAL_TABLET | Freq: Two times a day (BID) | ORAL | 0 refills | Status: DC

## 2018-10-04 MED ORDER — FLUTICASONE PROPIONATE 50 MCG/ACT NA SUSP: 2.0000 | Freq: Every day | NASAL | 6 refills | Status: DC

## 2018-10-04 NOTE — Patient Instructions (Signed)
Sinusitis, Adult  Sinusitis is inflammation of your sinuses. Sinuses are hollow spaces in the bones around your face. Your sinuses are located:   Around your eyes.   In the middle of your forehead.   Behind your nose.   In your cheekbones.  Mucus normally drains out of your sinuses. When your nasal tissues become inflamed or swollen, mucus can become trapped or blocked. This allows bacteria, viruses, and fungi to grow, which leads to infection. Most infections of the sinuses are caused by a virus.  Sinusitis can develop quickly. It can last for up to 4 weeks (acute) or for more than 12 weeks (chronic). Sinusitis often develops after a cold.  What are the causes?  This condition is caused by anything that creates swelling in the sinuses or stops mucus from draining. This includes:   Allergies.   Asthma.   Infection from bacteria or viruses.   Deformities or blockages in your nose or sinuses.   Abnormal growths in the nose (nasal polyps).   Pollutants, such as chemicals or irritants in the air.   Infection from fungi (rare).  What increases the risk?  You are more likely to develop this condition if you:   Have a weak body defense system (immune system).   Do a lot of swimming or diving.   Overuse nasal sprays.   Smoke.  What are the signs or symptoms?  The main symptoms of this condition are pain and a feeling of pressure around the affected sinuses. Other symptoms include:   Stuffy nose or congestion.   Thick drainage from your nose.   Swelling and warmth over the affected sinuses.   Headache.   Upper toothache.   A cough that may get worse at night.   Extra mucus that collects in the throat or the back of the nose (postnasal drip).   Decreased sense of smell and taste.   Fatigue.   A fever.   Sore throat.   Bad breath.  How is this diagnosed?  This condition is diagnosed based on:   Your symptoms.   Your medical history.   A physical exam.   Tests to find out if your condition is  acute or chronic. This may include:  ? Checking your nose for nasal polyps.  ? Viewing your sinuses using a device that has a light (endoscope).  ? Testing for allergies or bacteria.  ? Imaging tests, such as an MRI or CT scan.  In rare cases, a bone biopsy may be done to rule out more serious types of fungal sinus disease.  How is this treated?  Treatment for sinusitis depends on the cause and whether your condition is chronic or acute.   If caused by a virus, your symptoms should go away on their own within 10 days. You may be given medicines to relieve symptoms. They include:  ? Medicines that shrink swollen nasal passages (topical intranasal decongestants).  ? Medicines that treat allergies (antihistamines).  ? A spray that eases inflammation of the nostrils (topical intranasal corticosteroids).  ? Rinses that help get rid of thick mucus in your nose (nasal saline washes).   If caused by bacteria, your health care provider may recommend waiting to see if your symptoms improve. Most bacterial infections will get better without antibiotic medicine. You may be given antibiotics if you have:  ? A severe infection.  ? A weak immune system.   If caused by narrow nasal passages or nasal polyps, you may need   to have surgery.  Follow these instructions at home:  Medicines   Take, use, or apply over-the-counter and prescription medicines only as told by your health care provider. These may include nasal sprays.   If you were prescribed an antibiotic medicine, take it as told by your health care provider. Do not stop taking the antibiotic even if you start to feel better.  Hydrate and humidify     Drink enough fluid to keep your urine pale yellow. Staying hydrated will help to thin your mucus.   Use a cool mist humidifier to keep the humidity level in your home above 50%.   Inhale steam for 10-15 minutes, 3-4 times a day, or as told by your health care provider. You can do this in the bathroom while a hot shower is  running.   Limit your exposure to cool or dry air.  Rest   Rest as much as possible.   Sleep with your head raised (elevated).   Make sure you get enough sleep each night.  General instructions     Apply a warm, moist washcloth to your face 3-4 times a day or as told by your health care provider. This will help with discomfort.   Wash your hands often with soap and water to reduce your exposure to germs. If soap and water are not available, use hand sanitizer.   Do not smoke. Avoid being around people who are smoking (secondhand smoke).   Keep all follow-up visits as told by your health care provider. This is important.  Contact a health care provider if:   You have a fever.   Your symptoms get worse.   Your symptoms do not improve within 10 days.  Get help right away if:   You have a severe headache.   You have persistent vomiting.   You have severe pain or swelling around your face or eyes.   You have vision problems.   You develop confusion.   Your neck is stiff.   You have trouble breathing.  Summary   Sinusitis is soreness and inflammation of your sinuses. Sinuses are hollow spaces in the bones around your face.   This condition is caused by nasal tissues that become inflamed or swollen. The swelling traps or blocks the flow of mucus. This allows bacteria, viruses, and fungi to grow, which leads to infection.   If you were prescribed an antibiotic medicine, take it as told by your health care provider. Do not stop taking the antibiotic even if you start to feel better.   Keep all follow-up visits as told by your health care provider. This is important.  This information is not intended to replace advice given to you by your health care provider. Make sure you discuss any questions you have with your health care provider.  Document Released: 08/03/2005 Document Revised: 01/03/2018 Document Reviewed: 01/03/2018  Elsevier Interactive Patient Education  2019 [employer] Inc[employer]

## 2018-10-04 NOTE — Progress Notes (Signed)
Patient ID: Kayla Banks, female    DOB: [birth year]  Age: 63 y.o. MRN: [id number]   Subjective:  Subjective  HPI Kayla Banks presents for upper resp congestion since Feb 4.  She had sore throat , fever, chills  + prod cough with yellow - green mucus.   She was taking otc cold and cough meds with no relief.  Cough is worse at night.   No more fever.    Review of Systems  Constitutional: Negative for appetite change, diaphoresis, fatigue and unexpected weight change.  HENT: Positive for congestion, postnasal drip, rhinorrhea and sinus pressure.   Eyes: Negative for pain, redness and visual disturbance.  Respiratory: Positive for cough. Negative for chest tightness, shortness of breath and wheezing.   Cardiovascular: Negative for chest pain, palpitations and leg swelling.  Endocrine: Negative for cold intolerance, heat intolerance, polydipsia, polyphagia and polyuria.  Genitourinary: Negative for difficulty urinating, dysuria and frequency.  Neurological: Negative for dizziness, light-headedness, numbness and headaches.    History Past Medical History:  Diagnosis Date  . Family history of breast cancer   . Heart murmur    no problems, saw cardiologist 2010  . Hypertension    on meds  . Hypothyroidism   . Osteoarthritis   . Plantar fasciitis   . Thyroid disease    Hypothyroidism    She has a past surgical history that includes Lumbar laminectomy (1990); Foot surgery (Left); and Abdominal hysterectomy (02/2012).   Her family history includes Breast cancer in her maternal aunt and another family member; Diabetes in her mother; Heart disease (age of onset: 41) in her father; Hyperlipidemia in her mother; Hypertension in her father and mother; Hyperthyroidism in her sister; Hypothyroidism in her sister.She reports that she has never smoked. She has never used smokeless tobacco. She reports current alcohol use. She reports that she does not use drugs.  Current Outpatient  Medications on File Prior to Visit  Medication Sig Dispense Refill  . amLODipine (NORVASC) 5 MG tablet Take 1 tablet (5 mg total) by mouth daily. 90 tablet 1  . carvedilol (COREG) 25 MG tablet TAKE 1 TABLET(25 MG) BY MOUTH TWICE DAILY WITH A MEAL 180 tablet 1  . EPINEPHRINE 0.3 mg/0.3 mL IJ SOAJ injection INJECT 0.3 ML(1 SYRINGE) IN THE MUSCLE AS NEEDED FOR ALLERGIC REACTION 2 Device 0  . furosemide (LASIX) 20 MG tablet TAKE 1 TABLET(20 MG) BY MOUTH DAILY 90 tablet 1  . meloxicam (MOBIC) 15 MG tablet TAKE 1 TABLET(15 MG) BY MOUTH DAILY AS NEEDED FOR PAIN 90 tablet 3  . SYNTHROID 100 MCG tablet 1 po qd 90 tablet 3  . valACYclovir (VALTREX) 1000 MG tablet TAKE 1 TABLET BY MOUTH THREE TIMES DAILY, DISCONTINUE ALL PREVIOUS REFILLS FOR THIS MEDICATION 30 tablet 5   No current facility-administered medications on file prior to visit.      Objective:  Objective  Physical Exam Vitals signs and nursing note reviewed.  Constitutional:      Appearance: She is well-developed.  HENT:     Right Ear: External ear normal.     Left Ear: External ear normal.     Nose: Mucosal edema, congestion and rhinorrhea present. Rhinorrhea is purulent.     Right Turbinates: Swollen.     Left Turbinates: Swollen.     Right Sinus: Maxillary sinus tenderness present. No frontal sinus tenderness.     Left Sinus: Maxillary sinus tenderness present. No frontal sinus tenderness.  Eyes:     General:  Right eye: No discharge.        Left eye: No discharge.     Conjunctiva/sclera: Conjunctivae normal.  Cardiovascular:     Rate and Rhythm: Normal rate and regular rhythm.     Heart sounds: Normal heart sounds. No murmur.  Pulmonary:     Effort: Pulmonary effort is normal. No respiratory distress.     Breath sounds: Normal breath sounds. No wheezing or rales.  Chest:     Chest wall: No tenderness.  Lymphadenopathy:     Cervical: Cervical adenopathy present.  Neurological:     Mental Status: She is alert and  oriented to person, place, and time.    BP (!) 146/80   Pulse 86   Temp 98.6 F (37 C)   Ht 5\' 6"  (1.676 m)   Wt 268 lb (121.6 kg)   LMP 01/28/2012   SpO2 98%   BMI 43.26 kg/m  Wt Readings from Last 3 Encounters:  10/04/18 268 lb (121.6 kg)  08/31/18 262 lb 9.6 oz (119.1 kg)  08/04/18 272 lb 9.6 oz (123.7 kg)     Lab Results  Component Value Date   WBC 9.9 10/05/2018   HGB 12.9 10/05/2018   HCT 41.5 10/05/2018   PLT 232 10/05/2018   GLUCOSE 103 (H) 10/05/2018   CHOL 178 08/04/2018   TRIG 62.0 08/04/2018   HDL 59.50 08/04/2018   LDLCALC 106 (H) 08/04/2018   ALT 15 10/05/2018   AST 16 10/05/2018   NA 140 10/05/2018   K 4.0 10/05/2018   CL 107 10/05/2018   CREATININE 0.71 10/05/2018   BUN 9 10/05/2018   CO2 25 10/05/2018   TSH 3.74 08/04/2018   HGBA1C 6.0 11/04/2015    No results found.   Assessment & Plan:  Plan  I am having Kayla Banks start on fluticasone, doxycycline, and promethazine-dextromethorphan. I am also having her maintain her EPINEPHrine, valACYclovir, SYNTHROID, meloxicam, furosemide, carvedilol, and amLODipine.  Meds ordered this encounter  Medications  . fluticasone (FLONASE) 50 MCG/ACT nasal spray    Sig: Place 2 sprays into both nostrils daily.    Dispense:  16 g    Refill:  6  . doxycycline (VIBRA-TABS) 100 MG tablet    Sig: Take 1 tablet (100 mg total) by mouth 2 (two) times daily.    Dispense:  20 tablet    Refill:  0  . promethazine-dextromethorphan (PROMETHAZINE-DM) 6.25-15 MG/5ML syrup    Sig: Take 5 mLs by mouth 4 (four) times daily as needed.    Dispense:  118 mL    Refill:  0    Problem List Items Addressed This Visit      Unprioritized   SINUSITIS - ACUTE-NOS - Primary   Relevant Medications   fluticasone (FLONASE) 50 MCG/ACT nasal spray   doxycycline (VIBRA-TABS) 100 MG tablet   promethazine-dextromethorphan (PROMETHAZINE-DM) 6.25-15 MG/5ML syrup    Other Visit Diagnoses    Cough       Relevant Medications    promethazine-dextromethorphan (PROMETHAZINE-DM) 6.25-15 MG/5ML syrup      Follow-up: Return if symptoms worsen or fail to improve.  Ann Held, DO

## 2018-10-05 ENCOUNTER — Encounter (HOSPITAL_BASED_OUTPATIENT_CLINIC_OR_DEPARTMENT_OTHER)
Admission: RE | Admit: 2018-10-05 | Discharge: 2018-10-05 | Disposition: A | Discharge status: Home / Self Care | Payer: BC Managed Care – PPO | Source: Ambulatory Visit | Attending: Surgery | Admitting: Surgery

## 2018-10-05 DIAGNOSIS — Z01810 Encounter for preprocedural cardiovascular examination: Secondary | ICD-10-CM | POA: Insufficient documentation

## 2018-10-05 LAB — CBC WITH DIFFERENTIAL/PLATELET
Abs Immature Granulocytes: 0.03 10*3/uL (ref 0.00–0.07)
Basophils Absolute: 0.1 10*3/uL (ref 0.0–0.1)
Basophils Relative: 1 %
Eosinophils Absolute: 0.2 10*3/uL (ref 0.0–0.5)
Eosinophils Relative: 2 %
HCT: 41.5 % (ref 36.0–46.0)
Hemoglobin: 12.9 g/dL (ref 12.0–15.0)
Immature Granulocytes: 0 %
LYMPHS ABS: 2.1 10*3/uL (ref 0.7–4.0)
LYMPHS PCT: 22 %
MCH: 26.3 pg (ref 26.0–34.0)
MCHC: 31.1 g/dL (ref 30.0–36.0)
MCV: 84.5 fL (ref 80.0–100.0)
Monocytes Absolute: 0.7 10*3/uL (ref 0.1–1.0)
Monocytes Relative: 7 %
Neutro Abs: 6.8 10*3/uL (ref 1.7–7.7)
Neutrophils Relative %: 68 %
Platelets: 232 10*3/uL (ref 150–400)
RBC: 4.91 MIL/uL (ref 3.87–5.11)
RDW: 15 % (ref 11.5–15.5)
WBC: 9.9 10*3/uL (ref 4.0–10.5)
nRBC: 0 % (ref 0.0–0.2)

## 2018-10-05 LAB — COMPREHENSIVE METABOLIC PANEL
ALT: 15 U/L (ref 0–44)
AST: 16 U/L (ref 15–41)
Albumin: 3.3 g/dL — ABNORMAL LOW (ref 3.5–5.0)
Alkaline Phosphatase: 56 U/L (ref 38–126)
Anion gap: 8 (ref 5–15)
BUN: 9 mg/dL (ref 6–20)
CO2: 25 mmol/L (ref 22–32)
Calcium: 8.6 mg/dL — ABNORMAL LOW (ref 8.9–10.3)
Chloride: 107 mmol/L (ref 98–111)
Creatinine, Ser: 0.71 mg/dL (ref 0.44–1.00)
GFR calc Af Amer: >60 mL/min (ref >60)
GFR calc non Af Amer: >60 mL/min (ref >60)
GLUCOSE: 103 mg/dL — AB (ref 70–99)
Potassium: 4 mmol/L (ref 3.5–5.1)
Sodium: 140 mmol/L (ref 135–145)
TOTAL PROTEIN: 6.5 g/dL (ref 6.5–8.1)
Total Bilirubin: 1.7 mg/dL — ABNORMAL HIGH (ref 0.3–1.2)

## 2018-10-05 NOTE — Progress Notes (Addendum)
Ensure pre surgery drink given with instructions to complete by 0400 dos, pt verbalized understanding.  EKG reviewed by Dr. Ermalene Postin, will proceed with surgery as scheduled.

## 2018-10-07 ENCOUNTER — Ambulatory Visit
Admission: RE | Admit: 2018-10-07 | Discharge: 2018-10-07 | Disposition: A | Discharge status: Home / Self Care | Payer: BC Managed Care – PPO | Source: Ambulatory Visit | Attending: Surgery | Admitting: Surgery

## 2018-10-07 ENCOUNTER — Ambulatory Visit: Payer: BC Managed Care – PPO

## 2018-10-07 ENCOUNTER — Other Ambulatory Visit: Payer: Self-pay | Admitting: Surgery

## 2018-10-07 DIAGNOSIS — C50912 Malignant neoplasm of unspecified site of left female breast: Secondary | ICD-10-CM

## 2018-10-10 ENCOUNTER — Ambulatory Visit
Admission: RE | Admit: 2018-10-10 | Discharge: 2018-10-10 | Disposition: A | Discharge status: Home / Self Care | Payer: BC Managed Care – PPO | Source: Ambulatory Visit | Attending: Surgery | Admitting: Surgery

## 2018-10-10 DIAGNOSIS — C50912 Malignant neoplasm of unspecified site of left female breast: Secondary | ICD-10-CM

## 2018-10-11 ENCOUNTER — Encounter (HOSPITAL_BASED_OUTPATIENT_CLINIC_OR_DEPARTMENT_OTHER)
Admission: RE | Disposition: A | Discharge status: Home / Self Care | Payer: Self-pay | Source: Home / Self Care | Attending: Surgery

## 2018-10-11 ENCOUNTER — Ambulatory Visit
Admission: RE | Admit: 2018-10-11 | Discharge: 2018-10-11 | Disposition: A | Discharge status: Home / Self Care | Payer: BC Managed Care – PPO | Source: Ambulatory Visit | Attending: Surgery | Admitting: Surgery

## 2018-10-11 ENCOUNTER — Ambulatory Visit (HOSPITAL_COMMUNITY)
Admission: RE | Admit: 2018-10-11 | Discharge: 2018-10-11 | Disposition: A | Discharge status: Home / Self Care | Payer: BC Managed Care – PPO | Source: Ambulatory Visit | Attending: Surgery | Admitting: Surgery

## 2018-10-11 ENCOUNTER — Encounter (HOSPITAL_BASED_OUTPATIENT_CLINIC_OR_DEPARTMENT_OTHER): Payer: Self-pay | Admitting: *Deleted

## 2018-10-11 ENCOUNTER — Ambulatory Visit (HOSPITAL_BASED_OUTPATIENT_CLINIC_OR_DEPARTMENT_OTHER)
Admission: RE | Admit: 2018-10-11 | Discharge: 2018-10-11 | Disposition: A | Discharge status: Home / Self Care | Payer: BC Managed Care – PPO | Attending: Surgery | Admitting: Surgery

## 2018-10-11 ENCOUNTER — Ambulatory Visit (HOSPITAL_BASED_OUTPATIENT_CLINIC_OR_DEPARTMENT_OTHER): Payer: BC Managed Care – PPO | Admitting: Certified Registered"

## 2018-10-11 DIAGNOSIS — C50512 Malignant neoplasm of lower-outer quadrant of left female breast: Secondary | ICD-10-CM | POA: Insufficient documentation

## 2018-10-11 DIAGNOSIS — I10 Essential (primary) hypertension: Secondary | ICD-10-CM | POA: Diagnosis not present

## 2018-10-11 DIAGNOSIS — C50912 Malignant neoplasm of unspecified site of left female breast: Secondary | ICD-10-CM

## 2018-10-11 DIAGNOSIS — Z170 Estrogen receptor positive status [ER+]: Secondary | ICD-10-CM | POA: Diagnosis not present

## 2018-10-11 DIAGNOSIS — E7800 Pure hypercholesterolemia, unspecified: Secondary | ICD-10-CM | POA: Insufficient documentation

## 2018-10-11 HISTORY — PX: BREAST LUMPECTOMY: SHX2

## 2018-10-11 HISTORY — PX: BREAST LUMPECTOMY WITH RADIOACTIVE SEED AND SENTINEL LYMPH NODE BIOPSY: SHX6550

## 2018-10-11 SURGERY — BREAST LUMPECTOMY WITH RADIOACTIVE SEED AND SENTINEL LYMPH NODE BIOPSY
Anesthesia: General | Site: Breast | Laterality: Left

## 2018-10-11 MED ORDER — FENTANYL CITRATE (PF) 100 MCG/2ML IJ SOLN
INTRAMUSCULAR | Status: AC
  Filled 2018-10-11: qty 2

## 2018-10-11 MED ORDER — FENTANYL CITRATE (PF) 100 MCG/2ML IJ SOLN
50.0000 ug | INTRAMUSCULAR | Status: DC | PRN
  Administered 2018-10-11: 50 ug via INTRAVENOUS

## 2018-10-11 MED ORDER — ONDANSETRON HCL 4 MG/2ML IJ SOLN: 4.0000 mg | Freq: Once | INTRAMUSCULAR | Status: DC | PRN

## 2018-10-11 MED ORDER — MIDAZOLAM HCL 2 MG/2ML IJ SOLN
INTRAMUSCULAR | Status: AC
  Filled 2018-10-11: qty 2

## 2018-10-11 MED ORDER — GABAPENTIN 300 MG PO CAPS
ORAL_CAPSULE | ORAL | Status: AC
  Filled 2018-10-11: qty 1

## 2018-10-11 MED ORDER — CHLORHEXIDINE GLUCONATE CLOTH 2 % EX PADS: 6.0000 | MEDICATED_PAD | Freq: Once | CUTANEOUS | Status: DC

## 2018-10-11 MED ORDER — ONDANSETRON HCL 4 MG/2ML IJ SOLN
INTRAMUSCULAR | Status: DC | PRN
  Administered 2018-10-11: 4 mg via INTRAVENOUS

## 2018-10-11 MED ORDER — SCOPOLAMINE 1 MG/3DAYS TD PT72: 1.0000 | MEDICATED_PATCH | Freq: Once | TRANSDERMAL | Status: DC | PRN

## 2018-10-11 MED ORDER — ACETAMINOPHEN 500 MG PO TABS
ORAL_TABLET | ORAL | Status: AC
  Filled 2018-10-11: qty 2

## 2018-10-11 MED ORDER — ACETAMINOPHEN 500 MG PO TABS
1000.0000 mg | ORAL_TABLET | ORAL | Status: AC
  Administered 2018-10-11: 1000 mg via ORAL

## 2018-10-11 MED ORDER — LIDOCAINE 2% (20 MG/ML) 5 ML SYRINGE
INTRAMUSCULAR | Status: AC
  Filled 2018-10-11: qty 5

## 2018-10-11 MED ORDER — CLINDAMYCIN PHOSPHATE 900 MG/50ML IV SOLN
900.0000 mg | INTRAVENOUS | Status: AC
  Administered 2018-10-11: 900 mg via INTRAVENOUS

## 2018-10-11 MED ORDER — MIDAZOLAM HCL 2 MG/2ML IJ SOLN
1.0000 mg | INTRAMUSCULAR | Status: DC | PRN
  Administered 2018-10-11: 2 mg via INTRAVENOUS

## 2018-10-11 MED ORDER — CLINDAMYCIN PHOSPHATE 900 MG/50ML IV SOLN
INTRAVENOUS | Status: AC
  Filled 2018-10-11: qty 50

## 2018-10-11 MED ORDER — PROPOFOL 10 MG/ML IV BOLUS
INTRAVENOUS | Status: AC
  Filled 2018-10-11: qty 20

## 2018-10-11 MED ORDER — LACTATED RINGERS IV SOLN
INTRAVENOUS | Status: DC
  Administered 2018-10-11 (×2): via INTRAVENOUS

## 2018-10-11 MED ORDER — BUPIVACAINE-EPINEPHRINE (PF) 0.25% -1:200000 IJ SOLN
INTRAMUSCULAR | Status: DC | PRN
  Administered 2018-10-11: 20 mL

## 2018-10-11 MED ORDER — METHYLENE BLUE 0.5 % INJ SOLN
INTRAVENOUS | Status: AC
  Filled 2018-10-11: qty 10

## 2018-10-11 MED ORDER — LIDOCAINE HCL (CARDIAC) PF 100 MG/5ML IV SOSY
PREFILLED_SYRINGE | INTRAVENOUS | Status: DC | PRN
  Administered 2018-10-11: 100 mg via INTRAVENOUS

## 2018-10-11 MED ORDER — ONDANSETRON HCL 4 MG/2ML IJ SOLN
INTRAMUSCULAR | Status: AC
  Filled 2018-10-11: qty 2

## 2018-10-11 MED ORDER — CLONIDINE HCL (ANALGESIA) 100 MCG/ML EP SOLN
EPIDURAL | Status: DC | PRN
  Administered 2018-10-11: 50 ug

## 2018-10-11 MED ORDER — TECHNETIUM TC 99M SULFUR COLLOID FILTERED
1.0000 | Freq: Once | INTRAVENOUS | Status: AC | PRN
  Administered 2018-10-11: 1 via INTRADERMAL

## 2018-10-11 MED ORDER — CELECOXIB 200 MG PO CAPS
ORAL_CAPSULE | ORAL | Status: AC
  Filled 2018-10-11: qty 2

## 2018-10-11 MED ORDER — PROPOFOL 10 MG/ML IV BOLUS
INTRAVENOUS | Status: DC | PRN
  Administered 2018-10-11: 150 mg via INTRAVENOUS

## 2018-10-11 MED ORDER — FENTANYL CITRATE (PF) 100 MCG/2ML IJ SOLN: 25.0000 ug | INTRAMUSCULAR | Status: DC | PRN

## 2018-10-11 MED ORDER — SODIUM CHLORIDE (PF) 0.9 % IJ SOLN
INTRAMUSCULAR | Status: AC
  Filled 2018-10-11: qty 10

## 2018-10-11 MED ORDER — BUPIVACAINE-EPINEPHRINE 0.25% -1:200000 IJ SOLN
INTRAMUSCULAR | Status: AC
  Filled 2018-10-11: qty 1

## 2018-10-11 MED ORDER — GLYCOPYRROLATE 0.2 MG/ML IJ SOLN
INTRAMUSCULAR | Status: DC | PRN
  Administered 2018-10-11: 0.2 mg via INTRAVENOUS

## 2018-10-11 MED ORDER — GABAPENTIN 300 MG PO CAPS
300.0000 mg | ORAL_CAPSULE | ORAL | Status: AC
  Administered 2018-10-11: 300 mg via ORAL

## 2018-10-11 MED ORDER — DEXAMETHASONE SODIUM PHOSPHATE 10 MG/ML IJ SOLN
INTRAMUSCULAR | Status: DC | PRN
  Administered 2018-10-11: 10 mg via INTRAVENOUS

## 2018-10-11 MED ORDER — ROCURONIUM BROMIDE 50 MG/5ML IV SOSY
PREFILLED_SYRINGE | INTRAVENOUS | Status: AC
  Filled 2018-10-11: qty 5

## 2018-10-11 MED ORDER — HYDROCODONE-ACETAMINOPHEN 5-325 MG PO TABS: 1.0000 | ORAL_TABLET | Freq: Four times a day (QID) | ORAL | 0 refills | Status: DC | PRN

## 2018-10-11 MED ORDER — DEXAMETHASONE SODIUM PHOSPHATE 10 MG/ML IJ SOLN
INTRAMUSCULAR | Status: AC
  Filled 2018-10-11: qty 1

## 2018-10-11 MED ORDER — SUGAMMADEX SODIUM 500 MG/5ML IV SOLN
INTRAVENOUS | Status: AC
  Filled 2018-10-11: qty 5

## 2018-10-11 MED ORDER — EPHEDRINE SULFATE 50 MG/ML IJ SOLN
INTRAMUSCULAR | Status: DC | PRN
  Administered 2018-10-11 (×3): 10 mg via INTRAVENOUS
  Administered 2018-10-11: 15 mg via INTRAVENOUS
  Administered 2018-10-11 (×2): 10 mg via INTRAVENOUS
  Administered 2018-10-11: 15 mg via INTRAVENOUS
  Administered 2018-10-11 (×2): 10 mg via INTRAVENOUS

## 2018-10-11 MED ORDER — PHENYLEPHRINE HCL 10 MG/ML IJ SOLN
INTRAMUSCULAR | Status: DC | PRN
  Administered 2018-10-11: 120 ug via INTRAVENOUS
  Administered 2018-10-11: 200 ug via INTRAVENOUS
  Administered 2018-10-11: 120 ug via INTRAVENOUS
  Administered 2018-10-11: 40 ug via INTRAVENOUS
  Administered 2018-10-11: 80 ug via INTRAVENOUS
  Administered 2018-10-11 (×2): 120 ug via INTRAVENOUS

## 2018-10-11 MED ORDER — BUPIVACAINE-EPINEPHRINE (PF) 0.5% -1:200000 IJ SOLN
INTRAMUSCULAR | Status: DC | PRN
  Administered 2018-10-11: 30 mL via PERINEURAL

## 2018-10-11 MED ORDER — PROPOFOL 10 MG/ML IV BOLUS
INTRAVENOUS | Status: AC
  Filled 2018-10-11: qty 40

## 2018-10-11 MED ORDER — CELECOXIB 400 MG PO CAPS
400.0000 mg | ORAL_CAPSULE | ORAL | Status: AC
  Administered 2018-10-11: 200 mg via ORAL

## 2018-10-11 SURGICAL SUPPLY — 51 items
APPLIER CLIP 9.375 MED OPEN (MISCELLANEOUS) ×3
BINDER BREAST 3XL (GAUZE/BANDAGES/DRESSINGS) ×2 IMPLANT
BINDER BREAST LRG (GAUZE/BANDAGES/DRESSINGS) IMPLANT
BINDER BREAST MEDIUM (GAUZE/BANDAGES/DRESSINGS) IMPLANT
BINDER BREAST XLRG (GAUZE/BANDAGES/DRESSINGS) IMPLANT
BINDER BREAST XXLRG (GAUZE/BANDAGES/DRESSINGS) IMPLANT
BLADE SURG 15 STRL LF DISP TIS (BLADE) ×1 IMPLANT
BLADE SURG 15 STRL SS (BLADE) ×4
CANISTER SUC SOCK COL 7IN (MISCELLANEOUS) IMPLANT
CANISTER SUCT 1200ML W/VALVE (MISCELLANEOUS) ×3 IMPLANT
CHLORAPREP W/TINT 26ML (MISCELLANEOUS) ×3 IMPLANT
CLIP APPLIE 9.375 MED OPEN (MISCELLANEOUS) ×1 IMPLANT
COVER BACK TABLE 60X90IN (DRAPES) ×3 IMPLANT
COVER MAYO STAND STRL (DRAPES) ×3 IMPLANT
COVER PROBE W GEL 5X96 (DRAPES) ×3 IMPLANT
COVER WAND RF STERILE (DRAPES) IMPLANT
DECANTER SPIKE VIAL GLASS SM (MISCELLANEOUS) IMPLANT
DERMABOND ADVANCED (GAUZE/BANDAGES/DRESSINGS) ×2
DERMABOND ADVANCED .7 DNX12 (GAUZE/BANDAGES/DRESSINGS) ×1 IMPLANT
DRAPE LAPAROSCOPIC ABDOMINAL (DRAPES) ×3 IMPLANT
DRAPE UTILITY XL STRL (DRAPES) ×3 IMPLANT
ELECT COATED BLADE 2.86 ST (ELECTRODE) ×3 IMPLANT
ELECT REM PT RETURN 9FT ADLT (ELECTROSURGICAL) ×3
ELECTRODE REM PT RTRN 9FT ADLT (ELECTROSURGICAL) ×1 IMPLANT
GLOVE BIO SURGEON STRL SZ 6.5 (GLOVE) ×2 IMPLANT
GLOVE BIO SURGEONS STRL SZ 6.5 (GLOVE) ×2
GLOVE BIOGEL PI IND STRL 8 (GLOVE) ×1 IMPLANT
GLOVE BIOGEL PI INDICATOR 8 (GLOVE) ×2
GLOVE ECLIPSE 8.0 STRL XLNG CF (GLOVE) ×3 IMPLANT
GOWN STRL REUS W/ TWL LRG LVL3 (GOWN DISPOSABLE) ×2 IMPLANT
GOWN STRL REUS W/TWL LRG LVL3 (GOWN DISPOSABLE) ×6
HEMOSTAT ARISTA ABSORB 3G PWDR (HEMOSTASIS) ×2 IMPLANT
HEMOSTAT SNOW SURGICEL 2X4 (HEMOSTASIS) IMPLANT
KIT MARKER MARGIN INK (KITS) ×3 IMPLANT
NDL HYPO 25X1 1.5 SAFETY (NEEDLE) ×1 IMPLANT
NDL SAFETY ECLIPSE 18X1.5 (NEEDLE) IMPLANT
NEEDLE HYPO 18GX1.5 SHARP (NEEDLE)
NEEDLE HYPO 25X1 1.5 SAFETY (NEEDLE) ×3 IMPLANT
NS IRRIG 1000ML POUR BTL (IV SOLUTION) ×3 IMPLANT
PACK BASIN DAY SURGERY FS (CUSTOM PROCEDURE TRAY) ×3 IMPLANT
PENCIL BUTTON HOLSTER BLD 10FT (ELECTRODE) ×3 IMPLANT
SLEEVE SCD COMPRESS KNEE MED (MISCELLANEOUS) ×3 IMPLANT
SPONGE LAP 4X18 RFD (DISPOSABLE) ×3 IMPLANT
SUT MNCRL AB 4-0 PS2 18 (SUTURE) ×5 IMPLANT
SUT VICRYL 3-0 CR8 SH (SUTURE) ×5 IMPLANT
SYR CONTROL 10ML LL (SYRINGE) ×3 IMPLANT
TOWEL GREEN STERILE FF (TOWEL DISPOSABLE) ×3 IMPLANT
TRAY FAXITRON CT DISP (TRAY / TRAY PROCEDURE) ×3 IMPLANT
TUBE CONNECTING 20'X1/4 (TUBING) ×1
TUBE CONNECTING 20X1/4 (TUBING) ×2 IMPLANT
YANKAUER SUCT BULB TIP NO VENT (SUCTIONS) ×3 IMPLANT

## 2018-10-11 NOTE — Discharge Instructions (Signed)
[place] Office Phone Number [phone]  BREAST BIOPSY/ PARTIAL MASTECTOMY: POST OP INSTRUCTIONS  Always review your discharge instruction sheet given to you by the facility where your surgery was performed.  IF YOU HAVE DISABILITY OR FAMILY LEAVE FORMS, YOU MUST BRING THEM TO THE OFFICE FOR PROCESSING.  DO NOT GIVE THEM TO YOUR DOCTOR.  1. A prescription for pain medication may be given to you upon discharge.  Take your pain medication as prescribed, if needed.  If narcotic pain medicine is not needed, then you may take acetaminophen (Tylenol) or ibuprofen (Advil) as needed. 2. Take your usually prescribed medications unless otherwise directed 3. If you need a refill on your pain medication, please contact your pharmacy.  They will contact our office to request authorization.  Prescriptions will not be filled after 5pm or on week-ends. 4. You should eat very light the first 24 hours after surgery, such as soup, crackers, pudding, etc.  Resume your normal diet the day after surgery. 5. Most patients will experience some swelling and bruising in the breast.  Ice packs and a good support bra will help.  Swelling and bruising can take several days to resolve.  6. It is common to experience some constipation if taking pain medication after surgery.  Increasing fluid intake and taking a stool softener will usually help or prevent this problem from occurring.  A mild laxative (Milk of Magnesia or Miralax) should be taken according to package directions if there are no bowel movements after 48 hours. 7. Unless discharge instructions indicate otherwise, you may remove your bandages 24-48 hours after surgery, and you may shower at that time.  You may have steri-strips (small skin tapes) in place directly over the incision.  These strips should be left on the skin for 7-10 days.  If your surgeon used skin glue on the incision, you may shower in 24 hours.  The glue will flake off over the  next 2-3 weeks.  Any sutures or staples will be removed at the office during your follow-up visit. 8. ACTIVITIES:  You may resume regular daily activities (gradually increasing) beginning the next day.  Wearing a good support bra or sports bra minimizes pain and swelling.  You may have sexual intercourse when it is comfortable. a. You may drive when you no longer are taking prescription pain medication, you can comfortably wear a seatbelt, and you can safely maneuver your car and apply brakes. b. RETURN TO WORK:  ______________________________________________________________________________________ 9. You should see your doctor in the office for a follow-up appointment approximately two weeks after your surgery.  Your doctors nurse will typically make your follow-up appointment when she calls you with your pathology report.  Expect your pathology report 2-3 business days after your surgery.  You may call to check if you do not hear from [place] after three days. 10. OTHER INSTRUCTIONS: _______________________________________________________________________________________________ _____________________________________________________________________________________________________________________________________ _____________________________________________________________________________________________________________________________________ _____________________________________________________________________________________________________________________________________  WHEN TO CALL YOUR DOCTOR: 1. Fever over 101.0 2. Nausea and/or vomiting. 3. Extreme swelling or bruising. 4. Continued bleeding from incision. 5. Increased pain, redness, or drainage from the incision.  The clinic staff is available to answer your questions during regular business hours.  Please dont hesitate to call and ask to speak to one of the nurses for clinical concerns.  If you have a medical emergency, go to the nearest  emergency room or call 911.  A surgeon from [facility] is always on call at the hospital.  For further questions, please visit [email or web address]    *  No Tylenol, Ibuprofen/Motrin until after 1:30pm  Post Anesthesia Home Care Instructions  Activity: Get plenty of rest for the remainder of the day. A responsible individual must stay with you for 24 hours following the procedure.  For the next 24 hours, DO NOT: -Drive a car -Paediatric nurse -Drink alcoholic beverages -Take any medication unless instructed by your physician -Make any legal decisions or sign important papers.  Meals: Start with liquid foods such as gelatin or soup. Progress to regular foods as tolerated. Avoid greasy, spicy, heavy foods. If nausea and/or vomiting occur, drink only clear liquids until the nausea and/or vomiting subsides. Call your physician if vomiting continues.  Special Instructions/Symptoms: Your throat may feel dry or sore from the anesthesia or the breathing tube placed in your throat during surgery. If this causes discomfort, gargle with warm salt water. The discomfort should disappear within 24 hours.  If you had a scopolamine patch placed behind your ear for the management of post- operative nausea and/or vomiting:  1. The medication in the patch is effective for 72 hours, after which it should be removed.  Wrap patch in a tissue and discard in the trash. Wash hands thoroughly with soap and water. 2. You may remove the patch earlier than 72 hours if you experience unpleasant side effects which may include dry mouth, dizziness or visual disturbances. 3. Avoid touching the patch. Wash your hands with soap and water after contact with the patch.      Regional Anesthesia Blocks  1. Numbness or the inability to move the "blocked" extremity may last from 3-48 hours after placement. The length of time depends on the medication injected and your individual response to the  medication. If the numbness is not going away after 48 hours, call your surgeon.  2. The extremity that is blocked will need to be protected until the numbness is gone and the  Strength has returned. Because you cannot feel it, you will need to take extra care to avoid injury. Because it may be weak, you may have difficulty moving it or using it. You may not know what position it is in without looking at it while the block is in effect.  3. For blocks in the legs and feet, returning to weight bearing and walking needs to be done carefully. You will need to wait until the numbness is entirely gone and the strength has returned. You should be able to move your leg and foot normally before you try and bear weight or walk. You will need someone to be with you when you first try to ensure you do not fall and possibly risk injury.  4. Bruising and tenderness at the needle site are common side effects and will resolve in a few days.  5. Persistent numbness or new problems with movement should be communicated to the surgeon or the [place] [phone] [place] [phone]).

## 2018-10-11 NOTE — Op Note (Signed)
Preoperative diagnosis: Stage II left breast cancer lower outer quadrant  Postoperative diagnosis: Same  Procedure: Left breast seed localized lumpectomy with left axillary targeted seed localized lymph node biopsy of deep left axillary lymph node and left axillary sentinel lymph node mapping with technetium sulfur colloid  Surgeon: Erroll Luna, MD  Anesthesia: LMA with pectoral block and 20 cc of 0.25% Sensorcaine local plain  EBL: 10 cc  Specimen: Left breast tissue with seed and clip verified by Faxitron.  Anterior margin is skin.  Left axillary node with seed and positive by technetium sulfur colloid scanning sent as targeted node and additional sentinel node sent separately  Drains: None  IV fluids: Per anesthesia record  Indications for procedure: The patient is a 63 year old female found to have stage II left breast cancer.  She opted of breast conservation surgery and targeted lymph node dissection after being seen in the multidisciplinary clinic.  Other options of surgery as well as treatment options were discussed in the clinic with the patient.  Risks benefits and long-term expectations of the above were discussed.The procedure has been discussed with the patient. Alternatives to surgery have been discussed with the patient.  Risks of surgery include bleeding,  Infection,  Seroma formation, death,  and the need for further surgery.   The patient understands and wishes to proceed.Sentinel lymph node mapping and dissection has been discussed with the patient.  Risk of bleeding,  Infection,  Seroma formation,  Additional procedures,,  Shoulder weakness ,  Shoulder stiffness,  Nerve and blood vessel injury and reaction to the mapping dyes have been discussed.  Alternatives to surgery have been discussed with the patient.  The patient agrees to proceed.   Description of procedure: The patient was met in the holding area.  She underwent pectoral block by anesthesia and injection of  technetium sulfur colloid by radiology.  Left side was marked as the correct side and neoprobe was used to localize both seeds.  Questions were answered.  She is taken back the operating room placed supine.  After induction of LMA anesthesia the left breast was prepped and draped in a sterile fashion.  Timeout was done to verify proper location and patient and procedure.  The neoprobe was used and this was set on iodine settings.  Left axilla was examined and the hotspot was identified.  Incision was made along the inferior border of the left axillary hairline.  Dissection was carried down through the subcutaneous fatty tissue into the left axilla.  The node was identified with the neoprobe was removed.  This was a deep level 1 node.  Faxitron revealed the seed and hydro-clip to be present.  There is an additional sentinel node that mapped out with technetium and this was removed.  Of note the seed node was hot by technetium.  Background counts then approached 0.  The long thoracic nerve, thoracodorsal trunk and axillary vein were all preserved.  Arista applied for hemostasis.  The wound was irrigated and closed with 3-0 Vicryl and 4-0 Monocryl.  The lumpectomy was done next.  With the neoprobe set to iodine the seed was localized in the left lower outer quadrant just adjacent to the nipple areolar complex.  Curvilinear incision was made.  Dissection was carried down and all tissue around the seed and clip were excised.  The anterior margin was skin therefore there is no further anterior margin I can remove.  Grossly the margins appeared negative.  This is approximately 3 cm from the edge  of the nipple areolar complex.  The cavity was made hemostatic.  Faxitron revealed the seed and clip to be in the specimen.  Background counts approached 0.  We then closed the cavity with 3-0 Vicryl and 4-0 Monocryl.  Dermabond applied to both incisions.  All counts were found to be correct.  Breast binder was placed.  The  patient was awoke extubated taken recovery in satisfactory condition.

## 2018-10-11 NOTE — Progress Notes (Signed)
Assisted Dr. Turk with left, ultrasound guided, pectoralis block. Side rails up, monitors on throughout procedure. See vital signs in flow sheet. Tolerated Procedure well. 

## 2018-10-11 NOTE — H&P (Signed)
Kayla Banks  Location: [facility] Patient #: [id number] DOB: [birth year] Undefined / Language: [name] / Race: Black or African American Female  History of Present Illness  Patient words: Pt seen at [facility] at request of Dr Lisbeth Renshaw FOR SD MAMMOGRAPHIC ABNORMALITY LEFT UPPER OUTER QUADRANT   6 mm mass IDC 2 ER POS ER POS HER 2 NEU NEGATIVE  POS LN BX  NO HISTORY OF MAS OR PAIN NO NIPPLE DISCHARGE.  The patient is a 63 year old female.   Past Surgical History  No pertinent past surgical history  Diagnostic Studies History  Colonoscopy 1-5 years ago  Medication History  Medications Reconciled  Social History  Alcohol use Occasional alcohol use. Caffeine use Carbonated beverages, Coffee. No drug use Tobacco use Never smoker.  Other Problems  Arthritis Back Pain Heart murmur Hemorrhoids High blood pressure Hypercholesterolemia Lump In Breast Thyroid Disease     Review of Systems  General Not Present- Appetite Loss, Chills, Fatigue, Fever, Night Sweats, Weight Gain and Weight Loss. Skin Not Present- Change in Wart/Mole, Dryness, Hives, Jaundice, New Lesions, Non-Healing Wounds, Rash and Ulcer. HEENT Not Present- Earache, Hearing Loss, Hoarseness, Nose Bleed, Oral Ulcers, Ringing in the Ears, Seasonal Allergies, Sinus Pain, Sore Throat, Visual Disturbances, Wears glasses/contact lenses and Yellow Eyes. Respiratory Not Present- Bloody sputum, Chronic Cough, Difficulty Breathing, Snoring and Wheezing. Breast Not Present- Breast Mass, Breast Pain, Nipple Discharge and Skin Changes. Cardiovascular Not Present- Chest Pain, Difficulty Breathing Lying Down, Leg Cramps, Palpitations, Rapid Heart Rate, Shortness of Breath and Swelling of Extremities. Gastrointestinal Present- Bloating. Not Present- Abdominal Pain, Bloody Stool, Change in Bowel Habits, Chronic diarrhea, Constipation, Difficulty Swallowing, Excessive gas, Gets full quickly  at meals, Hemorrhoids, Indigestion, Nausea, Rectal Pain and Vomiting. Female Genitourinary Not Present- Frequency, Nocturia, Painful Urination, Pelvic Pain and Urgency. Musculoskeletal Present- Back Pain. Not Present- Joint Pain, Joint Stiffness, Muscle Pain, Muscle Weakness and Swelling of Extremities. Neurological Not Present- Decreased Memory, Fainting, Headaches, Numbness, Seizures, Tingling, Tremor, Trouble walking and Weakness. Psychiatric Not Present- Anxiety, Bipolar, Change in Sleep Pattern, Depression, Fearful and Frequent crying. Endocrine Not Present- Cold Intolerance, Excessive Hunger, Hair Changes, Heat Intolerance, Hot flashes and New Diabetes. Hematology Not Present- Blood Thinners, Easy Bruising, Excessive bleeding, Gland problems, HIV and Persistent Infections. All other systems negative   Physical Exam   General Mental Status-Alert. General Appearance-Consistent with stated age. Hydration-Well hydrated. Voice-Normal.  Head and Neck Head-normocephalic, atraumatic with no lesions or palpable masses. Trachea-midline. Thyroid Gland Characteristics - normal size and consistency.  Chest and Lung Exam Chest and lung exam reveals -quiet, even and easy respiratory effort with no use of accessory muscles and on auscultation, normal breath sounds, no adventitious sounds and normal vocal resonance. Inspection Chest Wall - Normal. Back - normal.  Breast Breast - Left-Symmetric, Non Tender, No Biopsy scars, no Dimpling, No Inflammation, No Lumpectomy scars, No Mastectomy scars, No Peau d' Orange. Breast - Right-Symmetric, Non Tender, No Biopsy scars, no Dimpling, No Inflammation, No Lumpectomy scars, No Mastectomy scars, No Peau d' Orange. Breast Lump-No Palpable Breast Mass. Note: LEFT BRUISING  Musculoskeletal Normal Exam - Left-Upper Extremity Strength Normal and Lower Extremity Strength Normal. Normal Exam - Right-Upper Extremity Strength  Normal and Lower Extremity Strength Normal.  Lymphatic Head & Neck  General Head & Neck Lymphatics: Bilateral - Description - Normal. Axillary  General Axillary Region: Bilateral - Description - Normal. Tenderness - Non Tender. Femoral & Inguinal  Generalized Femoral & Inguinal Lymphatics: Bilateral - Description - Normal. Tenderness -  Non Tender.    Assessment & Plan   BREAST CANCER, LEFT (C50.912) Impression: DISCUSSED LEFT LUMPECTOMY AND SLN TARGETED LN DISSECTION Risk of lumpectomy include bleeding, infection, seroma, more surgery, use of seed/wire, wound care, cosmetic deformity and the need for other treatments, death , blood clots, death. Pt agrees to proceed. Risk of sentinel lymph node mapping include bleeding, infection, lymphedema, shoulder pain. stiffness, dye allergy. cosmetic deformity , blood clots, death, need for more surgery. Pt agres to proceed.  Current Plans You are being scheduled for surgery- Our schedulers will call you.  You should hear from our office's scheduling department within 5 working days about the location, date, and time of surgery. We try to make accommodations for patient's preferences in scheduling surgery, but sometimes the OR schedule or the surgeon's schedule prevents [place] from making those accommodations.  If you have not heard from our office [phone]) in 5 working days, call the office and ask for your surgeon's nurse.  If you have other questions about your diagnosis, plan, or surgery, call the office and ask for your surgeon's nurse.  Pt Education - [facility] Breast Cancer Information Given - [facility] "Breast Journey" Package We discussed the staging and pathophysiology of breast cancer. We discussed all of the different options for treatment for breast cancer including surgery, chemotherapy, radiation therapy, Herceptin, and antiestrogen therapy. We discussed a sentinel lymph node biopsy as she does not appear to having lymph  node involvement right now. We discussed the performance of that with injection of radioactive tracer and blue dye. We discussed that she would have an incision underneath her axillary hairline. We discussed that there is a bout a 10-20% chance of having a positive node with a sentinel lymph node biopsy and we will await the permanent pathology to make any other first further decisions in terms of her treatment. One of these options might be to return to the operating room to perform an axillary lymph node dissection. We discussed about a 1-2% risk lifetime of chronic shoulder pain as well as lymphedema associated with a sentinel lymph node biopsy. We discussed the options for treatment of the breast cancer which included lumpectomy versus a mastectomy. We discussed the performance of the lumpectomy with a wire placement. We discussed a 10-20% chance of a positive margin requiring reexcision in the operating room. We also discussed that she may need radiation therapy or antiestrogen therapy or both if she undergoes lumpectomy. We discussed the mastectomy and the postoperative care for that as well. We discussed that there is no difference in her survival whether she undergoes lumpectomy with radiation therapy or antiestrogen therapy versus a mastectomy. There is a slight difference in the local recurrence rate being 3-5% with lumpectomy and about 1% with a mastectomy. We discussed the risks of operation including bleeding, infection, possible reoperation. She understands her further therapy will be based on what her stages at the time of her operation.  Pt Education - [facility] Breast Biopsy HCI: discussed with patient and provided information. Pt Education - [facility] breast cancer surgery: discussed with patient and provided information. Pt Education - ABC (After Breast Cancer) Class Info: discussed with patient and provided information.

## 2018-10-11 NOTE — Interval H&P Note (Signed)
History and Physical Interval Note:  10/11/2018 7:19 AM  Kayla Banks  has presented today for surgery, with the diagnosis of LEFT BREAST CANCER  The various methods of treatment have been discussed with the patient and family. After consideration of risks, benefits and other options for treatment, the patient has consented to  Procedure(s): LEFT BREAST LUMPECTOMY WITH RADIOACTIVE SEED AND LEFT SENTINEL LYMPH NODE MAPPING WITH LEFT TARGETED LYMPH NODE BIOPSY (Left) as a surgical intervention .  The patient's history has been reviewed, patient examined, no change in status, stable for surgery.  I have reviewed the patient's chart and labs.  Questions were answered to the patient's satisfaction.     [place]

## 2018-10-11 NOTE — Anesthesia Preprocedure Evaluation (Signed)
Anesthesia Evaluation  Patient identified by MRN, date of birth, ID band Patient awake    Reviewed: Allergy & Precautions, NPO status , Patient's Chart, lab work & pertinent test results, reviewed documented beta blocker date and time   Airway Mallampati: II  TM Distance: >3 FB Neck ROM: Full    Dental  (+) Teeth Intact, Dental Advisory Given   Pulmonary neg pulmonary ROS,    Pulmonary exam normal breath sounds clear to auscultation       Cardiovascular hypertension, Pt. on medications and Pt. on home beta blockers Normal cardiovascular exam Rhythm:Regular Rate:Normal     Neuro/Psych negative neurological ROS  negative psych ROS   GI/Hepatic negative GI ROS, Neg liver ROS,   Endo/Other  Hypothyroidism Morbid obesity  Renal/GU negative Renal ROS     Musculoskeletal  (+) Arthritis ,   Abdominal   Peds  Hematology negative hematology ROS (+)   Anesthesia Other Findings Day of surgery medications reviewed with the patient.  Reproductive/Obstetrics                             Anesthesia Physical Anesthesia Plan  ASA: III  Anesthesia Plan: General   Post-op Pain Management:    Induction: Intravenous  PONV Risk Score and Plan: 3 and Midazolam, Dexamethasone and Ondansetron  Airway Management Planned: LMA  Additional Equipment:   Intra-op Plan:   Post-operative Plan: Extubation in OR  Informed Consent: I have reviewed the patients History and Physical, chart, labs and discussed the procedure including the risks, benefits and alternatives for the proposed anesthesia with the patient or authorized representative who has indicated his/her understanding and acceptance.     Dental advisory given  Plan Discussed with: CRNA  Anesthesia Plan Comments:         Anesthesia Quick Evaluation

## 2018-10-11 NOTE — Anesthesia Postprocedure Evaluation (Signed)
Anesthesia Post Note  Patient: Production manager  Procedure(s) Performed: LEFT BREAST LUMPECTOMY WITH RADIOACTIVE SEED AND LEFT SENTINEL LYMPH NODE MAPPING WITH LEFT TARGETED LYMPH NODE BIOPSY (Left Breast)     Patient location during evaluation: PACU Anesthesia Type: General Level of consciousness: awake and alert, awake and oriented Pain management: pain level controlled Vital Signs Assessment: post-procedure vital signs reviewed and stable Respiratory status: spontaneous breathing, nonlabored ventilation and respiratory function stable Cardiovascular status: blood pressure returned to baseline and stable Postop Assessment: no apparent nausea or vomiting Anesthetic complications: no    Last Vitals:  Vitals:   10/11/18 0945 10/11/18 1010  BP: 128/65 127/71  Pulse: 72 73  Resp: 12 16  Temp:  36.6 C  SpO2: 100% 98%    Last Pain:  Vitals:   10/11/18 1010  TempSrc:   PainSc: 0-No pain                 Catalina Gravel

## 2018-10-11 NOTE — Anesthesia Procedure Notes (Signed)
Anesthesia Regional Block: Pectoralis block   Pre-Anesthetic Checklist: ,, timeout performed, Correct Patient, Correct Site, Correct Laterality, Correct Procedure, Correct Position, site marked, Risks and benefits discussed,  Surgical consent,  Pre-op evaluation,  At surgeon's request and post-op pain management  Laterality: Left  Prep: chloraprep       Needles:  Injection technique: Single-shot  Needle Type: Echogenic Needle     Needle Length: 9cm  Needle Gauge: 21     Additional Needles:   Procedures:,,,, ultrasound used (permanent image in chart),,,,  Narrative:  Start time: 10/11/2018 7:26 AM End time: 10/11/2018 7:31 AM Injection made incrementally with aspirations every 5 mL.  Performed by: Personally  Anesthesiologist: Catalina Gravel, MD  Additional Notes: No pain on injection. No increased resistance to injection. Injection made in 5cc increments.  Good needle visualization.  Patient tolerated procedure well.

## 2018-10-11 NOTE — Transfer of Care (Signed)
Immediate Anesthesia Transfer of Care Note  Patient: Production manager  Procedure(s) Performed: LEFT BREAST LUMPECTOMY WITH RADIOACTIVE SEED AND LEFT SENTINEL LYMPH NODE MAPPING WITH LEFT TARGETED LYMPH NODE BIOPSY (Left Breast)  Patient Location: PACU  Anesthesia Type:General  Level of Consciousness: awake, alert  and oriented  Airway & Oxygen Therapy: Patient Spontanous Breathing and Patient connected to face mask oxygen  Post-op Assessment: Report given to RN and Post -op Vital signs reviewed and stable  Post vital signs: Reviewed and stable  Last Vitals:  Vitals Value Taken Time  BP    Temp    Pulse 74 10/11/2018  9:04 AM  Resp 29 10/11/2018  9:04 AM  SpO2 100 % 10/11/2018  9:04 AM  Vitals shown include unvalidated device data.  Last Pain:  Vitals:   10/11/18 0718  TempSrc: Oral  PainSc: 3       Patients Stated Pain Goal: 3 (06/02/50 0258)  Complications: No apparent anesthesia complications

## 2018-10-12 ENCOUNTER — Encounter (HOSPITAL_BASED_OUTPATIENT_CLINIC_OR_DEPARTMENT_OTHER): Payer: Self-pay | Admitting: Surgery

## 2018-10-18 ENCOUNTER — Inpatient Hospital Stay: Payer: BC Managed Care – PPO | Attending: Hematology and Oncology | Admitting: Hematology and Oncology

## 2018-10-18 DIAGNOSIS — Z791 Long term (current) use of non-steroidal anti-inflammatories (NSAID): Secondary | ICD-10-CM | POA: Insufficient documentation

## 2018-10-18 DIAGNOSIS — C773 Secondary and unspecified malignant neoplasm of axilla and upper limb lymph nodes: Secondary | ICD-10-CM | POA: Diagnosis not present

## 2018-10-18 DIAGNOSIS — C50412 Malignant neoplasm of upper-outer quadrant of left female breast: Secondary | ICD-10-CM

## 2018-10-18 DIAGNOSIS — Z170 Estrogen receptor positive status [ER+]: Secondary | ICD-10-CM | POA: Diagnosis not present

## 2018-10-18 DIAGNOSIS — Z79899 Other long term (current) drug therapy: Secondary | ICD-10-CM | POA: Insufficient documentation

## 2018-10-18 NOTE — Progress Notes (Signed)
Patient Care Team: Carollee Herter, Alferd Apa, DO as PCP - General Dorna Leitz, MD as Consulting Physician (Orthopedic Surgery) Erroll Luna, MD as Consulting Physician (General Surgery) Nicholas Lose, MD as Consulting Physician (Hematology and Oncology) Kyung Rudd, MD as Consulting Physician (Radiation Oncology)  DIAGNOSIS:    ICD-10-CM   1. Malignant neoplasm of upper-outer quadrant of left breast in female, estrogen receptor positive ([place]) C50.412    Z17.0     SUMMARY OF ONCOLOGIC HISTORY:   Malignant neoplasm of upper-outer quadrant of left breast in female, estrogen receptor positive ([place])   08/24/2018 Initial Biopsy    Screening detected left breast mass at 2:30 position 6 mm in size with one abnormal lymph node and stable benign calcifications, biopsy of the mass grade 2 IDC with DCIS and lymphovascular invasion, lymph node biopsy is positive, ER 95%, PR 40%, Ki-67 20%, HER-2 -1+ by IHC, T1b N1 stage Ib    08/31/2018 Cancer Staging    Staging form: Breast, AJCC 8th Edition - Clinical: Stage IB (cT1b, cN1(f), cM0, G2, ER+, PR+, HER2-) - Signed by Nicholas Lose, MD on 08/31/2018    10/11/2018 Surgery    Left lumpectomy: Grade 2 IDC with DCIS, 1 cm, margins negative, DCIS focally 0.1 cm from superior margin, 2/2 lymph nodes positive, ER 95%, PR 45%, HER-2 negative, Ki-67 20%    10/18/2018 Cancer Staging    Staging form: Breast, AJCC 8th Edition - Pathologic stage from 10/18/2018: Stage IA (pT1b, pN1a, cM0, G2, ER+, PR+, HER2-) - Signed by Nicholas Lose, MD on 10/18/2018     CHIEF COMPLIANT: Follow-up s/p lumpectomy to review pathology and discuss further treatment  INTERVAL HISTORY: Kayla Banks is a 63 y.o. with above-mentioned history of left breast cancer treated with lumpectomy on 10/11/18, for which pathology confirmed 1cm grade 2 IDC with DCIS, ER 95%, PR 45%, HER2 negative, Ki67 20%, with the involvement of two axillary lymph nodes. Genetic testing was found to be negative.  She presents to the clinic today with her mother and is recovering well from surgery. She notes pain and soreness at the surgical site.   REVIEW OF SYSTEMS:   Constitutional: Denies fevers, chills or abnormal weight loss Eyes: Denies blurriness of vision Ears, nose, mouth, throat, and face: Denies mucositis or sore throat Respiratory: Denies cough, dyspnea or wheezes Cardiovascular: Denies palpitation, chest discomfort Gastrointestinal: Denies nausea, heartburn or change in bowel habits Skin: Denies abnormal skin rashes Lymphatics: Denies new lymphadenopathy or easy bruising Neurological: Denies numbness, tingling or new weaknesses Behavioral/Psych: Mood is stable, no new changes  Extremities: No lower extremity edema Breast: denies any pain or lumps or nodules in either breasts All other systems were reviewed with the patient and are negative.  I have reviewed the past medical history, past surgical history, social history and family history with the patient and they are unchanged from previous note.  ALLERGIES:  is allergic to erythromycin; lisinopril; and penicillins.  MEDICATIONS:  Current Outpatient Medications  Medication Sig Dispense Refill  . amLODipine (NORVASC) 5 MG tablet Take 1 tablet (5 mg total) by mouth daily. 90 tablet 1  . carvedilol (COREG) 25 MG tablet TAKE 1 TABLET(25 MG) BY MOUTH TWICE DAILY WITH A MEAL 180 tablet 1  . doxycycline (VIBRA-TABS) 100 MG tablet Take 1 tablet (100 mg total) by mouth 2 (two) times daily. 20 tablet 0  . EPINEPHRINE 0.3 mg/0.3 mL IJ SOAJ injection INJECT 0.3 ML(1 SYRINGE) IN THE MUSCLE AS NEEDED FOR ALLERGIC REACTION 2 Device 0  .  fluticasone (FLONASE) 50 MCG/ACT nasal spray Place 2 sprays into both nostrils daily. 16 g 6  . furosemide (LASIX) 20 MG tablet TAKE 1 TABLET(20 MG) BY MOUTH DAILY 90 tablet 1  . HYDROcodone-acetaminophen (NORCO/VICODIN) 5-325 MG tablet Take 1 tablet by mouth every 6 (six) hours as needed for moderate pain. 12  tablet 0  . meloxicam (MOBIC) 15 MG tablet TAKE 1 TABLET(15 MG) BY MOUTH DAILY AS NEEDED FOR PAIN 90 tablet 3  . promethazine-dextromethorphan (PROMETHAZINE-DM) 6.25-15 MG/5ML syrup Take 5 mLs by mouth 4 (four) times daily as needed. 118 mL 0  . SYNTHROID 100 MCG tablet 1 po qd 90 tablet 3  . valACYclovir (VALTREX) 1000 MG tablet TAKE 1 TABLET BY MOUTH THREE TIMES DAILY, DISCONTINUE ALL PREVIOUS REFILLS FOR THIS MEDICATION 30 tablet 5   No current facility-administered medications for this visit.     PHYSICAL EXAMINATION: ECOG PERFORMANCE STATUS: 1 - Symptomatic but completely ambulatory  Vitals:   10/18/18 1523  BP: (!) 172/87  Pulse: 76  Resp: 18  Temp: 98.3 F (36.8 C)  SpO2: 100%   Filed Weights   10/18/18 1523  Weight: 261 lb 1.6 oz (118.4 kg)    GENERAL: alert, no distress and comfortable SKIN: skin color, texture, turgor are normal, no rashes or significant lesions EYES: normal, Conjunctiva are pink and non-injected, sclera clear OROPHARYNX: no exudate, no erythema and lips, buccal mucosa, and tongue normal  NECK: supple, thyroid normal size, non-tender, without nodularity LYMPH: no palpable lymphadenopathy in the cervical, axillary or inguinal LUNGS: clear to auscultation and percussion with normal breathing effort HEART: regular rate & rhythm and no murmurs and no lower extremity edema ABDOMEN: abdomen soft, non-tender and normal bowel sounds MUSCULOSKELETAL: no cyanosis of digits and no clubbing  NEURO: alert & oriented x 3 with fluent speech, no focal motor/sensory deficits EXTREMITIES: No lower extremity edema  LABORATORY DATA:  I have reviewed the data as listed CMP Latest Ref Rng & Units 10/05/2018 08/31/2018 08/04/2018  Glucose 70 - 99 mg/dL 103(H) 117(H) 79  BUN 6 - 20 mg/dL 9 18 16   Creatinine 0.44 - 1.00 mg/dL 0.71 0.84 0.66  Sodium 135 - 145 mmol/L 140 143 140  Potassium 3.5 - 5.1 mmol/L 4.0 4.8 4.0  Chloride 98 - 111 mmol/L 107 106 104  CO2 22 - 32  mmol/L 25 27 28   Calcium 8.9 - 10.3 mg/dL 8.6(L) 9.7 9.1  Total Protein 6.5 - 8.1 g/dL 6.5 7.7 7.0  Total Bilirubin 0.3 - 1.2 mg/dL 1.7(H) 1.4(H) 0.8  Alkaline Phos 38 - 126 U/L 56 81 72  AST 15 - 41 U/L 16 12(L) 11  ALT 0 - 44 U/L 15 18 10     Lab Results  Component Value Date   WBC 9.9 10/05/2018   HGB 12.9 10/05/2018   HCT 41.5 10/05/2018   MCV 84.5 10/05/2018   PLT 232 10/05/2018   NEUTROABS 6.8 10/05/2018    ASSESSMENT & PLAN:  Malignant neoplasm of upper-outer quadrant of left breast in female, estrogen receptor positive ([place]) 10/11/2018: Left lumpectomy: Grade 2 IDC with DCIS, 1 cm, margins negative, DCIS focally 0.1 cm from superior margin, 2/2 lymph nodes positive, ER 95%, PR 45%, HER-2 negative, Ki-67 20%, T1BN1a stage Ia  Pathology counseling: I discussed the final pathology report of the patient provided  a copy of this report. I discussed the margins as well as lymph node surgeries. We also discussed the final staging along with previously performed ER/PR and HER-2/neu testing.  Recommendation: 1.  Axillary lymph node dissection 2.  MammaPrint testing to determine chemotherapy benefit if less than 3 lymph nodes are positive 3.  Adjuvant radiation therapy 4.  Followed by adjuvant antiestrogen therapy  Genetic testing Return to clinic based upon final pathology report and or MammaPrint We will present her in the tumor board this Wednesday and inform her of the decision.  No orders of the defined types were placed in this encounter.  The patient has a good understanding of the overall plan. she agrees with it. she will call with any problems that may develop before the next visit here.  Nicholas Lose, MD 10/18/2018  Julious Oka Dorshimer am acting as scribe for Dr. Nicholas Lose.  .I have reviewed the above documentation for accuracy and completeness, and I agree with the above.

## 2018-10-18 NOTE — Assessment & Plan Note (Signed)
10/11/2018: Left lumpectomy: Grade 2 IDC with DCIS, 1 cm, margins negative, DCIS focally 0.1 cm from superior margin, 2/2 lymph nodes positive, ER 95%, PR 45%, HER-2 negative, Ki-67 20%, T1BN1a stage Ia  Pathology counseling: I discussed the final pathology report of the patient provided  a copy of this report. I discussed the margins as well as lymph node surgeries. We also discussed the final staging along with previously performed ER/PR and HER-2/neu testing.  Recommendation: 1.  Axillary lymph node dissection 2.  MammaPrint testing to determine chemotherapy benefit if less than 3 lymph nodes are positive 3.  Adjuvant radiation therapy 4.  Followed by adjuvant antiestrogen therapy  Genetic testing Return to clinic based upon final pathology report and or MammaPrint

## 2018-10-27 ENCOUNTER — Ambulatory Visit: Payer: BC Managed Care – PPO | Admitting: Family Medicine

## 2018-10-28 ENCOUNTER — Encounter: Payer: Self-pay | Admitting: Family Medicine

## 2018-10-29 NOTE — Telephone Encounter (Signed)
Please make sure pt is not charged for the no show --- she was with her oncologist at the time of the appointment Please let her reschedule at here convenience

## 2018-10-31 ENCOUNTER — Ambulatory Visit: Payer: Self-pay | Admitting: Surgery

## 2018-10-31 NOTE — H&P (Signed)
Kayla Banks Documented: 10/31/2018 9:26 AM Location: [facility] Patient #: [id number] DOB: 05/14/1974 Undefined / Language: Suszanne Banks / Race: White Female  History of Present Illness Kayla Banks A. Kayla Sedivy MD; 10/31/2018 11:23 AM) Patient words: Patient returns for follow-up of her right breast DCIS. She underwent additional biopsies of a 5 cm area of calcifications which showed intermediate to high-grade DCIS. She is here today to discuss surgical options. She had 2 areas in the right upper inner quadrant and one in the tail of Spence.  ADDITIONAL INFORMATION: 1. PROGNOSTIC INDICATORS Results: IMMUNOHISTOCHEMICAL AND MORPHOMETRIC ANALYSIS PERFORMED MANUALLY Estrogen Receptor: 0%, NEGATIVE Progesterone Receptor: 0%, NEGATIVE COMMENT: The negative hormone receptor study(ies) in this case has an internal positive control. REFERENCE RANGE ESTROGEN RECEPTOR NEGATIVE 0% POSITIVE =>1% REFERENCE RANGE PROGESTERONE RECEPTOR NEGATIVE 0% POSITIVE =>1% All controls stained appropriately Thressa Sheller MD Pathologist, Electronic Signature ( Signed 10/28/2018) 2. PROGNOSTIC INDICATORS Results: IMMUNOHISTOCHEMICAL AND MORPHOMETRIC ANALYSIS PERFORMED MANUALLY Estrogen Receptor: 70%, POSITIVE, STRONG STAINING INTENSITY Progesterone Receptor: 90%, POSITIVE, STRONG STAINING INTENSITY REFERENCE RANGE ESTROGEN RECEPTOR NEGATIVE 0% 1 of 3 FINAL for BODKIN, Kayla Banks ([id number]) ADDITIONAL INFORMATION:(continued) POSITIVE =>1% REFERENCE RANGE PROGESTERONE RECEPTOR NEGATIVE 0% POSITIVE =>1% All controls stained appropriately Thressa Sheller MD Pathologist, Electronic Signature ( Signed 10/28/2018) FINAL DIAGNOSIS Diagnosis 1. Breast, right, needle core biopsy, LOQ, middle depth - DUCTAL CARCINOMA IN SITU, HIGH GRADE WITH CALCIFICATIONS. SEE NOTE. 2. 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 SEE NOTE. Diagnosis Note 2. and 2. Dr. Jeannie Banks has reviewed this case and concurs with the above diagnosis. A breast prognostic profile (ER and PR) is pending and will be reported in an addendum. [facility] was notified on 10/26/2018. Kayla Folds MD Pathologist, Electronic Signature (Case signed 10/26/2018) Specimen Gross and Clinical Information Specimen Comment 1. In formalin 8:58, extracted 3 minutes ; yes positive of calcifications in same quadrant; recent diagnosis of right breast DCIS, lower, outer quadrant, 2 additional groups of calcifications in same quadrant biopsied for extent of disease 2. In formalin 9:17, extracted 3 minutes for each Specimen(s) Obtained: 1. Breast, right, needle core biopsy, LOQ, middle depth 2. Breast, right, needle core biopsy, LOQ, posterior depth Specimen Clinical Information 1. DCIS,        Calcifications in the right breast were recently biopsied, demonstrating high grade ductal carcinoma in situ. Calcifications in the left breast were biopsied demonstrating fibrocystic changes.  LABS: None provided  EXAM: BILATERAL BREAST MRI WITH AND WITHOUT CONTRAST  TECHNIQUE: Multiplanar, multisequence MR images of both breasts were obtained prior to and following the intravenous administration of 7.5 ml of Gadavist  Three-dimensional MR images were rendered by post-processing of the original MR data on an independent workstation. The three-dimensional MR images were interpreted, and findings are reported in the following complete MRI report for this study. Three dimensional images were evaluated at the independent DynaCad workstation  COMPARISON: Previous exam(s).  FINDINGS: Breast composition: c. Heterogeneous fibroglandular tissue.  Background parenchymal enhancement: Marked  Right breast: The biopsy clip at the site of known DCIS in the lower outer right breast is identified. There  is non mass enhancement located superior, slightly anterior, and posterior to the biopsy clip which corresponds to the site of remaining calcifications identified. This enhancement is difficult to definitively differentiate from the significant background enhancement on today's study. The overall extent of this enhancement is similar to the extent suggested by the remaining calcifications,  spanning nearly 5 cm. The calcifications span up to 4.6 cm.  Left breast: No mass or abnormal enhancement.  Lymph nodes: No abnormal appearing lymph nodes.  Ancillary findings: None.  IMPRESSION: 1. The patient's biopsy clip is noted. Enhancement extending superior, slightly anterior, and posterior to the biopsy clip roughly corresponds to the remaining calcifications. It is difficult to definitively differentiate this enhancement from background enhancement. 2. No evidence of malignancy in the left breast. 3. No axillary adenopathy.  RECOMMENDATION: Recommend stereotactic biopsy of the remaining calcifications seen mammographically in the lower outer right breast. Recommend biopsying the anterior and posterior extent of the remaining unbiopsied calcifications.  BI-RADS CATEGORY 6: Known biopsy-proven malignancy.  The patient is a 63 year old female.   Allergies Kayla Banks, CMA; 10/31/2018 9:26 AM) Tamiflu *ANTIVIRALS* Seizure Cipro *FLUOROQUINOLONES* Diarrhea, Nausea, Vomiting. Metoclopramide HCl *GASTROINTESTINAL AGENTS - MISC.* "Lock Jaw" per epic note Sulfa Antibiotics Nausea, Vomiting. Allergies Reconciled  Medication History Kayla Banks, CMA; 10/31/2018 9:26 AM) Kayla Banks (80MG  Capsule, Oral) Active. Flonase (50MCG/DOSE Inhaler, Nasal) Active. Neurontin (100MG  Capsule, Oral) Active. Mirena (52 MG) (20MCG/24HR IUD, Intrauterine) Active. Multiple Vitamin (1 (one) Oral) Active. PriLOSEC (40MG  Capsule DR, Oral) Active. Valtrex (1GM Tablet, Oral)  Active. Cranberry (200MG  Capsule, Oral) Active. Medications Reconciled    Vitals Kayla Banks CMA; 10/31/2018 9:27 AM) 10/31/2018 9:26 AM Weight: 153.5 lb Height: 63in Body Surface Area: 1.73 m Body Mass Index: 27.19 kg/m  Temp.: 98.43F(Oral)  Pulse: 97 (Regular)  P.OX: 97% (Room air) BP: 128/72 (Sitting, Left Arm, Standard)      Physical Exam (Dahlia Nifong A. Keymari Sato MD; 10/31/2018 11:24 AM)  General Mental Status-Alert. General Appearance-Consistent with stated age. Hydration-Well hydrated. Voice-Normal.  Breast Note: Bruising right breast in the tail Spence. No masses. Left breast normal.  Neurologic Neurologic evaluation reveals -alert and oriented x 3 with no impairment of recent or remote memory. Mental Status-Normal.    Assessment & Plan (Yeslin Delio A. Tagg Eustice MD; 10/31/2018 11:24 AM)  BREAST NEOPLASM, TIS (DCIS), RIGHT (D05.11) Impression: Multifocal  Discussed breast preservation versus mastectomy. We certainly can usually precedes to localize these areas and due to lumpectomies. I discussed the role of sentinel lymph node mapping in this circumstance as well as potential postoperative treatments in the need for reexcision lumpectomy in these cases as well as potential mastectomy and reconstruction. Risk of lumpectomy include bleeding, infection, seroma, more surgery, use of seed/wire, wound care, cosmetic deformity and the need for other treatments, death , blood clots, death. Pt agrees to proceed.  Current Plans We discussed the staging and pathophysiology of breast cancer. We discussed all of the different options for treatment for breast cancer including surgery, chemotherapy, radiation therapy, Herceptin, and antiestrogen therapy. We discussed a sentinel lymph node biopsy as she does not appear to having lymph node involvement right now. We discussed the performance of that with injection of radioactive tracer and blue dye. We discussed that  she would have an incision underneath her axillary hairline. We discussed that there is a bout a 10-20% chance of having a positive node with a sentinel lymph node biopsy and we will await the permanent pathology to make any other first further decisions in terms of her treatment. One of these options might be to return to the operating room to perform an axillary lymph node dissection. We discussed about a 1-2% risk lifetime of chronic shoulder pain as well as lymphedema associated with a sentinel lymph node biopsy. We discussed the options for treatment of the breast cancer which  included lumpectomy versus a mastectomy. We discussed the performance of the lumpectomy with a wire placement. We discussed a 10-20% chance of a positive margin requiring reexcision in the operating room. We also discussed that she may need radiation therapy or antiestrogen therapy or both if she undergoes lumpectomy. We discussed the mastectomy and the postoperative care for that as well. We discussed that there is no difference in her survival whether she undergoes lumpectomy with radiation therapy or antiestrogen therapy versus a mastectomy. There is a slight difference in the local recurrence rate being 3-5% with lumpectomy and about 1% with a mastectomy. We discussed the risks of operation including bleeding, infection, possible reoperation. She understands her further therapy will be based on what her stages at the time of her operation.  Pt Education - [facility] breast cancer surgery: discussed with patient and provided information. Pt Education - [facility] Breast Biopsy HCI: discussed with patient and provided information. Pt Education - [facility] Breast Pains Education Pt Education - ABC (After Breast Cancer) Class Info: discussed with patient and provided information.

## 2018-10-31 NOTE — H&P (View-Only) (Signed)
Arrie Senate Documented: 10/31/2018 9:26 AM Location: [facility] Patient #: [id number] DOB: 05/14/1974 Undefined / Language: Kayla Banks / Race: White Female  History of Present Illness [name] A. 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 She underwent additional biopsies of a 5 cm area of calcifications which showed intermediate to high-grade DCIS. She is here today to discuss surgical options. She had 2 areas in the right upper inner quadrant and one in the tail of Spence.  ADDITIONAL INFORMATION: 1. PROGNOSTIC INDICATORS Results: IMMUNOHISTOCHEMICAL AND MORPHOMETRIC ANALYSIS PERFORMED MANUALLY Estrogen Receptor: 0%, NEGATIVE Progesterone Receptor: 0%, NEGATIVE COMMENT: The negative hormone receptor study(ies) in this case has an internal positive control. REFERENCE RANGE ESTROGEN RECEPTOR NEGATIVE 0% POSITIVE =>1% REFERENCE RANGE PROGESTERONE RECEPTOR NEGATIVE 0% POSITIVE =>1% All controls stained appropriately Thressa Sheller MD Pathologist, Electronic Signature ( Signed 10/28/2018) 2. PROGNOSTIC INDICATORS Results: IMMUNOHISTOCHEMICAL AND MORPHOMETRIC ANALYSIS PERFORMED MANUALLY Estrogen Receptor: 70%, POSITIVE, STRONG STAINING INTENSITY Progesterone Receptor: 90%, POSITIVE, STRONG STAINING INTENSITY REFERENCE RANGE ESTROGEN RECEPTOR NEGATIVE 0% 1 of 3 FINAL for BODKIN, [name] ([id number]) ADDITIONAL INFORMATION:(continued) POSITIVE =>1% REFERENCE RANGE PROGESTERONE RECEPTOR NEGATIVE 0% POSITIVE =>1% All controls stained appropriately Thressa Sheller MD Pathologist, Electronic Signature ( Signed 10/28/2018) FINAL DIAGNOSIS Diagnosis 1. Breast, right, needle core biopsy, LOQ, middle depth - DUCTAL CARCINOMA IN SITU, HIGH GRADE WITH CALCIFICATIONS. SEE NOTE. 2. 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 SEE NOTE. Diagnosis Note 2. and 2. [name] has reviewed this case and concurs with the above diagnosis. A breast prognostic profile (ER and PR) is pending and will be reported in an addendum. The [facility] was notified on 10/26/2018. [name] MD Pathologist, Electronic Signature (Case signed 10/26/2018) Specimen Gross and Clinical Information Specimen Comment 1. In formalin 8:58, extracted 3 minutes ; yes positive of calcifications in same quadrant; recent diagnosis of right breast DCIS, lower, outer quadrant, 2 additional groups of calcifications in same quadrant biopsied for extent of disease 2. In formalin 9:17, extracted 3 minutes for each Specimen(s) Obtained: 1. Breast, right, needle core biopsy, LOQ, middle depth 2. Breast, right, needle core biopsy, LOQ, posterior depth Specimen Clinical Information 1. DCIS,        Calcifications in the right breast were recently biopsied, demonstrating high grade ductal carcinoma in situ. Calcifications in the left breast were biopsied demonstrating fibrocystic changes.  LABS: None provided  EXAM: BILATERAL BREAST MRI WITH AND WITHOUT CONTRAST  TECHNIQUE: Multiplanar, multisequence MR images of both breasts were obtained prior to and following the intravenous administration of 7.5 ml of Gadavist  Three-dimensional MR images were rendered by post-processing of the original MR data on an independent workstation. The three-dimensional MR images were interpreted, and findings are reported in the following complete MRI report for this study. Three dimensional images were evaluated at the independent DynaCad workstation  COMPARISON: Previous exam(s).  FINDINGS: Breast composition: c. Heterogeneous fibroglandular tissue.  Background parenchymal enhancement: Marked  Right breast: The biopsy clip at the site of known DCIS in the lower outer right breast is identified. There  is non mass enhancement located superior, slightly anterior, and posterior to the biopsy clip which corresponds to the site of remaining calcifications identified. This enhancement is difficult to definitively differentiate from the significant background enhancement on today's study. The overall extent of this enhancement is similar to the extent suggested by the remaining calcifications,  spanning nearly 5 cm. The calcifications span up to 4.6 cm.  Left breast: No mass or abnormal enhancement.  Lymph nodes: No abnormal appearing lymph nodes.  Ancillary findings: None.  IMPRESSION: 1. The patient's biopsy clip is noted. Enhancement extending superior, slightly anterior, and posterior to the biopsy clip roughly corresponds to the remaining calcifications. It is difficult to definitively differentiate this enhancement from background enhancement. 2. No evidence of malignancy in the left breast. 3. No axillary adenopathy.  RECOMMENDATION: Recommend stereotactic biopsy of the remaining calcifications seen mammographically in the lower outer right breast. Recommend biopsying the anterior and posterior extent of the remaining unbiopsied calcifications.  BI-RADS CATEGORY 6: Known biopsy-proven malignancy.  The patient is a 63 year old female.   Allergies Sabino Gasser, CMA; 10/31/2018 9:26 AM) Tamiflu *ANTIVIRALS* Seizure Cipro *FLUOROQUINOLONES* Diarrhea, Nausea, Vomiting. Metoclopramide HCl *GASTROINTESTINAL AGENTS - MISC.* "Lock Jaw" per epic note Sulfa Antibiotics Nausea, Vomiting. Allergies Reconciled  Medication History Sabino Gasser, CMA; 10/31/2018 9:26 AM) Christianne Borrow (80MG  Capsule, Oral) Active. Flonase (50MCG/DOSE Inhaler, Nasal) Active. Neurontin (100MG  Capsule, Oral) Active. Mirena (52 MG) (20MCG/24HR IUD, Intrauterine) Active. Multiple Vitamin (1 (one) Oral) Active. PriLOSEC (40MG  Capsule DR, Oral) Active. Valtrex (1GM Tablet, Oral)  Active. Cranberry (200MG  Capsule, Oral) Active. Medications Reconciled    Vitals Sabino Gasser CMA; 10/31/2018 9:27 AM) 10/31/2018 9:26 AM Weight: 153.5 lb Height: 63in Body Surface Area: 1.73 m Body Mass Index: 27.19 kg/m  Temp.: 98.71F(Oral)  Pulse: 97 (Regular)  P.OX: 97% (Room air) BP: 128/72 (Sitting, Left Arm, Standard)      Physical Exam (Donneisha Beane A. Mihira Tozzi MD; 10/31/2018 11:24 AM)  General Mental Status-Alert. General Appearance-Consistent with stated age. Hydration-Well hydrated. Voice-Normal.  Breast Note: Bruising right breast in the tail Spence. No masses. Left breast normal.  Neurologic Neurologic evaluation reveals -alert and oriented x 3 with no impairment of recent or remote memory. Mental Status-Normal.    Assessment & Plan (Ameliyah Sarno A. Nichlas Pitera MD; 10/31/2018 11:24 AM)  BREAST NEOPLASM, TIS (DCIS), RIGHT (D05.11) Impression: Multifocal  Discussed breast preservation versus mastectomy. We certainly can usually precedes to localize these areas and due to lumpectomies. I discussed the role of sentinel lymph node mapping in this circumstance as well as potential postoperative treatments in the need for reexcision lumpectomy in these cases as well as potential mastectomy and reconstruction. Risk of lumpectomy include bleeding, infection, seroma, more surgery, use of seed/wire, wound care, cosmetic deformity and the need for other treatments, death , blood clots, death. Pt agrees to proceed.  Current Plans We discussed the staging and pathophysiology of breast cancer. We discussed all of the different options for treatment for breast cancer including surgery, chemotherapy, radiation therapy, Herceptin, and antiestrogen therapy. We discussed a sentinel lymph node biopsy as she does not appear to having lymph node involvement right now. We discussed the performance of that with injection of radioactive tracer and blue dye. We discussed that  she would have an incision underneath her axillary hairline. We discussed that there is a bout a 10-20% chance of having a positive node with a sentinel lymph node biopsy and we will await the permanent pathology to make any other first further decisions in terms of her treatment. One of these options might be to return to the operating room to perform an axillary lymph node dissection. We discussed about a 1-2% risk lifetime of chronic shoulder pain as well as lymphedema associated with a sentinel lymph node biopsy. We discussed the options for treatment of the breast cancer which  included lumpectomy versus a mastectomy. We discussed the performance of the lumpectomy with a wire placement. We discussed a 10-20% chance of a positive margin requiring reexcision in the operating room. We also discussed that she may need radiation therapy or antiestrogen therapy or both if she undergoes lumpectomy. We discussed the mastectomy and the postoperative care for that as well. We discussed that there is no difference in her survival whether she undergoes lumpectomy with radiation therapy or antiestrogen therapy versus a mastectomy. There is a slight difference in the local recurrence rate being 3-5% with lumpectomy and about 1% with a mastectomy. We discussed the risks of operation including bleeding, infection, possible reoperation. She understands her further therapy will be based on what her stages at the time of her operation.  Pt Education - [facility] breast cancer surgery: discussed with patient and provided information. Pt Education - [facility] Breast Biopsy HCI: discussed with patient and provided information. Pt Education - [facility] Breast Pains Education Pt Education - [facility] (After Breast Cancer) Class Info: discussed with patient and provided information.

## 2018-11-02 ENCOUNTER — Other Ambulatory Visit: Payer: Self-pay

## 2018-11-02 ENCOUNTER — Encounter (HOSPITAL_BASED_OUTPATIENT_CLINIC_OR_DEPARTMENT_OTHER): Payer: Self-pay | Admitting: *Deleted

## 2018-11-07 ENCOUNTER — Encounter: Payer: BC Managed Care – PPO | Admitting: Physical Therapy

## 2018-11-08 NOTE — Progress Notes (Signed)
Ensure pre surgical drink given to pt with instructions to finish by 0800 DOS. hibiclens soap given to pt with instructions, pt verbalized understanding.

## 2018-11-09 ENCOUNTER — Encounter (HOSPITAL_BASED_OUTPATIENT_CLINIC_OR_DEPARTMENT_OTHER)
Admission: RE | Disposition: A | Discharge status: Home / Self Care | Payer: Self-pay | Source: Ambulatory Visit | Attending: Surgery

## 2018-11-09 ENCOUNTER — Encounter (HOSPITAL_BASED_OUTPATIENT_CLINIC_OR_DEPARTMENT_OTHER): Payer: Self-pay

## 2018-11-09 ENCOUNTER — Other Ambulatory Visit: Payer: Self-pay

## 2018-11-09 ENCOUNTER — Ambulatory Visit (HOSPITAL_BASED_OUTPATIENT_CLINIC_OR_DEPARTMENT_OTHER): Payer: BC Managed Care – PPO | Admitting: Anesthesiology

## 2018-11-09 ENCOUNTER — Ambulatory Visit (HOSPITAL_BASED_OUTPATIENT_CLINIC_OR_DEPARTMENT_OTHER)
Admission: RE | Admit: 2018-11-09 | Discharge: 2018-11-09 | Disposition: A | Discharge status: Home / Self Care | Payer: BC Managed Care – PPO | Source: Ambulatory Visit | Attending: Surgery | Admitting: Surgery

## 2018-11-09 DIAGNOSIS — C773 Secondary and unspecified malignant neoplasm of axilla and upper limb lymph nodes: Secondary | ICD-10-CM | POA: Diagnosis present

## 2018-11-09 DIAGNOSIS — E039 Hypothyroidism, unspecified: Secondary | ICD-10-CM | POA: Insufficient documentation

## 2018-11-09 DIAGNOSIS — Z6841 Body Mass Index (BMI) 40.0 and over, adult: Secondary | ICD-10-CM | POA: Insufficient documentation

## 2018-11-09 DIAGNOSIS — I10 Essential (primary) hypertension: Secondary | ICD-10-CM | POA: Diagnosis not present

## 2018-11-09 DIAGNOSIS — C50912 Malignant neoplasm of unspecified site of left female breast: Secondary | ICD-10-CM | POA: Diagnosis not present

## 2018-11-09 DIAGNOSIS — Z79899 Other long term (current) drug therapy: Secondary | ICD-10-CM | POA: Insufficient documentation

## 2018-11-09 HISTORY — PX: AXILLARY LYMPH NODE DISSECTION: SHX5229

## 2018-11-09 SURGERY — LYMPHADENECTOMY, AXILLARY
Anesthesia: General | Site: Axilla | Laterality: Left

## 2018-11-09 MED ORDER — PHENYLEPHRINE 40 MCG/ML (10ML) SYRINGE FOR IV PUSH (FOR BLOOD PRESSURE SUPPORT)
PREFILLED_SYRINGE | INTRAVENOUS | Status: DC | PRN
  Administered 2018-11-09 (×3): 80 ug via INTRAVENOUS

## 2018-11-09 MED ORDER — EPHEDRINE 5 MG/ML INJ
INTRAVENOUS | Status: AC
  Filled 2018-11-09: qty 10

## 2018-11-09 MED ORDER — PROPOFOL 10 MG/ML IV BOLUS
INTRAVENOUS | Status: AC
  Filled 2018-11-09: qty 20

## 2018-11-09 MED ORDER — FENTANYL CITRATE (PF) 100 MCG/2ML IJ SOLN
50.0000 ug | INTRAMUSCULAR | Status: DC | PRN
  Administered 2018-11-09 (×2): 50 ug via INTRAVENOUS

## 2018-11-09 MED ORDER — OXYCODONE HCL 5 MG PO TABS: 5.0000 mg | ORAL_TABLET | Freq: Once | ORAL | Status: DC | PRN

## 2018-11-09 MED ORDER — ONDANSETRON HCL 4 MG/2ML IJ SOLN
INTRAMUSCULAR | Status: DC | PRN
  Administered 2018-11-09: 4 mg via INTRAVENOUS

## 2018-11-09 MED ORDER — LIDOCAINE 2% (20 MG/ML) 5 ML SYRINGE
INTRAMUSCULAR | Status: AC
  Filled 2018-11-09: qty 5

## 2018-11-09 MED ORDER — GABAPENTIN 300 MG PO CAPS
ORAL_CAPSULE | ORAL | Status: AC
  Filled 2018-11-09: qty 1

## 2018-11-09 MED ORDER — LIDOCAINE HCL (CARDIAC) PF 100 MG/5ML IV SOSY
PREFILLED_SYRINGE | INTRAVENOUS | Status: DC | PRN
  Administered 2018-11-09: 100 mg via INTRAVENOUS

## 2018-11-09 MED ORDER — FENTANYL CITRATE (PF) 100 MCG/2ML IJ SOLN
INTRAMUSCULAR | Status: AC
  Filled 2018-11-09: qty 2

## 2018-11-09 MED ORDER — OXYCODONE HCL 5 MG/5ML PO SOLN: 5.0000 mg | Freq: Once | ORAL | Status: DC | PRN

## 2018-11-09 MED ORDER — CHLORHEXIDINE GLUCONATE CLOTH 2 % EX PADS: 6.0000 | MEDICATED_PAD | Freq: Once | CUTANEOUS | Status: DC

## 2018-11-09 MED ORDER — SCOPOLAMINE 1 MG/3DAYS TD PT72: 1.0000 | MEDICATED_PATCH | Freq: Once | TRANSDERMAL | Status: DC | PRN

## 2018-11-09 MED ORDER — HYDROMORPHONE HCL 1 MG/ML IJ SOLN
0.2500 mg | INTRAMUSCULAR | Status: DC | PRN
  Administered 2018-11-09: 0.25 mg via INTRAVENOUS

## 2018-11-09 MED ORDER — ACETAMINOPHEN 500 MG PO TABS
ORAL_TABLET | ORAL | Status: AC
  Filled 2018-11-09: qty 2

## 2018-11-09 MED ORDER — LACTATED RINGERS IV SOLN
INTRAVENOUS | Status: DC
  Administered 2018-11-09 (×2): via INTRAVENOUS

## 2018-11-09 MED ORDER — CLINDAMYCIN PHOSPHATE 900 MG/50ML IV SOLN
INTRAVENOUS | Status: AC
  Filled 2018-11-09: qty 50

## 2018-11-09 MED ORDER — GABAPENTIN 300 MG PO CAPS
300.0000 mg | ORAL_CAPSULE | ORAL | Status: AC
  Administered 2018-11-09: 300 mg via ORAL

## 2018-11-09 MED ORDER — ONDANSETRON HCL 4 MG/2ML IJ SOLN
INTRAMUSCULAR | Status: AC
  Filled 2018-11-09: qty 2

## 2018-11-09 MED ORDER — PROMETHAZINE HCL 25 MG/ML IJ SOLN: 6.2500 mg | INTRAMUSCULAR | Status: DC | PRN

## 2018-11-09 MED ORDER — BUPIVACAINE-EPINEPHRINE 0.25% -1:200000 IJ SOLN
INTRAMUSCULAR | Status: DC | PRN
  Administered 2018-11-09: 30 mL

## 2018-11-09 MED ORDER — CLINDAMYCIN PHOSPHATE 900 MG/50ML IV SOLN
900.0000 mg | INTRAVENOUS | Status: AC
  Administered 2018-11-09: 900 mg via INTRAVENOUS

## 2018-11-09 MED ORDER — HYDROMORPHONE HCL 1 MG/ML IJ SOLN
INTRAMUSCULAR | Status: AC
  Filled 2018-11-09: qty 0.5

## 2018-11-09 MED ORDER — MIDAZOLAM HCL 2 MG/2ML IJ SOLN
1.0000 mg | INTRAMUSCULAR | Status: DC | PRN
  Administered 2018-11-09: 2 mg via INTRAVENOUS

## 2018-11-09 MED ORDER — MEPERIDINE HCL 25 MG/ML IJ SOLN: 6.2500 mg | INTRAMUSCULAR | Status: DC | PRN

## 2018-11-09 MED ORDER — EPHEDRINE SULFATE-NACL 50-0.9 MG/10ML-% IV SOSY
PREFILLED_SYRINGE | INTRAVENOUS | Status: DC | PRN
  Administered 2018-11-09 (×6): 5 mg via INTRAVENOUS

## 2018-11-09 MED ORDER — MIDAZOLAM HCL 2 MG/2ML IJ SOLN
INTRAMUSCULAR | Status: AC
  Filled 2018-11-09: qty 2

## 2018-11-09 MED ORDER — ACETAMINOPHEN 500 MG PO TABS
1000.0000 mg | ORAL_TABLET | ORAL | Status: AC
  Administered 2018-11-09: 1000 mg via ORAL

## 2018-11-09 MED ORDER — DEXAMETHASONE SODIUM PHOSPHATE 10 MG/ML IJ SOLN
INTRAMUSCULAR | Status: AC
  Filled 2018-11-09: qty 1

## 2018-11-09 MED ORDER — PHENYLEPHRINE 40 MCG/ML (10ML) SYRINGE FOR IV PUSH (FOR BLOOD PRESSURE SUPPORT)
PREFILLED_SYRINGE | INTRAVENOUS | Status: AC
  Filled 2018-11-09: qty 10

## 2018-11-09 MED ORDER — DEXAMETHASONE SODIUM PHOSPHATE 4 MG/ML IJ SOLN
INTRAMUSCULAR | Status: DC | PRN
  Administered 2018-11-09: 10 mg via INTRAVENOUS

## 2018-11-09 MED ORDER — PROPOFOL 10 MG/ML IV BOLUS
INTRAVENOUS | Status: DC | PRN
  Administered 2018-11-09: 200 mg via INTRAVENOUS

## 2018-11-09 SURGICAL SUPPLY — 64 items
APPLIER CLIP 11 MED OPEN (CLIP) ×6
APPLIER CLIP 9.375 MED OPEN (MISCELLANEOUS)
APR CLP MED 9.3 20 MLT OPN (MISCELLANEOUS)
BIOPATCH RED 1 DISK 7.0 (GAUZE/BANDAGES/DRESSINGS) ×2 IMPLANT
BIOPATCH RED 1IN DISK 7.0MM (GAUZE/BANDAGES/DRESSINGS) ×1
BLADE CLIPPER SURG (BLADE) IMPLANT
BLADE SURG 15 STRL LF DISP TIS (BLADE) ×1 IMPLANT
BLADE SURG 15 STRL SS (BLADE) ×2
CANISTER SUCT 1200ML W/VALVE (MISCELLANEOUS) ×3 IMPLANT
CHLORAPREP W/TINT 26 (MISCELLANEOUS) ×3 IMPLANT
CLIP APPLIE 11 MED OPEN (CLIP) ×2 IMPLANT
CLIP APPLIE 9.375 MED OPEN (MISCELLANEOUS) IMPLANT
COVER BACK TABLE 60X90IN (DRAPES) ×3 IMPLANT
COVER MAYO STAND STRL (DRAPES) ×3 IMPLANT
COVER SURGICAL LIGHT HANDLE (MISCELLANEOUS) ×3 IMPLANT
COVER WAND RF STERILE (DRAPES) IMPLANT
DECANTER SPIKE VIAL GLASS SM (MISCELLANEOUS) ×3 IMPLANT
DERMABOND ADVANCED (GAUZE/BANDAGES/DRESSINGS) ×2
DERMABOND ADVANCED .7 DNX12 (GAUZE/BANDAGES/DRESSINGS) ×1 IMPLANT
DRAIN CHANNEL 19F RND (DRAIN) ×3 IMPLANT
DRAPE LAPAROSCOPIC ABDOMINAL (DRAPES) ×3 IMPLANT
DRAPE SURG 17X23 STRL (DRAPES) ×3 IMPLANT
DRAPE UTILITY XL STRL (DRAPES) ×3 IMPLANT
ELECT COATED BLADE 2.86 ST (ELECTRODE) ×3 IMPLANT
ELECT REM PT RETURN 9FT ADLT (ELECTROSURGICAL) ×3
ELECTRODE REM PT RTRN 9FT ADLT (ELECTROSURGICAL) ×1 IMPLANT
EVACUATOR SILICONE 100CC (DRAIN) ×3 IMPLANT
GAUZE SPONGE 4X4 12PLY STRL (GAUZE/BANDAGES/DRESSINGS) ×3 IMPLANT
GLOVE BIO SURGEON STRL SZ7 (GLOVE) ×3 IMPLANT
GLOVE BIOGEL PI IND STRL 7.0 (GLOVE) ×1 IMPLANT
GLOVE BIOGEL PI IND STRL 7.5 (GLOVE) ×2 IMPLANT
GLOVE BIOGEL PI IND STRL 8 (GLOVE) ×1 IMPLANT
GLOVE BIOGEL PI INDICATOR 7.0 (GLOVE) ×2
GLOVE BIOGEL PI INDICATOR 7.5 (GLOVE) ×4
GLOVE BIOGEL PI INDICATOR 8 (GLOVE) ×2
GLOVE ECLIPSE 6.0 STRL STRAW (GLOVE) ×3 IMPLANT
GLOVE ECLIPSE 8.0 STRL XLNG CF (GLOVE) ×6 IMPLANT
GOWN STRL REUS W/ TWL LRG LVL3 (GOWN DISPOSABLE) ×2 IMPLANT
GOWN STRL REUS W/ TWL XL LVL3 (GOWN DISPOSABLE) ×1 IMPLANT
GOWN STRL REUS W/TWL LRG LVL3 (GOWN DISPOSABLE) ×4
GOWN STRL REUS W/TWL XL LVL3 (GOWN DISPOSABLE) ×3
HEMOSTAT ARISTA ABSORB 3G PWDR (HEMOSTASIS) IMPLANT
HEMOSTAT SNOW SURGICEL 2X4 (HEMOSTASIS) ×3 IMPLANT
HEMOSTAT SURGICEL 2X14 (HEMOSTASIS) IMPLANT
NEEDLE HYPO 25X1 1.5 SAFETY (NEEDLE) IMPLANT
NS IRRIG 1000ML POUR BTL (IV SOLUTION) ×3 IMPLANT
PACK BASIN DAY SURGERY FS (CUSTOM PROCEDURE TRAY) ×3 IMPLANT
PENCIL BUTTON HOLSTER BLD 10FT (ELECTRODE) ×3 IMPLANT
PIN SAFETY STERILE (MISCELLANEOUS) ×3 IMPLANT
SLEEVE SCD COMPRESS KNEE MED (MISCELLANEOUS) ×3 IMPLANT
SPONGE LAP 4X18 RFD (DISPOSABLE) ×3 IMPLANT
SUT ETHILON 3 0 PS 1 (SUTURE) ×3 IMPLANT
SUT MNCRL AB 4-0 PS2 18 (SUTURE) ×3 IMPLANT
SUT SILK 3 0 SH 30 (SUTURE) IMPLANT
SUT VIC AB 2-0 SH 27 (SUTURE)
SUT VIC AB 2-0 SH 27XBRD (SUTURE) IMPLANT
SUT VICRYL 3-0 CR8 SH (SUTURE) ×3 IMPLANT
SUT VICRYL AB 2 0 TIE (SUTURE) IMPLANT
SUT VICRYL AB 2 0 TIES (SUTURE)
SYR CONTROL 10ML LL (SYRINGE) ×3 IMPLANT
TOWEL GREEN STERILE FF (TOWEL DISPOSABLE) ×3 IMPLANT
TUBE CONNECTING 20'X1/4 (TUBING) ×1
TUBE CONNECTING 20X1/4 (TUBING) ×2 IMPLANT
YANKAUER SUCT BULB TIP NO VENT (SUCTIONS) ×3 IMPLANT

## 2018-11-09 NOTE — Discharge Instructions (Signed)
Next Tylenol at 4:30 PM   Axillary Lymph Node Dissection, Care After This sheet gives you information about how to care for yourself after your procedure. Your health care provider may also give you more specific instructions. If you have problems or questions, contact your health care provider. What can I expect after the procedure? After the procedure, it is common to have:  Pain and soreness around your incision area.  Trouble moving your arm or shoulder.  A small amount of swelling in your arm.  Numbness on the upper and inside parts of your arm. Follow these instructions at home: Medicines  Take over-the-counter and prescription medicines only as told by your health care provider.  If you were prescribed an antibiotic medicine, take it as told by your health care provider. Do not stop taking the antibiotic even if you start to feel better. Incision care  Follow instructions from your health care provider about how to take care of your incision. Make sure you: ? Wash your hands with soap and water before you change your bandage (dressing). If soap and water are not available, use hand sanitizer. ? Change your dressing as told by your health care provider. ? Leave stitches (sutures), skin glue, or adhesive strips in place. These skin closures may need to stay in place for 2 weeks or longer. If adhesive strip edges start to loosen and curl up, you may trim the loose edges. Do not remove adhesive strips completely unless your health care provider tells you to do that.  Check your incision area every day for signs of infection. Check for: ? Redness, swelling, or pain. ? Fluid or blood. ? Warmth. ? Pus or a bad smell. Activity  Do arm and shoulder exercises as told by your health care provider. This may prevent movement problems and stiffness.  Return to your normal activities as told by your health care provider. Ask your health care provider what activities are safe for you.  Avoid any activities that cause pain. Driving  Do not drive for 24 hours if you were given a medicine to help you relax (sedative).  Do not drive or use heavy machinery while taking prescription pain medicine. General instructions  If a drainage tube was left in your breast, care for it as told by your health care provider. The drain may stay in place for up to 7-10 days.  Wear a compression garment on your arm as told by your health care provider. This may help to prevent blood clots and reduce swelling in your arm.  Do not use any products that contain nicotine or tobacco, such as cigarettes and e-cigarettes. If you need help quitting, ask your health care provider.  Do not take baths, swim, or use a hot tub until your health care provider approves. Ask your health care provider if you may take showers. You may only be allowed to take sponge baths for bathing.  Do not have your blood pressure taken, have blood drawn, or get injections or IVs in the arm on the side where your lymph nodes were removed.  Follow instructions from your health care provider about how often you should be screened for extra fluid around your lymph nodes (lymphedema).  Keep all follow-up visits as told by your health care provider. This is important. Contact a health care provider if:  Your arm is swollen, tight, and painful.  You have redness, swelling, or pain around your incision.  You have fluid or blood coming from your  incision.  Your incision feels warm to the touch.  You have pus or a bad smell coming from your incision. Get help right away if:  You have severe pain that does not get better with medicine.  You have a fever or chills.  You are confused.  You have chest pain.  You have shortness of breath. Summary  After the procedure, it is common to have pain and soreness and trouble moving your arm or shoulder.  A small amount of arm swelling is normal after the procedure. However,  you should contact your health care provider if your arm is swollen, tight, and painful.  Wear a compression garment on your arm as told by your health care provider. This may help to prevent blood clots and reduce swelling in your arm.  Do arm and shoulder exercises as directed to help prevent movement problems and stiffness.  If a drainage tube was left in your breast, care for it as told by your health care provider. This information is not intended to replace advice given to you by your health care provider. Make sure you discuss any questions you have with your health care provider. Document Released: 09/30/2016 Document Revised: 03/16/2017 Document Reviewed: 09/30/2016 Elsevier Interactive Patient Education  2019 [place] Surgical drains are used to remove extra fluid that normally builds up in a surgical wound after surgery. A surgical drain helps to heal a surgical wound. Different kinds of surgical drains include:  Active drains. These drains use suction to pull drainage away from the surgical wound. Drainage flows through a tube to a container outside of the body. It is important to keep the bulb or the drainage container flat (compressed) at all times, except while you empty it. Flattening the bulb or container creates suction. The two most common types of active drains are bulb drains and Hemovac drains.  Passive drains. These drains allow fluid to drain naturally, by gravity. Drainage flows through a tube to a bandage (dressing) or a container outside of the body. Passive drains do not need to be emptied. The most common type of passive drain is the Penrose drain. A drain is placed during surgery. Immediately after surgery, drainage is usually bright red and a little thicker than water. The drainage may gradually turn yellow or pink and become thinner. It is likely that your health care provider will remove the drain when the drainage stops or when the  amount decreases to 1-2 Tbsp (15-30 mL) during a 24-hour period. How to care for your surgical drain It is important to care for your drain to prevent infection. If your drain is placed at your back, or any other hard-to-reach area, ask another person to assist you in performing the following steps:  Keep the skin around the drain dry and covered with a dressing at all times.  Check your drain area every day for signs of infection. Check for: ? More redness, swelling, or pain. ? Pus or a bad smell. ? Cloudy drainage. Follow instructions from your health care provider about how to take care of your drain and how to change your dressing. Change your dressing at least one time every day. Change it more often if needed to keep the dressing dry. Make sure you: 1. Gather your supplies, including: ? Tape. ? Germ-free cleaning solution (sterile saline). ? Split gauze drain sponge: 4 x 4 inches (10 x 10 cm). ? Gauze square: 4 x 4 inches (10 x 10 cm).  2. Wash your hands with soap and water before you change your dressing. If soap and water are not available, use hand sanitizer. 3. Remove the old dressing. Avoid using scissors to do that. 4. Use sterile saline to clean your skin around the drain. 5. Place the tube through the slit in a drain sponge. Place the drain sponge so that it covers your wound. 6. Place the gauze square or another drain sponge on top of the drain sponge that is on the wound. Make sure the tube is between those layers. 7. Tape the dressing to your skin. 8. If you have an active bulb or Hemovac drain, tape the drainage tube to your skin 1-2 inches (2.5-5 cm) below the place where the tube enters your body. Taping keeps the tube from pulling on any stitches (sutures) that you have. 9. Wash your hands with soap and water. 10. Write down the color of your drainage and how often you change your dressing. How to empty your active bulb or Hemovac drain  1. Make sure that you have a  measuring cup that you can empty your drainage into. 2. Wash your hands with soap and water. If soap and water are not available, use hand sanitizer. 3. Gently move your fingers down the tube while squeezing very lightly. This is called stripping the tube. This clears any drainage, clots, or tissue from the tube. ? Do not pull on the tube. ? You may need to strip the tube several times every day to keep the tube clear. 4. Open the bulb cap or the drain plug. Do not touch the inside of the cap or the bottom of the plug. 5. Empty all of the drainage into the measuring cup. 6. Compress the bulb or the container and replace the cap or the plug. To compress the bulb or the container, squeeze it firmly in the middle while you close the cap or plug the container. 7. Write down the amount of drainage that you have in each 24-hour period. If you have less than 2 Tbsp (30 mL) of drainage during 24 hours, contact your health care provider. 8. Flush the drainage down the toilet. 9. Wash your hands with soap and water. Contact a health care provider if:  You have more redness, swelling, or pain around your drain area.  The amount of drainage that you have is increasing instead of decreasing.  You have pus or a bad smell coming from your drain area.  You have a fever.  You have drainage that is cloudy.  There is a sudden stop or a sudden decrease in the amount of drainage that you have.  Your tube falls out.  Your active draindoes not stay compressedafter you empty it. Summary  Surgical drains are used to remove extra fluid that normally builds up in a surgical wound after surgery.  Different kinds of surgical drains include active drains and passive drains. Active drains use suction to pull drainage away from the surgical wound, and passive drains allow fluid to drain naturally.  It is important to care for your drain to prevent infection. If your drain is placed at your back, or any other  hard-to-reach area, ask another person to assist you.  Contact your health care provider if you have more redness, swelling, or pain around your drain area. This information is not intended to replace advice given to you by your health care provider. Make sure you discuss any questions you have with your health care  provider. Document Released: 07/31/2000 Document Revised: 08/26/2017 Document Reviewed: 02/20/2015 Elsevier Interactive Patient Education  2019 [place] Anesthesia Home Care Instructions  Activity: Get plenty of rest for the remainder of the day. A responsible individual must stay with you for 24 hours following the procedure.  For the next 24 hours, DO NOT: -Drive a car -Paediatric nurse -Drink alcoholic beverages -Take any medication unless instructed by your physician -Make any legal decisions or sign important papers.  Meals: Start with liquid foods such as gelatin or soup. Progress to regular foods as tolerated. Avoid greasy, spicy, heavy foods. If nausea and/or vomiting occur, drink only clear liquids until the nausea and/or vomiting subsides. Call your physician if vomiting continues.  Special Instructions/Symptoms: Your throat may feel dry or sore from the anesthesia or the breathing tube placed in your throat during surgery. If this causes discomfort, gargle with warm salt water. The discomfort should disappear within 24 hours.  If you had a scopolamine patch placed behind your ear for the management of post- operative nausea and/or vomiting:  1. The medication in the patch is effective for 72 hours, after which it should be removed.  Wrap patch in a tissue and discard in the trash. Wash hands thoroughly with soap and water. 2. You may remove the patch earlier than 72 hours if you experience unpleasant side effects which may include dry mouth, dizziness or visual disturbances. 3. Avoid touching the patch. Wash your hands with soap and water after  contact with the patch.

## 2018-11-09 NOTE — Anesthesia Postprocedure Evaluation (Signed)
Anesthesia Post Note  Patient: Production manager  Procedure(s) Performed: LEFT AXILLARY LYMPH NODE DISSECTION (Left Axilla)     Patient location during evaluation: PACU Anesthesia Type: General Level of consciousness: awake and alert Pain management: pain level controlled Vital Signs Assessment: post-procedure vital signs reviewed and stable Respiratory status: spontaneous breathing, nonlabored ventilation and respiratory function stable Cardiovascular status: blood pressure returned to baseline and stable Postop Assessment: no apparent nausea or vomiting Anesthetic complications: no    Last Vitals:  Vitals:   11/09/18 1445 11/09/18 1527  BP: 134/73 138/81  Pulse: 66 65  Resp: 12 16  Temp:  36.6 C  SpO2: 97% 96%    Last Pain:  Vitals:   11/09/18 1527  TempSrc:   PainSc: [place]

## 2018-11-09 NOTE — Op Note (Signed)
Preoperative diagnosis: Left breast cancer  Postoperative diagnosis: Same  Procedure: Left breast axillary lymph node dissection  Surgeon: Erroll Luna, MD  Anesthesia: General with 0.25% Sensorcaine local with epinephrine  EBL: 20 cc  Drains: 19 French round drain to left axillary contents  Specimen left axillary contents to pathology  IV fluids: Per anesthesia record  Indications for procedure: The patient presents for completion left axillary lymph node dissection after undergoing lumpectomy with sentinel lymph node mapping which had 2 out of 2 positive nodes.  We discussed treatment options to include radiation therapy versus completion node dissection.  Nomograms predicted that she would have a better than a 30 or 40% chance of additional lymph node disease therefore dissection recommended. lymph node dissection has been discussed with the patient.  Risk of bleeding,  Infection,  Seroma formation,  Additional procedures,,  Shoulder weakness ,  Shoulder stiffness,  Nerve and blood vessel injury and reaction to the mapping dyes have been discussed.  Alternatives to surgery have been discussed with the patient.  The patient agrees to proceed.  Description of procedure: The patient was met in the holding area.  Left side was marked as correct and questions were answered.  She was brought to the operating room.  She is placed supine upon the operating room table.  Left axilla was prepped and draped in sterile fashion timeout was done and she received appropriate preoperative antibiotics.  The old incision in the left axilla was opened.  Seroma evacuated.  This was enlarged to give a better view of the axillary contents.  The extra contents were dissected away from the long thoracic nerve, the axillary vein and thoracodorsal trunk.  Branches of the axillary vein to the x-ray contents were controlled with clips.  Intercostal brachial nerves a were divided as they entered the axillary contents as  well.  All axillary contents in this region were removed in an en bloc fashion with multiple nodes within it.  Upon removal of the lymph node compartment of level 1 level 2 was examined and there is no adenopathy.  The long thoracic nerve, thoracodorsal trunk and axillary vein were all intact.  The wound was irrigated.  Surgicel snow was placed in the cavity.  19 round drain was placed through a separate stab incision into the axillary contents.  Deep layer of tissue was closed with 3-0 Vicryl.  4 Monocryl was used to close skin in a subcuticular fashion.  All final counts were found to be correct.  Dry dressings applied.  The patient was awoke extubated taken to recovery in satisfactory condition.

## 2018-11-09 NOTE — Anesthesia Procedure Notes (Signed)
Procedure Name: LMA Insertion Date/Time: 11/09/2018 12:23 PM Performed by: Lyndee Leo, CRNA Pre-anesthesia Checklist: Patient identified, Emergency Drugs available, Suction available and Patient being monitored Patient Re-evaluated:Patient Re-evaluated prior to induction Oxygen Delivery Method: Circle system utilized Preoxygenation: Pre-oxygenation with 100% oxygen Induction Type: IV induction Ventilation: Mask ventilation without difficulty LMA: LMA inserted LMA Size: 4.0 Number of attempts: 1 Airway Equipment and Method: Bite block Placement Confirmation: positive ETCO2 Tube secured with: Tape Dental Injury: Teeth and Oropharynx as per pre-operative assessment

## 2018-11-09 NOTE — Interval H&P Note (Signed)
History and Physical Interval Note:  11/09/2018 11:26 AM  Kayla Banks  has presented today for surgery, with the diagnosis of LEFT BREAST CANCER.  The various methods of treatment have been discussed with the patient and family. After consideration of risks, benefits and other options for treatment, the patient has consented to  Procedure(s): LEFT AXILLARY LYMPH NODE DISSECTION (Left) as a surgical intervention.  The patient's history has been reviewed, patient examined, no change in status, stable for surgery.  I have reviewed the patient's chart and labs.  Questions were answered to the patient's satisfaction.     [place]

## 2018-11-09 NOTE — Anesthesia Preprocedure Evaluation (Signed)
Anesthesia Evaluation  Patient identified by MRN, date of birth, ID band Patient awake    Reviewed: Allergy & Precautions, NPO status , Patient's Chart, lab work & pertinent test results, reviewed documented beta blocker date and time   Airway Mallampati: II  TM Distance: >3 FB Neck ROM: Full    Dental  (+) Teeth Intact, Dental Advisory Given   Pulmonary neg pulmonary ROS,    Pulmonary exam normal breath sounds clear to auscultation       Cardiovascular hypertension, Pt. on medications and Pt. on home beta blockers Normal cardiovascular exam Rhythm:Regular Rate:Normal     Neuro/Psych negative neurological ROS  negative psych ROS   GI/Hepatic negative GI ROS, Neg liver ROS,   Endo/Other  Hypothyroidism Morbid obesity  Renal/GU negative Renal ROS     Musculoskeletal  (+) Arthritis ,   Abdominal (+) + obese,   Peds  Hematology negative hematology ROS (+)   Anesthesia Other Findings Day of surgery medications reviewed with the patient.  Reproductive/Obstetrics                             Anesthesia Physical  Anesthesia Plan  ASA: III  Anesthesia Plan: General   Post-op Pain Management:    Induction: Intravenous  PONV Risk Score and Plan: 3 and Midazolam, Dexamethasone and Ondansetron  Airway Management Planned: LMA  Additional Equipment:   Intra-op Plan:   Post-operative Plan: Extubation in OR  Informed Consent: I have reviewed the patients History and Physical, chart, labs and discussed the procedure including the risks, benefits and alternatives for the proposed anesthesia with the patient or authorized representative who has indicated his/her understanding and acceptance.     Dental advisory given  Plan Discussed with: CRNA  Anesthesia Plan Comments:         Anesthesia Quick Evaluation

## 2018-11-09 NOTE — Transfer of Care (Signed)
Immediate Anesthesia Transfer of Care Note  Patient: Kayla Banks  Procedure(s) Performed: LEFT AXILLARY LYMPH NODE DISSECTION (Left Axilla)  Patient Location: PACU  Anesthesia Type:General  Level of Consciousness: awake, alert , oriented and patient cooperative  Airway & Oxygen Therapy: Patient Spontanous Breathing and Patient connected to face mask oxygen  Post-op Assessment: Report given to RN and Post -op Vital signs reviewed and stable  Post vital signs: Reviewed and stable  Last Vitals:  Vitals Value Taken Time  BP 130/72 11/09/2018  1:53 PM  Temp    Pulse 70 11/09/2018  1:55 PM  Resp 18 11/09/2018  1:55 PM  SpO2 100 % 11/09/2018  1:55 PM  Vitals shown include unvalidated device data.  Last Pain:  Vitals:   11/09/18 1012  TempSrc: Oral  PainSc: 0-No pain         Complications: No apparent anesthesia complications

## 2018-11-10 ENCOUNTER — Encounter (HOSPITAL_BASED_OUTPATIENT_CLINIC_OR_DEPARTMENT_OTHER): Payer: Self-pay | Admitting: Surgery

## 2018-11-14 ENCOUNTER — Other Ambulatory Visit: Payer: Self-pay

## 2018-11-14 ENCOUNTER — Other Ambulatory Visit: Payer: Self-pay | Admitting: *Deleted

## 2018-11-14 ENCOUNTER — Inpatient Hospital Stay: Payer: BC Managed Care – PPO | Admitting: Hematology and Oncology

## 2018-11-14 VITALS — BP 174/74 | HR 71 | Temp 98.4°F | Resp 16 | Wt 267.3 lb

## 2018-11-14 DIAGNOSIS — C50412 Malignant neoplasm of upper-outer quadrant of left female breast: Secondary | ICD-10-CM

## 2018-11-14 DIAGNOSIS — Z170 Estrogen receptor positive status [ER+]: Secondary | ICD-10-CM | POA: Diagnosis not present

## 2018-11-14 MED ORDER — DEXAMETHASONE 4 MG PO TABS: ORAL_TABLET | ORAL | 0 refills | Status: DC

## 2018-11-14 MED ORDER — LIDOCAINE-PRILOCAINE 2.5-2.5 % EX CREA: TOPICAL_CREAM | CUTANEOUS | 3 refills | Status: DC

## 2018-11-14 MED ORDER — LORAZEPAM 0.5 MG PO TABS: 0.5000 mg | ORAL_TABLET | Freq: Every evening | ORAL | 0 refills | Status: DC | PRN

## 2018-11-14 MED ORDER — PROCHLORPERAZINE MALEATE 10 MG PO TABS: 10.0000 mg | ORAL_TABLET | Freq: Four times a day (QID) | ORAL | 1 refills | Status: DC | PRN

## 2018-11-14 MED ORDER — ONDANSETRON HCL 8 MG PO TABS: 8.0000 mg | ORAL_TABLET | Freq: Two times a day (BID) | ORAL | 1 refills | Status: DC | PRN

## 2018-11-14 NOTE — Progress Notes (Signed)
START ON PATHWAY REGIMEN - Breast   Dose-Dense AC q14 days:   A cycle is every 14 days:     Doxorubicin      Cyclophosphamide      Pegfilgrastim-xxxx   **Always confirm dose/schedule in your pharmacy ordering system**  Paclitaxel 80 mg/m2 Weekly:   Administer weekly:     Paclitaxel   **Always confirm dose/schedule in your pharmacy ordering system**  Patient Characteristics: Postoperative without Neoadjuvant Therapy (Pathologic Staging), Invasive Disease, Adjuvant Therapy, HER2 Negative/Unknown/Equivocal, ER Positive, Node Positive, Node Positive (4+) Therapeutic Status: Postoperative without Neoadjuvant Therapy (Pathologic Staging) AJCC Grade: G2 AJCC N Category: pN2a AJCC M Category: cM0 ER Status: Positive (+) AJCC 8 Stage Grouping: IB HER2 Status: Negative (-) Oncotype Dx Recurrence Score: Not Appropriate AJCC T Category: pT1b PR Status: Positive (+) Intent of Therapy: Curative Intent, Discussed with Patient

## 2018-11-14 NOTE — Progress Notes (Signed)
HEMATOLOGY-ONCOLOGY [place] VISIT PROGRESS NOTE  I connected with Kayla Banks on 11/14/2018 at  3:15 PM EDT by Webex video conference and verified that I am speaking with the correct person using two identifiers.  I discussed the limitations, risks, security and privacy concerns of performing an evaluation and management service by Webex and the availability of in person appointments.  I also discussed with the patient that there may be a patient responsible charge related to this service. The patient expressed understanding and agreed to proceed.   CHIEF COMPLIANT: Patient is here to discuss the treatment plan  INTERVAL HISTORY: Kayla Banks is a 63 year old who initially had BCS and then underwent axillary LN dissection. Shes here to discuss adj treatment plan. Recovering well from surgery.    Malignant neoplasm of upper-outer quadrant of left breast in female, estrogen receptor positive ([place])   08/24/2018 Initial Biopsy    Screening detected left breast mass at 2:30 position 6 mm in size with one abnormal lymph node and stable benign calcifications, biopsy of the mass grade 2 IDC with DCIS and lymphovascular invasion, lymph node biopsy is positive, ER 95%, PR 40%, Ki-67 20%, HER-2 -1+ by IHC, T1b N1 stage Ib    08/31/2018 Cancer Staging    Staging form: Breast, AJCC 8th Edition - Clinical: Stage IB (cT1b, cN1(f), cM0, G2, ER+, PR+, HER2-) - Signed by Nicholas Lose, MD on 08/31/2018    10/11/2018 Surgery    Left lumpectomy: Grade 2 IDC with DCIS, 1 cm, margins negative, DCIS focally 0.1 cm from superior margin, 2/2 lymph nodes positive, ER 95%, PR 45%, HER-2 negative, Ki-67 20% AXLND: 3/20 LN Pos (total 5/22)    10/18/2018 Cancer Staging    Staging form: Breast, AJCC 8th Edition - Pathologic stage from 10/18/2018: Stage IB (pT1b, pN2a, cM0, G2, ER+, PR+, HER2-) - Signed by Nicholas Lose, MD on 11/14/2018     Observations/Objective:  There were no vitals filed for this visit. There is  no height or weight on file to calculate BMI.  I have reviewed the data as listed CMP Latest Ref Rng & Units 10/05/2018 08/31/2018 08/04/2018  Glucose 70 - 99 mg/dL 103(H) 117(H) 79  BUN 6 - 20 mg/dL 9 18 16   Creatinine 0.44 - 1.00 mg/dL 0.71 0.84 0.66  Sodium 135 - 145 mmol/L 140 143 140  Potassium 3.5 - 5.1 mmol/L 4.0 4.8 4.0  Chloride 98 - 111 mmol/L 107 106 104  CO2 22 - 32 mmol/L 25 27 28   Calcium 8.9 - 10.3 mg/dL 8.6(L) 9.7 9.1  Total Protein 6.5 - 8.1 g/dL 6.5 7.7 7.0  Total Bilirubin 0.3 - 1.2 mg/dL 1.7(H) 1.4(H) 0.8  Alkaline Phos 38 - 126 U/L 56 81 72  AST 15 - 41 U/L 16 12(L) 11  ALT 0 - 44 U/L 15 18 10     Lab Results  Component Value Date   WBC 9.9 10/05/2018   HGB 12.9 10/05/2018   HCT 41.5 10/05/2018   MCV 84.5 10/05/2018   PLT 232 10/05/2018   NEUTROABS 6.8 10/05/2018      Assessment Plan:  Malignant neoplasm of upper-outer quadrant of left breast in female, estrogen receptor positive ([place]) Left lumpectomy: Grade 2 IDC with DCIS, 1 cm, margins negative, DCIS focally 0.1 cm from superior margin, 2/2 lymph nodes positive, ER 95%, PR 45%, HER-2 negative, Ki-67 20% AXLND: 3/20 LN Pos (total 5/22)  Treatment Plan: 1. Adj chemo with DD Adriamycin and Cytoxan followed by Taxol weekly X 12  2. Adj XRT 3. Adj Anti estrogen therapy --------------------------------------------------------------------------------------------- Chemotherapy Counseling: I discussed the risks and benefits of chemotherapy including the risks of nausea/ vomiting, risk of infection from low WBC count, fatigue due to chemo or anemia, bruising or bleeding due to low platelets, mouth sores, loss/ change in taste and decreased appetite. Liver and kidney function will be monitored through out chemotherapy as abnormalities in liver and kidney function may be a side effect of treatment. Cardiac dysfunction due to Adriamycin was discussed in detail. Risk of permanent bone marrow dysfunction and leukemia due  to chemo were also discussed.  Plan: 1. Port 2. ECHO 3. Chemo class  Start chemo in 4 weeks. I sent a message to [facility].  I discussed the assessment and treatment plan with the patient. The patient was provided an opportunity to ask questions and all were answered. The patient agreed with the plan and demonstrated an understanding of the instructions. The patient was advised to call back or seek an in-person evaluation if the symptoms worsen or if the condition fails to improve as anticipated.   I provided 30 minutes of face-to-face Web Ex time during this encounter.   Kayla Ohara, MD  11/14/2018

## 2018-11-14 NOTE — Assessment & Plan Note (Signed)
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 Adj chemo with DD Adriamycin and Cytoxan followed by Taxol weekly X 12 2. Adj XRT 3. Adj Anti estrogen therapy --------------------------------------------------------------------------------------------- Chemotherapy Counseling: I discussed the risks and benefits of chemotherapy including the risks of nausea/ vomiting, risk of infection from low WBC count, fatigue due to chemo or anemia, bruising or bleeding due to low platelets, mouth sores, loss/ change in taste and decreased appetite. Liver and kidney function will be monitored through out chemotherapy as abnormalities in liver and kidney function may be a side effect of treatment. Cardiac dysfunction due to Adriamycin was discussed in detail. Risk of permanent bone marrow dysfunction and leukemia due to chemo were also discussed.  Plan: 1. Port 2. ECHO 3. Chemo class  Start chemo in 3 weeks.

## 2018-11-15 ENCOUNTER — Ambulatory Visit: Payer: Self-pay | Admitting: Surgery

## 2018-11-15 ENCOUNTER — Telehealth: Payer: Self-pay | Admitting: Hematology and Oncology

## 2018-11-15 NOTE — Telephone Encounter (Signed)
Tried Biomedical engineer was full

## 2018-11-22 ENCOUNTER — Encounter (HOSPITAL_BASED_OUTPATIENT_CLINIC_OR_DEPARTMENT_OTHER): Payer: Self-pay | Admitting: *Deleted

## 2018-11-22 ENCOUNTER — Other Ambulatory Visit: Payer: Self-pay

## 2018-11-23 ENCOUNTER — Encounter (HOSPITAL_BASED_OUTPATIENT_CLINIC_OR_DEPARTMENT_OTHER)
Admission: RE | Admit: 2018-11-23 | Discharge: 2018-11-23 | Disposition: A | Discharge status: Home / Self Care | Payer: BC Managed Care – PPO | Source: Ambulatory Visit | Attending: Surgery | Admitting: Surgery

## 2018-11-23 DIAGNOSIS — Z01812 Encounter for preprocedural laboratory examination: Secondary | ICD-10-CM | POA: Insufficient documentation

## 2018-11-23 LAB — CBC WITH DIFFERENTIAL/PLATELET
Abs Immature Granulocytes: 0.03 10*3/uL (ref 0.00–0.07)
Basophils Absolute: 0 10*3/uL (ref 0.0–0.1)
Basophils Relative: 0 %
Eosinophils Absolute: 0.3 10*3/uL (ref 0.0–0.5)
Eosinophils Relative: 3 %
HCT: 42.2 % (ref 36.0–46.0)
Hemoglobin: 13.2 g/dL (ref 12.0–15.0)
Immature Granulocytes: 0 %
Lymphocytes Relative: 20 %
Lymphs Abs: 1.9 10*3/uL (ref 0.7–4.0)
MCH: 26.8 pg (ref 26.0–34.0)
MCHC: 31.3 g/dL (ref 30.0–36.0)
MCV: 85.8 fL (ref 80.0–100.0)
Monocytes Absolute: 0.7 10*3/uL (ref 0.1–1.0)
Monocytes Relative: 7 %
Neutro Abs: 6.4 10*3/uL (ref 1.7–7.7)
Neutrophils Relative %: 70 %
Platelets: 305 10*3/uL (ref 150–400)
RBC: 4.92 MIL/uL (ref 3.87–5.11)
RDW: 15 % (ref 11.5–15.5)
WBC: 9.3 10*3/uL (ref 4.0–10.5)
nRBC: 0 % (ref 0.0–0.2)

## 2018-11-23 LAB — COMPREHENSIVE METABOLIC PANEL
ALT: 14 U/L (ref 0–44)
AST: 17 U/L (ref 15–41)
Albumin: 3.4 g/dL — ABNORMAL LOW (ref 3.5–5.0)
Alkaline Phosphatase: 70 U/L (ref 38–126)
Anion gap: 10 (ref 5–15)
BUN: 11 mg/dL (ref 6–20)
CO2: 28 mmol/L (ref 22–32)
Calcium: 9.2 mg/dL (ref 8.9–10.3)
Chloride: 103 mmol/L (ref 98–111)
Creatinine, Ser: 0.69 mg/dL (ref 0.44–1.00)
GFR calc Af Amer: >60 mL/min (ref >60)
GFR calc non Af Amer: >60 mL/min (ref >60)
Glucose, Bld: 88 mg/dL (ref 70–99)
Potassium: 4.6 mmol/L (ref 3.5–5.1)
Sodium: 141 mmol/L (ref 135–145)
Total Bilirubin: 0.9 mg/dL (ref 0.3–1.2)
Total Protein: 6.7 g/dL (ref 6.5–8.1)

## 2018-11-23 NOTE — Progress Notes (Signed)
Ensure pre surgery drink given with instructions to complete by [facility], pt verbalized understanding.

## 2018-12-01 ENCOUNTER — Encounter (HOSPITAL_BASED_OUTPATIENT_CLINIC_OR_DEPARTMENT_OTHER)
Admission: RE | Disposition: A | Discharge status: Home / Self Care | Payer: Self-pay | Source: Home / Self Care | Attending: Surgery

## 2018-12-01 ENCOUNTER — Ambulatory Visit (HOSPITAL_BASED_OUTPATIENT_CLINIC_OR_DEPARTMENT_OTHER): Payer: BC Managed Care – PPO | Admitting: Anesthesiology

## 2018-12-01 ENCOUNTER — Other Ambulatory Visit: Payer: Self-pay

## 2018-12-01 ENCOUNTER — Ambulatory Visit (HOSPITAL_COMMUNITY): Payer: BC Managed Care – PPO

## 2018-12-01 ENCOUNTER — Encounter (HOSPITAL_BASED_OUTPATIENT_CLINIC_OR_DEPARTMENT_OTHER): Payer: Self-pay | Admitting: Emergency Medicine

## 2018-12-01 ENCOUNTER — Ambulatory Visit (HOSPITAL_BASED_OUTPATIENT_CLINIC_OR_DEPARTMENT_OTHER)
Admission: RE | Admit: 2018-12-01 | Discharge: 2018-12-01 | Disposition: A | Discharge status: Home / Self Care | Payer: BC Managed Care – PPO | Attending: Surgery | Admitting: Surgery

## 2018-12-01 DIAGNOSIS — Z8249 Family history of ischemic heart disease and other diseases of the circulatory system: Secondary | ICD-10-CM | POA: Diagnosis not present

## 2018-12-01 DIAGNOSIS — Z880 Allergy status to penicillin: Secondary | ICD-10-CM | POA: Diagnosis not present

## 2018-12-01 DIAGNOSIS — Z881 Allergy status to other antibiotic agents status: Secondary | ICD-10-CM | POA: Insufficient documentation

## 2018-12-01 DIAGNOSIS — Z803 Family history of malignant neoplasm of breast: Secondary | ICD-10-CM | POA: Insufficient documentation

## 2018-12-01 DIAGNOSIS — Z79899 Other long term (current) drug therapy: Secondary | ICD-10-CM | POA: Insufficient documentation

## 2018-12-01 DIAGNOSIS — C50912 Malignant neoplasm of unspecified site of left female breast: Secondary | ICD-10-CM | POA: Diagnosis present

## 2018-12-01 DIAGNOSIS — E039 Hypothyroidism, unspecified: Secondary | ICD-10-CM | POA: Diagnosis not present

## 2018-12-01 DIAGNOSIS — Z791 Long term (current) use of non-steroidal anti-inflammatories (NSAID): Secondary | ICD-10-CM | POA: Insufficient documentation

## 2018-12-01 DIAGNOSIS — Z888 Allergy status to other drugs, medicaments and biological substances status: Secondary | ICD-10-CM | POA: Insufficient documentation

## 2018-12-01 DIAGNOSIS — Z79890 Hormone replacement therapy (postmenopausal): Secondary | ICD-10-CM | POA: Diagnosis not present

## 2018-12-01 DIAGNOSIS — I10 Essential (primary) hypertension: Secondary | ICD-10-CM | POA: Insufficient documentation

## 2018-12-01 DIAGNOSIS — Z95828 Presence of other vascular implants and grafts: Secondary | ICD-10-CM

## 2018-12-01 HISTORY — DX: Nausea with vomiting, unspecified: R11.2

## 2018-12-01 HISTORY — DX: Adverse effect of unspecified anesthetic, initial encounter: T41.45XA

## 2018-12-01 HISTORY — PX: PORTACATH PLACEMENT: SHX2246

## 2018-12-01 HISTORY — DX: Other specified postprocedural states: Z98.890

## 2018-12-01 HISTORY — DX: Other complications of anesthesia, initial encounter: T88.59XA

## 2018-12-01 SURGERY — INSERTION, TUNNELED CENTRAL VENOUS DEVICE, WITH PORT
Anesthesia: General | Site: Chest | Laterality: Right

## 2018-12-01 MED ORDER — HEPARIN SOD (PORK) LOCK FLUSH 100 UNIT/ML IV SOLN
INTRAVENOUS | Status: DC | PRN
  Administered 2018-12-01: 500 [IU]

## 2018-12-01 MED ORDER — OXYCODONE HCL 5 MG/5ML PO SOLN: 5.0000 mg | Freq: Once | ORAL | Status: DC | PRN

## 2018-12-01 MED ORDER — SCOPOLAMINE 1 MG/3DAYS TD PT72
MEDICATED_PATCH | TRANSDERMAL | Status: AC
  Filled 2018-12-01: qty 1

## 2018-12-01 MED ORDER — LACTATED RINGERS IV SOLN
INTRAVENOUS | Status: DC
  Administered 2018-12-01: 11:00:00 via INTRAVENOUS

## 2018-12-01 MED ORDER — SCOPOLAMINE 1 MG/3DAYS TD PT72
1.0000 | MEDICATED_PATCH | Freq: Once | TRANSDERMAL | Status: DC | PRN
  Administered 2018-12-01: 1.5 mg via TRANSDERMAL

## 2018-12-01 MED ORDER — ACETAMINOPHEN 500 MG PO TABS
ORAL_TABLET | ORAL | Status: AC
  Filled 2018-12-01: qty 2

## 2018-12-01 MED ORDER — PROPOFOL 10 MG/ML IV BOLUS
INTRAVENOUS | Status: AC
  Filled 2018-12-01: qty 40

## 2018-12-01 MED ORDER — HEPARIN SOD (PORK) LOCK FLUSH 100 UNIT/ML IV SOLN
INTRAVENOUS | Status: AC
  Filled 2018-12-01: qty 5

## 2018-12-01 MED ORDER — EPHEDRINE SULFATE 50 MG/ML IJ SOLN
INTRAMUSCULAR | Status: DC | PRN
  Administered 2018-12-01 (×4): 15 mg via INTRAVENOUS

## 2018-12-01 MED ORDER — CHLORHEXIDINE GLUCONATE CLOTH 2 % EX PADS: 6.0000 | MEDICATED_PAD | Freq: Once | CUTANEOUS | Status: DC

## 2018-12-01 MED ORDER — FENTANYL CITRATE (PF) 100 MCG/2ML IJ SOLN
50.0000 ug | INTRAMUSCULAR | Status: DC | PRN
  Administered 2018-12-01: 50 ug via INTRAVENOUS

## 2018-12-01 MED ORDER — DEXAMETHASONE SODIUM PHOSPHATE 10 MG/ML IJ SOLN
INTRAMUSCULAR | Status: DC | PRN
  Administered 2018-12-01: 5 mg via INTRAVENOUS

## 2018-12-01 MED ORDER — PROPOFOL 10 MG/ML IV BOLUS
INTRAVENOUS | Status: DC | PRN
  Administered 2018-12-01: 200 mg via INTRAVENOUS

## 2018-12-01 MED ORDER — GABAPENTIN 300 MG PO CAPS
ORAL_CAPSULE | ORAL | Status: AC
  Filled 2018-12-01: qty 1

## 2018-12-01 MED ORDER — HEPARIN (PORCINE) IN NACL 2-0.9 UNITS/ML
INTRAMUSCULAR | Status: AC | PRN
  Administered 2018-12-01: 1

## 2018-12-01 MED ORDER — OXYCODONE HCL 5 MG PO TABS: 5.0000 mg | ORAL_TABLET | Freq: Once | ORAL | Status: DC | PRN

## 2018-12-01 MED ORDER — FENTANYL CITRATE (PF) 100 MCG/2ML IJ SOLN
INTRAMUSCULAR | Status: AC
  Filled 2018-12-01: qty 2

## 2018-12-01 MED ORDER — BUPIVACAINE HCL (PF) 0.25 % IJ SOLN
INTRAMUSCULAR | Status: AC
  Filled 2018-12-01: qty 30

## 2018-12-01 MED ORDER — LIDOCAINE 2% (20 MG/ML) 5 ML SYRINGE
INTRAMUSCULAR | Status: AC
  Filled 2018-12-01: qty 5

## 2018-12-01 MED ORDER — ONDANSETRON HCL 4 MG/2ML IJ SOLN
INTRAMUSCULAR | Status: DC | PRN
  Administered 2018-12-01: 4 mg via INTRAVENOUS

## 2018-12-01 MED ORDER — ACETAMINOPHEN 500 MG PO TABS
1000.0000 mg | ORAL_TABLET | ORAL | Status: AC
  Administered 2018-12-01: 1000 mg via ORAL

## 2018-12-01 MED ORDER — PROMETHAZINE HCL 25 MG/ML IJ SOLN: 6.2500 mg | INTRAMUSCULAR | Status: DC | PRN

## 2018-12-01 MED ORDER — DEXAMETHASONE SODIUM PHOSPHATE 10 MG/ML IJ SOLN
INTRAMUSCULAR | Status: AC
  Filled 2018-12-01: qty 1

## 2018-12-01 MED ORDER — FENTANYL CITRATE (PF) 100 MCG/2ML IJ SOLN: 25.0000 ug | INTRAMUSCULAR | Status: DC | PRN

## 2018-12-01 MED ORDER — CLINDAMYCIN PHOSPHATE 900 MG/50ML IV SOLN
INTRAVENOUS | Status: AC
  Filled 2018-12-01: qty 50

## 2018-12-01 MED ORDER — BUPIVACAINE-EPINEPHRINE 0.25% -1:200000 IJ SOLN
INTRAMUSCULAR | Status: DC | PRN
  Administered 2018-12-01: 17 mL

## 2018-12-01 MED ORDER — GABAPENTIN 300 MG PO CAPS
300.0000 mg | ORAL_CAPSULE | ORAL | Status: AC
  Administered 2018-12-01: 300 mg via ORAL

## 2018-12-01 MED ORDER — ONDANSETRON HCL 4 MG/2ML IJ SOLN
INTRAMUSCULAR | Status: AC
  Filled 2018-12-01: qty 2

## 2018-12-01 MED ORDER — CLINDAMYCIN PHOSPHATE 900 MG/50ML IV SOLN
900.0000 mg | INTRAVENOUS | Status: AC
  Administered 2018-12-01: 900 mg via INTRAVENOUS

## 2018-12-01 MED ORDER — LIDOCAINE HCL (CARDIAC) PF 100 MG/5ML IV SOSY
PREFILLED_SYRINGE | INTRAVENOUS | Status: DC | PRN
  Administered 2018-12-01: 100 mg via INTRAVENOUS

## 2018-12-01 MED ORDER — MIDAZOLAM HCL 2 MG/2ML IJ SOLN: 1.0000 mg | INTRAMUSCULAR | Status: DC | PRN

## 2018-12-01 SURGICAL SUPPLY — 61 items
ADH SKN CLS APL DERMABOND .7 (GAUZE/BANDAGES/DRESSINGS) ×1
APL SKNCLS STERI-STRIP NONHPOA (GAUZE/BANDAGES/DRESSINGS)
BAG DECANTER FOR FLEXI CONT (MISCELLANEOUS) ×3 IMPLANT
BENZOIN TINCTURE PRP APPL 2/3 (GAUZE/BANDAGES/DRESSINGS) IMPLANT
BLADE HEX COATED 2.75 (ELECTRODE) ×3 IMPLANT
BLADE SURG 11 STRL SS (BLADE) ×3 IMPLANT
BLADE SURG 15 STRL LF DISP TIS (BLADE) ×1 IMPLANT
BLADE SURG 15 STRL SS (BLADE) ×2
CANISTER SUCT 1200ML W/VALVE (MISCELLANEOUS) IMPLANT
CHLORAPREP W/TINT 26 (MISCELLANEOUS) ×3 IMPLANT
CLOSURE WOUND 1/2 X4 (GAUZE/BANDAGES/DRESSINGS)
COVER BACK TABLE REUSABLE LG (DRAPES) ×3 IMPLANT
COVER MAYO STAND REUSABLE (DRAPES) ×3 IMPLANT
COVER PROBE 5X48 (MISCELLANEOUS) ×2
COVER WAND RF STERILE (DRAPES) IMPLANT
DECANTER SPIKE VIAL GLASS SM (MISCELLANEOUS) IMPLANT
DERMABOND ADVANCED (GAUZE/BANDAGES/DRESSINGS) ×2
DERMABOND ADVANCED .7 DNX12 (GAUZE/BANDAGES/DRESSINGS) ×1 IMPLANT
DRAPE C-ARM 42X72 X-RAY (DRAPES) ×3 IMPLANT
DRAPE LAPAROSCOPIC ABDOMINAL (DRAPES) ×3 IMPLANT
DRAPE UTILITY XL STRL (DRAPES) ×3 IMPLANT
DRSG TEGADERM 2-3/8X2-3/4 SM (GAUZE/BANDAGES/DRESSINGS) IMPLANT
DRSG TEGADERM 4X4.75 (GAUZE/BANDAGES/DRESSINGS) IMPLANT
ELECT REM PT RETURN 9FT ADLT (ELECTROSURGICAL) ×3
ELECTRODE REM PT RTRN 9FT ADLT (ELECTROSURGICAL) ×1 IMPLANT
GAUZE SPONGE 4X4 12PLY STRL LF (GAUZE/BANDAGES/DRESSINGS) IMPLANT
GLOVE BIOGEL PI IND STRL 8 (GLOVE) ×1 IMPLANT
GLOVE BIOGEL PI INDICATOR 8 (GLOVE) ×2
GLOVE ECLIPSE 8.0 STRL XLNG CF (GLOVE) ×3 IMPLANT
GOWN STRL REUS W/ TWL LRG LVL3 (GOWN DISPOSABLE) ×2 IMPLANT
GOWN STRL REUS W/TWL LRG LVL3 (GOWN DISPOSABLE) ×4
IV KIT MINILOC 20X1 SAFETY (NEEDLE) IMPLANT
KIT CVR 48X5XPRB PLUP LF (MISCELLANEOUS) ×1 IMPLANT
KIT PORT POWER 8FR ISP CVUE (Port) ×2 IMPLANT
NDL HYPO 25X1 1.5 SAFETY (NEEDLE) ×1 IMPLANT
NDL SAFETY ECLIPSE 18X1.5 (NEEDLE) IMPLANT
NDL SPNL 22GX3.5 QUINCKE BK (NEEDLE) IMPLANT
NEEDLE HYPO 18GX1.5 SHARP (NEEDLE)
NEEDLE HYPO 22GX1.5 SAFETY (NEEDLE) IMPLANT
NEEDLE HYPO 25X1 1.5 SAFETY (NEEDLE) ×3 IMPLANT
NEEDLE SPNL 22GX3.5 QUINCKE BK (NEEDLE) IMPLANT
PACK BASIN DAY SURGERY FS (CUSTOM PROCEDURE TRAY) ×3 IMPLANT
PENCIL BUTTON HOLSTER BLD 10FT (ELECTRODE) ×3 IMPLANT
SET SHEATH INTRODUCER 10FR (MISCELLANEOUS) IMPLANT
SHEATH COOK PEEL AWAY SET 9F (SHEATH) IMPLANT
SLEEVE SCD COMPRESS KNEE MED (MISCELLANEOUS) ×3 IMPLANT
SLEEVE SURGEON STRL (DRAPES) ×2 IMPLANT
SPONGE LAP 4X18 RFD (DISPOSABLE) IMPLANT
STRIP CLOSURE SKIN 1/2X4 (GAUZE/BANDAGES/DRESSINGS) IMPLANT
SUT MON AB 4-0 PC3 18 (SUTURE) ×3 IMPLANT
SUT PROLENE 2 0 CT2 30 (SUTURE) IMPLANT
SUT PROLENE 2 0 SH DA (SUTURE) ×3 IMPLANT
SUT SILK 2 0 TIES 17X18 (SUTURE)
SUT SILK 2-0 18XBRD TIE BLK (SUTURE) IMPLANT
SUT VICRYL 3-0 CR8 SH (SUTURE) ×3 IMPLANT
SYR 5ML LUER SLIP (SYRINGE) ×3 IMPLANT
SYR CONTROL 10ML LL (SYRINGE) ×3 IMPLANT
TOWEL GREEN STERILE FF (TOWEL DISPOSABLE) ×6 IMPLANT
TUBE CONNECTING 20'X1/4 (TUBING)
TUBE CONNECTING 20X1/4 (TUBING) IMPLANT
YANKAUER SUCT BULB TIP NO VENT (SUCTIONS) IMPLANT

## 2018-12-01 NOTE — H&P (Signed)
Kayla Banks is an 63 y.o. female.   Chief Complaint: here for port HPI: Pt presents for port placement for breast cancer and need for chemotherapy   Past Medical History:  Diagnosis Date  . Complication of anesthesia   . Family history of breast cancer   . Heart murmur    no problems, saw cardiologist 2010  . Hypertension    on meds  . Hypothyroidism   . Osteoarthritis   . Plantar fasciitis   . PONV (postoperative nausea and vomiting)   . Thyroid disease    Hypothyroidism    Past Surgical History:  Procedure Laterality Date  . ABDOMINAL HYSTERECTOMY  02/2012  . AXILLARY LYMPH NODE DISSECTION Left 11/09/2018   Procedure: LEFT AXILLARY LYMPH NODE DISSECTION;  Surgeon: Erroll Luna, MD;  Location: [facility];  Service: General;  Laterality: Left;  . BREAST LUMPECTOMY WITH RADIOACTIVE SEED AND SENTINEL LYMPH NODE BIOPSY Left 10/11/2018   Procedure: LEFT BREAST LUMPECTOMY WITH RADIOACTIVE SEED AND LEFT SENTINEL LYMPH NODE MAPPING WITH LEFT TARGETED LYMPH NODE BIOPSY;  Surgeon: Erroll Luna, MD;  Location: [place] [facility];  Service: General;  Laterality: Left;  . FOOT SURGERY Left    bone spurs  . LUMBAR LAMINECTOMY  1990    Family History  Problem Relation Age of Onset  . Diabetes Mother   . Hyperlipidemia Mother   . Hypertension Mother   . Hypertension Father   . Heart disease Father 11       ?MI  . Hypothyroidism Sister   . Hyperthyroidism Sister   . Breast cancer Maternal Aunt        dx 60's/70's  . Breast cancer Other        dx early 31's   Social History:  reports that she has never smoked. She has never used smokeless tobacco. She reports current alcohol use. She reports that she does not use drugs.  Allergies:  Allergies  Allergen Reactions  . Adhesive [Tape]   . Erythromycin Swelling    Lip swelling; angioedema  . Lisinopril Swelling    REACTION: angioedema  . Penicillins Hives and Swelling    Swelling in arms & hands     Medications Prior to Admission  Medication Sig Dispense Refill  . amLODipine (NORVASC) 5 MG tablet Take 1 tablet (5 mg total) by mouth daily. 90 tablet 1  . carvedilol (COREG) 25 MG tablet TAKE 1 TABLET(25 MG) BY MOUTH TWICE DAILY WITH A MEAL 180 tablet 1  . furosemide (LASIX) 20 MG tablet TAKE 1 TABLET(20 MG) BY MOUTH DAILY 90 tablet 1  . HYDROcodone-acetaminophen (NORCO/VICODIN) 5-325 MG tablet Take 1 tablet by mouth every 6 (six) hours as needed for moderate pain. 12 tablet 0  . meloxicam (MOBIC) 15 MG tablet TAKE 1 TABLET(15 MG) BY MOUTH DAILY AS NEEDED FOR PAIN 90 tablet 3  . SYNTHROID 100 MCG tablet 1 po qd 90 tablet 3  . valACYclovir (VALTREX) 1000 MG tablet TAKE 1 TABLET BY MOUTH THREE TIMES DAILY, DISCONTINUE ALL PREVIOUS REFILLS FOR THIS MEDICATION 30 tablet 5  . dexamethasone (DECADRON) 4 MG tablet Take1 tab day after chemo and 1 tab 2 days after chemo with food 8 tablet 0  . EPINEPHRINE 0.3 mg/0.3 mL IJ SOAJ injection INJECT 0.3 ML(1 SYRINGE) IN THE MUSCLE AS NEEDED FOR ALLERGIC REACTION 2 Device 0  . lidocaine-prilocaine (EMLA) cream Apply to affected area once 30 g 3  . LORazepam (ATIVAN) 0.5 MG tablet Take 1 tablet (0.5 mg total)  by mouth at bedtime as needed for sleep. 30 tablet 0  . ondansetron (ZOFRAN) 8 MG tablet Take 1 tablet (8 mg total) by mouth 2 (two) times daily as needed. Start on the third day after chemotherapy. 30 tablet 1  . prochlorperazine (COMPAZINE) 10 MG tablet Take 1 tablet (10 mg total) by mouth every 6 (six) hours as needed (Nausea or vomiting). 30 tablet 1    No results found for this or any previous visit (from the past 48 hour(s)). No results found.  Review of Systems  All other systems reviewed and are negative.   Blood pressure (!) 167/78, pulse 69, temperature 98.5 F (36.9 C), temperature source Oral, resp. rate 16, height 5\' 6"  (1.676 m), weight 121 kg, last menstrual period 01/28/2012, SpO2 99 %. Physical Exam  Constitutional: She  appears well-developed.  Eyes: Pupils are equal, round, and reactive to light.  Cardiovascular: Normal rate.  Respiratory:  Left breast incisions healing drain in place   Musculoskeletal: Normal range of motion.     Assessment/Plan Left breast cancer  Poor venous acces  Port placement today   Risk  of bleeding infection collapse lung heart injury lung injury injury to structures in the neck blood clots  Stroke    Turner Daniels, MD 12/01/2018, 10:35 AM

## 2018-12-01 NOTE — Discharge Instructions (Signed)

## 2018-12-01 NOTE — Anesthesia Preprocedure Evaluation (Signed)
Anesthesia Evaluation  Patient identified by MRN, date of birth, ID band Patient awake    Reviewed: Allergy & Precautions, NPO status , Patient's Chart, lab work & pertinent test results  History of Anesthesia Complications (+) PONV  Airway Mallampati: II  TM Distance: >3 FB Neck ROM: Full    Dental no notable dental hx.    Pulmonary neg pulmonary ROS,    Pulmonary exam normal breath sounds clear to auscultation       Cardiovascular hypertension, Normal cardiovascular exam Rhythm:Regular Rate:Normal     Neuro/Psych negative neurological ROS  negative psych ROS   GI/Hepatic negative GI ROS, Neg liver ROS,   Endo/Other  Hypothyroidism   Renal/GU negative Renal ROS  negative genitourinary   Musculoskeletal negative musculoskeletal ROS (+)   Abdominal   Peds negative pediatric ROS (+)  Hematology negative hematology ROS (+)   Anesthesia Other Findings   Reproductive/Obstetrics negative OB ROS                             Anesthesia Physical Anesthesia Plan  ASA: II  Anesthesia Plan: General   Post-op Pain Management:    Induction: Intravenous  PONV Risk Score and Plan: 4 or greater and Ondansetron, Dexamethasone, Scopolamine patch - Pre-op and Treatment may vary due to age or medical condition  Airway Management Planned: LMA and Oral ETT  Additional Equipment:   Intra-op Plan:   Post-operative Plan: Extubation in OR  Informed Consent: I have reviewed the patients History and Physical, chart, labs and discussed the procedure including the risks, benefits and alternatives for the proposed anesthesia with the patient or authorized representative who has indicated his/her understanding and acceptance.     Dental advisory given  Plan Discussed with: CRNA and Surgeon  Anesthesia Plan Comments:         Anesthesia Quick Evaluation

## 2018-12-01 NOTE — Op Note (Signed)
Preoperative diagnosis: PAC needed for chemotherapy / poor venous access  Postoperative diagnosis: Same  Procedure: Portacath Placement with C arm and U/S guidance   Surgeon: Turner Daniels, MD, FACS  Anesthesia: General and 0.25 % marcaine with epinephrine  Clinical History and Indications: The patient is getting ready to begin chemotherapy for her cancer. She  needs a Port-A-Cath for venous access. Risk of bleeding, infection,  Collapse lung,  Death,  DVT,  Organ injury,  Mediastinal injury,  Injury to heart,  Injury to blood vessels,  Nerves,  Migration of catheter,  Embolization of catheter and the need for more surgery.  Description of Procedure: I have seen the patient in the holding area and confirmed the plans for the procedure as noted above. I reviewed the risks and complications again and the patient has no further questions. She wishes to proceed.   The patient was then taken to the operating room. After satisfactory general  anesthesia had been obtained the upper chest and lower neck were prepped and draped as a sterile field. The timeout was done.  The right internal jugular vein  was entered under U/S guidance  and the guidewire threaded into the superior vena cava right atrial area under fluoroscopic guidance. An incision was then made on the anterior chest wall and a subcutaneous pocket fashioned for the port reservoir.  The port tubing was then brought through a subcutaneous tunnel from the port site to the guidewire site.  The port and catheter were attached, locked  and flushed. The catheter was measured and cut to appropriate length.The dilator and peel-away sheath were then advanced over the guidewire while monitoring this with fluoroscopy. The guidewire and dilator were removed and the tubing threaded to approximately 21 cm. The peel-away sheath was then removed. The catheter aspirated and flushed easily. Using fluoroscopy the tip was in the superior vena cava right atrial  junction area. It aspirated and flushed easily. That aspirated and flushed easily.  The reservoir was secured to the fascia with 1 sutures of 2-0 Prolene. A final check with fluoroscopy was done to make sure we had no kinks and good positioning of the tip of the catheter. Everything appeared to be okay. The catheter was aspirated, flushed with dilute heparin and then concentrated aqueous heparin.  The incision was then closed with interrupted 3-0 Vicryl, and 4-0 Monocryl subcuticular with Dermabond on the skin.  There were no operative complications. Estimated blood loss was minimal. All counts were correct. The patient tolerated the procedure well.  Turner Daniels, MD, FACS

## 2018-12-01 NOTE — Anesthesia Postprocedure Evaluation (Signed)
Anesthesia Post Note  Patient: Production manager  Procedure(s) Performed: INSERTION PORT-A-CATH WITH ULTRASOUND (Right Chest)     Patient location during evaluation: PACU Anesthesia Type: General Level of consciousness: awake and alert Pain management: pain level controlled Vital Signs Assessment: post-procedure vital signs reviewed and stable Respiratory status: spontaneous breathing, nonlabored ventilation, respiratory function stable and patient connected to nasal cannula oxygen Cardiovascular status: blood pressure returned to baseline and stable Postop Assessment: no apparent nausea or vomiting Anesthetic complications: no    Last Vitals:  Vitals:   12/01/18 1421 12/01/18 1430  BP: 135/69 133/75  Pulse: 67 68  Resp: 13 13  Temp: 36.4 C   SpO2: 100% 100%    Last Pain:  Vitals:   12/01/18 1030  TempSrc: Oral                 Suzy Kugel S

## 2018-12-01 NOTE — Anesthesia Procedure Notes (Signed)
Procedure Name: LMA Insertion Performed by: Verita Lamb, CRNA Pre-anesthesia Checklist: Patient identified, Emergency Drugs available, Suction available, Patient being monitored and Timeout performed Patient Re-evaluated:Patient Re-evaluated prior to induction Oxygen Delivery Method: Circle system utilized Preoxygenation: Pre-oxygenation with 100% oxygen Induction Type: IV induction LMA: LMA inserted LMA Size: 4.0 Number of attempts: 1 Placement Confirmation: positive ETCO2,  CO2 detector and breath sounds checked- equal and bilateral Tube secured with: Tape Dental Injury: Teeth and Oropharynx as per pre-operative assessment

## 2018-12-01 NOTE — Transfer of Care (Signed)
Immediate Anesthesia Transfer of Care Note  Patient: Kayla Banks  Procedure(s) Performed: INSERTION PORT-A-CATH WITH ULTRASOUND (Right Chest)  Patient Location: PACU  Anesthesia Type:General  Level of Consciousness: awake, alert  and oriented  Airway & Oxygen Therapy: Patient Spontanous Breathing and Patient connected to face mask oxygen  Post-op Assessment: Report given to RN and Post -op Vital signs reviewed and stable  Post vital signs: reviewed and stable  Last Vitals:  Vitals Value Taken Time  BP 135/69 12/01/2018  2:21 PM  Temp    Pulse 65 12/01/2018  2:22 PM  Resp 14 12/01/2018  2:22 PM  SpO2 100 % 12/01/2018  2:22 PM  Vitals shown include unvalidated device data.  Last Pain:  Vitals:   12/01/18 1030  TempSrc: Oral      Patients Stated Pain Goal: 4 (47/09/62 [id number])  Complications: No apparent anesthesia complications

## 2018-12-01 NOTE — Interval H&P Note (Signed)
History and Physical Interval Note:  12/01/2018 10:38 AM  Kayla Banks  has presented today for surgery, with the diagnosis of POOR VENOUS ACCESS.  The various methods of treatment have been discussed with the patient and family. After consideration of risks, benefits and other options for treatment, the patient has consented to  Procedure(s): INSERTION PORT-A-CATH WITH ULTRASOUND (N/A) as a surgical intervention.  The patient's history has been reviewed, patient examined, no change in status, stable for surgery.  I have reviewed the patient's chart and labs.  Questions were answered to the patient's satisfaction.     [place]

## 2018-12-02 ENCOUNTER — Encounter (HOSPITAL_BASED_OUTPATIENT_CLINIC_OR_DEPARTMENT_OTHER): Payer: Self-pay | Admitting: Surgery

## 2018-12-12 ENCOUNTER — Other Ambulatory Visit (HOSPITAL_COMMUNITY): Payer: BC Managed Care – PPO

## 2018-12-12 ENCOUNTER — Inpatient Hospital Stay: Payer: BC Managed Care – PPO | Attending: Hematology and Oncology

## 2018-12-12 ENCOUNTER — Other Ambulatory Visit: Payer: Self-pay

## 2018-12-12 ENCOUNTER — Telehealth: Payer: Self-pay | Admitting: *Deleted

## 2018-12-12 ENCOUNTER — Ambulatory Visit (HOSPITAL_COMMUNITY)
Admission: RE | Admit: 2018-12-12 | Discharge: 2018-12-12 | Disposition: A | Discharge status: Home / Self Care | Payer: BC Managed Care – PPO | Source: Ambulatory Visit | Attending: Hematology and Oncology | Admitting: Hematology and Oncology

## 2018-12-12 DIAGNOSIS — Z5111 Encounter for antineoplastic chemotherapy: Secondary | ICD-10-CM | POA: Insufficient documentation

## 2018-12-12 DIAGNOSIS — C50412 Malignant neoplasm of upper-outer quadrant of left female breast: Secondary | ICD-10-CM | POA: Diagnosis present

## 2018-12-12 DIAGNOSIS — Z923 Personal history of irradiation: Secondary | ICD-10-CM | POA: Insufficient documentation

## 2018-12-12 DIAGNOSIS — E669 Obesity, unspecified: Secondary | ICD-10-CM | POA: Diagnosis not present

## 2018-12-12 DIAGNOSIS — Z791 Long term (current) use of non-steroidal anti-inflammatories (NSAID): Secondary | ICD-10-CM | POA: Insufficient documentation

## 2018-12-12 DIAGNOSIS — E785 Hyperlipidemia, unspecified: Secondary | ICD-10-CM | POA: Diagnosis not present

## 2018-12-12 DIAGNOSIS — I10 Essential (primary) hypertension: Secondary | ICD-10-CM | POA: Insufficient documentation

## 2018-12-12 DIAGNOSIS — Z9221 Personal history of antineoplastic chemotherapy: Secondary | ICD-10-CM | POA: Insufficient documentation

## 2018-12-12 DIAGNOSIS — Z79899 Other long term (current) drug therapy: Secondary | ICD-10-CM | POA: Insufficient documentation

## 2018-12-12 DIAGNOSIS — Z170 Estrogen receptor positive status [ER+]: Secondary | ICD-10-CM | POA: Insufficient documentation

## 2018-12-12 NOTE — Assessment & Plan Note (Signed)
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 Adj chemo with DD Adriamycin and Cytoxan followed by Taxol weekly X 12 2. Adj XRT 3. Adj Anti estrogen therapy --------------------------------------------------------------------------------------------- Current treatment: Cycle 1 day 1 dose dense Adriamycin and Cytoxan Echocardiogram Labs reviewed Chemo consent obtained Chemo education completed  Return to clinic in 1 week for toxicity check

## 2018-12-12 NOTE — Progress Notes (Signed)
  Echocardiogram 2D Echocardiogram has been performed.  Kieley Akter L Androw 12/12/2018, 3:45 PM

## 2018-12-13 ENCOUNTER — Encounter: Payer: Self-pay | Admitting: Pharmacist

## 2018-12-14 NOTE — Progress Notes (Signed)
Patient Care Team: Carollee Herter, Alferd Apa, DO as PCP - General Dorna Leitz, MD as Consulting Physician (Orthopedic Surgery) Erroll Luna, MD as Consulting Physician (General Surgery) Nicholas Lose, MD as Consulting Physician (Hematology and Oncology) Kyung Rudd, MD as Consulting Physician (Radiation Oncology)  DIAGNOSIS:    ICD-10-CM   1. Malignant neoplasm of upper-outer quadrant of left breast in female, estrogen receptor positive ([name]) C50.412    Z17.0     SUMMARY OF ONCOLOGIC HISTORY:   Malignant neoplasm of upper-outer quadrant of left breast in female, estrogen receptor positive ([place])   08/24/2018 Initial Biopsy    Screening detected left breast mass at 2:30 position 6 mm in size with one abnormal lymph node and stable benign calcifications, biopsy of the mass grade 2 IDC with DCIS and lymphovascular invasion, lymph node biopsy is positive, ER 95%, PR 40%, Ki-67 20%, HER-2 -1+ by IHC, T1b N1 stage Ib    08/31/2018 Cancer Staging    Staging form: Breast, AJCC 8th Edition - Clinical: Stage IB (cT1b, cN1(f), cM0, G2, ER+, PR+, HER2-) - Signed by Nicholas Lose, MD on 08/31/2018    10/11/2018 Surgery    Left lumpectomy: Grade 2 IDC with DCIS, 1 cm, margins negative, DCIS focally 0.1 cm from superior margin, 2/2 lymph nodes positive, ER 95%, PR 45%, HER-2 negative, Ki-67 20% AXLND: 3/20 LN Pos (total 5/22)    10/18/2018 Cancer Staging    Staging form: Breast, AJCC 8th Edition - Pathologic stage from 10/18/2018: Stage IB (pT1b, pN2a, cM0, G2, ER+, PR+, HER2-) - Signed by Nicholas Lose, MD on 11/14/2018    12/15/2018 -  Chemotherapy    The patient had DOXOrubicin (ADRIAMYCIN) chemo injection 142 mg, 60 mg/m2 = 142 mg, Intravenous,  Once, 0 of 4 cycles palonosetron (ALOXI) injection 0.25 mg, 0.25 mg, Intravenous,  Once, 0 of 4 cycles pegfilgrastim-cbqv (UDENYCA) injection 6 mg, 6 mg, Subcutaneous, Once, 0 of 4 cycles cyclophosphamide (CYTOXAN) 1,420 mg in sodium chloride 0.9 % 250 mL  chemo infusion, 600 mg/m2 = 1,420 mg, Intravenous,  Once, 0 of 4 cycles PACLitaxel (TAXOL) 192 mg in sodium chloride 0.9 % 250 mL chemo infusion (</= 63m/m2), 80 mg/m2 = 192 mg, Intravenous,  Once, 0 of 12 cycles fosaprepitant (EMEND) 150 mg, dexamethasone (DECADRON) 12 mg in sodium chloride 0.9 % 145 mL IVPB, , Intravenous,  Once, 0 of 4 cycles  for chemotherapy treatment.      CHIEF COMPLIANT: Cycle 1 Adriamycin and Cytoxan  INTERVAL HISTORY: PMarinell Igarashiis a 63y.o. with above-mentioned history of left breast cancer who underwent a lumpectomy followed by an axillary lymph node dissection and is here to begin adjuvant chemotherapy with dose dense Adriamycin and Cytoxan. Her port was placed by Dr. CBrantley Stageon 12/01/18. An ECHO from 12/12/18 showed an ejection fraction in the range of 55-60%. She presents to the clinic today for cycle 1.   REVIEW OF SYSTEMS:   Constitutional: Denies fevers, chills or abnormal weight loss Eyes: Denies blurriness of vision Ears, nose, mouth, throat, and face: Denies mucositis or sore throat Respiratory: Denies cough, dyspnea or wheezes Cardiovascular: Denies palpitation, chest discomfort Gastrointestinal: Denies nausea, heartburn or change in bowel habits Skin: Denies abnormal skin rashes Lymphatics: Denies new lymphadenopathy or easy bruising Neurological: Denies numbness, tingling or new weaknesses Behavioral/Psych: Mood is stable, no new changes  Extremities: No lower extremity edema Breast: denies any pain or lumps or nodules in either breasts All other systems were reviewed with the patient and are negative.  I have reviewed the past medical history, past surgical history, social history and family history with the patient and they are unchanged from previous note.  ALLERGIES:  is allergic to adhesive [tape]; erythromycin; lisinopril; and penicillins.  MEDICATIONS:  Current Outpatient Medications  Medication Sig Dispense Refill  . amLODipine  (NORVASC) 5 MG tablet Take 1 tablet (5 mg total) by mouth daily. 90 tablet 1  . carvedilol (COREG) 25 MG tablet TAKE 1 TABLET(25 MG) BY MOUTH TWICE DAILY WITH A MEAL 180 tablet 1  . dexamethasone (DECADRON) 4 MG tablet Take1 tab day after chemo and 1 tab 2 days after chemo with food 8 tablet 0  . EPINEPHRINE 0.3 mg/0.3 mL IJ SOAJ injection INJECT 0.3 ML(1 SYRINGE) IN THE MUSCLE AS NEEDED FOR ALLERGIC REACTION 2 Device 0  . furosemide (LASIX) 20 MG tablet TAKE 1 TABLET(20 MG) BY MOUTH DAILY 90 tablet 1  . HYDROcodone-acetaminophen (NORCO/VICODIN) 5-325 MG tablet Take 1 tablet by mouth every 6 (six) hours as needed for moderate pain. 12 tablet 0  . lidocaine-prilocaine (EMLA) cream Apply to affected area once 30 g 3  . LORazepam (ATIVAN) 0.5 MG tablet Take 1 tablet (0.5 mg total) by mouth at bedtime as needed for sleep. 30 tablet 0  . meloxicam (MOBIC) 15 MG tablet TAKE 1 TABLET(15 MG) BY MOUTH DAILY AS NEEDED FOR PAIN 90 tablet 3  . ondansetron (ZOFRAN) 8 MG tablet Take 1 tablet (8 mg total) by mouth 2 (two) times daily as needed. Start on the third day after chemotherapy. 30 tablet 1  . prochlorperazine (COMPAZINE) 10 MG tablet Take 1 tablet (10 mg total) by mouth every 6 (six) hours as needed (Nausea or vomiting). 30 tablet 1  . SYNTHROID 100 MCG tablet 1 po qd 90 tablet 3  . valACYclovir (VALTREX) 1000 MG tablet TAKE 1 TABLET BY MOUTH THREE TIMES DAILY, DISCONTINUE ALL PREVIOUS REFILLS FOR THIS MEDICATION 30 tablet 5   No current facility-administered medications for this visit.     PHYSICAL EXAMINATION: ECOG PERFORMANCE STATUS: 0 - Asymptomatic  Vitals:   12/15/18 0850  BP: (!) 175/74  Pulse: 76  Resp: 17  Temp: 98.1 F (36.7 C)  SpO2: 100%   Filed Weights   12/15/18 0850  Weight: 267 lb 6.4 oz (121.3 kg)    GENERAL: alert, no distress and comfortable SKIN: skin color, texture, turgor are normal, no rashes or significant lesions EYES: normal, Conjunctiva are pink and  non-injected, sclera clear OROPHARYNX: no exudate, no erythema and lips, buccal mucosa, and tongue normal  NECK: supple, thyroid normal size, non-tender, without nodularity LYMPH: no palpable lymphadenopathy in the cervical, axillary or inguinal LUNGS: clear to auscultation and percussion with normal breathing effort HEART: regular rate & rhythm and no murmurs and no lower extremity edema ABDOMEN: abdomen soft, non-tender and normal bowel sounds MUSCULOSKELETAL: no cyanosis of digits and no clubbing  NEURO: alert & oriented x 3 with fluent speech, no focal motor/sensory deficits EXTREMITIES: No lower extremity edema  LABORATORY DATA:  I have reviewed the data as listed CMP Latest Ref Rng & Units 11/23/2018 10/05/2018 08/31/2018  Glucose 70 - 99 mg/dL 88 103(H) 117(H)  BUN 6 - 20 mg/dL _0 Creatinine 0.44 - 1.00 mg/dL 0.69 0.71 0.84  Sodium 135 - 145 mmol/L 141 140 143  Potassium 3.5 - 5.1 mmol/L 4.6 4.0 4.8  Chloride 98 - 111 mmol/L 103 107 106  CO2 22 - 32 mmol/L _1 Calcium 8.9 -  10.3 mg/dL 9.2 8.6(L) 9.7  Total Protein 6.5 - 8.1 g/dL 6.7 6.5 7.7  Total Bilirubin 0.3 - 1.2 mg/dL 0.9 1.7(H) 1.4(H)  Alkaline Phos 38 - 126 U/L 70 56 81  AST 15 - 41 U/L 17 16 12(L)  ALT 0 - 44 U/L _0 Lab Results  Component Value Date   WBC 8.9 12/15/2018   HGB 12.7 12/15/2018   HCT 39.9 12/15/2018   MCV 86.0 12/15/2018   PLT 220 12/15/2018   NEUTROABS 6.1 12/15/2018    ASSESSMENT & PLAN:  Malignant neoplasm of upper-outer quadrant of left breast in female, estrogen receptor positive ([facility]) Left lumpectomy: Grade 2 IDC with DCIS, 1 cm, margins negative, DCIS focally 0.1 cm from superior margin, 2/2 lymph nodes positive, ER 95%, PR 45%, HER-2 negative, Ki-67 20% AXLND: 3/20 LN Pos (total 5/22)  Treatment Plan: 1. Adj chemo with DD Adriamycin and Cytoxan followed by Taxol weekly X 12 2. Adj XRT 3. Adj Anti estrogen therapy  --------------------------------------------------------------------------------------------- Current treatment: Cycle 1 day 1 dose dense Adriamycin and Cytoxan Echocardiogram Labs reviewed Chemo consent obtained Chemo education completed  Return to clinic in 1 week for toxicity check     No orders of the defined types were placed in this encounter.  The patient has a good understanding of the overall plan. she agrees with it. she will call with any problems that may develop before the next visit here.  Nicholas Lose, MD 12/15/2018  Julious Oka Dorshimer am acting as scribe for Dr. Nicholas Lose.  I have reviewed the above documentation for accuracy and completeness, and I agree with the above.

## 2018-12-15 ENCOUNTER — Inpatient Hospital Stay: Payer: BC Managed Care – PPO

## 2018-12-15 ENCOUNTER — Other Ambulatory Visit: Payer: Self-pay

## 2018-12-15 ENCOUNTER — Inpatient Hospital Stay (HOSPITAL_BASED_OUTPATIENT_CLINIC_OR_DEPARTMENT_OTHER): Payer: BC Managed Care – PPO | Admitting: Hematology and Oncology

## 2018-12-15 DIAGNOSIS — Z791 Long term (current) use of non-steroidal anti-inflammatories (NSAID): Secondary | ICD-10-CM

## 2018-12-15 DIAGNOSIS — Z79899 Other long term (current) drug therapy: Secondary | ICD-10-CM

## 2018-12-15 DIAGNOSIS — Z923 Personal history of irradiation: Secondary | ICD-10-CM | POA: Diagnosis not present

## 2018-12-15 DIAGNOSIS — C50412 Malignant neoplasm of upper-outer quadrant of left female breast: Secondary | ICD-10-CM

## 2018-12-15 DIAGNOSIS — Z170 Estrogen receptor positive status [ER+]: Principal | ICD-10-CM

## 2018-12-15 DIAGNOSIS — Z9221 Personal history of antineoplastic chemotherapy: Secondary | ICD-10-CM | POA: Diagnosis not present

## 2018-12-15 DIAGNOSIS — Z95828 Presence of other vascular implants and grafts: Secondary | ICD-10-CM | POA: Insufficient documentation

## 2018-12-15 DIAGNOSIS — Z5111 Encounter for antineoplastic chemotherapy: Secondary | ICD-10-CM | POA: Diagnosis not present

## 2018-12-15 LAB — CMP (CANCER CENTER ONLY)
ALT: 10 U/L (ref 0–44)
AST: 12 U/L — ABNORMAL LOW (ref 15–41)
Albumin: 3.4 g/dL — ABNORMAL LOW (ref 3.5–5.0)
Alkaline Phosphatase: 74 U/L (ref 38–126)
Anion gap: 10 (ref 5–15)
BUN: 14 mg/dL (ref 6–20)
CO2: 26 mmol/L (ref 22–32)
Calcium: 8.5 mg/dL — ABNORMAL LOW (ref 8.9–10.3)
Chloride: 107 mmol/L (ref 98–111)
Creatinine: 0.74 mg/dL (ref 0.44–1.00)
GFR, Est AFR Am: >60 mL/min (ref >60)
GFR, Estimated: >60 mL/min (ref >60)
Glucose, Bld: 106 mg/dL — ABNORMAL HIGH (ref 70–99)
Potassium: 3.9 mmol/L (ref 3.5–5.1)
Sodium: 143 mmol/L (ref 135–145)
Total Bilirubin: 0.8 mg/dL (ref 0.3–1.2)
Total Protein: 6.9 g/dL (ref 6.5–8.1)

## 2018-12-15 LAB — CBC WITH DIFFERENTIAL (CANCER CENTER ONLY)
Abs Immature Granulocytes: 0.02 10*3/uL (ref 0.00–0.07)
Basophils Absolute: 0 10*3/uL (ref 0.0–0.1)
Basophils Relative: 0 %
Eosinophils Absolute: 0.2 10*3/uL (ref 0.0–0.5)
Eosinophils Relative: 2 %
HCT: 39.9 % (ref 36.0–46.0)
Hemoglobin: 12.7 g/dL (ref 12.0–15.0)
Immature Granulocytes: 0 %
Lymphocytes Relative: 22 %
Lymphs Abs: 1.9 10*3/uL (ref 0.7–4.0)
MCH: 27.4 pg (ref 26.0–34.0)
MCHC: 31.8 g/dL (ref 30.0–36.0)
MCV: 86 fL (ref 80.0–100.0)
Monocytes Absolute: 0.7 10*3/uL (ref 0.1–1.0)
Monocytes Relative: 7 %
Neutro Abs: 6.1 10*3/uL (ref 1.7–7.7)
Neutrophils Relative %: 69 %
Platelet Count: 220 10*3/uL (ref 150–400)
RBC: 4.64 MIL/uL (ref 3.87–5.11)
RDW: 14.7 % (ref 11.5–15.5)
WBC Count: 8.9 10*3/uL (ref 4.0–10.5)
nRBC: 0 % (ref 0.0–0.2)

## 2018-12-15 MED ORDER — SODIUM CHLORIDE 0.9 % IV SOLN
600.0000 mg/m2 | Freq: Once | INTRAVENOUS | Status: AC
  Administered 2018-12-15: 11:00:00 1420 mg via INTRAVENOUS
  Filled 2018-12-15: qty 71

## 2018-12-15 MED ORDER — SODIUM CHLORIDE 0.9% FLUSH
10.0000 mL | INTRAVENOUS | Status: DC | PRN
  Administered 2018-12-15: 10 mL
  Filled 2018-12-15: qty 10

## 2018-12-15 MED ORDER — SODIUM CHLORIDE 0.9 % IV SOLN
Freq: Once | INTRAVENOUS | Status: AC
  Administered 2018-12-15: 10:00:00 via INTRAVENOUS
  Filled 2018-12-15: qty 5

## 2018-12-15 MED ORDER — DOXORUBICIN HCL CHEMO IV INJECTION 2 MG/ML
60.0000 mg/m2 | Freq: Once | INTRAVENOUS | Status: AC
  Administered 2018-12-15: 142 mg via INTRAVENOUS
  Filled 2018-12-15: qty 71

## 2018-12-15 MED ORDER — PALONOSETRON HCL INJECTION 0.25 MG/5ML
INTRAVENOUS | Status: AC
  Filled 2018-12-15: qty 5

## 2018-12-15 MED ORDER — PALONOSETRON HCL INJECTION 0.25 MG/5ML
0.2500 mg | Freq: Once | INTRAVENOUS | Status: AC
  Administered 2018-12-15: 10:00:00 0.25 mg via INTRAVENOUS

## 2018-12-15 MED ORDER — SODIUM CHLORIDE 0.9 % IV SOLN
Freq: Once | INTRAVENOUS | Status: AC
  Administered 2018-12-15: 10:00:00 via INTRAVENOUS
  Filled 2018-12-15: qty 250

## 2018-12-15 MED ORDER — HEPARIN SOD (PORK) LOCK FLUSH 100 UNIT/ML IV SOLN
500.0000 [IU] | Freq: Once | INTRAVENOUS | Status: AC | PRN
  Administered 2018-12-15: 12:00:00 500 [IU]
  Filled 2018-12-15: qty 5

## 2018-12-15 NOTE — Patient Instructions (Signed)
[facility] Discharge Instructions for Patients Receiving Chemotherapy  Today you received the following chemotherapy agents Doxorubicin and Cyclophosphamide  To help prevent nausea and vomiting after your treatment, we encourage you to take your nausea medication as directed.  If you develop nausea and vomiting that is not controlled by your nausea medication, call the clinic.   BELOW ARE SYMPTOMS THAT SHOULD BE REPORTED IMMEDIATELY:  *FEVER GREATER THAN 100.5 F  *CHILLS WITH OR WITHOUT FEVER  NAUSEA AND VOMITING THAT IS NOT CONTROLLED WITH YOUR NAUSEA MEDICATION  *UNUSUAL SHORTNESS OF BREATH  *UNUSUAL BRUISING OR BLEEDING  TENDERNESS IN MOUTH AND THROAT WITH OR WITHOUT PRESENCE OF ULCERS  *URINARY PROBLEMS  *BOWEL PROBLEMS  UNUSUAL RASH Items with * indicate a potential emergency and should be followed up as soon as possible.  Feel free to call the clinic should you have any questions or concerns. The clinic phone number is [phone].  Please show the [facility] at check-in to the Emergency Department and triage nurse.  Doxorubicin injection What is this medicine? DOXORUBICIN (dox oh ROO bi sin) is a chemotherapy drug. It is used to treat many kinds of cancer like leukemia, lymphoma, neuroblastoma, sarcoma, and Wilms' tumor. It is also used to treat bladder cancer, breast cancer, lung cancer, ovarian cancer, stomach cancer, and thyroid cancer. This medicine may be used for other purposes; ask your health care provider or pharmacist if you have questions. COMMON BRAND NAME(S): Adriamycin, Adriamycin PFS, Adriamycin RDF, Rubex What should I tell my health care provider before I take this medicine? They need to know if you have any of these conditions: -heart disease -history of low blood counts caused by a medicine -liver disease -recent or ongoing radiation therapy -an unusual or allergic reaction to doxorubicin, other chemotherapy agents, other  medicines, foods, dyes, or preservatives -pregnant or trying to get pregnant -breast-feeding How should I use this medicine? This drug is given as an infusion into a vein. It is administered in a hospital or clinic by a specially trained health care professional. If you have pain, swelling, burning or any unusual feeling around the site of your injection, tell your health care professional right away. Talk to your pediatrician regarding the use of this medicine in children. Special care may be needed. Overdosage: If you think you have taken too much of this medicine contact a poison control center or emergency room at once. NOTE: This medicine is only for you. Do not share this medicine with others. What if I miss a dose? It is important not to miss your dose. Call your doctor or health care professional if you are unable to keep an appointment. What may interact with this medicine? This medicine may interact with the following medications: -6-mercaptopurine -paclitaxel -phenytoin -St. John's Wort -trastuzumab -verapamil This list may not describe all possible interactions. Give your health care provider a list of all the medicines, herbs, non-prescription drugs, or dietary supplements you use. Also tell them if you smoke, drink alcohol, or use illegal drugs. Some items may interact with your medicine. What should I watch for while using this medicine? This drug may make you feel generally unwell. This is not uncommon, as chemotherapy can affect healthy cells as well as cancer cells. Report any side effects. Continue your course of treatment even though you feel ill unless your doctor tells you to stop. There is a maximum amount of this medicine you should receive throughout your life. The amount depends on the  medical condition being treated and your overall health. Your doctor will watch how much of this medicine you receive in your lifetime. Tell your doctor if you have taken this medicine  before. You may need blood work done while you are taking this medicine. Your urine may turn red for a few days after your dose. This is not blood. If your urine is dark or brown, call your doctor. In some cases, you may be given additional medicines to help with side effects. Follow all directions for their use. Call your doctor or health care professional for advice if you get a fever, chills or sore throat, or other symptoms of a cold or flu. Do not treat yourself. This drug decreases your body's ability to fight infections. Try to avoid being around people who are sick. This medicine may increase your risk to bruise or bleed. Call your doctor or health care professional if you notice any unusual bleeding. Talk to your doctor about your risk of cancer. You may be more at risk for certain types of cancers if you take this medicine. Do not become pregnant while taking this medicine or for 6 months after stopping it. Women should inform their doctor if they wish to become pregnant or think they might be pregnant. Men should not father a child while taking this medicine and for 6 months after stopping it. There is a potential for serious side effects to an unborn child. Talk to your health care professional or pharmacist for more information. Do not breast-feed an infant while taking this medicine. This medicine has caused ovarian failure in some women and reduced sperm counts in some men This medicine may interfere with the ability to have a child. Talk with your doctor or health care professional if you are concerned about your fertility. This medicine may cause a decrease in Co-Enzyme Q-10. You should make sure that you get enough Co-Enzyme Q-10 while you are taking this medicine. Discuss the foods you eat and the vitamins you take with your health care professional. What side effects may I notice from receiving this medicine? Side effects that you should report to your doctor or health care  professional as soon as possible: -allergic reactions like skin rash, itching or hives, swelling of the face, lips, or tongue -breathing problems -chest pain -fast or irregular heartbeat -low blood counts - this medicine may decrease the number of white blood cells, red blood cells and platelets. You may be at increased risk for infections and bleeding. -pain, redness, or irritation at site where injected -signs of infection - fever or chills, cough, sore throat, pain or difficulty passing urine -signs of decreased platelets or bleeding - bruising, pinpoint red spots on the skin, black, tarry stools, blood in the urine -swelling of the ankles, feet, hands -tiredness -weakness Side effects that usually do not require medical attention (report to your doctor or health care professional if they continue or are bothersome): -diarrhea -hair loss -mouth sores -nail discoloration or damage -nausea -red colored urine -vomiting This list may not describe all possible side effects. Call your doctor for medical advice about side effects. You may report side effects to FDA at 1-800-FDA-1088. Where should I keep my medicine? This drug is given in a hospital or clinic and will not be stored at home. NOTE: This sheet is a summary. It may not cover all possible information. If you have questions about this medicine, talk to your doctor, pharmacist, or health care provider.  2019 Elsevier/Gold  Standard (2017-03-17 11:01:26)  Cyclophosphamide injection What is this medicine? CYCLOPHOSPHAMIDE (sye kloe FOSS fa mide) is a chemotherapy drug. It slows the growth of cancer cells. This medicine is used to treat many types of cancer like lymphoma, myeloma, leukemia, breast cancer, and ovarian cancer, to name a few. This medicine may be used for other purposes; ask your health care provider or pharmacist if you have questions. COMMON BRAND NAME(S): Cytoxan, Neosar What should I tell my health care provider  before I take this medicine? They need to know if you have any of these conditions: -blood disorders -history of other chemotherapy -infection -kidney disease -liver disease -recent or ongoing radiation therapy -tumors in the bone marrow -an unusual or allergic reaction to cyclophosphamide, other chemotherapy, other medicines, foods, dyes, or preservatives -pregnant or trying to get pregnant -breast-feeding How should I use this medicine? This drug is usually given as an injection into a vein or muscle or by infusion into a vein. It is administered in a hospital or clinic by a specially trained health care professional. Talk to your pediatrician regarding the use of this medicine in children. Special care may be needed. Overdosage: If you think you have taken too much of this medicine contact a poison control center or emergency room at once. NOTE: This medicine is only for you. Do not share this medicine with others. What if I miss a dose? It is important not to miss your dose. Call your doctor or health care professional if you are unable to keep an appointment. What may interact with this medicine? This medicine may interact with the following medications: -amiodarone -amphotericin B -azathioprine -certain antiviral medicines for HIV or AIDS such as protease inhibitors (e.g., indinavir, ritonavir) and zidovudine -certain blood pressure medications such as benazepril, captopril, enalapril, fosinopril, lisinopril, moexipril, monopril, perindopril, quinapril, ramipril, trandolapril -certain cancer medications such as anthracyclines (e.g., daunorubicin, doxorubicin), busulfan, cytarabine, paclitaxel, pentostatin, tamoxifen, trastuzumab -certain diuretics such as chlorothiazide, chlorthalidone, hydrochlorothiazide, indapamide, metolazone -certain medicines that treat or prevent blood clots like warfarin -certain muscle relaxants such as  succinylcholine -cyclosporine -etanercept -indomethacin -medicines to increase blood counts like filgrastim, pegfilgrastim, sargramostim -medicines used as general anesthesia -metronidazole -natalizumab This list may not describe all possible interactions. Give your health care provider a list of all the medicines, herbs, non-prescription drugs, or dietary supplements you use. Also tell them if you smoke, drink alcohol, or use illegal drugs. Some items may interact with your medicine. What should I watch for while using this medicine? Visit your doctor for checks on your progress. This drug may make you feel generally unwell. This is not uncommon, as chemotherapy can affect healthy cells as well as cancer cells. Report any side effects. Continue your course of treatment even though you feel ill unless your doctor tells you to stop. Drink water or other fluids as directed. Urinate often, even at night. In some cases, you may be given additional medicines to help with side effects. Follow all directions for their use. Call your doctor or health care professional for advice if you get a fever, chills or sore throat, or other symptoms of a cold or flu. Do not treat yourself. This drug decreases your body's ability to fight infections. Try to avoid being around people who are sick. This medicine may increase your risk to bruise or bleed. Call your doctor or health care professional if you notice any unusual bleeding. Be careful brushing and flossing your teeth or using a toothpick because you may get  an infection or bleed more easily. If you have any dental work done, tell your dentist you are receiving this medicine. You may get drowsy or dizzy. Do not drive, use machinery, or do anything that needs mental alertness until you know how this medicine affects you. Do not become pregnant while taking this medicine or for 1 year after stopping it. Women should inform their doctor if they wish to become  pregnant or think they might be pregnant. Men should not father a child while taking this medicine and for 4 months after stopping it. There is a potential for serious side effects to an unborn child. Talk to your health care professional or pharmacist for more information. Do not breast-feed an infant while taking this medicine. This medicine may interfere with the ability to have a child. This medicine has caused ovarian failure in some women. This medicine has caused reduced sperm counts in some men. You should talk with your doctor or health care professional if you are concerned about your fertility. If you are going to have surgery, tell your doctor or health care professional that you have taken this medicine. What side effects may I notice from receiving this medicine? Side effects that you should report to your doctor or health care professional as soon as possible: -allergic reactions like skin rash, itching or hives, swelling of the face, lips, or tongue -low blood counts - this medicine may decrease the number of white blood cells, red blood cells and platelets. You may be at increased risk for infections and bleeding. -signs of infection - fever or chills, cough, sore throat, pain or difficulty passing urine -signs of decreased platelets or bleeding - bruising, pinpoint red spots on the skin, black, tarry stools, blood in the urine -signs of decreased red blood cells - unusually weak or tired, fainting spells, lightheadedness -breathing problems -dark urine -dizziness -palpitations -swelling of the ankles, feet, hands -trouble passing urine or change in the amount of urine -weight gain -yellowing of the eyes or skin Side effects that usually do not require medical attention (report to your doctor or health care professional if they continue or are bothersome): -changes in nail or skin color -hair loss -missed menstrual periods -mouth sores -nausea, vomiting This list may not  describe all possible side effects. Call your doctor for medical advice about side effects. You may report side effects to FDA at 1-800-FDA-1088. Where should I keep my medicine? This drug is given in a hospital or clinic and will not be stored at home. NOTE: This sheet is a summary. It may not cover all possible information. If you have questions about this medicine, talk to your doctor, pharmacist, or health care provider.  2019 Elsevier/Gold Standard (2012-06-17 16:22:58)  Coronavirus (COVID-19) Are you at risk?  Are you at risk for the Coronavirus (COVID-19)?  To be considered HIGH RISK for Coronavirus (COVID-19), you have to meet the following criteria:  . Traveled to [place], [place], [place], [place] or [place]; or in the [place] to [place], [place], [place], or [place]; and have fever, cough, and shortness of breath within the last 2 weeks of travel OR . Been in close contact with a person diagnosed with COVID-19 within the last 2 weeks and have fever, cough, and shortness of breath . IF YOU DO NOT MEET THESE CRITERIA, YOU ARE CONSIDERED LOW RISK FOR COVID-19.  What to do if you are HIGH RISK for COVID-19?  Marland Kitchen If you are having a  medical emergency, call 911. . Seek medical care right away. Before you go to a doctor's office, urgent care or emergency department, call ahead and tell them about your recent travel, contact with someone diagnosed with COVID-19, and your symptoms. You should receive instructions from your physician's office regarding next steps of care.  . When you arrive at healthcare provider, tell the healthcare staff immediately you have returned from visiting [place], [place], [place], [place] or [place]; or traveled in the [place] to [place], [place], [place], or [place]; in the last two weeks or you have been in close contact with a person diagnosed with COVID-19 in the last 2 weeks.   . Tell the health care staff about your symptoms: fever, cough  and shortness of breath. . After you have been seen by a medical provider, you will be either: o Tested for (COVID-19) and discharged home on quarantine except to seek medical care if symptoms worsen, and asked to  - Stay home and avoid contact with others until you get your results (4-5 days)  - Avoid travel on public transportation if possible (such as bus, train, or airplane) or o Sent to the Emergency Department by EMS for evaluation, COVID-19 testing, and possible admission depending on your condition and test results.  What to do if you are LOW RISK for COVID-19?  Reduce your risk of any infection by using the same precautions used for avoiding the common cold or flu:  Marland Kitchen Wash your hands often with soap and warm water for at least 20 seconds.  If soap and water are not readily available, use an alcohol-based hand sanitizer with at least 60% alcohol.  . If coughing or sneezing, cover your mouth and nose by coughing or sneezing into the elbow areas of your shirt or coat, into a tissue or into your sleeve (not your hands). . Avoid shaking hands with others and consider head nods or verbal greetings only. . Avoid touching your eyes, nose, or mouth with unwashed hands.  . Avoid close contact with people who are sick. . Avoid places or events with large numbers of people in one location, like concerts or sporting events. . Carefully consider travel plans you have or are making. . If you are planning any travel outside or inside the [place], visit the CDC's Travelers' Health webpage for the latest health notices. . If you have some symptoms but not all symptoms, continue to monitor at home and seek medical attention if your symptoms worsen. . If you are having a medical emergency, call 911.   [place] / e-Visit: [email or web address]         [facility]: [facility]:  [phone]                   [facility]: [phone]

## 2018-12-16 ENCOUNTER — Telehealth: Payer: Self-pay

## 2018-12-16 ENCOUNTER — Telehealth: Payer: Self-pay | Admitting: *Deleted

## 2018-12-16 NOTE — Telephone Encounter (Signed)
Called pt to assess needs after 1st chemo. Related doing well, not N/V. Relate taking anti-nausea medications a prescribed. Encourage pt to continue with anti-nausea medications as instructed after chemo. Received verbal understanding. Denies questions, concerns or needs at this time.

## 2018-12-16 NOTE — Telephone Encounter (Signed)
Nurse placed call to patient to follow up after first time chemotherapy.    Pt doing well, denies any nausea.  Reports minimal fatigue.  Tolerating fluids and meals well.  No fever.  All questions answered.  No further needs at this time.

## 2018-12-17 ENCOUNTER — Other Ambulatory Visit: Payer: Self-pay

## 2018-12-17 ENCOUNTER — Inpatient Hospital Stay: Payer: BC Managed Care – PPO | Attending: Hematology and Oncology

## 2018-12-17 VITALS — BP 180/76 | HR 65 | Temp 97.9°F | Resp 18

## 2018-12-17 DIAGNOSIS — Z5111 Encounter for antineoplastic chemotherapy: Secondary | ICD-10-CM | POA: Diagnosis not present

## 2018-12-17 DIAGNOSIS — R110 Nausea: Secondary | ICD-10-CM | POA: Diagnosis not present

## 2018-12-17 DIAGNOSIS — Z7982 Long term (current) use of aspirin: Secondary | ICD-10-CM | POA: Diagnosis not present

## 2018-12-17 DIAGNOSIS — C50412 Malignant neoplasm of upper-outer quadrant of left female breast: Secondary | ICD-10-CM | POA: Diagnosis present

## 2018-12-17 DIAGNOSIS — Z9221 Personal history of antineoplastic chemotherapy: Secondary | ICD-10-CM | POA: Diagnosis not present

## 2018-12-17 DIAGNOSIS — Z7689 Persons encountering health services in other specified circumstances: Secondary | ICD-10-CM | POA: Insufficient documentation

## 2018-12-17 DIAGNOSIS — Z170 Estrogen receptor positive status [ER+]: Secondary | ICD-10-CM | POA: Diagnosis not present

## 2018-12-17 DIAGNOSIS — Z79899 Other long term (current) drug therapy: Secondary | ICD-10-CM | POA: Diagnosis not present

## 2018-12-17 MED ORDER — PEGFILGRASTIM-CBQV 6 MG/0.6ML ~~LOC~~ SOSY
6.0000 mg | PREFILLED_SYRINGE | Freq: Once | SUBCUTANEOUS | Status: AC
  Administered 2018-12-17: 6 mg via SUBCUTANEOUS

## 2018-12-17 MED ORDER — PEGFILGRASTIM-CBQV 6 MG/0.6ML ~~LOC~~ SOSY
PREFILLED_SYRINGE | SUBCUTANEOUS | Status: AC
  Filled 2018-12-17: qty 0.6

## 2018-12-17 NOTE — Patient Instructions (Signed)
Pegfilgrastim injection  What is this medicine?  PEGFILGRASTIM (PEG fil gra stim) is a long-acting granulocyte colony-stimulating factor that stimulates the growth of neutrophils, a type of white blood cell important in the body's fight against infection. It is used to reduce the incidence of fever and infection in patients with certain types of cancer who are receiving chemotherapy that affects the bone marrow, and to increase survival after being exposed to high doses of radiation.  This medicine may be used for other purposes; ask your health care provider or pharmacist if you have questions.  COMMON BRAND NAME(S): Fulphila, Neulasta, UDENYCA  What should I tell my health care provider before I take this medicine?  They need to know if you have any of these conditions:  -kidney disease  -latex allergy  -ongoing radiation therapy  -sickle cell disease  -skin reactions to acrylic adhesives (On-Body Injector only)  -an unusual or allergic reaction to pegfilgrastim, filgrastim, other medicines, foods, dyes, or preservatives  -pregnant or trying to get pregnant  -breast-feeding  How should I use this medicine?  This medicine is for injection under the skin. If you get this medicine at home, you will be taught how to prepare and give the pre-filled syringe or how to use the On-body Injector. Refer to the patient Instructions for Use for detailed instructions. Use exactly as directed. Tell your healthcare provider immediately if you suspect that the On-body Injector may not have performed as intended or if you suspect the use of the On-body Injector resulted in a missed or partial dose.  It is important that you put your used needles and syringes in a special sharps container. Do not put them in a trash can. If you do not have a sharps container, call your pharmacist or healthcare provider to get one.  Talk to your pediatrician regarding the use of this medicine in children. While this drug may be prescribed for  selected conditions, precautions do apply.  Overdosage: If you think you have taken too much of this medicine contact a poison control center or emergency room at once.  NOTE: This medicine is only for you. Do not share this medicine with others.  What if I miss a dose?  It is important not to miss your dose. Call your doctor or health care professional if you miss your dose. If you miss a dose due to an On-body Injector failure or leakage, a new dose should be administered as soon as possible using a single prefilled syringe for manual use.  What may interact with this medicine?  Interactions have not been studied.  Give your health care provider a list of all the medicines, herbs, non-prescription drugs, or dietary supplements you use. Also tell them if you smoke, drink alcohol, or use illegal drugs. Some items may interact with your medicine.  This list may not describe all possible interactions. Give your health care provider a list of all the medicines, herbs, non-prescription drugs, or dietary supplements you use. Also tell them if you smoke, drink alcohol, or use illegal drugs. Some items may interact with your medicine.  What should I watch for while using this medicine?  You may need blood work done while you are taking this medicine.  If you are going to need a MRI, CT scan, or other procedure, tell your doctor that you are using this medicine (On-Body Injector only).  What side effects may I notice from receiving this medicine?  Side effects that you should report to   your doctor or health care professional as soon as possible:  -allergic reactions like skin rash, itching or hives, swelling of the face, lips, or tongue  -back pain  -dizziness  -fever  -pain, redness, or irritation at site where injected  -pinpoint red spots on the skin  -red or dark-brown urine  -shortness of breath or breathing problems  -stomach or side pain, or pain at the shoulder  -swelling  -tiredness  -trouble passing urine or  change in the amount of urine  Side effects that usually do not require medical attention (report to your doctor or health care professional if they continue or are bothersome):  -bone pain  -muscle pain  This list may not describe all possible side effects. Call your doctor for medical advice about side effects. You may report side effects to FDA at 1-800-FDA-1088.  Where should I keep my medicine?  Keep out of the reach of children.  If you are using this medicine at home, you will be instructed on how to store it. Throw away any unused medicine after the expiration date on the label.  NOTE: This sheet is a summary. It may not cover all possible information. If you have questions about this medicine, talk to your doctor, pharmacist, or health care provider.   2019 Elsevier/Gold Standard (2017-11-08 16:57:08)

## 2018-12-19 NOTE — Assessment & Plan Note (Signed)
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 Adj chemo with DD Adriamycin and Cytoxan followed by Taxol weekly X 12 2. Adj XRT 3. Adj Anti estrogen therapy --------------------------------------------------------------------------------------------- Current treatment: Cycle 1 day 8 dose dense Adriamycin and Cytoxan Chemo toxicities:  Return to clinic in 1 week for cycle 2

## 2018-12-21 NOTE — Progress Notes (Signed)
Patient Care Team: Carollee Herter, Alferd Apa, DO as PCP - General Dorna Leitz, MD as Consulting Physician (Orthopedic Surgery) Erroll Luna, MD as Consulting Physician (General Surgery) Nicholas Lose, MD as Consulting Physician (Hematology and Oncology) Kyung Rudd, MD as Consulting Physician (Radiation Oncology)  DIAGNOSIS:    ICD-10-CM   1. Malignant neoplasm of upper-outer quadrant of left breast in female, estrogen receptor positive ([place]) C50.412    Z17.0     SUMMARY OF ONCOLOGIC HISTORY:   Malignant neoplasm of upper-outer quadrant of left breast in female, estrogen receptor positive ([place])   08/24/2018 Initial Biopsy    Screening detected left breast mass at 2:30 position 6 mm in size with one abnormal lymph node and stable benign calcifications, biopsy of the mass grade 2 IDC with DCIS and lymphovascular invasion, lymph node biopsy is positive, ER 95%, PR 40%, Ki-67 20%, HER-2 -1+ by IHC, T1b N1 stage Ib    08/31/2018 Cancer Staging    Staging form: Breast, AJCC 8th Edition - Clinical: Stage IB (cT1b, cN1(f), cM0, G2, ER+, PR+, HER2-) - Signed by Nicholas Lose, MD on 08/31/2018    10/11/2018 Surgery    Left lumpectomy: Grade 2 IDC with DCIS, 1 cm, margins negative, DCIS focally 0.1 cm from superior margin, 2/2 lymph nodes positive, ER 95%, PR 45%, HER-2 negative, Ki-67 20% AXLND: 3/20 LN Pos (total 5/22)    10/18/2018 Cancer Staging    Staging form: Breast, AJCC 8th Edition - Pathologic stage from 10/18/2018: Stage IB (pT1b, pN2a, cM0, G2, ER+, PR+, HER2-) - Signed by Nicholas Lose, MD on 11/14/2018    12/15/2018 -  Chemotherapy    The patient had DOXOrubicin (ADRIAMYCIN) chemo injection 142 mg, 60 mg/m2 = 142 mg, Intravenous,  Once, 1 of 4 cycles Administration: 142 mg (12/15/2018) palonosetron (ALOXI) injection 0.25 mg, 0.25 mg, Intravenous,  Once, 1 of 4 cycles Administration: 0.25 mg (12/15/2018) pegfilgrastim-cbqv (UDENYCA) injection 6 mg, 6 mg, Subcutaneous, Once, 1 of 4 cycles  cyclophosphamide (CYTOXAN) 1,420 mg in sodium chloride 0.9 % 250 mL chemo infusion, 600 mg/m2 = 1,420 mg, Intravenous,  Once, 1 of 4 cycles Administration: 1,420 mg (12/15/2018) PACLitaxel (TAXOL) 192 mg in sodium chloride 0.9 % 250 mL chemo infusion (</= 34m/m2), 80 mg/m2 = 192 mg, Intravenous,  Once, 0 of 12 cycles fosaprepitant (EMEND) 150 mg, dexamethasone (DECADRON) 12 mg in sodium chloride 0.9 % 145 mL IVPB, , Intravenous,  Once, 1 of 4 cycles Administration:  (12/15/2018)  for chemotherapy treatment.      CHIEF COMPLIANT: Cycle 1 day 8 Adriamycin and Cytoxan  INTERVAL HISTORY: PKanyon Banks a 63y.o. with above-mentioned history of left breast cancer who underwent a lumpectomy followed by an axillary lymph node dissection and is currently on adjuvant chemotherapy with dose dense Adriamycin and Cytoxan. She presents to the clinic today for toxicity evaluation.  She tolerated chemo fairly well with the exception of Monday when she felt nauseated and had to take nausea medication.  Did not have any mouth sores.  Denies any constipation or diarrhea.  REVIEW OF SYSTEMS:   Constitutional: Denies fevers, chills or abnormal weight loss Eyes: Denies blurriness of vision Ears, nose, mouth, throat, and face: Denies mucositis or sore throat Respiratory: Denies cough, dyspnea or wheezes Cardiovascular: Denies palpitation, chest discomfort Gastrointestinal: Mild nausea Skin: Denies abnormal skin rashes Lymphatics: Denies new lymphadenopathy or easy bruising Neurological: Denies numbness, tingling or new weaknesses Behavioral/Psych: Mood is stable, no new changes  Extremities: No lower extremity edema Breast:  denies any pain or lumps or nodules in either breasts All other systems were reviewed with the patient and are negative.  I have reviewed the past medical history, past surgical history, social history and family history with the patient and they are unchanged from previous note.   ALLERGIES:  is allergic to adhesive [tape]; erythromycin; lisinopril; and penicillins.  MEDICATIONS:  Current Outpatient Medications  Medication Sig Dispense Refill  . amLODipine (NORVASC) 5 MG tablet Take 1 tablet (5 mg total) by mouth daily. 90 tablet 1  . carvedilol (COREG) 25 MG tablet TAKE 1 TABLET(25 MG) BY MOUTH TWICE DAILY WITH A MEAL 180 tablet 1  . dexamethasone (DECADRON) 4 MG tablet Take1 tab day after chemo and 1 tab 2 days after chemo with food 8 tablet 0  . EPINEPHRINE 0.3 mg/0.3 mL IJ SOAJ injection INJECT 0.3 ML(1 SYRINGE) IN THE MUSCLE AS NEEDED FOR ALLERGIC REACTION 2 Device 0  . furosemide (LASIX) 20 MG tablet TAKE 1 TABLET(20 MG) BY MOUTH DAILY 90 tablet 1  . HYDROcodone-acetaminophen (NORCO/VICODIN) 5-325 MG tablet Take 1 tablet by mouth every 6 (six) hours as needed for moderate pain. 12 tablet 0  . lidocaine-prilocaine (EMLA) cream Apply to affected area once 30 g 3  . LORazepam (ATIVAN) 0.5 MG tablet Take 1 tablet (0.5 mg total) by mouth at bedtime as needed for sleep. 30 tablet 0  . meloxicam (MOBIC) 15 MG tablet TAKE 1 TABLET(15 MG) BY MOUTH DAILY AS NEEDED FOR PAIN 90 tablet 3  . ondansetron (ZOFRAN) 8 MG tablet Take 1 tablet (8 mg total) by mouth 2 (two) times daily as needed. Start on the third day after chemotherapy. 30 tablet 1  . prochlorperazine (COMPAZINE) 10 MG tablet Take 1 tablet (10 mg total) by mouth every 6 (six) hours as needed (Nausea or vomiting). 30 tablet 1  . SYNTHROID 100 MCG tablet 1 po qd 90 tablet 3  . valACYclovir (VALTREX) 1000 MG tablet TAKE 1 TABLET BY MOUTH THREE TIMES DAILY, DISCONTINUE ALL PREVIOUS REFILLS FOR THIS MEDICATION 30 tablet 5   No current facility-administered medications for this visit.     PHYSICAL EXAMINATION: ECOG PERFORMANCE STATUS: 1 - Symptomatic but completely ambulatory  Vitals:   12/22/18 1115  BP: (!) 155/67  Pulse: 71  Resp: 18  Temp: 98.6 F (37 C)  SpO2: 100%   Filed Weights   12/22/18 1115   Weight: 265 lb 3.2 oz (120.3 kg)    GENERAL: alert, no distress and comfortable SKIN: skin color, texture, turgor are normal, no rashes or significant lesions EYES: normal, Conjunctiva are pink and non-injected, sclera clear OROPHARYNX: no exudate, no erythema and lips, buccal mucosa, and tongue normal  NECK: supple, thyroid normal size, non-tender, without nodularity LYMPH: no palpable lymphadenopathy in the cervical, axillary or inguinal LUNGS: clear to auscultation and percussion with normal breathing effort HEART: regular rate & rhythm and no murmurs and no lower extremity edema ABDOMEN: abdomen soft, non-tender and normal bowel sounds MUSCULOSKELETAL: no cyanosis of digits and no clubbing  NEURO: alert & oriented x 3 with fluent speech, no focal motor/sensory deficits EXTREMITIES: No lower extremity edema  LABORATORY DATA:  I have reviewed the data as listed CMP Latest Ref Rng & Units 12/15/2018 11/23/2018 10/05/2018  Glucose 70 - 99 mg/dL 106(H) 88 103(H)  BUN 6 - 20 mg/dL 14 11 9   Creatinine 0.44 - 1.00 mg/dL 0.74 0.69 0.71  Sodium 135 - 145 mmol/L 143 141 140  Potassium 3.5 - 5.1 mmol/L  3.9 4.6 4.0  Chloride 98 - 111 mmol/L 107 103 107  CO2 22 - 32 mmol/L 26 28 25   Calcium 8.9 - 10.3 mg/dL 8.5(L) 9.2 8.6(L)  Total Protein 6.5 - 8.1 g/dL 6.9 6.7 6.5  Total Bilirubin 0.3 - 1.2 mg/dL 0.8 0.9 1.7(H)  Alkaline Phos 38 - 126 U/L 74 70 56  AST 15 - 41 U/L 12(L) 17 16  ALT 0 - 44 U/L 10 14 15     Lab Results  Component Value Date   WBC 8.9 12/15/2018   HGB 12.7 12/15/2018   HCT 39.9 12/15/2018   MCV 86.0 12/15/2018   PLT 220 12/15/2018   NEUTROABS 6.1 12/15/2018    ASSESSMENT & PLAN:  Malignant neoplasm of upper-outer quadrant of left breast in female, estrogen receptor positive ([facility]) Left lumpectomy: Grade 2 IDC with DCIS, 1 cm, margins negative, DCIS focally 0.1 cm from superior margin, 2/2 lymph nodes positive, ER 95%, PR 45%, HER-2 negative, Ki-67 20% AXLND: 3/20 LN Pos  (total 5/22)  Treatment Plan: 1. Adj chemo with DD Adriamycin and Cytoxan followed by Taxol weekly X 12 2. Adj XRT 3. Adj Anti estrogen therapy --------------------------------------------------------------------------------------------- Current treatment: Cycle 1 day 8 dose dense Adriamycin and Cytoxan Chemo toxicities: Mild nausea but otherwise tolerated extremely well. I reviewed the lab work which showed a normal WBC count hemoglobin 12.8 and platelets 151.  CMP is also within normal limits. I instructed her to drink more water and eat more protein in the diet.  Return to clinic in 1 week for cycle 2    No orders of the defined types were placed in this encounter.  The patient has a good understanding of the overall plan. she agrees with it. she will call with any problems that may develop before the next visit here.  Nicholas Lose, MD 12/22/2018  Julious Oka Dorshimer am acting as scribe for Dr. Nicholas Lose.  I have reviewed the above documentation for accuracy and completeness, and I agree with the above.

## 2018-12-22 ENCOUNTER — Other Ambulatory Visit: Payer: Self-pay

## 2018-12-22 ENCOUNTER — Inpatient Hospital Stay: Payer: BC Managed Care – PPO

## 2018-12-22 ENCOUNTER — Inpatient Hospital Stay (HOSPITAL_BASED_OUTPATIENT_CLINIC_OR_DEPARTMENT_OTHER): Payer: BC Managed Care – PPO | Admitting: Hematology and Oncology

## 2018-12-22 DIAGNOSIS — Z170 Estrogen receptor positive status [ER+]: Secondary | ICD-10-CM | POA: Diagnosis not present

## 2018-12-22 DIAGNOSIS — C50412 Malignant neoplasm of upper-outer quadrant of left female breast: Secondary | ICD-10-CM | POA: Diagnosis not present

## 2018-12-22 DIAGNOSIS — Z79899 Other long term (current) drug therapy: Secondary | ICD-10-CM

## 2018-12-22 DIAGNOSIS — Z7689 Persons encountering health services in other specified circumstances: Secondary | ICD-10-CM | POA: Diagnosis not present

## 2018-12-22 DIAGNOSIS — Z9221 Personal history of antineoplastic chemotherapy: Secondary | ICD-10-CM

## 2018-12-22 DIAGNOSIS — R110 Nausea: Secondary | ICD-10-CM

## 2018-12-22 LAB — CMP (CANCER CENTER ONLY)
ALT: 11 U/L (ref 0–44)
AST: 10 U/L — ABNORMAL LOW (ref 15–41)
Albumin: 3.3 g/dL — ABNORMAL LOW (ref 3.5–5.0)
Alkaline Phosphatase: 105 U/L (ref 38–126)
Anion gap: 7 (ref 5–15)
BUN: 11 mg/dL (ref 6–20)
CO2: 29 mmol/L (ref 22–32)
Calcium: 8.9 mg/dL (ref 8.9–10.3)
Chloride: 104 mmol/L (ref 98–111)
Creatinine: 0.72 mg/dL (ref 0.44–1.00)
GFR, Est AFR Am: >60 mL/min (ref >60)
GFR, Estimated: >60 mL/min (ref >60)
Glucose, Bld: 119 mg/dL — ABNORMAL HIGH (ref 70–99)
Potassium: 4.1 mmol/L (ref 3.5–5.1)
Sodium: 140 mmol/L (ref 135–145)
Total Bilirubin: 0.6 mg/dL (ref 0.3–1.2)
Total Protein: 6.8 g/dL (ref 6.5–8.1)

## 2018-12-22 LAB — CBC WITH DIFFERENTIAL (CANCER CENTER ONLY)
Abs Immature Granulocytes: 0.11 10*3/uL — ABNORMAL HIGH (ref 0.00–0.07)
Basophils Absolute: 0 10*3/uL (ref 0.0–0.1)
Basophils Relative: 1 %
Eosinophils Absolute: 0.3 10*3/uL (ref 0.0–0.5)
Eosinophils Relative: 5 %
HCT: 40.1 % (ref 36.0–46.0)
Hemoglobin: 12.8 g/dL (ref 12.0–15.0)
Immature Granulocytes: 2 %
Lymphocytes Relative: 29 %
Lymphs Abs: 1.6 10*3/uL (ref 0.7–4.0)
MCH: 27.5 pg (ref 26.0–34.0)
MCHC: 31.9 g/dL (ref 30.0–36.0)
MCV: 86.2 fL (ref 80.0–100.0)
Monocytes Absolute: 0.3 10*3/uL (ref 0.1–1.0)
Monocytes Relative: 7 %
Neutro Abs: 3 10*3/uL (ref 1.7–7.7)
Neutrophils Relative %: 56 %
Platelet Count: 151 10*3/uL (ref 150–400)
RBC: 4.65 MIL/uL (ref 3.87–5.11)
RDW: 14.2 % (ref 11.5–15.5)
WBC Count: 5.3 10*3/uL (ref 4.0–10.5)
nRBC: 0 % (ref 0.0–0.2)

## 2018-12-26 NOTE — Assessment & Plan Note (Signed)
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 Adj chemo with DD Adriamycin and Cytoxan followed by Taxol weekly X 12 2. Adj XRT 3. Adj Anti estrogen therapy --------------------------------------------------------------------------------------------- Current treatment: Cycle 2 day 1 dose dense Adriamycin and Cytoxan Chemo toxicities: Mild nausea but otherwise tolerated extremely well. Labs reviewed I instructed her to drink more water and eat more protein in the diet.  Return to clinic in 2 weeks for cycle 3

## 2018-12-27 ENCOUNTER — Encounter: Payer: Self-pay | Admitting: *Deleted

## 2018-12-27 ENCOUNTER — Encounter: Payer: Self-pay | Admitting: Hematology and Oncology

## 2018-12-27 NOTE — Progress Notes (Signed)
Called pt on 12/13/18 tointroduce myself as her Arboriculturist and to discuss copay assistance.Pt gave me consent to apply in her behalf soI applied to the Patient [place] but unfortunately she was denied because her household income exceeded the guidelines of their program so I enrolled her in the [place] $15,000 for 12 monthsfrom5/12/20.Pt will pay $0 for her first treatment and $0 for each subsequent treatment (up to $7,200 per treatment). She is also over the income requirement for the [employer].

## 2018-12-28 NOTE — Progress Notes (Signed)
Patient Care Team: Carollee Herter, Alferd Apa, DO as PCP - General Dorna Leitz, MD as Consulting Physician (Orthopedic Surgery) Erroll Luna, MD as Consulting Physician (General Surgery) Nicholas Lose, MD as Consulting Physician (Hematology and Oncology) Kyung Rudd, MD as Consulting Physician (Radiation Oncology)  DIAGNOSIS:    ICD-10-CM   1. Malignant neoplasm of upper-outer quadrant of left breast in female, estrogen receptor positive ([place]) C50.412    Z17.0     SUMMARY OF ONCOLOGIC HISTORY:   Malignant neoplasm of upper-outer quadrant of left breast in female, estrogen receptor positive ([place])   08/24/2018 Initial Biopsy    Screening detected left breast mass at 2:30 position 6 mm in size with one abnormal lymph node and stable benign calcifications, biopsy of the mass grade 2 IDC with DCIS and lymphovascular invasion, lymph node biopsy is positive, ER 95%, PR 40%, Ki-67 20%, HER-2 -1+ by IHC, T1b N1 stage Ib    08/31/2018 Cancer Staging    Staging form: Breast, AJCC 8th Edition - Clinical: Stage IB (cT1b, cN1(f), cM0, G2, ER+, PR+, HER2-) - Signed by Nicholas Lose, MD on 08/31/2018    10/11/2018 Surgery    Left lumpectomy: Grade 2 IDC with DCIS, 1 cm, margins negative, DCIS focally 0.1 cm from superior margin, 2/2 lymph nodes positive, ER 95%, PR 45%, HER-2 negative, Ki-67 20% AXLND: 3/20 LN Pos (total 5/22)    10/18/2018 Cancer Staging    Staging form: Breast, AJCC 8th Edition - Pathologic stage from 10/18/2018: Stage IB (pT1b, pN2a, cM0, G2, ER+, PR+, HER2-) - Signed by Nicholas Lose, MD on 11/14/2018    12/15/2018 -  Chemotherapy    The patient had DOXOrubicin (ADRIAMYCIN) chemo injection 142 mg, 60 mg/m2 = 142 mg, Intravenous,  Once, 1 of 4 cycles Administration: 142 mg (12/15/2018) palonosetron (ALOXI) injection 0.25 mg, 0.25 mg, Intravenous,  Once, 1 of 4 cycles Administration: 0.25 mg (12/15/2018) pegfilgrastim-cbqv (UDENYCA) injection 6 mg, 6 mg, Subcutaneous, Once, 1 of 4 cycles  Administration: 6 mg (12/17/2018) cyclophosphamide (CYTOXAN) 1,420 mg in sodium chloride 0.9 % 250 mL chemo infusion, 600 mg/m2 = 1,420 mg, Intravenous,  Once, 1 of 4 cycles Administration: 1,420 mg (12/15/2018) PACLitaxel (TAXOL) 192 mg in sodium chloride 0.9 % 250 mL chemo infusion (</= 20m/m2), 80 mg/m2 = 192 mg, Intravenous,  Once, 0 of 12 cycles fosaprepitant (EMEND) 150 mg, dexamethasone (DECADRON) 12 mg in sodium chloride 0.9 % 145 mL IVPB, , Intravenous,  Once, 1 of 4 cycles Administration:  (12/15/2018)  for chemotherapy treatment.      CHIEF COMPLIANT: Cycle 2 Adriamycin and Cytoxan  INTERVAL HISTORY: Kayla Banks a 63y.o. with above-mentioned history of left breast cancer who underwent a lumpectomy followed by an axillary lymph node dissection and is currently on adjuvant chemotherapy with dose dense Adriamycin and Cytoxan. She presents to the clinic today for cycle 2.  She has taken nausea medicines to preemptively avoid getting sick.  She has done extremely well.  Energy levels are back to normal.  Denies any fevers or chills.  REVIEW OF SYSTEMS:   Constitutional: Denies fevers, chills or abnormal weight loss Eyes: Denies blurriness of vision Ears, nose, mouth, throat, and face: Denies mucositis or sore throat Respiratory: Denies cough, dyspnea or wheezes Cardiovascular: Denies palpitation, chest discomfort Gastrointestinal: Denies nausea, heartburn or change in bowel habits Skin: Denies abnormal skin rashes Lymphatics: Denies new lymphadenopathy or easy bruising Neurological: Denies numbness, tingling or new weaknesses Behavioral/Psych: Mood is stable, no new changes  Extremities: No  lower extremity edema Breast: denies any pain or lumps or nodules in either breasts All other systems were reviewed with the patient and are negative.  I have reviewed the past medical history, past surgical history, social history and family history with the patient and they are  unchanged from previous note.  ALLERGIES:  is allergic to adhesive [tape]; erythromycin; lisinopril; and penicillins.  MEDICATIONS:  Current Outpatient Medications  Medication Sig Dispense Refill  . amLODipine (NORVASC) 5 MG tablet Take 1 tablet (5 mg total) by mouth daily. 90 tablet 1  . carvedilol (COREG) 25 MG tablet TAKE 1 TABLET(25 MG) BY MOUTH TWICE DAILY WITH A MEAL 180 tablet 1  . dexamethasone (DECADRON) 4 MG tablet Take1 tab day after chemo and 1 tab 2 days after chemo with food 8 tablet 0  . EPINEPHRINE 0.3 mg/0.3 mL IJ SOAJ injection INJECT 0.3 ML(1 SYRINGE) IN THE MUSCLE AS NEEDED FOR ALLERGIC REACTION 2 Device 0  . furosemide (LASIX) 20 MG tablet TAKE 1 TABLET(20 MG) BY MOUTH DAILY 90 tablet 1  . HYDROcodone-acetaminophen (NORCO/VICODIN) 5-325 MG tablet Take 1 tablet by mouth every 6 (six) hours as needed for moderate pain. 12 tablet 0  . lidocaine-prilocaine (EMLA) cream Apply to affected area once 30 g 3  . LORazepam (ATIVAN) 0.5 MG tablet Take 1 tablet (0.5 mg total) by mouth at bedtime as needed for sleep. 30 tablet 0  . meloxicam (MOBIC) 15 MG tablet TAKE 1 TABLET(15 MG) BY MOUTH DAILY AS NEEDED FOR PAIN 90 tablet 3  . ondansetron (ZOFRAN) 8 MG tablet Take 1 tablet (8 mg total) by mouth 2 (two) times daily as needed. Start on the third day after chemotherapy. 30 tablet 1  . prochlorperazine (COMPAZINE) 10 MG tablet Take 1 tablet (10 mg total) by mouth every 6 (six) hours as needed (Nausea or vomiting). 30 tablet 1  . SYNTHROID 100 MCG tablet 1 po qd 90 tablet 3  . valACYclovir (VALTREX) 1000 MG tablet TAKE 1 TABLET BY MOUTH THREE TIMES DAILY, DISCONTINUE ALL PREVIOUS REFILLS FOR THIS MEDICATION 30 tablet 5   No current facility-administered medications for this visit.     PHYSICAL EXAMINATION: ECOG PERFORMANCE STATUS: 1 - Symptomatic but completely ambulatory  Vitals:   12/29/18 0924  BP: (!) 142/80  Pulse: 78  Resp: 17  Temp: 98.5 F (36.9 C)  SpO2: 100%    Filed Weights   12/29/18 0924  Weight: 265 lb 6.4 oz (120.4 kg)    GENERAL: alert, no distress and comfortable SKIN: skin color, texture, turgor are normal, no rashes or significant lesions EYES: normal, Conjunctiva are pink and non-injected, sclera clear OROPHARYNX: no exudate, no erythema and lips, buccal mucosa, and tongue normal  NECK: supple, thyroid normal size, non-tender, without nodularity LYMPH: no palpable lymphadenopathy in the cervical, axillary or inguinal LUNGS: clear to auscultation and percussion with normal breathing effort HEART: regular rate & rhythm and no murmurs and no lower extremity edema ABDOMEN: abdomen soft, non-tender and normal bowel sounds MUSCULOSKELETAL: no cyanosis of digits and no clubbing  NEURO: alert & oriented x 3 with fluent speech, no focal motor/sensory deficits EXTREMITIES: No lower extremity edema  LABORATORY DATA:  I have reviewed the data as listed CMP Latest Ref Rng & Units 12/29/2018 12/22/2018 12/15/2018  Glucose 70 - 99 mg/dL 135(H) 119(H) 106(H)  BUN 6 - 20 mg/dL 11 11 14   Creatinine 0.44 - 1.00 mg/dL 0.80 0.72 0.74  Sodium 135 - 145 mmol/L 142 140 143  Potassium  3.5 - 5.1 mmol/L 3.9 4.1 3.9  Chloride 98 - 111 mmol/L 106 104 107  CO2 22 - 32 mmol/L 26 29 26   Calcium 8.9 - 10.3 mg/dL 8.9 8.9 8.5(L)  Total Protein 6.5 - 8.1 g/dL 7.0 6.8 6.9  Total Bilirubin 0.3 - 1.2 mg/dL 0.3 0.6 0.8  Alkaline Phos 38 - 126 U/L 98 105 74  AST 15 - 41 U/L 13(L) 10(L) 12(L)  ALT 0 - 44 U/L 13 11 10     Lab Results  Component Value Date   WBC 12.2 (H) 12/29/2018   HGB 12.4 12/29/2018   HCT 39.3 12/29/2018   MCV 86.6 12/29/2018   PLT 197 12/29/2018   NEUTROABS 8.7 (H) 12/29/2018    ASSESSMENT & PLAN:  Malignant neoplasm of upper-outer quadrant of left breast in female, estrogen receptor positive ([facility]) Left lumpectomy: Grade 2 IDC with DCIS, 1 cm, margins negative, DCIS focally 0.1 cm from superior margin, 2/2 lymph nodes positive, ER 95%, PR  45%, HER-2 negative, Ki-67 20% AXLND: 3/20 LN Pos (total 5/22)  Treatment Plan: 1. Adj chemo with DD Adriamycin and Cytoxan followed by Taxol weekly X 12 2. Adj XRT 3. Adj Anti estrogen therapy --------------------------------------------------------------------------------------------- Current treatment: Cycle 2 day 1 dose dense Adriamycin and Cytoxan Chemo toxicities: Mild nausea but otherwise tolerated extremely well. Labs reviewed I instructed her to drink more water and eat more protein in the diet.  Return to clinic in 2 weeks for cycle 3    No orders of the defined types were placed in this encounter.  The patient has a good understanding of the overall plan. she agrees with it. she will call with any problems that may develop before the next visit here.  Nicholas Lose, MD 12/29/2018  Julious Oka Dorshimer am acting as scribe for Dr. Nicholas Lose.  I have reviewed the above documentation for accuracy and completeness, and I agree with the above.

## 2018-12-29 ENCOUNTER — Inpatient Hospital Stay: Payer: BC Managed Care – PPO

## 2018-12-29 ENCOUNTER — Other Ambulatory Visit: Payer: Self-pay

## 2018-12-29 ENCOUNTER — Encounter: Payer: Self-pay | Admitting: *Deleted

## 2018-12-29 ENCOUNTER — Inpatient Hospital Stay: Payer: BC Managed Care – PPO | Admitting: Hematology and Oncology

## 2018-12-29 DIAGNOSIS — Z7982 Long term (current) use of aspirin: Secondary | ICD-10-CM

## 2018-12-29 DIAGNOSIS — R110 Nausea: Secondary | ICD-10-CM

## 2018-12-29 DIAGNOSIS — Z170 Estrogen receptor positive status [ER+]: Secondary | ICD-10-CM

## 2018-12-29 DIAGNOSIS — Z7689 Persons encountering health services in other specified circumstances: Secondary | ICD-10-CM | POA: Diagnosis not present

## 2018-12-29 DIAGNOSIS — C50412 Malignant neoplasm of upper-outer quadrant of left female breast: Secondary | ICD-10-CM

## 2018-12-29 DIAGNOSIS — Z95828 Presence of other vascular implants and grafts: Secondary | ICD-10-CM

## 2018-12-29 DIAGNOSIS — Z79899 Other long term (current) drug therapy: Secondary | ICD-10-CM

## 2018-12-29 DIAGNOSIS — Z9221 Personal history of antineoplastic chemotherapy: Secondary | ICD-10-CM

## 2018-12-29 LAB — CBC WITH DIFFERENTIAL (CANCER CENTER ONLY)
Abs Immature Granulocytes: 0.73 10*3/uL — ABNORMAL HIGH (ref 0.00–0.07)
Basophils Absolute: 0.1 10*3/uL (ref 0.0–0.1)
Basophils Relative: 1 %
Eosinophils Absolute: 0 10*3/uL (ref 0.0–0.5)
Eosinophils Relative: 0 %
HCT: 39.3 % (ref 36.0–46.0)
Hemoglobin: 12.4 g/dL (ref 12.0–15.0)
Immature Granulocytes: 6 %
Lymphocytes Relative: 15 %
Lymphs Abs: 1.8 10*3/uL (ref 0.7–4.0)
MCH: 27.3 pg (ref 26.0–34.0)
MCHC: 31.6 g/dL (ref 30.0–36.0)
MCV: 86.6 fL (ref 80.0–100.0)
Monocytes Absolute: 0.9 10*3/uL (ref 0.1–1.0)
Monocytes Relative: 7 %
Neutro Abs: 8.7 10*3/uL — ABNORMAL HIGH (ref 1.7–7.7)
Neutrophils Relative %: 71 %
Platelet Count: 197 10*3/uL (ref 150–400)
RBC: 4.54 MIL/uL (ref 3.87–5.11)
RDW: 14.6 % (ref 11.5–15.5)
WBC Count: 12.2 10*3/uL — ABNORMAL HIGH (ref 4.0–10.5)
nRBC: 0 % (ref 0.0–0.2)

## 2018-12-29 LAB — CMP (CANCER CENTER ONLY)
ALT: 13 U/L (ref 0–44)
AST: 13 U/L — ABNORMAL LOW (ref 15–41)
Albumin: 3.4 g/dL — ABNORMAL LOW (ref 3.5–5.0)
Alkaline Phosphatase: 98 U/L (ref 38–126)
Anion gap: 10 (ref 5–15)
BUN: 11 mg/dL (ref 6–20)
CO2: 26 mmol/L (ref 22–32)
Calcium: 8.9 mg/dL (ref 8.9–10.3)
Chloride: 106 mmol/L (ref 98–111)
Creatinine: 0.8 mg/dL (ref 0.44–1.00)
GFR, Est AFR Am: >60 mL/min (ref >60)
GFR, Estimated: >60 mL/min (ref >60)
Glucose, Bld: 135 mg/dL — ABNORMAL HIGH (ref 70–99)
Potassium: 3.9 mmol/L (ref 3.5–5.1)
Sodium: 142 mmol/L (ref 135–145)
Total Bilirubin: 0.3 mg/dL (ref 0.3–1.2)
Total Protein: 7 g/dL (ref 6.5–8.1)

## 2018-12-29 MED ORDER — PALONOSETRON HCL INJECTION 0.25 MG/5ML
0.2500 mg | Freq: Once | INTRAVENOUS | Status: AC
  Administered 2018-12-29: 10:00:00 0.25 mg via INTRAVENOUS

## 2018-12-29 MED ORDER — SODIUM CHLORIDE 0.9% FLUSH
10.0000 mL | INTRAVENOUS | Status: DC | PRN
  Administered 2018-12-29: 09:00:00 10 mL
  Filled 2018-12-29: qty 10

## 2018-12-29 MED ORDER — PALONOSETRON HCL INJECTION 0.25 MG/5ML
INTRAVENOUS | Status: AC
  Filled 2018-12-29: qty 5

## 2018-12-29 MED ORDER — SODIUM CHLORIDE 0.9 % IV SOLN
600.0000 mg/m2 | Freq: Once | INTRAVENOUS | Status: AC
  Administered 2018-12-29: 12:00:00 1420 mg via INTRAVENOUS
  Filled 2018-12-29: qty 71

## 2018-12-29 MED ORDER — SODIUM CHLORIDE 0.9 % IV SOLN
Freq: Once | INTRAVENOUS | Status: AC
  Administered 2018-12-29: 10:00:00 via INTRAVENOUS
  Filled 2018-12-29: qty 250

## 2018-12-29 MED ORDER — SODIUM CHLORIDE 0.9 % IV SOLN
Freq: Once | INTRAVENOUS | Status: AC
  Administered 2018-12-29: 10:00:00 via INTRAVENOUS
  Filled 2018-12-29: qty 5

## 2018-12-29 MED ORDER — HEPARIN SOD (PORK) LOCK FLUSH 100 UNIT/ML IV SOLN
500.0000 [IU] | Freq: Once | INTRAVENOUS | Status: AC | PRN
  Administered 2018-12-29: 500 [IU]
  Filled 2018-12-29: qty 5

## 2018-12-29 MED ORDER — DOXORUBICIN HCL CHEMO IV INJECTION 2 MG/ML
60.0000 mg/m2 | Freq: Once | INTRAVENOUS | Status: AC
  Administered 2018-12-29: 142 mg via INTRAVENOUS
  Filled 2018-12-29: qty 71

## 2018-12-29 MED ORDER — SODIUM CHLORIDE 0.9% FLUSH
10.0000 mL | INTRAVENOUS | Status: DC | PRN
  Administered 2018-12-29: 10 mL
  Filled 2018-12-29: qty 10

## 2018-12-29 NOTE — Patient Instructions (Signed)
[facility] Discharge Instructions for Patients Receiving Chemotherapy  Today you received the following chemotherapy agents Adriamycin and Cytoxan  To help prevent nausea and vomiting after your treatment, we encourage you to take your nausea medication as directed.  If you develop nausea and vomiting that is not controlled by your nausea medication, call the clinic.   BELOW ARE SYMPTOMS THAT SHOULD BE REPORTED IMMEDIATELY:  *FEVER GREATER THAN 100.5 F  *CHILLS WITH OR WITHOUT FEVER  NAUSEA AND VOMITING THAT IS NOT CONTROLLED WITH YOUR NAUSEA MEDICATION  *UNUSUAL SHORTNESS OF BREATH  *UNUSUAL BRUISING OR BLEEDING  TENDERNESS IN MOUTH AND THROAT WITH OR WITHOUT PRESENCE OF ULCERS  *URINARY PROBLEMS  *BOWEL PROBLEMS  UNUSUAL RASH Items with * indicate a potential emergency and should be followed up as soon as possible.  Feel free to call the clinic should you have any questions or concerns. The clinic phone number is [phone].  Please show the CHEMO ALERT CARD at check-in to the Emergency Department and triage nurse.   

## 2018-12-31 ENCOUNTER — Other Ambulatory Visit: Payer: Self-pay

## 2018-12-31 ENCOUNTER — Inpatient Hospital Stay: Payer: BC Managed Care – PPO

## 2018-12-31 VITALS — BP 163/73 | HR 66 | Temp 98.9°F | Resp 16

## 2018-12-31 DIAGNOSIS — C50412 Malignant neoplasm of upper-outer quadrant of left female breast: Secondary | ICD-10-CM

## 2018-12-31 MED ORDER — PEGFILGRASTIM-CBQV 6 MG/0.6ML ~~LOC~~ SOSY
6.0000 mg | PREFILLED_SYRINGE | Freq: Once | SUBCUTANEOUS | Status: AC
  Administered 2018-12-31: 6 mg via SUBCUTANEOUS

## 2018-12-31 MED ORDER — PEGFILGRASTIM-CBQV 6 MG/0.6ML ~~LOC~~ SOSY
PREFILLED_SYRINGE | SUBCUTANEOUS | Status: AC
  Filled 2018-12-31: qty 0.6

## 2018-12-31 NOTE — Patient Instructions (Signed)
Filgrastim, G-CSF injection What is this medicine? FILGRASTIM, G-CSF (fil GRA stim) is a granulocyte colony-stimulating factor that stimulates the growth of neutrophils, a type of white blood cell (WBC) important in the body's fight against infection. It is used to reduce the incidence of fever and infection in patients with certain types of cancer who are receiving chemotherapy that affects the bone marrow, to stimulate blood cell production for removal of WBCs from the body prior to a bone marrow transplantation, to reduce the incidence of fever and infection in patients who have severe chronic neutropenia, and to improve survival outcomes following high-dose radiation exposure that is toxic to the bone marrow. This medicine may be used for other purposes; ask your health care provider or pharmacist if you have questions. COMMON BRAND NAME(S): Neupogen, Nivestym, Zarxio What should I tell my health care provider before I take this medicine? They need to know if you have any of these conditions: -kidney disease -latex allergy -ongoing radiation therapy -sickle cell disease -an unusual or allergic reaction to filgrastim, pegfilgrastim, other medicines, foods, dyes, or preservatives -pregnant or trying to get pregnant -breast-feeding How should I use this medicine? This medicine is for injection under the skin or infusion into a vein. As an infusion into a vein, it is usually given by a health care professional in a hospital or clinic setting. If you get this medicine at home, you will be taught how to prepare and give this medicine. Refer to the Instructions for Use that come with your medication packaging. Use exactly as directed. Take your medicine at regular intervals. Do not take your medicine more often than directed. It is important that you put your used needles and syringes in a special sharps container. Do not put them in a trash can. If you do not have a sharps container, call your  pharmacist or healthcare provider to get one. Talk to your pediatrician regarding the use of this medicine in children. While this drug may be prescribed for children as young as 7 months for selected conditions, precautions do apply. Overdosage: If you think you have taken too much of this medicine contact a poison control center or emergency room at once. NOTE: This medicine is only for you. Do not share this medicine with others. What if I miss a dose? It is important not to miss your dose. Call your doctor or health care professional if you miss a dose. What may interact with this medicine? This medicine may interact with the following medications: -medicines that may cause a release of neutrophils, such as lithium This list may not describe all possible interactions. Give your health care provider a list of all the medicines, herbs, non-prescription drugs, or dietary supplements you use. Also tell them if you smoke, drink alcohol, or use illegal drugs. Some items may interact with your medicine. What should I watch for while using this medicine? You may need blood work done while you are taking this medicine. What side effects may I notice from receiving this medicine? Side effects that you should report to your doctor or health care professional as soon as possible: -allergic reactions like skin rash, itching or hives, swelling of the face, lips, or tongue -back pain -dizziness or feeling faint -fever -pain, redness, or irritation at site where injected -pinpoint red spots on the skin -shortness of breath or breathing problems -signs and symptoms of kidney injury like trouble passing urine, change in the amount of urine, or red or dark-brown urine -stomach   or side pain, or pain at the shoulder -swelling -tiredness -unusual bleeding or bruising Side effects that usually do not require medical attention (report to your doctor or health care professional if they continue or are  bothersome): -bone pain -cough -diarrhea -hair loss -headache -muscle pain This list may not describe all possible side effects. Call your doctor for medical advice about side effects. You may report side effects to FDA at 1-800-FDA-1088. Where should I keep my medicine? Keep out of the reach of children. Store in a refrigerator between 2 and 8 degrees C (36 and 46 degrees F). Do not freeze. Keep in carton to protect from light. Throw away this medicine if vials or syringes are left out of the refrigerator for more than 24 hours. Throw away any unused medicine after the expiration date. NOTE: This sheet is a summary. It may not cover all possible information. If you have questions about this medicine, talk to your doctor, pharmacist, or health care provider.  2019 Elsevier/Gold Standard (2018-03-18 15:39:45)  

## 2019-01-05 NOTE — Assessment & Plan Note (Signed)
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 Adj chemo with DD Adriamycin and Cytoxan followed by Taxol weekly X 12 2. Adj XRT 3. Adj Anti estrogen therapy --------------------------------------------------------------------------------------------- Current treatment: Cycle 3 day1dose dense Adriamycin and Cytoxan Chemo toxicities:Mild nausea but otherwise tolerated extremely well. Labs reviewed I instructed her to drink more water and eat more protein in the diet.  Return to clinic in 2 weeks for cycle 4

## 2019-01-11 ENCOUNTER — Telehealth: Payer: Self-pay | Admitting: Hematology and Oncology

## 2019-01-11 NOTE — Progress Notes (Signed)
Patient Care Team: Carollee Herter, Alferd Apa, DO as PCP - General Dorna Leitz, MD as Consulting Physician (Orthopedic Surgery) Erroll Luna, MD as Consulting Physician (General Surgery) Nicholas Lose, MD as Consulting Physician (Hematology and Oncology) Kyung Rudd, MD as Consulting Physician (Radiation Oncology)  DIAGNOSIS:    ICD-10-CM   1. Malignant neoplasm of upper-outer quadrant of left breast in female, estrogen receptor positive ([place]) C50.412    Z17.0     SUMMARY OF ONCOLOGIC HISTORY:   Malignant neoplasm of upper-outer quadrant of left breast in female, estrogen receptor positive ([place])   08/24/2018 Initial Biopsy    Screening detected left breast mass at 2:30 position 6 mm in size with one abnormal lymph node and stable benign calcifications, biopsy of the mass grade 2 IDC with DCIS and lymphovascular invasion, lymph node biopsy is positive, ER 95%, PR 40%, Ki-67 20%, HER-2 -1+ by IHC, T1b N1 stage Ib    08/31/2018 Cancer Staging    Staging form: Breast, AJCC 8th Edition - Clinical: Stage IB (cT1b, cN1(f), cM0, G2, ER+, PR+, HER2-) - Signed by Nicholas Lose, MD on 08/31/2018    10/11/2018 Surgery    Left lumpectomy: Grade 2 IDC with DCIS, 1 cm, margins negative, DCIS focally 0.1 cm from superior margin, 2/2 lymph nodes positive, ER 95%, PR 45%, HER-2 negative, Ki-67 20% AXLND: 3/20 LN Pos (total 5/22)    10/18/2018 Cancer Staging    Staging form: Breast, AJCC 8th Edition - Pathologic stage from 10/18/2018: Stage IB (pT1b, pN2a, cM0, G2, ER+, PR+, HER2-) - Signed by Nicholas Lose, MD on 11/14/2018    12/15/2018 -  Chemotherapy    The patient had DOXOrubicin (ADRIAMYCIN) chemo injection 142 mg, 60 mg/m2 = 142 mg, Intravenous,  Once, 2 of 4 cycles Administration: 142 mg (12/15/2018), 142 mg (12/29/2018) palonosetron (ALOXI) injection 0.25 mg, 0.25 mg, Intravenous,  Once, 2 of 4 cycles Administration: 0.25 mg (12/15/2018), 0.25 mg (12/29/2018) pegfilgrastim-cbqv (UDENYCA) injection 6  mg, 6 mg, Subcutaneous, Once, 2 of 4 cycles Administration: 6 mg (12/17/2018), 6 mg (12/31/2018) cyclophosphamide (CYTOXAN) 1,420 mg in sodium chloride 0.9 % 250 mL chemo infusion, 600 mg/m2 = 1,420 mg, Intravenous,  Once, 2 of 4 cycles Administration: 1,420 mg (12/15/2018), 1,420 mg (12/29/2018) PACLitaxel (TAXOL) 192 mg in sodium chloride 0.9 % 250 mL chemo infusion (</= 34m/m2), 80 mg/m2 = 192 mg, Intravenous,  Once, 0 of 12 cycles fosaprepitant (EMEND) 150 mg, dexamethasone (DECADRON) 12 mg in sodium chloride 0.9 % 145 mL IVPB, , Intravenous,  Once, 2 of 4 cycles Administration:  (12/15/2018),  (12/29/2018)  for chemotherapy treatment.      CHIEF COMPLIANT: Cycle 3 Adriamycin and Cytoxan  INTERVAL HISTORY: Kayla Cartieris a 63y.o. with above-mentioned history of left breast cancer who underwent a lumpectomy followed by an axillary lymph node dissection and iscurrently onadjuvant chemotherapy with dose dense Adriamycin and Cytoxan. She presents to the clinic todayfor cycle 3.   REVIEW OF SYSTEMS:   Constitutional: Denies fevers, chills or abnormal weight loss Eyes: Denies blurriness of vision Ears, nose, mouth, throat, and face: Denies mucositis or sore throat Respiratory: Denies cough, dyspnea or wheezes Cardiovascular: Denies palpitation, chest discomfort Gastrointestinal: Denies nausea, heartburn or change in bowel habits Skin: Denies abnormal skin rashes Lymphatics: Denies new lymphadenopathy or easy bruising Neurological: Denies numbness, tingling or new weaknesses Behavioral/Psych: Mood is stable, no new changes  Extremities: No lower extremity edema Breast: denies any pain or lumps or nodules in either breasts All other systems were  reviewed with the patient and are negative.  I have reviewed the past medical history, past surgical history, social history and family history with the patient and they are unchanged from previous note.  ALLERGIES:  is allergic to adhesive  [tape]; erythromycin; lisinopril; and penicillins.  MEDICATIONS:  Current Outpatient Medications  Medication Sig Dispense Refill  . amLODipine (NORVASC) 5 MG tablet Take 1 tablet (5 mg total) by mouth daily. 90 tablet 1  . carvedilol (COREG) 25 MG tablet TAKE 1 TABLET(25 MG) BY MOUTH TWICE DAILY WITH A MEAL 180 tablet 1  . dexamethasone (DECADRON) 4 MG tablet Take1 tab day after chemo and 1 tab 2 days after chemo with food 8 tablet 0  . EPINEPHRINE 0.3 mg/0.3 mL IJ SOAJ injection INJECT 0.3 ML(1 SYRINGE) IN THE MUSCLE AS NEEDED FOR ALLERGIC REACTION 2 Device 0  . furosemide (LASIX) 20 MG tablet TAKE 1 TABLET(20 MG) BY MOUTH DAILY 90 tablet 1  . HYDROcodone-acetaminophen (NORCO/VICODIN) 5-325 MG tablet Take 1 tablet by mouth every 6 (six) hours as needed for moderate pain. 12 tablet 0  . lidocaine-prilocaine (EMLA) cream Apply to affected area once 30 g 3  . LORazepam (ATIVAN) 0.5 MG tablet Take 1 tablet (0.5 mg total) by mouth at bedtime as needed for sleep. 30 tablet 0  . meloxicam (MOBIC) 15 MG tablet TAKE 1 TABLET(15 MG) BY MOUTH DAILY AS NEEDED FOR PAIN 90 tablet 3  . ondansetron (ZOFRAN) 8 MG tablet Take 1 tablet (8 mg total) by mouth 2 (two) times daily as needed. Start on the third day after chemotherapy. 30 tablet 1  . prochlorperazine (COMPAZINE) 10 MG tablet Take 1 tablet (10 mg total) by mouth every 6 (six) hours as needed (Nausea or vomiting). 30 tablet 1  . SYNTHROID 100 MCG tablet 1 po qd 90 tablet 3  . valACYclovir (VALTREX) 1000 MG tablet TAKE 1 TABLET BY MOUTH THREE TIMES DAILY, DISCONTINUE ALL PREVIOUS REFILLS FOR THIS MEDICATION 30 tablet 5   No current facility-administered medications for this visit.     PHYSICAL EXAMINATION: ECOG PERFORMANCE STATUS: 1 - Symptomatic but completely ambulatory  Vitals:   01/12/19 0926  BP: (!) 156/82  Pulse: 74  Resp: 18  Temp: (!) 97.4 F (36.3 C)  SpO2: 100%   Filed Weights   01/12/19 0926  Weight: 268 lb 4.8 oz (121.7 kg)     Exam not performed.  COVID-19 precautions  LABORATORY DATA:  I have reviewed the data as listed CMP Latest Ref Rng & Units 12/29/2018 12/22/2018 12/15/2018  Glucose 70 - 99 mg/dL 135(H) 119(H) 106(H)  BUN 6 - 20 mg/dL 11 11 14   Creatinine 0.44 - 1.00 mg/dL 0.80 0.72 0.74  Sodium 135 - 145 mmol/L 142 140 143  Potassium 3.5 - 5.1 mmol/L 3.9 4.1 3.9  Chloride 98 - 111 mmol/L 106 104 107  CO2 22 - 32 mmol/L 26 29 26   Calcium 8.9 - 10.3 mg/dL 8.9 8.9 8.5(L)  Total Protein 6.5 - 8.1 g/dL 7.0 6.8 6.9  Total Bilirubin 0.3 - 1.2 mg/dL 0.3 0.6 0.8  Alkaline Phos 38 - 126 U/L 98 105 74  AST 15 - 41 U/L 13(L) 10(L) 12(L)  ALT 0 - 44 U/L 13 11 10     Lab Results  Component Value Date   WBC 13.5 (H) 01/12/2019   HGB 11.7 (L) 01/12/2019   HCT 36.9 01/12/2019   MCV 88.5 01/12/2019   PLT 163 01/12/2019   NEUTROABS PENDING 01/12/2019    ASSESSMENT &  PLAN:  Malignant neoplasm of upper-outer quadrant of left breast in female, estrogen receptor positive ([place]) Left lumpectomy: Grade 2 IDC with DCIS, 1 cm, margins negative, DCIS focally 0.1 cm from superior margin, 2/2 lymph nodes positive, ER 95%, PR 45%, HER-2 negative, Ki-67 20% AXLND: 3/20 LN Pos (total 5/22)  Treatment Plan: 1. Adj chemo with DD Adriamycin and Cytoxan followed by Taxol weekly X 12 2. Adj XRT 3. Adj Anti estrogen therapy --------------------------------------------------------------------------------------------- Current treatment: Cycle 3 day1dose dense Adriamycin and Cytoxan Chemo toxicities:Mild nausea but otherwise tolerated extremely well. Labs reviewed I instructed her to drink more water and eat more protein in the diet.  Return to clinic in 2 weeks for cycle 4    No orders of the defined types were placed in this encounter.  The patient has a good understanding of the overall plan. she agrees with it. she will call with any problems that may develop before the next visit here.  Nicholas Lose, MD  01/12/2019  Julious Oka Dorshimer am acting as scribe for Dr. Nicholas Lose.  I have reviewed the above documentation for accuracy and completeness, and I agree with the above.

## 2019-01-11 NOTE — Telephone Encounter (Signed)
Scheduled appt per sch msg. Called and left msg for patient.  °

## 2019-01-12 ENCOUNTER — Inpatient Hospital Stay (HOSPITAL_BASED_OUTPATIENT_CLINIC_OR_DEPARTMENT_OTHER): Payer: BC Managed Care – PPO | Admitting: Hematology and Oncology

## 2019-01-12 ENCOUNTER — Inpatient Hospital Stay: Payer: BC Managed Care – PPO

## 2019-01-12 ENCOUNTER — Encounter: Payer: Self-pay | Admitting: *Deleted

## 2019-01-12 ENCOUNTER — Telehealth: Payer: Self-pay | Admitting: *Deleted

## 2019-01-12 ENCOUNTER — Other Ambulatory Visit: Payer: Self-pay

## 2019-01-12 DIAGNOSIS — Z170 Estrogen receptor positive status [ER+]: Secondary | ICD-10-CM

## 2019-01-12 DIAGNOSIS — C50412 Malignant neoplasm of upper-outer quadrant of left female breast: Secondary | ICD-10-CM

## 2019-01-12 DIAGNOSIS — Z79899 Other long term (current) drug therapy: Secondary | ICD-10-CM

## 2019-01-12 DIAGNOSIS — Z95828 Presence of other vascular implants and grafts: Secondary | ICD-10-CM

## 2019-01-12 DIAGNOSIS — Z5111 Encounter for antineoplastic chemotherapy: Secondary | ICD-10-CM

## 2019-01-12 DIAGNOSIS — Z7689 Persons encountering health services in other specified circumstances: Secondary | ICD-10-CM | POA: Diagnosis not present

## 2019-01-12 DIAGNOSIS — R110 Nausea: Secondary | ICD-10-CM

## 2019-01-12 DIAGNOSIS — Z9221 Personal history of antineoplastic chemotherapy: Secondary | ICD-10-CM

## 2019-01-12 DIAGNOSIS — Z7982 Long term (current) use of aspirin: Secondary | ICD-10-CM

## 2019-01-12 LAB — CBC WITH DIFFERENTIAL (CANCER CENTER ONLY)
Abs Immature Granulocytes: 0.8 10*3/uL — ABNORMAL HIGH (ref 0.00–0.07)
Basophils Absolute: 0.1 10*3/uL (ref 0.0–0.1)
Basophils Relative: 1 %
Eosinophils Absolute: 0 10*3/uL (ref 0.0–0.5)
Eosinophils Relative: 0 %
HCT: 36.9 % (ref 36.0–46.0)
Hemoglobin: 11.7 g/dL — ABNORMAL LOW (ref 12.0–15.0)
Immature Granulocytes: 6 %
Lymphocytes Relative: 12 %
Lymphs Abs: 1.6 10*3/uL (ref 0.7–4.0)
MCH: 28.1 pg (ref 26.0–34.0)
MCHC: 31.7 g/dL (ref 30.0–36.0)
MCV: 88.5 fL (ref 80.0–100.0)
Monocytes Absolute: 0.9 10*3/uL (ref 0.1–1.0)
Monocytes Relative: 7 %
Neutro Abs: 10.1 10*3/uL — ABNORMAL HIGH (ref 1.7–7.7)
Neutrophils Relative %: 74 %
Platelet Count: 163 10*3/uL (ref 150–400)
RBC: 4.17 MIL/uL (ref 3.87–5.11)
RDW: 15.5 % (ref 11.5–15.5)
WBC Count: 13.5 10*3/uL — ABNORMAL HIGH (ref 4.0–10.5)
nRBC: 0 % (ref 0.0–0.2)

## 2019-01-12 LAB — CMP (CANCER CENTER ONLY)
ALT: 16 U/L (ref 0–44)
AST: 13 U/L — ABNORMAL LOW (ref 15–41)
Albumin: 3.2 g/dL — ABNORMAL LOW (ref 3.5–5.0)
Alkaline Phosphatase: 90 U/L (ref 38–126)
Anion gap: 6 (ref 5–15)
BUN: 15 mg/dL (ref 6–20)
CO2: 27 mmol/L (ref 22–32)
Calcium: 8.4 mg/dL — ABNORMAL LOW (ref 8.9–10.3)
Chloride: 108 mmol/L (ref 98–111)
Creatinine: 0.68 mg/dL (ref 0.44–1.00)
GFR, Est AFR Am: >60 mL/min (ref >60)
GFR, Estimated: >60 mL/min (ref >60)
Glucose, Bld: 101 mg/dL — ABNORMAL HIGH (ref 70–99)
Potassium: 4 mmol/L (ref 3.5–5.1)
Sodium: 141 mmol/L (ref 135–145)
Total Bilirubin: 0.3 mg/dL (ref 0.3–1.2)
Total Protein: 6.3 g/dL — ABNORMAL LOW (ref 6.5–8.1)

## 2019-01-12 MED ORDER — PALONOSETRON HCL INJECTION 0.25 MG/5ML
INTRAVENOUS | Status: AC
  Filled 2019-01-12: qty 5

## 2019-01-12 MED ORDER — PALONOSETRON HCL INJECTION 0.25 MG/5ML
0.2500 mg | Freq: Once | INTRAVENOUS | Status: AC
  Administered 2019-01-12: 10:00:00 0.25 mg via INTRAVENOUS

## 2019-01-12 MED ORDER — SODIUM CHLORIDE 0.9 % IV SOLN
Freq: Once | INTRAVENOUS | Status: AC
  Administered 2019-01-12: 10:00:00 via INTRAVENOUS
  Filled 2019-01-12: qty 5

## 2019-01-12 MED ORDER — DOXORUBICIN HCL CHEMO IV INJECTION 2 MG/ML
60.0000 mg/m2 | Freq: Once | INTRAVENOUS | Status: AC
  Administered 2019-01-12: 142 mg via INTRAVENOUS
  Filled 2019-01-12: qty 71

## 2019-01-12 MED ORDER — SODIUM CHLORIDE 0.9% FLUSH
10.0000 mL | INTRAVENOUS | Status: DC | PRN
  Administered 2019-01-12: 10 mL
  Filled 2019-01-12: qty 10

## 2019-01-12 MED ORDER — SODIUM CHLORIDE 0.9 % IV SOLN
600.0000 mg/m2 | Freq: Once | INTRAVENOUS | Status: AC
  Administered 2019-01-12: 1420 mg via INTRAVENOUS
  Filled 2019-01-12: qty 71

## 2019-01-12 MED ORDER — SODIUM CHLORIDE 0.9 % IV SOLN
Freq: Once | INTRAVENOUS | Status: AC
  Administered 2019-01-12: 10:00:00 via INTRAVENOUS
  Filled 2019-01-12: qty 250

## 2019-01-12 MED ORDER — SODIUM CHLORIDE 0.9% FLUSH
10.0000 mL | INTRAVENOUS | Status: DC | PRN
  Administered 2019-01-12: 12:00:00 10 mL
  Filled 2019-01-12: qty 10

## 2019-01-12 MED ORDER — HEPARIN SOD (PORK) LOCK FLUSH 100 UNIT/ML IV SOLN
500.0000 [IU] | Freq: Once | INTRAVENOUS | Status: AC | PRN
  Administered 2019-01-12: 500 [IU]
  Filled 2019-01-12: qty 5

## 2019-01-12 NOTE — Patient Instructions (Signed)
[facility] Discharge Instructions for Patients Receiving Chemotherapy  Today you received the following chemotherapy agents Adriamycin and Cytoxan  To help prevent nausea and vomiting after your treatment, we encourage you to take your nausea medication as directed.  If you develop nausea and vomiting that is not controlled by your nausea medication, call the clinic.   BELOW ARE SYMPTOMS THAT SHOULD BE REPORTED IMMEDIATELY:  *FEVER GREATER THAN 100.5 F  *CHILLS WITH OR WITHOUT FEVER  NAUSEA AND VOMITING THAT IS NOT CONTROLLED WITH YOUR NAUSEA MEDICATION  *UNUSUAL SHORTNESS OF BREATH  *UNUSUAL BRUISING OR BLEEDING  TENDERNESS IN MOUTH AND THROAT WITH OR WITHOUT PRESENCE OF ULCERS  *URINARY PROBLEMS  *BOWEL PROBLEMS  UNUSUAL RASH Items with * indicate a potential emergency and should be followed up as soon as possible.  Feel free to call the clinic should you have any questions or concerns. The clinic phone number is [phone].  Please show the CHEMO ALERT CARD at check-in to the Emergency Department and triage nurse.   

## 2019-01-12 NOTE — Progress Notes (Signed)
Received co-pay assistance program letter from [employer] for pt regarding udenyca injection.  Copy of letter given to [facility] financial advocate for patients.

## 2019-01-12 NOTE — Telephone Encounter (Signed)
Spoke to pt after [facility] tx today. Denies questions or needs at this time. Relate doing well. Encourage pt to call with concerns. Received verbal understanding.

## 2019-01-14 ENCOUNTER — Other Ambulatory Visit: Payer: Self-pay

## 2019-01-14 ENCOUNTER — Inpatient Hospital Stay: Payer: BC Managed Care – PPO

## 2019-01-14 VITALS — BP 163/74 | HR 66 | Temp 98.7°F | Resp 12

## 2019-01-14 DIAGNOSIS — Z170 Estrogen receptor positive status [ER+]: Secondary | ICD-10-CM

## 2019-01-14 DIAGNOSIS — C50412 Malignant neoplasm of upper-outer quadrant of left female breast: Secondary | ICD-10-CM

## 2019-01-14 MED ORDER — PEGFILGRASTIM-CBQV 6 MG/0.6ML ~~LOC~~ SOSY
6.0000 mg | PREFILLED_SYRINGE | Freq: Once | SUBCUTANEOUS | Status: AC
  Administered 2019-01-14: 11:00:00 6 mg via SUBCUTANEOUS

## 2019-01-18 NOTE — Assessment & Plan Note (Signed)
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 Adj chemo with DD Adriamycin and Cytoxan followed by Taxol weekly X 12 2. Adj XRT 3. Adj Anti estrogen therapy --------------------------------------------------------------------------------------------- Current treatment: Cycle4day1dose dense Adriamycin and Cytoxan Chemo toxicities:Mild nausea but otherwise tolerated extremely well. Labs reviewed I instructed her to drink more water and eat more protein in the diet.  Return to clinic in2weeksfor cycle 1 Taxol

## 2019-01-21 ENCOUNTER — Other Ambulatory Visit: Payer: Self-pay | Admitting: Hematology and Oncology

## 2019-01-21 DIAGNOSIS — C50412 Malignant neoplasm of upper-outer quadrant of left female breast: Secondary | ICD-10-CM

## 2019-01-23 ENCOUNTER — Other Ambulatory Visit: Payer: Self-pay | Admitting: Hematology and Oncology

## 2019-01-23 DIAGNOSIS — C50412 Malignant neoplasm of upper-outer quadrant of left female breast: Secondary | ICD-10-CM

## 2019-01-23 DIAGNOSIS — Z170 Estrogen receptor positive status [ER+]: Secondary | ICD-10-CM

## 2019-01-23 MED ORDER — LORAZEPAM 0.5 MG PO TABS: ORAL_TABLET | ORAL | 1 refills | Status: DC

## 2019-01-25 NOTE — Progress Notes (Signed)
Patient Care Team: Carollee Herter, Alferd Apa, DO as PCP - General Dorna Leitz, MD as Consulting Physician (Orthopedic Surgery) Erroll Luna, MD as Consulting Physician (General Surgery) Nicholas Lose, MD as Consulting Physician (Hematology and Oncology) Kyung Rudd, MD as Consulting Physician (Radiation Oncology)  DIAGNOSIS:    ICD-10-CM   1. Malignant neoplasm of upper-outer quadrant of left breast in female, estrogen receptor positive ([place])  C50.412    Z17.0     SUMMARY OF ONCOLOGIC HISTORY: Oncology History  Malignant neoplasm of upper-outer quadrant of left breast in female, estrogen receptor positive ([place])  08/24/2018 Initial Biopsy   Screening detected left breast mass at 2:30 position 6 mm in size with one abnormal lymph node and stable benign calcifications, biopsy of the mass grade 2 IDC with DCIS and lymphovascular invasion, lymph node biopsy is positive, ER 95%, PR 40%, Ki-67 20%, HER-2 -1+ by IHC, T1b N1 stage Ib   08/31/2018 Cancer Staging   Staging form: Breast, AJCC 8th Edition - Clinical: Stage IB (cT1b, cN1(f), cM0, G2, ER+, PR+, HER2-) - Signed by Nicholas Lose, MD on 08/31/2018   10/11/2018 Surgery   Left lumpectomy: Grade 2 IDC with DCIS, 1 cm, margins negative, DCIS focally 0.1 cm from superior margin, 2/2 lymph nodes positive, ER 95%, PR 45%, HER-2 negative, Ki-67 20% AXLND: 3/20 LN Pos (total 5/22)   10/18/2018 Cancer Staging   Staging form: Breast, AJCC 8th Edition - Pathologic stage from 10/18/2018: Stage IB (pT1b, pN2a, cM0, G2, ER+, PR+, HER2-) - Signed by Nicholas Lose, MD on 11/14/2018   12/15/2018 -  Chemotherapy   The patient had DOXOrubicin (ADRIAMYCIN) chemo injection 142 mg, 60 mg/m2 = 142 mg, Intravenous,  Once, 3 of 4 cycles Administration: 142 mg (12/15/2018), 142 mg (12/29/2018), 142 mg (01/12/2019) palonosetron (ALOXI) injection 0.25 mg, 0.25 mg, Intravenous,  Once, 3 of 4 cycles Administration: 0.25 mg (12/15/2018), 0.25 mg (12/29/2018), 0.25 mg  (01/12/2019) pegfilgrastim-cbqv (UDENYCA) injection 6 mg, 6 mg, Subcutaneous, Once, 3 of 4 cycles Administration: 6 mg (12/17/2018), 6 mg (12/31/2018), 6 mg (01/14/2019) cyclophosphamide (CYTOXAN) 1,420 mg in sodium chloride 0.9 % 250 mL chemo infusion, 600 mg/m2 = 1,420 mg, Intravenous,  Once, 3 of 4 cycles Administration: 1,420 mg (12/15/2018), 1,420 mg (12/29/2018), 1,420 mg (01/12/2019) PACLitaxel (TAXOL) 192 mg in sodium chloride 0.9 % 250 mL chemo infusion (</= 77m/m2), 80 mg/m2 = 192 mg, Intravenous,  Once, 0 of 12 cycles fosaprepitant (EMEND) 150 mg, dexamethasone (DECADRON) 12 mg in sodium chloride 0.9 % 145 mL IVPB, , Intravenous,  Once, 3 of 4 cycles Administration:  (12/15/2018),  (12/29/2018),  (01/12/2019)  for chemotherapy treatment.      CHIEF COMPLIANT: Cycle 4 Adriamycin and Cytoxan  INTERVAL HISTORY: PCamauri Fleeceis a 63y.o. with above-mentioned history of left breast cancer who underwent a lumpectomy followed by an axillary lymph node dissection and iscurrently onadjuvant chemotherapy with dose dense Adriamycin and Cytoxan. She presents to the clinic todayfor cycle 4.   REVIEW OF SYSTEMS:   Constitutional: Denies fevers, chills or abnormal weight loss Eyes: Denies blurriness of vision Ears, nose, mouth, throat, and face: Denies mucositis or sore throat Respiratory: Denies cough, dyspnea or wheezes Cardiovascular: Denies palpitation, chest discomfort Gastrointestinal: Denies nausea, heartburn or change in bowel habits Skin: Denies abnormal skin rashes Lymphatics: Denies new lymphadenopathy or easy bruising Neurological: Denies numbness, tingling or new weaknesses Behavioral/Psych: Mood is stable, no new changes  Extremities: No lower extremity edema Breast: denies any pain or lumps or nodules in  either breasts All other systems were reviewed with the patient and are negative.  I have reviewed the past medical history, past surgical history, social history and family  history with the patient and they are unchanged from previous note.  ALLERGIES:  is allergic to adhesive [tape]; erythromycin; lisinopril; and penicillins.  MEDICATIONS:  Current Outpatient Medications  Medication Sig Dispense Refill  . amLODipine (NORVASC) 5 MG tablet Take 1 tablet (5 mg total) by mouth daily. 90 tablet 1  . carvedilol (COREG) 25 MG tablet TAKE 1 TABLET(25 MG) BY MOUTH TWICE DAILY WITH A MEAL 180 tablet 1  . dexamethasone (DECADRON) 4 MG tablet TAKE 1 TABLET BY MOUTH DAY AFTER CHEMO AND 1 TABLET 2 DAYS AFTER CHEMO WITH FOOD 8 tablet 0  . EPINEPHRINE 0.3 mg/0.3 mL IJ SOAJ injection INJECT 0.3 ML(1 SYRINGE) IN THE MUSCLE AS NEEDED FOR ALLERGIC REACTION 2 Device 0  . furosemide (LASIX) 20 MG tablet TAKE 1 TABLET(20 MG) BY MOUTH DAILY 90 tablet 1  . HYDROcodone-acetaminophen (NORCO/VICODIN) 5-325 MG tablet Take 1 tablet by mouth every 6 (six) hours as needed for moderate pain. 12 tablet 0  . lidocaine-prilocaine (EMLA) cream Apply to affected area once 30 g 3  . LORazepam (ATIVAN) 0.5 MG tablet TAKE 1 TABLET(0.5 MG) BY MOUTH AT BEDTIME AS NEEDED FOR SLEEP 30 tablet 1  . meloxicam (MOBIC) 15 MG tablet TAKE 1 TABLET(15 MG) BY MOUTH DAILY AS NEEDED FOR PAIN 90 tablet 3  . ondansetron (ZOFRAN) 8 MG tablet Take 1 tablet (8 mg total) by mouth 2 (two) times daily as needed. Start on the third day after chemotherapy. 30 tablet 1  . prochlorperazine (COMPAZINE) 10 MG tablet TAKE 1 TABLET(10 MG) BY MOUTH EVERY 6 HOURS AS NEEDED FOR NAUSEA OR VOMITING 30 tablet 1  . SYNTHROID 100 MCG tablet 1 po qd 90 tablet 3  . valACYclovir (VALTREX) 1000 MG tablet TAKE 1 TABLET BY MOUTH THREE TIMES DAILY, DISCONTINUE ALL PREVIOUS REFILLS FOR THIS MEDICATION 30 tablet 5   No current facility-administered medications for this visit.     PHYSICAL EXAMINATION: ECOG PERFORMANCE STATUS: 1 - Symptomatic but completely ambulatory  Vitals:   01/26/19 0911  BP: (!) 150/65  Pulse: 88  Resp: 18  Temp: 98.5  F (36.9 C)  SpO2: 100%   Filed Weights   01/26/19 0911  Weight: 270 lb 6.4 oz (122.7 kg)    GENERAL: alert, no distress and comfortable SKIN: skin color, texture, turgor are normal, no rashes or significant lesions EYES: normal, Conjunctiva are pink and non-injected, sclera clear OROPHARYNX: no exudate, no erythema and lips, buccal mucosa, and tongue normal  NECK: supple, thyroid normal size, non-tender, without nodularity LYMPH: no palpable lymphadenopathy in the cervical, axillary or inguinal LUNGS: clear to auscultation and percussion with normal breathing effort HEART: regular rate & rhythm and no murmurs and no lower extremity edema ABDOMEN: abdomen soft, non-tender and normal bowel sounds MUSCULOSKELETAL: no cyanosis of digits and no clubbing  NEURO: alert & oriented x 3 with fluent speech, no focal motor/sensory deficits EXTREMITIES: No lower extremity edema   LABORATORY DATA:  I have reviewed the data as listed CMP Latest Ref Rng & Units 01/12/2019 12/29/2018 12/22/2018  Glucose 70 - 99 mg/dL 101(H) 135(H) 119(H)  BUN 6 - 20 mg/dL _0 Creatinine 0.44 - 1.00 mg/dL 0.68 0.80 0.72  Sodium 135 - 145 mmol/L 141 142 140  Potassium 3.5 - 5.1 mmol/L 4.0 3.9 4.1  Chloride 98 - 111  mmol/L 108 106 104  CO2 22 - 32 mmol/L _0 Calcium 8.9 - 10.3 mg/dL 8.4(L) 8.9 8.9  Total Protein 6.5 - 8.1 g/dL 6.3(L) 7.0 6.8  Total Bilirubin 0.3 - 1.2 mg/dL 0.3 0.3 0.6  Alkaline Phos 38 - 126 U/L 90 98 105  AST 15 - 41 U/L 13(L) 13(L) 10(L)  ALT 0 - 44 U/L _1 Lab Results  Component Value Date   WBC 13.5 (H) 01/12/2019   HGB 11.7 (L) 01/12/2019   HCT 36.9 01/12/2019   MCV 88.5 01/12/2019   PLT 163 01/12/2019   NEUTROABS 10.1 (H) 01/12/2019    ASSESSMENT & PLAN:  Malignant neoplasm of upper-outer quadrant of left breast in female, estrogen receptor positive ([facility]) Left lumpectomy: Grade 2 IDC with DCIS, 1 cm, margins negative, DCIS focally 0.1 cm from superior margin,  2/2 lymph nodes positive, ER 95%, PR 45%, HER-2 negative, Ki-67 20% AXLND: 3/20 LN Pos (total 5/22)  Treatment Plan: 1. Adj chemo with DD Adriamycin and Cytoxan followed by Taxol weekly X 12 2. Adj XRT 3. Adj Anti estrogen therapy --------------------------------------------------------------------------------------------- Current treatment: Cycle4day1dose dense Adriamycin and Cytoxan Chemo toxicities:Mild nausea but otherwise tolerated extremely well. Labs reviewed I instructed her to drink more water and eat more protein in the diet.  Return to clinic in2weeksfor cycle 1 Taxol    No orders of the defined types were placed in this encounter.  The patient has a good understanding of the overall plan. she agrees with it. she will call with any problems that may develop before the next visit here.  Rulon Eisenmenger, MD 01/26/2019  Julious Oka Dorshimer am acting as scribe for Dr. Nicholas Lose.  I have reviewed the above documentation for accuracy and completeness, and I agree with the above.

## 2019-01-26 ENCOUNTER — Other Ambulatory Visit: Payer: Self-pay

## 2019-01-26 ENCOUNTER — Inpatient Hospital Stay: Payer: BC Managed Care – PPO

## 2019-01-26 ENCOUNTER — Inpatient Hospital Stay: Payer: BC Managed Care – PPO | Attending: Hematology and Oncology

## 2019-01-26 ENCOUNTER — Inpatient Hospital Stay (HOSPITAL_BASED_OUTPATIENT_CLINIC_OR_DEPARTMENT_OTHER): Payer: BC Managed Care – PPO | Admitting: Hematology and Oncology

## 2019-01-26 DIAGNOSIS — Z79899 Other long term (current) drug therapy: Secondary | ICD-10-CM | POA: Insufficient documentation

## 2019-01-26 DIAGNOSIS — C50412 Malignant neoplasm of upper-outer quadrant of left female breast: Secondary | ICD-10-CM | POA: Diagnosis not present

## 2019-01-26 DIAGNOSIS — Z7689 Persons encountering health services in other specified circumstances: Secondary | ICD-10-CM

## 2019-01-26 DIAGNOSIS — Z5111 Encounter for antineoplastic chemotherapy: Secondary | ICD-10-CM | POA: Diagnosis not present

## 2019-01-26 DIAGNOSIS — Z170 Estrogen receptor positive status [ER+]: Secondary | ICD-10-CM

## 2019-01-26 DIAGNOSIS — Z95828 Presence of other vascular implants and grafts: Secondary | ICD-10-CM

## 2019-01-26 LAB — CBC WITH DIFFERENTIAL (CANCER CENTER ONLY)
Abs Immature Granulocytes: 1.09 10*3/uL — ABNORMAL HIGH (ref 0.00–0.07)
Basophils Absolute: 0.1 10*3/uL (ref 0.0–0.1)
Basophils Relative: 1 %
Eosinophils Absolute: 0.1 10*3/uL (ref 0.0–0.5)
Eosinophils Relative: 0 %
HCT: 34.5 % — ABNORMAL LOW (ref 36.0–46.0)
Hemoglobin: 11 g/dL — ABNORMAL LOW (ref 12.0–15.0)
Immature Granulocytes: 8 %
Lymphocytes Relative: 9 %
Lymphs Abs: 1.3 10*3/uL (ref 0.7–4.0)
MCH: 28 pg (ref 26.0–34.0)
MCHC: 31.9 g/dL (ref 30.0–36.0)
MCV: 87.8 fL (ref 80.0–100.0)
Monocytes Absolute: 1.1 10*3/uL — ABNORMAL HIGH (ref 0.1–1.0)
Monocytes Relative: 8 %
Neutro Abs: 10.8 10*3/uL — ABNORMAL HIGH (ref 1.7–7.7)
Neutrophils Relative %: 74 %
Platelet Count: 185 10*3/uL (ref 150–400)
RBC: 3.93 MIL/uL (ref 3.87–5.11)
RDW: 16.5 % — ABNORMAL HIGH (ref 11.5–15.5)
WBC Count: 14.4 10*3/uL — ABNORMAL HIGH (ref 4.0–10.5)
nRBC: 0.1 % (ref 0.0–0.2)

## 2019-01-26 LAB — CMP (CANCER CENTER ONLY)
ALT: 16 U/L (ref 0–44)
AST: 14 U/L — ABNORMAL LOW (ref 15–41)
Albumin: 3.2 g/dL — ABNORMAL LOW (ref 3.5–5.0)
Alkaline Phosphatase: 110 U/L (ref 38–126)
Anion gap: 9 (ref 5–15)
BUN: 14 mg/dL (ref 6–20)
CO2: 25 mmol/L (ref 22–32)
Calcium: 8.6 mg/dL — ABNORMAL LOW (ref 8.9–10.3)
Chloride: 108 mmol/L (ref 98–111)
Creatinine: 0.75 mg/dL (ref 0.44–1.00)
GFR, Est AFR Am: >60 mL/min (ref >60)
GFR, Estimated: >60 mL/min (ref >60)
Glucose, Bld: 133 mg/dL — ABNORMAL HIGH (ref 70–99)
Potassium: 3.9 mmol/L (ref 3.5–5.1)
Sodium: 142 mmol/L (ref 135–145)
Total Bilirubin: 0.3 mg/dL (ref 0.3–1.2)
Total Protein: 6.5 g/dL (ref 6.5–8.1)

## 2019-01-26 MED ORDER — SODIUM CHLORIDE 0.9 % IV SOLN
Freq: Once | INTRAVENOUS | Status: AC
  Administered 2019-01-26: 11:00:00 via INTRAVENOUS
  Filled 2019-01-26: qty 5

## 2019-01-26 MED ORDER — SODIUM CHLORIDE 0.9 % IV SOLN
Freq: Once | INTRAVENOUS | Status: AC
  Administered 2019-01-26: 10:00:00 via INTRAVENOUS
  Filled 2019-01-26: qty 250

## 2019-01-26 MED ORDER — PALONOSETRON HCL INJECTION 0.25 MG/5ML
INTRAVENOUS | Status: AC
  Filled 2019-01-26: qty 5

## 2019-01-26 MED ORDER — HEPARIN SOD (PORK) LOCK FLUSH 100 UNIT/ML IV SOLN
500.0000 [IU] | Freq: Once | INTRAVENOUS | Status: AC | PRN
  Administered 2019-01-26: 500 [IU]
  Filled 2019-01-26: qty 5

## 2019-01-26 MED ORDER — SODIUM CHLORIDE 0.9 % IV SOLN
600.0000 mg/m2 | Freq: Once | INTRAVENOUS | Status: AC
  Administered 2019-01-26: 1420 mg via INTRAVENOUS
  Filled 2019-01-26: qty 71

## 2019-01-26 MED ORDER — PALONOSETRON HCL INJECTION 0.25 MG/5ML
0.2500 mg | Freq: Once | INTRAVENOUS | Status: AC
  Administered 2019-01-26: 0.25 mg via INTRAVENOUS

## 2019-01-26 MED ORDER — SODIUM CHLORIDE 0.9% FLUSH
10.0000 mL | INTRAVENOUS | Status: DC | PRN
  Administered 2019-01-26: 10 mL
  Filled 2019-01-26: qty 10

## 2019-01-26 MED ORDER — DOXORUBICIN HCL CHEMO IV INJECTION 2 MG/ML
60.0000 mg/m2 | Freq: Once | INTRAVENOUS | Status: AC
  Administered 2019-01-26: 12:00:00 142 mg via INTRAVENOUS
  Filled 2019-01-26: qty 71

## 2019-01-27 ENCOUNTER — Encounter: Payer: Self-pay | Admitting: *Deleted

## 2019-01-28 ENCOUNTER — Other Ambulatory Visit: Payer: Self-pay

## 2019-01-28 ENCOUNTER — Inpatient Hospital Stay: Payer: BC Managed Care – PPO

## 2019-01-28 VITALS — BP 137/61 | HR 75 | Temp 97.7°F | Resp 18

## 2019-01-28 DIAGNOSIS — C50412 Malignant neoplasm of upper-outer quadrant of left female breast: Secondary | ICD-10-CM | POA: Diagnosis not present

## 2019-01-28 MED ORDER — PEGFILGRASTIM-CBQV 6 MG/0.6ML ~~LOC~~ SOSY
6.0000 mg | PREFILLED_SYRINGE | Freq: Once | SUBCUTANEOUS | Status: AC
  Administered 2019-01-28: 6 mg via SUBCUTANEOUS

## 2019-01-28 MED ORDER — PEGFILGRASTIM-CBQV 6 MG/0.6ML ~~LOC~~ SOSY
PREFILLED_SYRINGE | SUBCUTANEOUS | Status: AC
  Filled 2019-01-28: qty 0.6

## 2019-01-28 NOTE — Patient Instructions (Signed)
Pegfilgrastim injection  What is this medicine?  PEGFILGRASTIM (PEG fil gra stim) is a long-acting granulocyte colony-stimulating factor that stimulates the growth of neutrophils, a type of white blood cell important in the body's fight against infection. It is used to reduce the incidence of fever and infection in patients with certain types of cancer who are receiving chemotherapy that affects the bone marrow, and to increase survival after being exposed to high doses of radiation.  This medicine may be used for other purposes; ask your health care provider or pharmacist if you have questions.  COMMON BRAND NAME(S): Fulphila, Neulasta, UDENYCA  What should I tell my health care provider before I take this medicine?  They need to know if you have any of these conditions:  -kidney disease  -latex allergy  -ongoing radiation therapy  -sickle cell disease  -skin reactions to acrylic adhesives (On-Body Injector only)  -an unusual or allergic reaction to pegfilgrastim, filgrastim, other medicines, foods, dyes, or preservatives  -pregnant or trying to get pregnant  -breast-feeding  How should I use this medicine?  This medicine is for injection under the skin. If you get this medicine at home, you will be taught how to prepare and give the pre-filled syringe or how to use the On-body Injector. Refer to the patient Instructions for Use for detailed instructions. Use exactly as directed. Tell your healthcare provider immediately if you suspect that the On-body Injector may not have performed as intended or if you suspect the use of the On-body Injector resulted in a missed or partial dose.  It is important that you put your used needles and syringes in a special sharps container. Do not put them in a trash can. If you do not have a sharps container, call your pharmacist or healthcare provider to get one.  Talk to your pediatrician regarding the use of this medicine in children. While this drug may be prescribed for  selected conditions, precautions do apply.  Overdosage: If you think you have taken too much of this medicine contact a poison control center or emergency room at once.  NOTE: This medicine is only for you. Do not share this medicine with others.  What if I miss a dose?  It is important not to miss your dose. Call your doctor or health care professional if you miss your dose. If you miss a dose due to an On-body Injector failure or leakage, a new dose should be administered as soon as possible using a single prefilled syringe for manual use.  What may interact with this medicine?  Interactions have not been studied.  Give your health care provider a list of all the medicines, herbs, non-prescription drugs, or dietary supplements you use. Also tell them if you smoke, drink alcohol, or use illegal drugs. Some items may interact with your medicine.  This list may not describe all possible interactions. Give your health care provider a list of all the medicines, herbs, non-prescription drugs, or dietary supplements you use. Also tell them if you smoke, drink alcohol, or use illegal drugs. Some items may interact with your medicine.  What should I watch for while using this medicine?  You may need blood work done while you are taking this medicine.  If you are going to need a MRI, CT scan, or other procedure, tell your doctor that you are using this medicine (On-Body Injector only).  What side effects may I notice from receiving this medicine?  Side effects that you should report to   your doctor or health care professional as soon as possible:  -allergic reactions like skin rash, itching or hives, swelling of the face, lips, or tongue  -back pain  -dizziness  -fever  -pain, redness, or irritation at site where injected  -pinpoint red spots on the skin  -red or dark-brown urine  -shortness of breath or breathing problems  -stomach or side pain, or pain at the shoulder  -swelling  -tiredness  -trouble passing urine or  change in the amount of urine  Side effects that usually do not require medical attention (report to your doctor or health care professional if they continue or are bothersome):  -bone pain  -muscle pain  This list may not describe all possible side effects. Call your doctor for medical advice about side effects. You may report side effects to FDA at 1-800-FDA-1088.  Where should I keep my medicine?  Keep out of the reach of children.  If you are using this medicine at home, you will be instructed on how to store it. Throw away any unused medicine after the expiration date on the label.  NOTE: This sheet is a summary. It may not cover all possible information. If you have questions about this medicine, talk to your doctor, pharmacist, or health care provider.   2019 Elsevier/Gold Standard (2017-11-08 16:57:08)

## 2019-02-02 NOTE — Assessment & Plan Note (Signed)
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 Adj chemo with DD Adriamycin and Cytoxan followed by Taxol weekly X 12 2. Adj XRT 3. Adj Anti estrogen therapy --------------------------------------------------------------------------------------------- Current treatment: Completed 4 cycles of dose dense Adriamycin and Cytoxan, today cycle 1 Taxol Chemo toxicities:Mild nausea but otherwise tolerated extremely well. Labs reviewed I instructed her to drink more water and eat more protein in the diet.  Return to clinic in1 week for toxicity check and for cycle 2 of Taxol

## 2019-02-08 NOTE — Progress Notes (Signed)
Patient Care Team: Carollee Herter, Alferd Apa, DO as PCP - General Dorna Leitz, MD as Consulting Physician (Orthopedic Surgery) Erroll Luna, MD as Consulting Physician (General Surgery) Nicholas Lose, MD as Consulting Physician (Hematology and Oncology) Kyung Rudd, MD as Consulting Physician (Radiation Oncology) Rockwell Germany, RN as Oncology Nurse Navigator Mauro Kaufmann, RN as Oncology Nurse Navigator  DIAGNOSIS:    ICD-10-CM   1. Malignant neoplasm of upper-outer quadrant of left breast in female, estrogen receptor positive ([place])  C50.412    Z17.0     SUMMARY OF ONCOLOGIC HISTORY: Oncology History  Malignant neoplasm of upper-outer quadrant of left breast in female, estrogen receptor positive ([place])  08/24/2018 Initial Biopsy   Screening detected left breast mass at 2:30 position 6 mm in size with one abnormal lymph node and stable benign calcifications, biopsy of the mass grade 2 IDC with DCIS and lymphovascular invasion, lymph node biopsy is positive, ER 95%, PR 40%, Ki-67 20%, HER-2 -1+ by IHC, T1b N1 stage Ib   08/31/2018 Cancer Staging   Staging form: Breast, AJCC 8th Edition - Clinical: Stage IB (cT1b, cN1(f), cM0, G2, ER+, PR+, HER2-) - Signed by Nicholas Lose, MD on 08/31/2018   10/11/2018 Surgery   Left lumpectomy: Grade 2 IDC with DCIS, 1 cm, margins negative, DCIS focally 0.1 cm from superior margin, 2/2 lymph nodes positive, ER 95%, PR 45%, HER-2 negative, Ki-67 20% AXLND: 3/20 LN Pos (total 5/22)   10/18/2018 Cancer Staging   Staging form: Breast, AJCC 8th Edition - Pathologic stage from 10/18/2018: Stage IB (pT1b, pN2a, cM0, G2, ER+, PR+, HER2-) - Signed by Nicholas Lose, MD on 11/14/2018   12/15/2018 -  Chemotherapy   The patient had DOXOrubicin (ADRIAMYCIN) chemo injection 142 mg, 60 mg/m2 = 142 mg, Intravenous,  Once, 4 of 4 cycles Administration: 142 mg (12/15/2018), 142 mg (12/29/2018), 142 mg (01/12/2019), 142 mg (01/26/2019) palonosetron (ALOXI) injection 0.25 mg,  0.25 mg, Intravenous,  Once, 4 of 4 cycles Administration: 0.25 mg (12/15/2018), 0.25 mg (12/29/2018), 0.25 mg (01/12/2019), 0.25 mg (01/26/2019) pegfilgrastim-cbqv (UDENYCA) injection 6 mg, 6 mg, Subcutaneous, Once, 4 of 4 cycles Administration: 6 mg (12/17/2018), 6 mg (12/31/2018), 6 mg (01/14/2019), 6 mg (01/28/2019) cyclophosphamide (CYTOXAN) 1,420 mg in sodium chloride 0.9 % 250 mL chemo infusion, 600 mg/m2 = 1,420 mg, Intravenous,  Once, 4 of 4 cycles Administration: 1,420 mg (12/15/2018), 1,420 mg (12/29/2018), 1,420 mg (01/12/2019), 1,420 mg (01/26/2019) PACLitaxel (TAXOL) 192 mg in sodium chloride 0.9 % 250 mL chemo infusion (</= 39m/m2), 80 mg/m2 = 192 mg, Intravenous,  Once, 0 of 12 cycles fosaprepitant (EMEND) 150 mg, dexamethasone (DECADRON) 12 mg in sodium chloride 0.9 % 145 mL IVPB, , Intravenous,  Once, 4 of 4 cycles Administration:  (12/15/2018),  (12/29/2018),  (01/12/2019),  (01/26/2019)  for chemotherapy treatment.      CHIEF COMPLIANT: Cycle 1 Taxol  INTERVAL HISTORY: PDorothey Oetkenis a 63y.o. with above-mentioned history of left breast cancer who underwent a lumpectomy followed by an axillary lymph node dissection and iscurrently onadjuvant chemotherapy. She completed 4 cycles of dose dense Adriamycin and Cytoxan. She presents to the clinic todayto begin weekly Taxol treatments.   REVIEW OF SYSTEMS:   Constitutional: Denies fevers, chills or abnormal weight loss Eyes: Denies blurriness of vision Ears, nose, mouth, throat, and face: Denies mucositis or sore throat Respiratory: Denies cough, dyspnea or wheezes Cardiovascular: Denies palpitation, chest discomfort Gastrointestinal: Denies nausea, heartburn or change in bowel habits Skin: Denies abnormal skin rashes Lymphatics: Denies  new lymphadenopathy or easy bruising Neurological: Denies numbness, tingling or new weaknesses Behavioral/Psych: Mood is stable, no new changes  Extremities: No lower extremity edema Breast:  denies  any pain or lumps or nodules in either breasts All other systems were reviewed with the patient and are negative.  I have reviewed the past medical history, past surgical history, social history and family history with the patient and they are unchanged from previous note.  ALLERGIES:  is allergic to adhesive [tape]; erythromycin; lisinopril; and penicillins.  MEDICATIONS:  Current Outpatient Medications  Medication Sig Dispense Refill  . amLODipine (NORVASC) 5 MG tablet Take 1 tablet (5 mg total) by mouth daily. 90 tablet 1  . carvedilol (COREG) 25 MG tablet TAKE 1 TABLET(25 MG) BY MOUTH TWICE DAILY WITH A MEAL 180 tablet 1  . EPINEPHRINE 0.3 mg/0.3 mL IJ SOAJ injection INJECT 0.3 ML(1 SYRINGE) IN THE MUSCLE AS NEEDED FOR ALLERGIC REACTION 2 Device 0  . furosemide (LASIX) 20 MG tablet TAKE 1 TABLET(20 MG) BY MOUTH DAILY 90 tablet 1  . lidocaine-prilocaine (EMLA) cream Apply to affected area once 30 g 3  . LORazepam (ATIVAN) 0.5 MG tablet TAKE 1 TABLET(0.5 MG) BY MOUTH AT BEDTIME AS NEEDED FOR SLEEP 30 tablet 1  . meloxicam (MOBIC) 15 MG tablet TAKE 1 TABLET(15 MG) BY MOUTH DAILY AS NEEDED FOR PAIN 90 tablet 3  . ondansetron (ZOFRAN) 8 MG tablet Take 1 tablet (8 mg total) by mouth 2 (two) times daily as needed. Start on the third day after chemotherapy. 30 tablet 1  . prochlorperazine (COMPAZINE) 10 MG tablet TAKE 1 TABLET(10 MG) BY MOUTH EVERY 6 HOURS AS NEEDED FOR NAUSEA OR VOMITING 30 tablet 1  . SYNTHROID 100 MCG tablet 1 po qd 90 tablet 3  . valACYclovir (VALTREX) 1000 MG tablet TAKE 1 TABLET BY MOUTH THREE TIMES DAILY, DISCONTINUE ALL PREVIOUS REFILLS FOR THIS MEDICATION 30 tablet 5   No current facility-administered medications for this visit.     PHYSICAL EXAMINATION: ECOG PERFORMANCE STATUS: 1 - Symptomatic but completely ambulatory  Vitals:   02/09/19 0936  BP: (!) 163/68  Pulse: 93  Resp: 18  Temp: (!) 97.5 F (36.4 C)  SpO2: 100%   Filed Weights   02/09/19 0936   Weight: 269 lb 1.6 oz (122.1 kg)    GENERAL: alert, no distress and comfortable SKIN: skin color, texture, turgor are normal, no rashes or significant lesions EYES: normal, Conjunctiva are pink and non-injected, sclera clear OROPHARYNX: no exudate, no erythema and lips, buccal mucosa, and tongue normal  NECK: supple, thyroid normal size, non-tender, without nodularity LYMPH: no palpable lymphadenopathy in the cervical, axillary or inguinal LUNGS: clear to auscultation and percussion with normal breathing effort HEART: regular rate & rhythm and no murmurs and no lower extremity edema ABDOMEN: abdomen soft, non-tender and normal bowel sounds MUSCULOSKELETAL: no cyanosis of digits and no clubbing  NEURO: alert & oriented x 3 with fluent speech, no focal motor/sensory deficits EXTREMITIES: No lower extremity edema  LABORATORY DATA:  I have reviewed the data as listed CMP Latest Ref Rng & Units 01/26/2019 01/12/2019 12/29/2018  Glucose 70 - 99 mg/dL 133(H) 101(H) 135(H)  BUN 6 - 20 mg/dL 14 15 11   Creatinine 0.44 - 1.00 mg/dL 0.75 0.68 0.80  Sodium 135 - 145 mmol/L 142 141 142  Potassium 3.5 - 5.1 mmol/L 3.9 4.0 3.9  Chloride 98 - 111 mmol/L 108 108 106  CO2 22 - 32 mmol/L 25 27 26   Calcium 8.9 -  10.3 mg/dL 8.6(L) 8.4(L) 8.9  Total Protein 6.5 - 8.1 g/dL 6.5 6.3(L) 7.0  Total Bilirubin 0.3 - 1.2 mg/dL 0.3 0.3 0.3  Alkaline Phos 38 - 126 U/L 110 90 98  AST 15 - 41 U/L 14(L) 13(L) 13(L)  ALT 0 - 44 U/L 16 16 13     Lab Results  Component Value Date   WBC 11.8 (H) 02/09/2019   HGB 10.9 (L) 02/09/2019   HCT 34.1 (L) 02/09/2019   MCV 89.0 02/09/2019   PLT 153 02/09/2019   NEUTROABS PENDING 02/09/2019    ASSESSMENT & PLAN:  Malignant neoplasm of upper-outer quadrant of left breast in female, estrogen receptor positive ([facility]) Left lumpectomy: Grade 2 IDC with DCIS, 1 cm, margins negative, DCIS focally 0.1 cm from superior margin, 2/2 lymph nodes positive, ER 95%, PR 45%, HER-2 negative,  Ki-67 20% AXLND: 3/20 LN Pos (total 5/22)  Treatment Plan: 1. Adj chemo with DD Adriamycin and Cytoxan followed by Taxol weekly X 12 2. Adj XRT 3. Adj Anti estrogen therapy --------------------------------------------------------------------------------------------- Current treatment: Completed 4 cycles of dose dense Adriamycin and Cytoxan, today cycle 1 Taxol Chemo toxicities:Mild nausea but otherwise tolerated extremely well. Labs reviewed I instructed her to drink more water and eat more protein in the diet.  Return to clinic in1 week for toxicity check and for cycle 2 of Taxol  No orders of the defined types were placed in this encounter.  The patient has a good understanding of the overall plan. she agrees with it. she will call with any problems that may develop before the next visit here.  Nicholas Lose, MD 02/09/2019  Julious Oka Dorshimer am acting as scribe for Dr. Nicholas Lose.  I have reviewed the above documentation for accuracy and completeness, and I agree with the above.

## 2019-02-09 ENCOUNTER — Other Ambulatory Visit: Payer: Self-pay

## 2019-02-09 ENCOUNTER — Inpatient Hospital Stay: Payer: BC Managed Care – PPO

## 2019-02-09 ENCOUNTER — Inpatient Hospital Stay (HOSPITAL_BASED_OUTPATIENT_CLINIC_OR_DEPARTMENT_OTHER): Payer: BC Managed Care – PPO | Admitting: Hematology and Oncology

## 2019-02-09 ENCOUNTER — Telehealth: Payer: Self-pay | Admitting: *Deleted

## 2019-02-09 VITALS — BP 146/80 | HR 83 | Temp 98.3°F | Resp 18

## 2019-02-09 DIAGNOSIS — Z79899 Other long term (current) drug therapy: Secondary | ICD-10-CM

## 2019-02-09 DIAGNOSIS — C50412 Malignant neoplasm of upper-outer quadrant of left female breast: Secondary | ICD-10-CM | POA: Diagnosis not present

## 2019-02-09 DIAGNOSIS — Z170 Estrogen receptor positive status [ER+]: Secondary | ICD-10-CM

## 2019-02-09 DIAGNOSIS — Z5111 Encounter for antineoplastic chemotherapy: Secondary | ICD-10-CM

## 2019-02-09 DIAGNOSIS — Z95828 Presence of other vascular implants and grafts: Secondary | ICD-10-CM

## 2019-02-09 DIAGNOSIS — Z7689 Persons encountering health services in other specified circumstances: Secondary | ICD-10-CM

## 2019-02-09 LAB — CMP (CANCER CENTER ONLY)
ALT: 17 U/L (ref 0–44)
AST: 15 U/L (ref 15–41)
Albumin: 3.3 g/dL — ABNORMAL LOW (ref 3.5–5.0)
Alkaline Phosphatase: 101 U/L (ref 38–126)
Anion gap: 9 (ref 5–15)
BUN: 9 mg/dL (ref 8–23)
CO2: 26 mmol/L (ref 22–32)
Calcium: 8.4 mg/dL — ABNORMAL LOW (ref 8.9–10.3)
Chloride: 106 mmol/L (ref 98–111)
Creatinine: 0.75 mg/dL (ref 0.44–1.00)
GFR, Est AFR Am: >60 mL/min (ref >60)
GFR, Estimated: >60 mL/min (ref >60)
Glucose, Bld: 154 mg/dL — ABNORMAL HIGH (ref 70–99)
Potassium: 3.8 mmol/L (ref 3.5–5.1)
Sodium: 141 mmol/L (ref 135–145)
Total Bilirubin: 0.3 mg/dL (ref 0.3–1.2)
Total Protein: 6.3 g/dL — ABNORMAL LOW (ref 6.5–8.1)

## 2019-02-09 LAB — CBC WITH DIFFERENTIAL (CANCER CENTER ONLY)
Abs Immature Granulocytes: 0.72 10*3/uL — ABNORMAL HIGH (ref 0.00–0.07)
Basophils Absolute: 0.1 10*3/uL (ref 0.0–0.1)
Basophils Relative: 1 %
Eosinophils Absolute: 0 10*3/uL (ref 0.0–0.5)
Eosinophils Relative: 0 %
HCT: 34.1 % — ABNORMAL LOW (ref 36.0–46.0)
Hemoglobin: 10.9 g/dL — ABNORMAL LOW (ref 12.0–15.0)
Immature Granulocytes: 6 %
Lymphocytes Relative: 9 %
Lymphs Abs: 1 10*3/uL (ref 0.7–4.0)
MCH: 28.5 pg (ref 26.0–34.0)
MCHC: 32 g/dL (ref 30.0–36.0)
MCV: 89 fL (ref 80.0–100.0)
Monocytes Absolute: 1.1 10*3/uL — ABNORMAL HIGH (ref 0.1–1.0)
Monocytes Relative: 9 %
Neutro Abs: 8.9 10*3/uL — ABNORMAL HIGH (ref 1.7–7.7)
Neutrophils Relative %: 75 %
Platelet Count: 153 10*3/uL (ref 150–400)
RBC: 3.83 MIL/uL — ABNORMAL LOW (ref 3.87–5.11)
RDW: 18 % — ABNORMAL HIGH (ref 11.5–15.5)
WBC Count: 11.8 10*3/uL — ABNORMAL HIGH (ref 4.0–10.5)
nRBC: 0.2 % (ref 0.0–0.2)

## 2019-02-09 MED ORDER — DIPHENHYDRAMINE HCL 50 MG/ML IJ SOLN
50.0000 mg | Freq: Once | INTRAMUSCULAR | Status: AC
  Administered 2019-02-09: 50 mg via INTRAVENOUS

## 2019-02-09 MED ORDER — SODIUM CHLORIDE 0.9% FLUSH
10.0000 mL | INTRAVENOUS | Status: DC | PRN
  Administered 2019-02-09: 10 mL
  Filled 2019-02-09: qty 10

## 2019-02-09 MED ORDER — FAMOTIDINE IN NACL 20-0.9 MG/50ML-% IV SOLN
INTRAVENOUS | Status: AC
  Filled 2019-02-09: qty 50

## 2019-02-09 MED ORDER — SODIUM CHLORIDE 0.9 % IV SOLN
80.0000 mg/m2 | Freq: Once | INTRAVENOUS | Status: AC
  Administered 2019-02-09: 192 mg via INTRAVENOUS
  Filled 2019-02-09: qty 32

## 2019-02-09 MED ORDER — SODIUM CHLORIDE 0.9 % IV SOLN
20.0000 mg | Freq: Once | INTRAVENOUS | Status: AC
  Administered 2019-02-09: 20 mg via INTRAVENOUS
  Filled 2019-02-09: qty 2

## 2019-02-09 MED ORDER — FAMOTIDINE IN NACL 20-0.9 MG/50ML-% IV SOLN
20.0000 mg | Freq: Once | INTRAVENOUS | Status: AC
  Administered 2019-02-09: 20 mg via INTRAVENOUS

## 2019-02-09 MED ORDER — HEPARIN SOD (PORK) LOCK FLUSH 100 UNIT/ML IV SOLN
500.0000 [IU] | Freq: Once | INTRAVENOUS | Status: AC | PRN
  Administered 2019-02-09: 500 [IU]
  Filled 2019-02-09: qty 5

## 2019-02-09 MED ORDER — DIPHENHYDRAMINE HCL 50 MG/ML IJ SOLN
INTRAMUSCULAR | Status: AC
  Filled 2019-02-09: qty 1

## 2019-02-09 MED ORDER — SODIUM CHLORIDE 0.9 % IV SOLN
Freq: Once | INTRAVENOUS | Status: AC
  Administered 2019-02-09: 11:00:00 via INTRAVENOUS
  Filled 2019-02-09: qty 250

## 2019-02-09 NOTE — Assessment & Plan Note (Signed)
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 Adj chemo with DD Adriamycin and Cytoxan followed by Taxol weekly X 12 2. Adj XRT 3. Adj Anti estrogen therapy --------------------------------------------------------------------------------------------- Current treatment: Completed 4 cycles of dose dense Adriamycin and Cytoxan, today is cycle 1 Taxol Chemo toxicities:Mild nausea but otherwise tolerated extremely well. Labs reviewed   Return to clinic in 1 week for toxicity check and for cycle 2 Taxol

## 2019-02-09 NOTE — Telephone Encounter (Signed)
Left vm to assess needs during chemo. Contact information provided for questions or concerns.

## 2019-02-09 NOTE — Patient Instructions (Signed)
[facility] Discharge Instructions for Patients Receiving Chemotherapy  Today you received the following chemotherapy agents: paclitaxel (Taxol).  To help prevent nausea and vomiting after your treatment, we encourage you to take your nausea medication as directed   If you develop nausea and vomiting that is not controlled by your nausea medication, call the clinic.   BELOW ARE SYMPTOMS THAT SHOULD BE REPORTED IMMEDIATELY:  *FEVER GREATER THAN 100.5 F  *CHILLS WITH OR WITHOUT FEVER  NAUSEA AND VOMITING THAT IS NOT CONTROLLED WITH YOUR NAUSEA MEDICATION  *UNUSUAL SHORTNESS OF BREATH  *UNUSUAL BRUISING OR BLEEDING  TENDERNESS IN MOUTH AND THROAT WITH OR WITHOUT PRESENCE OF ULCERS  *URINARY PROBLEMS  *BOWEL PROBLEMS  UNUSUAL RASH Items with * indicate a potential emergency and should be followed up as soon as possible.  Feel free to call the clinic should you have any questions or concerns. The clinic phone number is [phone].  Please show the [place] [facility] at check-in to the Emergency Department and triage nurse.  Paclitaxel injection What is this medicine? PACLITAXEL (PAK li TAX el) is a chemotherapy drug. It targets fast dividing cells, like cancer cells, and causes these cells to die. This medicine is used to treat ovarian cancer, breast cancer, lung cancer, Kaposi's sarcoma, and other cancers. This medicine may be used for other purposes; ask your health care provider or pharmacist if you have questions. COMMON BRAND NAME(S): Onxol, Taxol What should I tell my health care provider before I take this medicine? They need to know if you have any of these conditions: -history of irregular heartbeat -liver disease -low blood counts, like low white cell, platelet, or red cell counts -lung or breathing disease, like asthma -tingling of the fingers or toes, or other nerve disorder -an unusual or allergic reaction to paclitaxel, alcohol, polyoxyethylated  castor oil, other chemotherapy, other medicines, foods, dyes, or preservatives -pregnant or trying to get pregnant -breast-feeding How should I use this medicine? This drug is given as an infusion into a vein. It is administered in a hospital or clinic by a specially trained health care professional. Talk to your pediatrician regarding the use of this medicine in children. Special care may be needed. Overdosage: If you think you have taken too much of this medicine contact a poison control center or emergency room at once. NOTE: This medicine is only for you. Do not share this medicine with others. What if I miss a dose? It is important not to miss your dose. Call your doctor or health care professional if you are unable to keep an appointment. What may interact with this medicine? Do not take this medicine with any of the following medications: -disulfiram -metronidazole This medicine may also interact with the following medications: -antiviral medicines for hepatitis, HIV or AIDS -certain antibiotics like erythromycin and clarithromycin -certain medicines for fungal infections like ketoconazole and itraconazole -certain medicines for seizures like carbamazepine, phenobarbital, phenytoin -gemfibrozil -nefazodone -rifampin -St. John's wort This list may not describe all possible interactions. Give your health care provider a list of all the medicines, herbs, non-prescription drugs, or dietary supplements you use. Also tell them if you smoke, drink alcohol, or use illegal drugs. Some items may interact with your medicine. What should I watch for while using this medicine? Your condition will be monitored carefully while you are receiving this medicine. You will need important blood work done while you are taking this medicine. This medicine can cause serious allergic reactions. To reduce your  risk you will need to take other medicine(s) before treatment with this medicine. If you experience  allergic reactions like skin rash, itching or hives, swelling of the face, lips, or tongue, tell your doctor or health care professional right away. In some cases, you may be given additional medicines to help with side effects. Follow all directions for their use. This drug may make you feel generally unwell. This is not uncommon, as chemotherapy can affect healthy cells as well as cancer cells. Report any side effects. Continue your course of treatment even though you feel ill unless your doctor tells you to stop. Call your doctor or health care professional for advice if you get a fever, chills or sore throat, or other symptoms of a cold or flu. Do not treat yourself. This drug decreases your body's ability to fight infections. Try to avoid being around people who are sick. This medicine may increase your risk to bruise or bleed. Call your doctor or health care professional if you notice any unusual bleeding. Be careful brushing and flossing your teeth or using a toothpick because you may get an infection or bleed more easily. If you have any dental work done, tell your dentist you are receiving this medicine. Avoid taking products that contain aspirin, acetaminophen, ibuprofen, naproxen, or ketoprofen unless instructed by your doctor. These medicines may hide a fever. Do not become pregnant while taking this medicine. Women should inform their doctor if they wish to become pregnant or think they might be pregnant. There is a potential for serious side effects to an unborn child. Talk to your health care professional or pharmacist for more information. Do not breast-feed an infant while taking this medicine. Men are advised not to father a child while receiving this medicine. This product may contain alcohol. Ask your pharmacist or healthcare provider if this medicine contains alcohol. Be sure to tell all healthcare providers you are taking this medicine. Certain medicines, like metronidazole and  disulfiram, can cause an unpleasant reaction when taken with alcohol. The reaction includes flushing, headache, nausea, vomiting, sweating, and increased thirst. The reaction can last from 30 minutes to several hours. What side effects may I notice from receiving this medicine? Side effects that you should report to your doctor or health care professional as soon as possible: -allergic reactions like skin rash, itching or hives, swelling of the face, lips, or tongue -breathing problems -changes in vision -fast, irregular heartbeat -high or low blood pressure -mouth sores -pain, tingling, numbness in the hands or feet -signs of decreased platelets or bleeding - bruising, pinpoint red spots on the skin, black, tarry stools, blood in the urine -signs of decreased red blood cells - unusually weak or tired, feeling faint or lightheaded, falls -signs of infection - fever or chills, cough, sore throat, pain or difficulty passing urine -signs and symptoms of liver injury like dark yellow or brown urine; general ill feeling or flu-like symptoms; light-colored stools; loss of appetite; nausea; right upper belly pain; unusually weak or tired; yellowing of the eyes or skin -swelling of the ankles, feet, hands -unusually slow heartbeat Side effects that usually do not require medical attention (report to your doctor or health care professional if they continue or are bothersome): -diarrhea -hair loss -loss of appetite -muscle or joint pain -nausea, vomiting -pain, redness, or irritation at site where injected -tiredness This list may not describe all possible side effects. Call your doctor for medical advice about side effects. You may report side effects to  FDA at 1-800-FDA-1088. Where should I keep my medicine? This drug is given in a hospital or clinic and will not be stored at home. NOTE: This sheet is a summary. It may not cover all possible information. If you have questions about this medicine,  talk to your doctor, pharmacist, or health care provider.  2019 Elsevier/Gold Standard (2017-04-06 13:14:55)

## 2019-02-13 ENCOUNTER — Telehealth: Payer: Self-pay

## 2019-02-13 NOTE — Telephone Encounter (Signed)
RN placed call for first time follow up after receiving Taxol.   Pt tolerating food and fluids well.  Denies any nausea/vomiting, or diarrhea.  Pt reports small amount of body aches, no fever.  RN encouraged OTC NSAIDS.  Pt voiced understanding and agreement.    All questions answered by RN.  No further needs at this time.

## 2019-02-15 NOTE — Progress Notes (Signed)
Patient Care Team: Carollee Herter, Alferd Apa, DO as PCP - General Dorna Leitz, MD as Consulting Physician (Orthopedic Surgery) Erroll Luna, MD as Consulting Physician (General Surgery) Nicholas Lose, MD as Consulting Physician (Hematology and Oncology) Kyung Rudd, MD as Consulting Physician (Radiation Oncology) Rockwell Germany, RN as Oncology Nurse Navigator Mauro Kaufmann, RN as Oncology Nurse Navigator  DIAGNOSIS:    ICD-10-CM   1. Malignant neoplasm of upper-outer quadrant of left breast in female, estrogen receptor positive ([place])  C50.412    Z17.0     SUMMARY OF ONCOLOGIC HISTORY: Oncology History  Malignant neoplasm of upper-outer quadrant of left breast in female, estrogen receptor positive ([place])  08/24/2018 Initial Biopsy   Screening detected left breast mass at 2:30 position 6 mm in size with one abnormal lymph node and stable benign calcifications, biopsy of the mass grade 2 IDC with DCIS and lymphovascular invasion, lymph node biopsy is positive, ER 95%, PR 40%, Ki-67 20%, HER-2 -1+ by IHC, T1b N1 stage Ib   08/31/2018 Cancer Staging   Staging form: Breast, AJCC 8th Edition - Clinical: Stage IB (cT1b, cN1(f), cM0, G2, ER+, PR+, HER2-) - Signed by Nicholas Lose, MD on 08/31/2018   10/11/2018 Surgery   Left lumpectomy: Grade 2 IDC with DCIS, 1 cm, margins negative, DCIS focally 0.1 cm from superior margin, 2/2 lymph nodes positive, ER 95%, PR 45%, HER-2 negative, Ki-67 20% AXLND: 3/20 LN Pos (total 5/22)   10/18/2018 Cancer Staging   Staging form: Breast, AJCC 8th Edition - Pathologic stage from 10/18/2018: Stage IB (pT1b, pN2a, cM0, G2, ER+, PR+, HER2-) - Signed by Nicholas Lose, MD on 11/14/2018   12/15/2018 -  Chemotherapy   The patient had DOXOrubicin (ADRIAMYCIN) chemo injection 142 mg, 60 mg/m2 = 142 mg, Intravenous,  Once, 4 of 4 cycles Administration: 142 mg (12/15/2018), 142 mg (12/29/2018), 142 mg (01/12/2019), 142 mg (01/26/2019) palonosetron (ALOXI) injection 0.25 mg,  0.25 mg, Intravenous,  Once, 4 of 4 cycles Administration: 0.25 mg (12/15/2018), 0.25 mg (12/29/2018), 0.25 mg (01/12/2019), 0.25 mg (01/26/2019) pegfilgrastim-cbqv (UDENYCA) injection 6 mg, 6 mg, Subcutaneous, Once, 4 of 4 cycles Administration: 6 mg (12/17/2018), 6 mg (12/31/2018), 6 mg (01/14/2019), 6 mg (01/28/2019) cyclophosphamide (CYTOXAN) 1,420 mg in sodium chloride 0.9 % 250 mL chemo infusion, 600 mg/m2 = 1,420 mg, Intravenous,  Once, 4 of 4 cycles Administration: 1,420 mg (12/15/2018), 1,420 mg (12/29/2018), 1,420 mg (01/12/2019), 1,420 mg (01/26/2019) PACLitaxel (TAXOL) 192 mg in sodium chloride 0.9 % 250 mL chemo infusion (</= 19m/m2), 80 mg/m2 = 192 mg, Intravenous,  Once, 1 of 12 cycles Administration: 192 mg (02/09/2019) fosaprepitant (EMEND) 150 mg, dexamethasone (DECADRON) 12 mg in sodium chloride 0.9 % 145 mL IVPB, , Intravenous,  Once, 4 of 4 cycles Administration:  (12/15/2018),  (12/29/2018),  (01/12/2019),  (01/26/2019)  for chemotherapy treatment.      CHIEF COMPLIANT: Cycle 2 Taxol  INTERVAL HISTORY: Kayla Banks a 63y.o. with above-mentioned history of left breast cancer who underwent a lumpectomy followed by an axillary lymph node dissection and iscurrently onadjuvant chemotherapy. She completed 4 cycles of dose dense Adriamycin and Cytoxan and is currently on weekly Taxol treatments. She presents to the clinic todayfor a toxicity check following cycle 1 and for cycle 2.  She tolerated cycle 1 of Taxol extremely well.  She had achiness for a few days which went away.  Denied any nausea or vomiting.  REVIEW OF SYSTEMS:   Constitutional: Denies fevers, chills or abnormal weight loss Eyes:  Denies blurriness of vision Ears, nose, mouth, throat, and face: Denies mucositis or sore throat Respiratory: Denies cough, dyspnea or wheezes Cardiovascular: Denies palpitation, chest discomfort Gastrointestinal: Denies nausea, heartburn or change in bowel habits Skin: Denies abnormal  skin rashes Lymphatics: Denies new lymphadenopathy or easy bruising Neurological: Denies numbness, tingling or new weaknesses Behavioral/Psych: Mood is stable, no new changes  Extremities: No lower extremity edema Breast: denies any pain or lumps or nodules in either breasts All other systems were reviewed with the patient and are negative.  I have reviewed the past medical history, past surgical history, social history and family history with the patient and they are unchanged from previous note.  ALLERGIES:  is allergic to adhesive [tape]; erythromycin; lisinopril; and penicillins.  MEDICATIONS:  Current Outpatient Medications  Medication Sig Dispense Refill  . amLODipine (NORVASC) 5 MG tablet Take 1 tablet (5 mg total) by mouth daily. 90 tablet 1  . carvedilol (COREG) 25 MG tablet TAKE 1 TABLET(25 MG) BY MOUTH TWICE DAILY WITH A MEAL 180 tablet 1  . EPINEPHRINE 0.3 mg/0.3 mL IJ SOAJ injection INJECT 0.3 ML(1 SYRINGE) IN THE MUSCLE AS NEEDED FOR ALLERGIC REACTION 2 Device 0  . furosemide (LASIX) 20 MG tablet TAKE 1 TABLET(20 MG) BY MOUTH DAILY 90 tablet 1  . lidocaine-prilocaine (EMLA) cream Apply to affected area once 30 g 3  . LORazepam (ATIVAN) 0.5 MG tablet TAKE 1 TABLET(0.5 MG) BY MOUTH AT BEDTIME AS NEEDED FOR SLEEP 30 tablet 1  . meloxicam (MOBIC) 15 MG tablet TAKE 1 TABLET(15 MG) BY MOUTH DAILY AS NEEDED FOR PAIN 90 tablet 3  . ondansetron (ZOFRAN) 8 MG tablet Take 1 tablet (8 mg total) by mouth 2 (two) times daily as needed. Start on the third day after chemotherapy. 30 tablet 1  . prochlorperazine (COMPAZINE) 10 MG tablet TAKE 1 TABLET(10 MG) BY MOUTH EVERY 6 HOURS AS NEEDED FOR NAUSEA OR VOMITING 30 tablet 1  . SYNTHROID 100 MCG tablet 1 po qd 90 tablet 3  . valACYclovir (VALTREX) 1000 MG tablet TAKE 1 TABLET BY MOUTH THREE TIMES DAILY, DISCONTINUE ALL PREVIOUS REFILLS FOR THIS MEDICATION 30 tablet 5   No current facility-administered medications for this visit.      PHYSICAL EXAMINATION: ECOG PERFORMANCE STATUS: 1 - Symptomatic but completely ambulatory  Vitals:   02/16/19 0903  BP: (!) 149/69  Pulse: 80  Resp: 17  Temp: 98.2 F (36.8 C)  SpO2: 100%   Filed Weights   02/16/19 0903  Weight: 266 lb 4.8 oz (120.8 kg)    GENERAL: alert, no distress and comfortable SKIN: skin color, texture, turgor are normal, no rashes or significant lesions EYES: normal, Conjunctiva are pink and non-injected, sclera clear OROPHARYNX: no exudate, no erythema and lips, buccal mucosa, and tongue normal  NECK: supple, thyroid normal size, non-tender, without nodularity LYMPH: no palpable lymphadenopathy in the cervical, axillary or inguinal LUNGS: clear to auscultation and percussion with normal breathing effort HEART: regular rate & rhythm and no murmurs and no lower extremity edema ABDOMEN: abdomen soft, non-tender and normal bowel sounds MUSCULOSKELETAL: no cyanosis of digits and no clubbing  NEURO: alert & oriented x 3 with fluent speech, no focal motor/sensory deficits EXTREMITIES: No lower extremity edema  LABORATORY DATA:  I have reviewed the data as listed CMP Latest Ref Rng & Units 02/09/2019 01/26/2019 01/12/2019  Glucose 70 - 99 mg/dL 154(H) 133(H) 101(H)  BUN 8 - 23 mg/dL 9 14 15   Creatinine 0.44 - 1.00 mg/dL 0.75 0.75  0.68  Sodium 135 - 145 mmol/L 141 142 141  Potassium 3.5 - 5.1 mmol/L 3.8 3.9 4.0  Chloride 98 - 111 mmol/L 106 108 108  CO2 22 - 32 mmol/L 26 25 27   Calcium 8.9 - 10.3 mg/dL 8.4(L) 8.6(L) 8.4(L)  Total Protein 6.5 - 8.1 g/dL 6.3(L) 6.5 6.3(L)  Total Bilirubin 0.3 - 1.2 mg/dL 0.3 0.3 0.3  Alkaline Phos 38 - 126 U/L 101 110 90  AST 15 - 41 U/L 15 14(L) 13(L)  ALT 0 - 44 U/L 17 16 16     Lab Results  Component Value Date   WBC 11.8 (H) 02/09/2019   HGB 10.9 (L) 02/09/2019   HCT 34.1 (L) 02/09/2019   MCV 89.0 02/09/2019   PLT 153 02/09/2019   NEUTROABS 8.9 (H) 02/09/2019    ASSESSMENT & PLAN:  Malignant neoplasm of  upper-outer quadrant of left breast in female, estrogen receptor positive ([facility]) Left lumpectomy: Grade 2 IDC with DCIS, 1 cm, margins negative, DCIS focally 0.1 cm from superior margin, 2/2 lymph nodes positive, ER 95%, PR 45%, HER-2 negative, Ki-67 20% AXLND: 3/20 LN Pos (total 5/22)  Treatment Plan: 1. Adj chemo with DD Adriamycin and Cytoxan followed by Taxol weekly X 12 2. Adj XRT 3. Adj Anti estrogen therapy --------------------------------------------------------------------------------------------- Current treatment: Completed 4 cycles of dose dense Adriamycin and Cytoxan, today is cycle 2 Taxol Chemo toxicities:Mild nausea but otherwise tolerated extremely well. Labs reviewed 1.  Chemo induced anemia: Being monitored Encouraged her to eat more protein  Return to clinic weekly for Taxol every other week for follow-up with me.  No orders of the defined types were placed in this encounter.  The patient has a good understanding of the overall plan. she agrees with it. she will call with any problems that may develop before the next visit here.  Nicholas Lose, MD 02/16/2019  Julious Oka Dorshimer am acting as scribe for Dr. Nicholas Lose.  I have reviewed the above documentation for accuracy and completeness, and I agree with the above.

## 2019-02-16 ENCOUNTER — Other Ambulatory Visit: Payer: Self-pay

## 2019-02-16 ENCOUNTER — Inpatient Hospital Stay: Payer: BC Managed Care – PPO

## 2019-02-16 ENCOUNTER — Inpatient Hospital Stay (HOSPITAL_BASED_OUTPATIENT_CLINIC_OR_DEPARTMENT_OTHER): Payer: BC Managed Care – PPO | Admitting: Hematology and Oncology

## 2019-02-16 ENCOUNTER — Inpatient Hospital Stay: Payer: BC Managed Care – PPO | Attending: Hematology and Oncology

## 2019-02-16 ENCOUNTER — Encounter: Payer: Self-pay | Admitting: *Deleted

## 2019-02-16 DIAGNOSIS — C50412 Malignant neoplasm of upper-outer quadrant of left female breast: Secondary | ICD-10-CM | POA: Insufficient documentation

## 2019-02-16 DIAGNOSIS — D6481 Anemia due to antineoplastic chemotherapy: Secondary | ICD-10-CM | POA: Diagnosis not present

## 2019-02-16 DIAGNOSIS — T451X5A Adverse effect of antineoplastic and immunosuppressive drugs, initial encounter: Secondary | ICD-10-CM

## 2019-02-16 DIAGNOSIS — Z79899 Other long term (current) drug therapy: Secondary | ICD-10-CM

## 2019-02-16 DIAGNOSIS — Z170 Estrogen receptor positive status [ER+]: Secondary | ICD-10-CM | POA: Diagnosis not present

## 2019-02-16 DIAGNOSIS — Z5111 Encounter for antineoplastic chemotherapy: Secondary | ICD-10-CM

## 2019-02-16 DIAGNOSIS — Z95828 Presence of other vascular implants and grafts: Secondary | ICD-10-CM

## 2019-02-16 LAB — CMP (CANCER CENTER ONLY)
ALT: 30 U/L (ref 0–44)
AST: 21 U/L (ref 15–41)
Albumin: 3.3 g/dL — ABNORMAL LOW (ref 3.5–5.0)
Alkaline Phosphatase: 80 U/L (ref 38–126)
Anion gap: 8 (ref 5–15)
BUN: 19 mg/dL (ref 8–23)
CO2: 25 mmol/L (ref 22–32)
Calcium: 8.6 mg/dL — ABNORMAL LOW (ref 8.9–10.3)
Chloride: 108 mmol/L (ref 98–111)
Creatinine: 0.7 mg/dL (ref 0.44–1.00)
GFR, Est AFR Am: >60 mL/min (ref >60)
GFR, Estimated: >60 mL/min (ref >60)
Glucose, Bld: 97 mg/dL (ref 70–99)
Potassium: 4 mmol/L (ref 3.5–5.1)
Sodium: 141 mmol/L (ref 135–145)
Total Bilirubin: 0.4 mg/dL (ref 0.3–1.2)
Total Protein: 6.4 g/dL — ABNORMAL LOW (ref 6.5–8.1)

## 2019-02-16 LAB — CBC WITH DIFFERENTIAL (CANCER CENTER ONLY)
Abs Immature Granulocytes: 0.11 10*3/uL — ABNORMAL HIGH (ref 0.00–0.07)
Basophils Absolute: 0.1 10*3/uL (ref 0.0–0.1)
Basophils Relative: 1 %
Eosinophils Absolute: 0.1 10*3/uL (ref 0.0–0.5)
Eosinophils Relative: 1 %
HCT: 33.1 % — ABNORMAL LOW (ref 36.0–46.0)
Hemoglobin: 10.5 g/dL — ABNORMAL LOW (ref 12.0–15.0)
Immature Granulocytes: 1 %
Lymphocytes Relative: 10 %
Lymphs Abs: 0.9 10*3/uL (ref 0.7–4.0)
MCH: 28.4 pg (ref 26.0–34.0)
MCHC: 31.7 g/dL (ref 30.0–36.0)
MCV: 89.5 fL (ref 80.0–100.0)
Monocytes Absolute: 1 10*3/uL (ref 0.1–1.0)
Monocytes Relative: 10 %
Neutro Abs: 7.5 10*3/uL (ref 1.7–7.7)
Neutrophils Relative %: 77 %
Platelet Count: 310 10*3/uL (ref 150–400)
RBC: 3.7 MIL/uL — ABNORMAL LOW (ref 3.87–5.11)
RDW: 18.2 % — ABNORMAL HIGH (ref 11.5–15.5)
WBC Count: 9.7 10*3/uL (ref 4.0–10.5)
nRBC: 0 % (ref 0.0–0.2)

## 2019-02-16 MED ORDER — SODIUM CHLORIDE 0.9 % IV SOLN
Freq: Once | INTRAVENOUS | Status: AC
  Administered 2019-02-16: 10:00:00 via INTRAVENOUS
  Filled 2019-02-16: qty 250

## 2019-02-16 MED ORDER — FAMOTIDINE IN NACL 20-0.9 MG/50ML-% IV SOLN
INTRAVENOUS | Status: AC
  Filled 2019-02-16: qty 50

## 2019-02-16 MED ORDER — SODIUM CHLORIDE 0.9% FLUSH
10.0000 mL | INTRAVENOUS | Status: DC | PRN
  Administered 2019-02-16: 12:00:00 10 mL
  Filled 2019-02-16: qty 10

## 2019-02-16 MED ORDER — SODIUM CHLORIDE 0.9% FLUSH
10.0000 mL | INTRAVENOUS | Status: DC | PRN
  Administered 2019-02-16: 10 mL
  Filled 2019-02-16: qty 10

## 2019-02-16 MED ORDER — SODIUM CHLORIDE 0.9 % IV SOLN
20.0000 mg | Freq: Once | INTRAVENOUS | Status: AC
  Administered 2019-02-16: 10:00:00 20 mg via INTRAVENOUS
  Filled 2019-02-16: qty 20

## 2019-02-16 MED ORDER — DIPHENHYDRAMINE HCL 50 MG/ML IJ SOLN
50.0000 mg | Freq: Once | INTRAMUSCULAR | Status: AC
  Administered 2019-02-16: 50 mg via INTRAVENOUS

## 2019-02-16 MED ORDER — DIPHENHYDRAMINE HCL 50 MG/ML IJ SOLN
INTRAMUSCULAR | Status: AC
  Filled 2019-02-16: qty 1

## 2019-02-16 MED ORDER — HEPARIN SOD (PORK) LOCK FLUSH 100 UNIT/ML IV SOLN
500.0000 [IU] | Freq: Once | INTRAVENOUS | Status: AC | PRN
  Administered 2019-02-16: 12:00:00 500 [IU]
  Filled 2019-02-16: qty 5

## 2019-02-16 MED ORDER — SODIUM CHLORIDE 0.9 % IV SOLN
80.0000 mg/m2 | Freq: Once | INTRAVENOUS | Status: AC
  Administered 2019-02-16: 11:00:00 192 mg via INTRAVENOUS
  Filled 2019-02-16: qty 32

## 2019-02-16 MED ORDER — FAMOTIDINE IN NACL 20-0.9 MG/50ML-% IV SOLN
20.0000 mg | Freq: Once | INTRAVENOUS | Status: AC
  Administered 2019-02-16: 10:00:00 20 mg via INTRAVENOUS

## 2019-02-16 NOTE — Progress Notes (Signed)
Error

## 2019-02-16 NOTE — Patient Instructions (Signed)
[facility] Center Discharge Instructions for Patients Receiving Chemotherapy  Today you received the following chemotherapy agents paclitaxel (Taxol) To help prevent nausea and vomiting after your treatment, we encourage you to take your nausea medication as directed   If you develop nausea and vomiting that is not controlled by your nausea medication, call the clinic.   BELOW ARE SYMPTOMS THAT SHOULD BE REPORTED IMMEDIATELY:  *FEVER GREATER THAN 100.5 F  *CHILLS WITH OR WITHOUT FEVER  NAUSEA AND VOMITING THAT IS NOT CONTROLLED WITH YOUR NAUSEA MEDICATION  *UNUSUAL SHORTNESS OF BREATH  *UNUSUAL BRUISING OR BLEEDING  TENDERNESS IN MOUTH AND THROAT WITH OR WITHOUT PRESENCE OF ULCERS  *URINARY PROBLEMS  *BOWEL PROBLEMS  UNUSUAL RASH Items with * indicate a potential emergency and should be followed up as soon as possible.  Feel free to call the clinic should you have any questions or concerns. The clinic phone number is [phone].  Please show the CHEMO ALERT CARD at check-in to the Emergency Department and triage nurse.   

## 2019-02-23 ENCOUNTER — Inpatient Hospital Stay: Payer: BC Managed Care – PPO

## 2019-02-23 ENCOUNTER — Other Ambulatory Visit: Payer: Self-pay

## 2019-02-23 VITALS — BP 114/62 | HR 79 | Temp 97.1°F | Resp 19

## 2019-02-23 DIAGNOSIS — C50412 Malignant neoplasm of upper-outer quadrant of left female breast: Secondary | ICD-10-CM

## 2019-02-23 DIAGNOSIS — Z95828 Presence of other vascular implants and grafts: Secondary | ICD-10-CM

## 2019-02-23 DIAGNOSIS — Z170 Estrogen receptor positive status [ER+]: Secondary | ICD-10-CM

## 2019-02-23 LAB — CBC WITH DIFFERENTIAL (CANCER CENTER ONLY)
Abs Immature Granulocytes: 0.07 10*3/uL (ref 0.00–0.07)
Basophils Absolute: 0.1 10*3/uL (ref 0.0–0.1)
Basophils Relative: 1 %
Eosinophils Absolute: 0.1 10*3/uL (ref 0.0–0.5)
Eosinophils Relative: 1 %
HCT: 33.4 % — ABNORMAL LOW (ref 36.0–46.0)
Hemoglobin: 10.7 g/dL — ABNORMAL LOW (ref 12.0–15.0)
Immature Granulocytes: 1 %
Lymphocytes Relative: 12 %
Lymphs Abs: 0.8 10*3/uL (ref 0.7–4.0)
MCH: 28.9 pg (ref 26.0–34.0)
MCHC: 32 g/dL (ref 30.0–36.0)
MCV: 90.3 fL (ref 80.0–100.0)
Monocytes Absolute: 0.8 10*3/uL (ref 0.1–1.0)
Monocytes Relative: 11 %
Neutro Abs: 5 10*3/uL (ref 1.7–7.7)
Neutrophils Relative %: 74 %
Platelet Count: 280 10*3/uL (ref 150–400)
RBC: 3.7 MIL/uL — ABNORMAL LOW (ref 3.87–5.11)
RDW: 19 % — ABNORMAL HIGH (ref 11.5–15.5)
WBC Count: 6.7 10*3/uL (ref 4.0–10.5)
nRBC: 0 % (ref 0.0–0.2)

## 2019-02-23 LAB — CMP (CANCER CENTER ONLY)
ALT: 29 U/L (ref 0–44)
AST: 23 U/L (ref 15–41)
Albumin: 3.2 g/dL — ABNORMAL LOW (ref 3.5–5.0)
Alkaline Phosphatase: 66 U/L (ref 38–126)
Anion gap: 9 (ref 5–15)
BUN: 15 mg/dL (ref 8–23)
CO2: 26 mmol/L (ref 22–32)
Calcium: 8.8 mg/dL — ABNORMAL LOW (ref 8.9–10.3)
Chloride: 107 mmol/L (ref 98–111)
Creatinine: 0.76 mg/dL (ref 0.44–1.00)
GFR, Est AFR Am: >60 mL/min (ref >60)
GFR, Estimated: >60 mL/min (ref >60)
Glucose, Bld: 112 mg/dL — ABNORMAL HIGH (ref 70–99)
Potassium: 3.9 mmol/L (ref 3.5–5.1)
Sodium: 142 mmol/L (ref 135–145)
Total Bilirubin: 0.4 mg/dL (ref 0.3–1.2)
Total Protein: 6.3 g/dL — ABNORMAL LOW (ref 6.5–8.1)

## 2019-02-23 MED ORDER — SODIUM CHLORIDE 0.9% FLUSH
10.0000 mL | INTRAVENOUS | Status: DC | PRN
  Administered 2019-02-23: 12:00:00 10 mL
  Filled 2019-02-23: qty 10

## 2019-02-23 MED ORDER — FAMOTIDINE IN NACL 20-0.9 MG/50ML-% IV SOLN
20.0000 mg | Freq: Once | INTRAVENOUS | Status: AC
  Administered 2019-02-23: 20 mg via INTRAVENOUS

## 2019-02-23 MED ORDER — SODIUM CHLORIDE 0.9 % IV SOLN
Freq: Once | INTRAVENOUS | Status: AC
  Administered 2019-02-23: 10:00:00 via INTRAVENOUS
  Filled 2019-02-23: qty 250

## 2019-02-23 MED ORDER — SODIUM CHLORIDE 0.9 % IV SOLN
80.0000 mg/m2 | Freq: Once | INTRAVENOUS | Status: AC
  Administered 2019-02-23: 11:00:00 192 mg via INTRAVENOUS
  Filled 2019-02-23: qty 32

## 2019-02-23 MED ORDER — DIPHENHYDRAMINE HCL 50 MG/ML IJ SOLN
INTRAMUSCULAR | Status: AC
  Filled 2019-02-23: qty 1

## 2019-02-23 MED ORDER — SODIUM CHLORIDE 0.9% FLUSH
10.0000 mL | INTRAVENOUS | Status: DC | PRN
  Administered 2019-02-23: 10 mL
  Filled 2019-02-23: qty 10

## 2019-02-23 MED ORDER — DIPHENHYDRAMINE HCL 50 MG/ML IJ SOLN
50.0000 mg | Freq: Once | INTRAMUSCULAR | Status: AC
  Administered 2019-02-23: 50 mg via INTRAVENOUS

## 2019-02-23 MED ORDER — SODIUM CHLORIDE 0.9 % IV SOLN
20.0000 mg | Freq: Once | INTRAVENOUS | Status: AC
  Administered 2019-02-23: 10:00:00 20 mg via INTRAVENOUS
  Filled 2019-02-23: qty 2

## 2019-02-23 MED ORDER — FAMOTIDINE IN NACL 20-0.9 MG/50ML-% IV SOLN
INTRAVENOUS | Status: AC
  Filled 2019-02-23: qty 50

## 2019-02-23 MED ORDER — HEPARIN SOD (PORK) LOCK FLUSH 100 UNIT/ML IV SOLN
500.0000 [IU] | Freq: Once | INTRAVENOUS | Status: AC | PRN
  Administered 2019-02-23: 500 [IU]
  Filled 2019-02-23: qty 5

## 2019-02-23 NOTE — Assessment & Plan Note (Signed)
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 Adj chemo with DD Adriamycin and Cytoxan followed by Taxol weekly X 12 2. Adj XRT 3. Adj Anti estrogen therapy --------------------------------------------------------------------------------------------- Current treatment: Completed 4 cycles of dose dense Adriamycin and Cytoxan, today is cycle 4 Taxol Chemo toxicities:Mild nausea but otherwise tolerated extremely well. Labs reviewed 1.  Chemo induced anemia: Being monitored Encouraged her to eat more protein  Return to clinic weekly for Taxol every other week for follow-up with me.

## 2019-02-23 NOTE — Patient Instructions (Signed)

## 2019-02-23 NOTE — Patient Instructions (Signed)
[facility] Discharge Instructions for Patients Receiving Chemotherapy  Today you received the following chemotherapy agents:  Taxol.  To help prevent nausea and vomiting after your treatment, we encourage you to take your nausea medication as directed.   If you develop nausea and vomiting that is not controlled by your nausea medication, call the clinic.   BELOW ARE SYMPTOMS THAT SHOULD BE REPORTED IMMEDIATELY:  *FEVER GREATER THAN 100.5 F  *CHILLS WITH OR WITHOUT FEVER  NAUSEA AND VOMITING THAT IS NOT CONTROLLED WITH YOUR NAUSEA MEDICATION  *UNUSUAL SHORTNESS OF BREATH  *UNUSUAL BRUISING OR BLEEDING  TENDERNESS IN MOUTH AND THROAT WITH OR WITHOUT PRESENCE OF ULCERS  *URINARY PROBLEMS  *BOWEL PROBLEMS  UNUSUAL RASH Items with * indicate a potential emergency and should be followed up as soon as possible.  Feel free to call the clinic should you have any questions or concerns. The clinic phone number is [phone].  Please show the CHEMO ALERT CARD at check-in to the Emergency Department and triage nurse.   

## 2019-03-01 NOTE — Progress Notes (Signed)
Patient Care Team: Carollee Herter, Alferd Apa, DO as PCP - General Dorna Leitz, MD as Consulting Physician (Orthopedic Surgery) Erroll Luna, MD as Consulting Physician (General Surgery) Nicholas Lose, MD as Consulting Physician (Hematology and Oncology) Kyung Rudd, MD as Consulting Physician (Radiation Oncology) Rockwell Germany, RN as Oncology Nurse Navigator Mauro Kaufmann, RN as Oncology Nurse Navigator  DIAGNOSIS:    ICD-10-CM   1. Malignant neoplasm of upper-outer quadrant of left breast in female, estrogen receptor positive ([place])  C50.412    Z17.0     SUMMARY OF ONCOLOGIC HISTORY: Oncology History  Malignant neoplasm of upper-outer quadrant of left breast in female, estrogen receptor positive ([place])  08/24/2018 Initial Biopsy   Screening detected left breast mass at 2:30 position 6 mm in size with one abnormal lymph node and stable benign calcifications, biopsy of the mass grade 2 IDC with DCIS and lymphovascular invasion, lymph node biopsy is positive, ER 95%, PR 40%, Ki-67 20%, HER-2 -1+ by IHC, T1b N1 stage Ib   08/31/2018 Cancer Staging   Staging form: Breast, AJCC 8th Edition - Clinical: Stage IB (cT1b, cN1(f), cM0, G2, ER+, PR+, HER2-) - Signed by Nicholas Lose, MD on 08/31/2018   10/11/2018 Surgery   Left lumpectomy: Grade 2 IDC with DCIS, 1 cm, margins negative, DCIS focally 0.1 cm from superior margin, 2/2 lymph nodes positive, ER 95%, PR 45%, HER-2 negative, Ki-67 20% AXLND: 3/20 LN Pos (total 5/22)   10/18/2018 Cancer Staging   Staging form: Breast, AJCC 8th Edition - Pathologic stage from 10/18/2018: Stage IB (pT1b, pN2a, cM0, G2, ER+, PR+, HER2-) - Signed by Nicholas Lose, MD on 11/14/2018   12/15/2018 -  Chemotherapy   The patient had DOXOrubicin (ADRIAMYCIN) chemo injection 142 mg, 60 mg/m2 = 142 mg, Intravenous,  Once, 4 of 4 cycles Administration: 142 mg (12/15/2018), 142 mg (12/29/2018), 142 mg (01/12/2019), 142 mg (01/26/2019) palonosetron (ALOXI) injection 0.25 mg,  0.25 mg, Intravenous,  Once, 4 of 4 cycles Administration: 0.25 mg (12/15/2018), 0.25 mg (12/29/2018), 0.25 mg (01/12/2019), 0.25 mg (01/26/2019) pegfilgrastim-cbqv (UDENYCA) injection 6 mg, 6 mg, Subcutaneous, Once, 4 of 4 cycles Administration: 6 mg (12/17/2018), 6 mg (12/31/2018), 6 mg (01/14/2019), 6 mg (01/28/2019) cyclophosphamide (CYTOXAN) 1,420 mg in sodium chloride 0.9 % 250 mL chemo infusion, 600 mg/m2 = 1,420 mg, Intravenous,  Once, 4 of 4 cycles Administration: 1,420 mg (12/15/2018), 1,420 mg (12/29/2018), 1,420 mg (01/12/2019), 1,420 mg (01/26/2019) PACLitaxel (TAXOL) 192 mg in sodium chloride 0.9 % 250 mL chemo infusion (</= 38m/m2), 80 mg/m2 = 192 mg, Intravenous,  Once, 3 of 12 cycles Administration: 192 mg (02/09/2019), 192 mg (02/16/2019), 192 mg (02/23/2019) fosaprepitant (EMEND) 150 mg, dexamethasone (DECADRON) 12 mg in sodium chloride 0.9 % 145 mL IVPB, , Intravenous,  Once, 4 of 4 cycles Administration:  (12/15/2018),  (12/29/2018),  (01/12/2019),  (01/26/2019)  for chemotherapy treatment.      CHIEF COMPLIANT: Cycle 4 Taxol  INTERVAL HISTORY: PMaryanne Huneycuttis a 63y.o. with above-mentioned history of left breast cancer who underwent a lumpectomy followed by an axillary lymph node dissection and iscurrently onadjuvant chemotherapy. She completed 4 cycles ofdose dense Adriamycin and Cytoxan and is currently on weekly Taxol treatments. She presents to the clinic todayfor cycle 4.   REVIEW OF SYSTEMS:   Constitutional: Denies fevers, chills or abnormal weight loss Eyes: Denies blurriness of vision Ears, nose, mouth, throat, and face: Denies mucositis or sore throat Respiratory: Denies cough, dyspnea or wheezes Cardiovascular: Denies palpitation, chest discomfort Gastrointestinal: Denies  nausea, heartburn or change in bowel habits Skin: Denies abnormal skin rashes Lymphatics: Denies new lymphadenopathy or easy bruising Neurological: Denies numbness, tingling or new weaknesses  Behavioral/Psych: Mood is stable, no new changes  Extremities: No lower extremity edema Breast: denies any pain or lumps or nodules in either breasts All other systems were reviewed with the patient and are negative.  I have reviewed the past medical history, past surgical history, social history and family history with the patient and they are unchanged from previous note.  ALLERGIES:  is allergic to adhesive [tape]; erythromycin; lisinopril; and penicillins.  MEDICATIONS:  Current Outpatient Medications  Medication Sig Dispense Refill  . amLODipine (NORVASC) 5 MG tablet Take 1 tablet (5 mg total) by mouth daily. 90 tablet 1  . carvedilol (COREG) 25 MG tablet TAKE 1 TABLET(25 MG) BY MOUTH TWICE DAILY WITH A MEAL 180 tablet 1  . EPINEPHRINE 0.3 mg/0.3 mL IJ SOAJ injection INJECT 0.3 ML(1 SYRINGE) IN THE MUSCLE AS NEEDED FOR ALLERGIC REACTION 2 Device 0  . furosemide (LASIX) 20 MG tablet TAKE 1 TABLET(20 MG) BY MOUTH DAILY 90 tablet 1  . lidocaine-prilocaine (EMLA) cream Apply to affected area once 30 g 3  . LORazepam (ATIVAN) 0.5 MG tablet TAKE 1 TABLET(0.5 MG) BY MOUTH AT BEDTIME AS NEEDED FOR SLEEP 30 tablet 1  . meloxicam (MOBIC) 15 MG tablet TAKE 1 TABLET(15 MG) BY MOUTH DAILY AS NEEDED FOR PAIN 90 tablet 3  . ondansetron (ZOFRAN) 8 MG tablet Take 1 tablet (8 mg total) by mouth 2 (two) times daily as needed. Start on the third day after chemotherapy. 30 tablet 1  . prochlorperazine (COMPAZINE) 10 MG tablet TAKE 1 TABLET(10 MG) BY MOUTH EVERY 6 HOURS AS NEEDED FOR NAUSEA OR VOMITING 30 tablet 1  . SYNTHROID 100 MCG tablet 1 po qd 90 tablet 3  . valACYclovir (VALTREX) 1000 MG tablet TAKE 1 TABLET BY MOUTH THREE TIMES DAILY, DISCONTINUE ALL PREVIOUS REFILLS FOR THIS MEDICATION 30 tablet 5   No current facility-administered medications for this visit.     PHYSICAL EXAMINATION: ECOG PERFORMANCE STATUS: 1 - Symptomatic but completely ambulatory  Vitals:   03/02/19 1054  BP: 135/70   Pulse: 89  Resp: 17  Temp: 97.9 F (36.6 C)  SpO2: 99%   Filed Weights   03/02/19 1054  Weight: 266 lb 4.8 oz (120.8 kg)    GENERAL: alert, no distress and comfortable SKIN: skin color, texture, turgor are normal, no rashes or significant lesions EYES: normal, Conjunctiva are pink and non-injected, sclera clear OROPHARYNX: no exudate, no erythema and lips, buccal mucosa, and tongue normal  NECK: supple, thyroid normal size, non-tender, without nodularity LYMPH: no palpable lymphadenopathy in the cervical, axillary or inguinal LUNGS: clear to auscultation and percussion with normal breathing effort HEART: regular rate & rhythm and no murmurs and no lower extremity edema ABDOMEN: abdomen soft, non-tender and normal bowel sounds MUSCULOSKELETAL: no cyanosis of digits and no clubbing  NEURO: alert & oriented x 3 with fluent speech, no focal motor/sensory deficits EXTREMITIES: No lower extremity edema  LABORATORY DATA:  I have reviewed the data as listed CMP Latest Ref Rng & Units 02/23/2019 02/16/2019 02/09/2019  Glucose 70 - 99 mg/dL 112(H) 97 154(H)  BUN 8 - 23 mg/dL _0 Creatinine 0.44 - 1.00 mg/dL 0.76 0.70 0.75  Sodium 135 - 145 mmol/L 142 141 141  Potassium 3.5 - 5.1 mmol/L 3.9 4.0 3.8  Chloride 98 - 111 mmol/L 107 108 106  CO2 22 - 32 mmol/L _0 Calcium 8.9 - 10.3 mg/dL 8.8(L) 8.6(L) 8.4(L)  Total Protein 6.5 - 8.1 g/dL 6.3(L) 6.4(L) 6.3(L)  Total Bilirubin 0.3 - 1.2 mg/dL 0.4 0.4 0.3  Alkaline Phos 38 - 126 U/L 66 80 101  AST 15 - 41 U/L _1 ALT 0 - 44 U/L _2 Lab Results  Component Value Date   WBC 7.9 03/02/2019   HGB 10.5 (L) 03/02/2019   HCT 32.6 (L) 03/02/2019   MCV 90.6 03/02/2019   PLT 245 03/02/2019   NEUTROABS 6.1 03/02/2019    ASSESSMENT & PLAN:  Malignant neoplasm of upper-outer quadrant of left breast in female, estrogen receptor positive ([facility]) Left lumpectomy: Grade 2 IDC with DCIS, 1 cm, margins negative, DCIS focally 0.1 cm  from superior margin, 2/2 lymph nodes positive, ER 95%, PR 45%, HER-2 negative, Ki-67 20% AXLND: 3/20 LN Pos (total 5/22)  Treatment Plan: 1. Adj chemo with DD Adriamycin and Cytoxan followed by Taxol weekly X 12 2. Adj XRT 3. Adj Anti estrogen therapy --------------------------------------------------------------------------------------------- Current treatment: Completed 4 cycles of dose dense Adriamycin and Cytoxan, today is cycle 4 Taxol Chemo toxicities:Mild nausea but otherwise tolerated extremely well. Labs reviewed 1.  Chemo induced anemia: Being monitored Encouraged her to eat more protein  Return to clinic weekly for Taxol every other week for follow-up with me.    No orders of the defined types were placed in this encounter.  The patient has a good understanding of the overall plan. she agrees with it. she will call with any problems that may develop before the next visit here.  Nicholas Lose, MD 03/02/2019  Julious Oka Dorshimer am acting as scribe for Dr. Nicholas Lose.  I have reviewed the above documentation for accuracy and completeness, and I agree with the above.

## 2019-03-02 ENCOUNTER — Inpatient Hospital Stay: Payer: BC Managed Care – PPO

## 2019-03-02 ENCOUNTER — Encounter: Payer: Self-pay | Admitting: *Deleted

## 2019-03-02 ENCOUNTER — Other Ambulatory Visit: Payer: Self-pay

## 2019-03-02 ENCOUNTER — Inpatient Hospital Stay (HOSPITAL_BASED_OUTPATIENT_CLINIC_OR_DEPARTMENT_OTHER): Payer: BC Managed Care – PPO | Admitting: Hematology and Oncology

## 2019-03-02 DIAGNOSIS — D6481 Anemia due to antineoplastic chemotherapy: Secondary | ICD-10-CM

## 2019-03-02 DIAGNOSIS — C50412 Malignant neoplasm of upper-outer quadrant of left female breast: Secondary | ICD-10-CM

## 2019-03-02 DIAGNOSIS — Z170 Estrogen receptor positive status [ER+]: Secondary | ICD-10-CM | POA: Diagnosis not present

## 2019-03-02 DIAGNOSIS — T451X5A Adverse effect of antineoplastic and immunosuppressive drugs, initial encounter: Secondary | ICD-10-CM

## 2019-03-02 DIAGNOSIS — Z5111 Encounter for antineoplastic chemotherapy: Secondary | ICD-10-CM

## 2019-03-02 DIAGNOSIS — Z79899 Other long term (current) drug therapy: Secondary | ICD-10-CM | POA: Diagnosis not present

## 2019-03-02 DIAGNOSIS — Z95828 Presence of other vascular implants and grafts: Secondary | ICD-10-CM

## 2019-03-02 LAB — CBC WITH DIFFERENTIAL (CANCER CENTER ONLY)
Abs Immature Granulocytes: 0.07 10*3/uL (ref 0.00–0.07)
Basophils Absolute: 0.1 10*3/uL (ref 0.0–0.1)
Basophils Relative: 1 %
Eosinophils Absolute: 0.1 10*3/uL (ref 0.0–0.5)
Eosinophils Relative: 2 %
HCT: 32.6 % — ABNORMAL LOW (ref 36.0–46.0)
Hemoglobin: 10.5 g/dL — ABNORMAL LOW (ref 12.0–15.0)
Immature Granulocytes: 1 %
Lymphocytes Relative: 11 %
Lymphs Abs: 0.9 10*3/uL (ref 0.7–4.0)
MCH: 29.2 pg (ref 26.0–34.0)
MCHC: 32.2 g/dL (ref 30.0–36.0)
MCV: 90.6 fL (ref 80.0–100.0)
Monocytes Absolute: 0.6 10*3/uL (ref 0.1–1.0)
Monocytes Relative: 8 %
Neutro Abs: 6.1 10*3/uL (ref 1.7–7.7)
Neutrophils Relative %: 77 %
Platelet Count: 245 10*3/uL (ref 150–400)
RBC: 3.6 MIL/uL — ABNORMAL LOW (ref 3.87–5.11)
RDW: 19 % — ABNORMAL HIGH (ref 11.5–15.5)
WBC Count: 7.9 10*3/uL (ref 4.0–10.5)
nRBC: 0 % (ref 0.0–0.2)

## 2019-03-02 LAB — CMP (CANCER CENTER ONLY)
ALT: 26 U/L (ref 0–44)
AST: 20 U/L (ref 15–41)
Albumin: 3.2 g/dL — ABNORMAL LOW (ref 3.5–5.0)
Alkaline Phosphatase: 60 U/L (ref 38–126)
Anion gap: 8 (ref 5–15)
BUN: 12 mg/dL (ref 8–23)
CO2: 27 mmol/L (ref 22–32)
Calcium: 8.7 mg/dL — ABNORMAL LOW (ref 8.9–10.3)
Chloride: 105 mmol/L (ref 98–111)
Creatinine: 0.69 mg/dL (ref 0.44–1.00)
GFR, Est AFR Am: >60 mL/min (ref >60)
GFR, Estimated: >60 mL/min (ref >60)
Glucose, Bld: 96 mg/dL (ref 70–99)
Potassium: 4 mmol/L (ref 3.5–5.1)
Sodium: 140 mmol/L (ref 135–145)
Total Bilirubin: 0.6 mg/dL (ref 0.3–1.2)
Total Protein: 6.4 g/dL — ABNORMAL LOW (ref 6.5–8.1)

## 2019-03-02 MED ORDER — SODIUM CHLORIDE 0.9 % IV SOLN
Freq: Once | INTRAVENOUS | Status: AC
  Administered 2019-03-02: 11:00:00 via INTRAVENOUS
  Filled 2019-03-02: qty 250

## 2019-03-02 MED ORDER — SODIUM CHLORIDE 0.9% FLUSH
10.0000 mL | INTRAVENOUS | Status: DC | PRN
  Administered 2019-03-02: 10 mL
  Filled 2019-03-02: qty 10

## 2019-03-02 MED ORDER — SODIUM CHLORIDE 0.9% FLUSH
10.0000 mL | INTRAVENOUS | Status: DC | PRN
  Administered 2019-03-02: 14:00:00 10 mL
  Filled 2019-03-02: qty 10

## 2019-03-02 MED ORDER — DIPHENHYDRAMINE HCL 50 MG/ML IJ SOLN
50.0000 mg | Freq: Once | INTRAMUSCULAR | Status: AC
  Administered 2019-03-02: 50 mg via INTRAVENOUS

## 2019-03-02 MED ORDER — HEPARIN SOD (PORK) LOCK FLUSH 100 UNIT/ML IV SOLN
500.0000 [IU] | Freq: Once | INTRAVENOUS | Status: AC | PRN
  Administered 2019-03-02: 500 [IU]
  Filled 2019-03-02: qty 5

## 2019-03-02 MED ORDER — FAMOTIDINE IN NACL 20-0.9 MG/50ML-% IV SOLN
20.0000 mg | Freq: Once | INTRAVENOUS | Status: AC
  Administered 2019-03-02: 20 mg via INTRAVENOUS

## 2019-03-02 MED ORDER — SODIUM CHLORIDE 0.9 % IV SOLN
80.0000 mg/m2 | Freq: Once | INTRAVENOUS | Status: AC
  Administered 2019-03-02: 192 mg via INTRAVENOUS
  Filled 2019-03-02: qty 32

## 2019-03-02 MED ORDER — DIPHENHYDRAMINE HCL 50 MG/ML IJ SOLN
INTRAMUSCULAR | Status: AC
  Filled 2019-03-02: qty 1

## 2019-03-02 MED ORDER — SODIUM CHLORIDE 0.9 % IV SOLN
20.0000 mg | Freq: Once | INTRAVENOUS | Status: AC
  Administered 2019-03-02: 12:00:00 20 mg via INTRAVENOUS
  Filled 2019-03-02: qty 20

## 2019-03-02 MED ORDER — FAMOTIDINE IN NACL 20-0.9 MG/50ML-% IV SOLN
INTRAVENOUS | Status: AC
  Filled 2019-03-02: qty 50

## 2019-03-02 NOTE — Patient Instructions (Signed)
[facility] Discharge Instructions for Patients Receiving Chemotherapy  Today you received the following chemotherapy agents:  Taxol.  To help prevent nausea and vomiting after your treatment, we encourage you to take your nausea medication as directed.   If you develop nausea and vomiting that is not controlled by your nausea medication, call the clinic.   BELOW ARE SYMPTOMS THAT SHOULD BE REPORTED IMMEDIATELY:  *FEVER GREATER THAN 100.5 F  *CHILLS WITH OR WITHOUT FEVER  NAUSEA AND VOMITING THAT IS NOT CONTROLLED WITH YOUR NAUSEA MEDICATION  *UNUSUAL SHORTNESS OF BREATH  *UNUSUAL BRUISING OR BLEEDING  TENDERNESS IN MOUTH AND THROAT WITH OR WITHOUT PRESENCE OF ULCERS  *URINARY PROBLEMS  *BOWEL PROBLEMS  UNUSUAL RASH Items with * indicate a potential emergency and should be followed up as soon as possible.  Feel free to call the clinic should you have any questions or concerns. The clinic phone number is [phone].  Please show the CHEMO ALERT CARD at check-in to the Emergency Department and triage nurse.   

## 2019-03-09 ENCOUNTER — Inpatient Hospital Stay: Payer: BC Managed Care – PPO

## 2019-03-09 ENCOUNTER — Other Ambulatory Visit: Payer: Self-pay

## 2019-03-09 VITALS — BP 153/66 | HR 74 | Temp 97.3°F | Resp 16 | Wt 265.0 lb

## 2019-03-09 DIAGNOSIS — C50412 Malignant neoplasm of upper-outer quadrant of left female breast: Secondary | ICD-10-CM

## 2019-03-09 DIAGNOSIS — Z170 Estrogen receptor positive status [ER+]: Secondary | ICD-10-CM

## 2019-03-09 LAB — CBC WITH DIFFERENTIAL (CANCER CENTER ONLY)
Abs Immature Granulocytes: 0.07 10*3/uL (ref 0.00–0.07)
Basophils Absolute: 0.1 10*3/uL (ref 0.0–0.1)
Basophils Relative: 1 %
Eosinophils Absolute: 0.1 10*3/uL (ref 0.0–0.5)
Eosinophils Relative: 2 %
HCT: 32.3 % — ABNORMAL LOW (ref 36.0–46.0)
Hemoglobin: 10.6 g/dL — ABNORMAL LOW (ref 12.0–15.0)
Immature Granulocytes: 1 %
Lymphocytes Relative: 12 %
Lymphs Abs: 0.8 10*3/uL (ref 0.7–4.0)
MCH: 30.3 pg (ref 26.0–34.0)
MCHC: 32.8 g/dL (ref 30.0–36.0)
MCV: 92.3 fL (ref 80.0–100.0)
Monocytes Absolute: 0.5 10*3/uL (ref 0.1–1.0)
Monocytes Relative: 7 %
Neutro Abs: 5.5 10*3/uL (ref 1.7–7.7)
Neutrophils Relative %: 77 %
Platelet Count: 227 10*3/uL (ref 150–400)
RBC: 3.5 MIL/uL — ABNORMAL LOW (ref 3.87–5.11)
RDW: 18.8 % — ABNORMAL HIGH (ref 11.5–15.5)
WBC Count: 7.1 10*3/uL (ref 4.0–10.5)
nRBC: 0 % (ref 0.0–0.2)

## 2019-03-09 LAB — CMP (CANCER CENTER ONLY)
ALT: 22 U/L (ref 0–44)
AST: 16 U/L (ref 15–41)
Albumin: 3.2 g/dL — ABNORMAL LOW (ref 3.5–5.0)
Alkaline Phosphatase: 68 U/L (ref 38–126)
Anion gap: 9 (ref 5–15)
BUN: 17 mg/dL (ref 8–23)
CO2: 24 mmol/L (ref 22–32)
Calcium: 8.6 mg/dL — ABNORMAL LOW (ref 8.9–10.3)
Chloride: 109 mmol/L (ref 98–111)
Creatinine: 0.7 mg/dL (ref 0.44–1.00)
GFR, Est AFR Am: >60 mL/min (ref >60)
GFR, Estimated: >60 mL/min (ref >60)
Glucose, Bld: 97 mg/dL (ref 70–99)
Potassium: 3.8 mmol/L (ref 3.5–5.1)
Sodium: 142 mmol/L (ref 135–145)
Total Bilirubin: 0.6 mg/dL (ref 0.3–1.2)
Total Protein: 6.3 g/dL — ABNORMAL LOW (ref 6.5–8.1)

## 2019-03-09 MED ORDER — DIPHENHYDRAMINE HCL 50 MG/ML IJ SOLN
INTRAMUSCULAR | Status: AC
  Filled 2019-03-09: qty 1

## 2019-03-09 MED ORDER — DIPHENHYDRAMINE HCL 50 MG/ML IJ SOLN
50.0000 mg | Freq: Once | INTRAMUSCULAR | Status: AC
  Administered 2019-03-09: 09:00:00 50 mg via INTRAVENOUS

## 2019-03-09 MED ORDER — FAMOTIDINE IN NACL 20-0.9 MG/50ML-% IV SOLN
INTRAVENOUS | Status: AC
  Filled 2019-03-09: qty 50

## 2019-03-09 MED ORDER — FAMOTIDINE IN NACL 20-0.9 MG/50ML-% IV SOLN
20.0000 mg | Freq: Once | INTRAVENOUS | Status: AC
  Administered 2019-03-09: 20 mg via INTRAVENOUS

## 2019-03-09 MED ORDER — HEPARIN SOD (PORK) LOCK FLUSH 100 UNIT/ML IV SOLN
500.0000 [IU] | Freq: Once | INTRAVENOUS | Status: AC | PRN
  Administered 2019-03-09: 12:00:00 500 [IU]
  Filled 2019-03-09: qty 5

## 2019-03-09 MED ORDER — SODIUM CHLORIDE 0.9 % IV SOLN
Freq: Once | INTRAVENOUS | Status: AC
  Administered 2019-03-09: 09:00:00 via INTRAVENOUS
  Filled 2019-03-09: qty 250

## 2019-03-09 MED ORDER — SODIUM CHLORIDE 0.9 % IV SOLN
80.0000 mg/m2 | Freq: Once | INTRAVENOUS | Status: AC
  Administered 2019-03-09: 10:00:00 192 mg via INTRAVENOUS
  Filled 2019-03-09: qty 32

## 2019-03-09 MED ORDER — SODIUM CHLORIDE 0.9% FLUSH
10.0000 mL | INTRAVENOUS | Status: DC | PRN
  Administered 2019-03-09: 12:00:00 10 mL
  Filled 2019-03-09: qty 10

## 2019-03-09 MED ORDER — SODIUM CHLORIDE 0.9 % IV SOLN
20.0000 mg | Freq: Once | INTRAVENOUS | Status: AC
  Administered 2019-03-09: 10:00:00 20 mg via INTRAVENOUS
  Filled 2019-03-09: qty 20

## 2019-03-09 NOTE — Patient Instructions (Signed)
[facility] Discharge Instructions for Patients Receiving Chemotherapy  Today you received the following chemotherapy agents:  Taxol.  To help prevent nausea and vomiting after your treatment, we encourage you to take your nausea medication as directed.   If you develop nausea and vomiting that is not controlled by your nausea medication, call the clinic.   BELOW ARE SYMPTOMS THAT SHOULD BE REPORTED IMMEDIATELY:  *FEVER GREATER THAN 100.5 F  *CHILLS WITH OR WITHOUT FEVER  NAUSEA AND VOMITING THAT IS NOT CONTROLLED WITH YOUR NAUSEA MEDICATION  *UNUSUAL SHORTNESS OF BREATH  *UNUSUAL BRUISING OR BLEEDING  TENDERNESS IN MOUTH AND THROAT WITH OR WITHOUT PRESENCE OF ULCERS  *URINARY PROBLEMS  *BOWEL PROBLEMS  UNUSUAL RASH Items with * indicate a potential emergency and should be followed up as soon as possible.  Feel free to call the clinic should you have any questions or concerns. The clinic phone number is [phone].  Please show the CHEMO ALERT CARD at check-in to the Emergency Department and triage nurse.   

## 2019-03-09 NOTE — Progress Notes (Signed)
1025 Pt utilizing cryotherapy to hands and feet.

## 2019-03-10 NOTE — Assessment & Plan Note (Signed)
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 Adj chemo with DD Adriamycin and Cytoxan followed by Taxol weekly X 12 2. Adj XRT 3. Adj Anti estrogen therapy --------------------------------------------------------------------------------------------- Current treatment:Completed 4 cycles ofdose dense Adriamycin and Cytoxan, today is cycle6Taxol Chemo toxicities:Mild nausea but otherwise tolerated extremely well. Labs reviewed 1.Chemo induced anemia: Being monitored Encouraged her to eat more protein  Return to clinicweekly for Taxol every other week for follow-up with me.

## 2019-03-15 NOTE — Progress Notes (Signed)
Patient Care Team: Carollee Herter, Alferd Apa, DO as PCP - General Dorna Leitz, MD as Consulting Physician (Orthopedic Surgery) Erroll Luna, MD as Consulting Physician (General Surgery) Nicholas Lose, MD as Consulting Physician (Hematology and Oncology) Kyung Rudd, MD as Consulting Physician (Radiation Oncology) Rockwell Germany, RN as Oncology Nurse Navigator Mauro Kaufmann, RN as Oncology Nurse Navigator  DIAGNOSIS:    ICD-10-CM   1. Malignant neoplasm of upper-outer quadrant of left breast in female, estrogen receptor positive ([place])  C50.412    Z17.0     SUMMARY OF ONCOLOGIC HISTORY: Oncology History  Malignant neoplasm of upper-outer quadrant of left breast in female, estrogen receptor positive ([place])  08/24/2018 Initial Biopsy   Screening detected left breast mass at 2:30 position 6 mm in size with one abnormal lymph node and stable benign calcifications, biopsy of the mass grade 2 IDC with DCIS and lymphovascular invasion, lymph node biopsy is positive, ER 95%, PR 40%, Ki-67 20%, HER-2 -1+ by IHC, T1b N1 stage Ib   08/31/2018 Cancer Staging   Staging form: Breast, AJCC 8th Edition - Clinical: Stage IB (cT1b, cN1(f), cM0, G2, ER+, PR+, HER2-) - Signed by Nicholas Lose, MD on 08/31/2018   10/11/2018 Surgery   Left lumpectomy: Grade 2 IDC with DCIS, 1 cm, margins negative, DCIS focally 0.1 cm from superior margin, 2/2 lymph nodes positive, ER 95%, PR 45%, HER-2 negative, Ki-67 20% AXLND: 3/20 LN Pos (total 5/22)   10/18/2018 Cancer Staging   Staging form: Breast, AJCC 8th Edition - Pathologic stage from 10/18/2018: Stage IB (pT1b, pN2a, cM0, G2, ER+, PR+, HER2-) - Signed by Nicholas Lose, MD on 11/14/2018   12/15/2018 -  Chemotherapy   The patient had DOXOrubicin (ADRIAMYCIN) chemo injection 142 mg, 60 mg/m2 = 142 mg, Intravenous,  Once, 4 of 4 cycles Administration: 142 mg (12/15/2018), 142 mg (12/29/2018), 142 mg (01/12/2019), 142 mg (01/26/2019) palonosetron (ALOXI) injection 0.25 mg,  0.25 mg, Intravenous,  Once, 4 of 4 cycles Administration: 0.25 mg (12/15/2018), 0.25 mg (12/29/2018), 0.25 mg (01/12/2019), 0.25 mg (01/26/2019) pegfilgrastim-cbqv (UDENYCA) injection 6 mg, 6 mg, Subcutaneous, Once, 4 of 4 cycles Administration: 6 mg (12/17/2018), 6 mg (12/31/2018), 6 mg (01/14/2019), 6 mg (01/28/2019) cyclophosphamide (CYTOXAN) 1,420 mg in sodium chloride 0.9 % 250 mL chemo infusion, 600 mg/m2 = 1,420 mg, Intravenous,  Once, 4 of 4 cycles Administration: 1,420 mg (12/15/2018), 1,420 mg (12/29/2018), 1,420 mg (01/12/2019), 1,420 mg (01/26/2019) PACLitaxel (TAXOL) 192 mg in sodium chloride 0.9 % 250 mL chemo infusion (</= 23m/m2), 80 mg/m2 = 192 mg, Intravenous,  Once, 5 of 12 cycles Administration: 192 mg (02/09/2019), 192 mg (02/16/2019), 192 mg (02/23/2019), 192 mg (03/02/2019), 192 mg (03/09/2019) fosaprepitant (EMEND) 150 mg, dexamethasone (DECADRON) 12 mg in sodium chloride 0.9 % 145 mL IVPB, , Intravenous,  Once, 4 of 4 cycles Administration:  (12/15/2018),  (12/29/2018),  (01/12/2019),  (01/26/2019)  for chemotherapy treatment.      CHIEF COMPLIANT: Cycle 6 Taxol  INTERVAL HISTORY: Kayla Banks a 63y.o. with above-mentioned history of left breast cancer who underwent a lumpectomy followed by an axillary lymph node dissection and iscurrently onadjuvant chemotherapy. She completed 4 cycles ofdose dense Adriamycin and Cytoxanand is currently on weekly Taxol treatments. She presents to the clinic todayfor cycle 6.   REVIEW OF SYSTEMS:   Constitutional: Denies fevers, chills or abnormal weight loss Eyes: Denies blurriness of vision Ears, nose, mouth, throat, and face: Denies mucositis or sore throat Respiratory: Denies cough, dyspnea or wheezes Cardiovascular: Denies  palpitation, chest discomfort Gastrointestinal: Denies nausea, heartburn or change in bowel habits Skin: Denies abnormal skin rashes Lymphatics: Denies new lymphadenopathy or easy bruising Neurological: Denies  numbness, tingling or new weaknesses Behavioral/Psych: Mood is stable, no new changes  Extremities: No lower extremity edema Breast: denies any pain or lumps or nodules in either breasts All other systems were reviewed with the patient and are negative.  I have reviewed the past medical history, past surgical history, social history and family history with the patient and they are unchanged from previous note.  ALLERGIES:  is allergic to adhesive [tape]; erythromycin; lisinopril; and penicillins.  MEDICATIONS:  Current Outpatient Medications  Medication Sig Dispense Refill  . amLODipine (NORVASC) 5 MG tablet Take 1 tablet (5 mg total) by mouth daily. 90 tablet 1  . carvedilol (COREG) 25 MG tablet TAKE 1 TABLET(25 MG) BY MOUTH TWICE DAILY WITH A MEAL 180 tablet 1  . EPINEPHRINE 0.3 mg/0.3 mL IJ SOAJ injection INJECT 0.3 ML(1 SYRINGE) IN THE MUSCLE AS NEEDED FOR ALLERGIC REACTION 2 Device 0  . furosemide (LASIX) 20 MG tablet TAKE 1 TABLET(20 MG) BY MOUTH DAILY 90 tablet 1  . lidocaine-prilocaine (EMLA) cream Apply to affected area once 30 g 3  . LORazepam (ATIVAN) 0.5 MG tablet TAKE 1 TABLET(0.5 MG) BY MOUTH AT BEDTIME AS NEEDED FOR SLEEP 30 tablet 1  . meloxicam (MOBIC) 15 MG tablet TAKE 1 TABLET(15 MG) BY MOUTH DAILY AS NEEDED FOR PAIN 90 tablet 3  . ondansetron (ZOFRAN) 8 MG tablet Take 1 tablet (8 mg total) by mouth 2 (two) times daily as needed. Start on the third day after chemotherapy. 30 tablet 1  . prochlorperazine (COMPAZINE) 10 MG tablet TAKE 1 TABLET(10 MG) BY MOUTH EVERY 6 HOURS AS NEEDED FOR NAUSEA OR VOMITING 30 tablet 1  . SYNTHROID 100 MCG tablet 1 po qd 90 tablet 3  . valACYclovir (VALTREX) 1000 MG tablet TAKE 1 TABLET BY MOUTH THREE TIMES DAILY, DISCONTINUE ALL PREVIOUS REFILLS FOR THIS MEDICATION 30 tablet 5   No current facility-administered medications for this visit.     PHYSICAL EXAMINATION: ECOG PERFORMANCE STATUS: 1 - Symptomatic but completely ambulatory   Vitals:   03/16/19 0930  BP: (!) 149/72  Pulse: 83  Resp: 16  Temp: 98.3 F (36.8 C)  SpO2: 100%   Filed Weights   03/16/19 0930  Weight: 264 lb 3.2 oz (119.8 kg)    GENERAL: alert, no distress and comfortable SKIN: skin color, texture, turgor are normal, no rashes or significant lesions EYES: normal, Conjunctiva are pink and non-injected, sclera clear OROPHARYNX: no exudate, no erythema and lips, buccal mucosa, and tongue normal  NECK: supple, thyroid normal size, non-tender, without nodularity LYMPH: no palpable lymphadenopathy in the cervical, axillary or inguinal LUNGS: clear to auscultation and percussion with normal breathing effort HEART: regular rate & rhythm and no murmurs and no lower extremity edema ABDOMEN: abdomen soft, non-tender and normal bowel sounds MUSCULOSKELETAL: no cyanosis of digits and no clubbing  NEURO: alert & oriented x 3 with fluent speech, no focal motor/sensory deficits EXTREMITIES: No lower extremity edema  LABORATORY DATA:  I have reviewed the data as listed CMP Latest Ref Rng & Units 03/09/2019 03/02/2019 02/23/2019  Glucose 70 - 99 mg/dL 97 96 112(H)  BUN 8 - 23 mg/dL _0 Creatinine 0.44 - 1.00 mg/dL 0.70 0.69 0.76  Sodium 135 - 145 mmol/L 142 140 142  Potassium 3.5 - 5.1 mmol/L 3.8 4.0 3.9  Chloride 98 -  111 mmol/L 109 105 107  CO2 22 - 32 mmol/L _0 Calcium 8.9 - 10.3 mg/dL 8.6(L) 8.7(L) 8.8(L)  Total Protein 6.5 - 8.1 g/dL 6.3(L) 6.4(L) 6.3(L)  Total Bilirubin 0.3 - 1.2 mg/dL 0.6 0.6 0.4  Alkaline Phos 38 - 126 U/L 68 60 66  AST 15 - 41 U/L _1 ALT 0 - 44 U/L _2 Lab Results  Component Value Date   WBC 6.3 03/16/2019   HGB 10.7 (L) 03/16/2019   HCT 33.0 (L) 03/16/2019   MCV 92.4 03/16/2019   PLT 240 03/16/2019   NEUTROABS 4.7 03/16/2019    ASSESSMENT & PLAN:  Malignant neoplasm of upper-outer quadrant of left breast in female, estrogen receptor positive ([facility]) Left lumpectomy: Grade 2 IDC with DCIS, 1  cm, margins negative, DCIS focally 0.1 cm from superior margin, 2/2 lymph nodes positive, ER 95%, PR 45%, HER-2 negative, Ki-67 20% AXLND: 3/20 LN Pos (total 5/22)  Treatment Plan: 1. Adj chemo with DD Adriamycin and Cytoxan followed by Taxol weekly X 12 2. Adj XRT 3. Adj Anti estrogen therapy --------------------------------------------------------------------------------------------- Current treatment:Completed 4 cycles ofdose dense Adriamycin and Cytoxan, today is cycle6Taxol Chemo toxicities:Nausea has resolved. Labs reviewed 1.Chemo induced anemia: Being monitored, today's hemoglobin 10.7 2.  Monitoring closely for peripheral neuropathy.  She felt mild numbness in the morning but then went away.  If it is persistent we may have to reduce the dosage of her chemo. Encouraged her to eat more protein  Return to clinicweekly for Taxol every other week for follow-up with me.   No orders of the defined types were placed in this encounter.  The patient has a good understanding of the overall plan. she agrees with it. she will call with any problems that may develop before the next visit here.  Nicholas Lose, MD 03/16/2019  Julious Oka Dorshimer am acting as scribe for Dr. Nicholas Lose.  I have reviewed the above documentation for accuracy and completeness, and I agree with the above.

## 2019-03-16 ENCOUNTER — Other Ambulatory Visit: Payer: Self-pay

## 2019-03-16 ENCOUNTER — Inpatient Hospital Stay: Payer: BC Managed Care – PPO

## 2019-03-16 ENCOUNTER — Inpatient Hospital Stay (HOSPITAL_BASED_OUTPATIENT_CLINIC_OR_DEPARTMENT_OTHER): Payer: BC Managed Care – PPO | Admitting: Hematology and Oncology

## 2019-03-16 DIAGNOSIS — C50412 Malignant neoplasm of upper-outer quadrant of left female breast: Secondary | ICD-10-CM

## 2019-03-16 DIAGNOSIS — Z79899 Other long term (current) drug therapy: Secondary | ICD-10-CM | POA: Diagnosis not present

## 2019-03-16 DIAGNOSIS — Z170 Estrogen receptor positive status [ER+]: Secondary | ICD-10-CM

## 2019-03-16 DIAGNOSIS — Z5111 Encounter for antineoplastic chemotherapy: Secondary | ICD-10-CM | POA: Diagnosis not present

## 2019-03-16 DIAGNOSIS — Z95828 Presence of other vascular implants and grafts: Secondary | ICD-10-CM

## 2019-03-16 DIAGNOSIS — T451X5A Adverse effect of antineoplastic and immunosuppressive drugs, initial encounter: Secondary | ICD-10-CM

## 2019-03-16 DIAGNOSIS — D6481 Anemia due to antineoplastic chemotherapy: Secondary | ICD-10-CM

## 2019-03-16 LAB — CMP (CANCER CENTER ONLY)
ALT: 21 U/L (ref 0–44)
AST: 17 U/L (ref 15–41)
Albumin: 3.4 g/dL — ABNORMAL LOW (ref 3.5–5.0)
Alkaline Phosphatase: 63 U/L (ref 38–126)
Anion gap: 8 (ref 5–15)
BUN: 11 mg/dL (ref 8–23)
CO2: 26 mmol/L (ref 22–32)
Calcium: 9 mg/dL (ref 8.9–10.3)
Chloride: 108 mmol/L (ref 98–111)
Creatinine: 0.74 mg/dL (ref 0.44–1.00)
GFR, Est AFR Am: >60 mL/min (ref >60)
GFR, Estimated: >60 mL/min (ref >60)
Glucose, Bld: 105 mg/dL — ABNORMAL HIGH (ref 70–99)
Potassium: 4.1 mmol/L (ref 3.5–5.1)
Sodium: 142 mmol/L (ref 135–145)
Total Bilirubin: 0.7 mg/dL (ref 0.3–1.2)
Total Protein: 6.4 g/dL — ABNORMAL LOW (ref 6.5–8.1)

## 2019-03-16 LAB — CBC WITH DIFFERENTIAL (CANCER CENTER ONLY)
Abs Immature Granulocytes: 0.06 10*3/uL (ref 0.00–0.07)
Basophils Absolute: 0.1 10*3/uL (ref 0.0–0.1)
Basophils Relative: 1 %
Eosinophils Absolute: 0.1 10*3/uL (ref 0.0–0.5)
Eosinophils Relative: 2 %
HCT: 33 % — ABNORMAL LOW (ref 36.0–46.0)
Hemoglobin: 10.7 g/dL — ABNORMAL LOW (ref 12.0–15.0)
Immature Granulocytes: 1 %
Lymphocytes Relative: 15 %
Lymphs Abs: 0.9 10*3/uL (ref 0.7–4.0)
MCH: 30 pg (ref 26.0–34.0)
MCHC: 32.4 g/dL (ref 30.0–36.0)
MCV: 92.4 fL (ref 80.0–100.0)
Monocytes Absolute: 0.4 10*3/uL (ref 0.1–1.0)
Monocytes Relative: 7 %
Neutro Abs: 4.7 10*3/uL (ref 1.7–7.7)
Neutrophils Relative %: 74 %
Platelet Count: 240 10*3/uL (ref 150–400)
RBC: 3.57 MIL/uL — ABNORMAL LOW (ref 3.87–5.11)
RDW: 18.8 % — ABNORMAL HIGH (ref 11.5–15.5)
WBC Count: 6.3 10*3/uL (ref 4.0–10.5)
nRBC: 0 % (ref 0.0–0.2)

## 2019-03-16 MED ORDER — SODIUM CHLORIDE 0.9% FLUSH
10.0000 mL | INTRAVENOUS | Status: DC | PRN
  Administered 2019-03-16: 09:00:00 10 mL
  Filled 2019-03-16: qty 10

## 2019-03-16 MED ORDER — SODIUM CHLORIDE 0.9 % IV SOLN
80.0000 mg/m2 | Freq: Once | INTRAVENOUS | Status: AC
  Administered 2019-03-16: 12:00:00 192 mg via INTRAVENOUS
  Filled 2019-03-16: qty 32

## 2019-03-16 MED ORDER — FAMOTIDINE IN NACL 20-0.9 MG/50ML-% IV SOLN
INTRAVENOUS | Status: AC
  Filled 2019-03-16: qty 50

## 2019-03-16 MED ORDER — SODIUM CHLORIDE 0.9 % IV SOLN
20.0000 mg | Freq: Once | INTRAVENOUS | Status: AC
  Administered 2019-03-16: 20 mg via INTRAVENOUS
  Filled 2019-03-16: qty 20

## 2019-03-16 MED ORDER — SODIUM CHLORIDE 0.9 % IV SOLN
Freq: Once | INTRAVENOUS | Status: AC
  Administered 2019-03-16: 10:00:00 via INTRAVENOUS
  Filled 2019-03-16: qty 250

## 2019-03-16 MED ORDER — DIPHENHYDRAMINE HCL 50 MG/ML IJ SOLN
50.0000 mg | Freq: Once | INTRAMUSCULAR | Status: AC
  Administered 2019-03-16: 10:00:00 50 mg via INTRAVENOUS

## 2019-03-16 MED ORDER — SODIUM CHLORIDE 0.9% FLUSH
10.0000 mL | INTRAVENOUS | Status: DC | PRN
  Administered 2019-03-16: 10 mL
  Filled 2019-03-16: qty 10

## 2019-03-16 MED ORDER — FAMOTIDINE IN NACL 20-0.9 MG/50ML-% IV SOLN
20.0000 mg | Freq: Once | INTRAVENOUS | Status: AC
  Administered 2019-03-16: 10:00:00 20 mg via INTRAVENOUS

## 2019-03-16 MED ORDER — DIPHENHYDRAMINE HCL 50 MG/ML IJ SOLN
INTRAMUSCULAR | Status: AC
  Filled 2019-03-16: qty 1

## 2019-03-16 MED ORDER — HEPARIN SOD (PORK) LOCK FLUSH 100 UNIT/ML IV SOLN
500.0000 [IU] | Freq: Once | INTRAVENOUS | Status: AC | PRN
  Administered 2019-03-16: 500 [IU]
  Filled 2019-03-16: qty 5

## 2019-03-16 NOTE — Patient Instructions (Signed)
[facility] Discharge Instructions for Patients Receiving Chemotherapy  Today you received the following chemotherapy agents:  Taxol.  To help prevent nausea and vomiting after your treatment, we encourage you to take your nausea medication as directed.   If you develop nausea and vomiting that is not controlled by your nausea medication, call the clinic.   BELOW ARE SYMPTOMS THAT SHOULD BE REPORTED IMMEDIATELY:  *FEVER GREATER THAN 100.5 F  *CHILLS WITH OR WITHOUT FEVER  NAUSEA AND VOMITING THAT IS NOT CONTROLLED WITH YOUR NAUSEA MEDICATION  *UNUSUAL SHORTNESS OF BREATH  *UNUSUAL BRUISING OR BLEEDING  TENDERNESS IN MOUTH AND THROAT WITH OR WITHOUT PRESENCE OF ULCERS  *URINARY PROBLEMS  *BOWEL PROBLEMS  UNUSUAL RASH Items with * indicate a potential emergency and should be followed up as soon as possible.  Feel free to call the clinic should you have any questions or concerns. The clinic phone number is [phone].  Please show the CHEMO ALERT CARD at check-in to the Emergency Department and triage nurse.   

## 2019-03-16 NOTE — Patient Instructions (Signed)

## 2019-03-20 ENCOUNTER — Ambulatory Visit: Payer: Self-pay | Admitting: Surgery

## 2019-03-20 NOTE — H&P (Signed)
Kayla Banks Documented: 03/20/2019 10:57 AM Location: [facility] Patient #: [id number] DOB: 09/06/1955 Single / Language: Kayla Banks / Race: Black or African American Female  History of Present Illness Kayla Banks A. Pegeen Stiger MD; 03/20/2019 11:11 AM) Patient words: Malignant neoplasm of upper-outer quadrant of left breast in female, estrogen receptor positive ([facility]) Left lumpectomy: Grade 2 IDC with DCIS, 1 cm, margins negative, DCIS focally 0.1 cm from superior margin, 2/2 lymph nodes positive, ER 95%, PR 45%, HER-2 negative, Ki-67 20% AXLND: 3/20 LN Pos (total 5/22)  Treatment Plan: 1. Adj chemo with DD Adriamycin and Cytoxan followed by Taxol weekly X 12 2. Adj XRT 3. Adj Anti estrogen therapy   Patient returns for six-month follow-up after breast conserving surgery for stage II left breast cancer status post breast conserving surgery now being treated with chemotherapy. She has another month of chemotherapy left she thinks. She will begin radiation therapy at a later time. The patient's done well with chemotherapy. Denies any significant breast pain, nipple discharge or mass.  The patient is a 63 year old female.   Allergies Sabino Gasser, CMA; 03/20/2019 10:58 AM) Erythromycin Base *MACROLIDES* Swelling. Lisinopril *ANTIHYPERTENSIVES* Swelling. Penicillin G Benzathine & Proc *PENICILLINS* Hives, Swelling. Allergies Reconciled  Medication History Sabino Gasser, CMA; 03/20/2019 10:58 AM) Fluticasone Propionate (50MCG/ACT Suspension, Nasal) Active. Doxycycline Hyclate (100MG Tablet, 1 (one) Oral two times daily, Taken starting 10/21/2018) Active. Norvasc (5MG Tablet, Oral) Active. Coreg (25MG Tablet, Oral) Active. Lasix (20MG Tablet, Oral) Active. Mobic (15MG Tablet, Oral) Active. Synthroid (100MCG Tablet, Oral) Active. valACYclovir HCl (1GM Tablet, Oral) Active. EPINEPHrine (0.3MG/0.3ML Soln Auto-inj, Injection) Active. Medications Reconciled    Vitals  Sabino Gasser CMA; 03/20/2019 10:59 AM) 03/20/2019 10:58 AM Weight: 265.25 lb Height: 66in Body Surface Area: 2.26 m Body Mass Index: 42.81 kg/m  Temp.: 52F(Oral)  Pulse: 100 (Regular)  BP: 130/82 (Sitting, Left Arm, Standard)        Physical Exam (Leylany Nored A. Colton Tassin MD; 03/20/2019 11:12 AM)  General Mental Status-Alert. General Appearance-Consistent with stated age. Hydration-Well hydrated. Voice-Normal.  Breast Note: Left breast shows some mild erythema around the nipple but no tenderness or fluctuance. Upper side is intact. Left axilla incision intact without seroma. Right breast is normal today.  Neurologic Neurologic evaluation reveals -alert and oriented x 3 with no impairment of recent or remote memory. Mental Status-Normal.    Assessment & Plan (Rupa Lagan A. [place] MD; 03/20/2019 11:12 AM)  HISTORY OF BREAST CANCER (Z85.3) Impression: Set up for plate removal into September early October once chemotherapy is complete. Overall, she is stable.   PORT-A-CATH IN PLACE [phone]) Impression: set up for removal end of september /october  Current Plans I recommended surgery to remove the catheter. I explained the technique of removal with use of local anesthesia & possible need for more aggressive sedation/anesthesia for patient comfort.  Risks such as bleeding, infection, and other risks were discussed. Post-operative dressing/incision care was discussed. I noted a good likelihood this will help address the problem. We will work to minimize complications. Questions were answered. The patient expresses understanding & wishes to proceed with surgery.  Pt Education - [facility] Free Text Education/Instructions: discussed with patient and provided information.

## 2019-03-21 ENCOUNTER — Telehealth: Payer: Self-pay | Admitting: Hematology and Oncology

## 2019-03-21 NOTE — Telephone Encounter (Signed)
Scheduled appt per 8/03 sch message - pt to get an updated schedule next visit /

## 2019-03-23 ENCOUNTER — Inpatient Hospital Stay: Payer: BC Managed Care – PPO

## 2019-03-23 ENCOUNTER — Other Ambulatory Visit: Payer: Self-pay

## 2019-03-23 ENCOUNTER — Inpatient Hospital Stay: Payer: BC Managed Care – PPO | Attending: Hematology and Oncology

## 2019-03-23 VITALS — BP 131/75 | HR 75 | Temp 98.4°F | Resp 16 | Wt 265.2 lb

## 2019-03-23 DIAGNOSIS — Z79899 Other long term (current) drug therapy: Secondary | ICD-10-CM | POA: Insufficient documentation

## 2019-03-23 DIAGNOSIS — C50412 Malignant neoplasm of upper-outer quadrant of left female breast: Secondary | ICD-10-CM | POA: Diagnosis not present

## 2019-03-23 DIAGNOSIS — T451X5A Adverse effect of antineoplastic and immunosuppressive drugs, initial encounter: Secondary | ICD-10-CM | POA: Insufficient documentation

## 2019-03-23 DIAGNOSIS — Z791 Long term (current) use of non-steroidal anti-inflammatories (NSAID): Secondary | ICD-10-CM | POA: Diagnosis not present

## 2019-03-23 DIAGNOSIS — Z170 Estrogen receptor positive status [ER+]: Secondary | ICD-10-CM | POA: Diagnosis not present

## 2019-03-23 DIAGNOSIS — G620 Drug-induced polyneuropathy: Secondary | ICD-10-CM | POA: Insufficient documentation

## 2019-03-23 DIAGNOSIS — Z5111 Encounter for antineoplastic chemotherapy: Secondary | ICD-10-CM | POA: Insufficient documentation

## 2019-03-23 DIAGNOSIS — D6481 Anemia due to antineoplastic chemotherapy: Secondary | ICD-10-CM | POA: Insufficient documentation

## 2019-03-23 DIAGNOSIS — Z95828 Presence of other vascular implants and grafts: Secondary | ICD-10-CM

## 2019-03-23 DIAGNOSIS — Z803 Family history of malignant neoplasm of breast: Secondary | ICD-10-CM | POA: Diagnosis not present

## 2019-03-23 LAB — CBC WITH DIFFERENTIAL (CANCER CENTER ONLY)
Abs Immature Granulocytes: 0.06 10*3/uL (ref 0.00–0.07)
Basophils Absolute: 0 10*3/uL (ref 0.0–0.1)
Basophils Relative: 1 %
Eosinophils Absolute: 0.1 10*3/uL (ref 0.0–0.5)
Eosinophils Relative: 2 %
HCT: 33.7 % — ABNORMAL LOW (ref 36.0–46.0)
Hemoglobin: 10.8 g/dL — ABNORMAL LOW (ref 12.0–15.0)
Immature Granulocytes: 1 %
Lymphocytes Relative: 15 %
Lymphs Abs: 1 10*3/uL (ref 0.7–4.0)
MCH: 30 pg (ref 26.0–34.0)
MCHC: 32 g/dL (ref 30.0–36.0)
MCV: 93.6 fL (ref 80.0–100.0)
Monocytes Absolute: 0.4 10*3/uL (ref 0.1–1.0)
Monocytes Relative: 6 %
Neutro Abs: 5 10*3/uL (ref 1.7–7.7)
Neutrophils Relative %: 75 %
Platelet Count: 243 10*3/uL (ref 150–400)
RBC: 3.6 MIL/uL — ABNORMAL LOW (ref 3.87–5.11)
RDW: 18.6 % — ABNORMAL HIGH (ref 11.5–15.5)
WBC Count: 6.6 10*3/uL (ref 4.0–10.5)
nRBC: 0 % (ref 0.0–0.2)

## 2019-03-23 LAB — CMP (CANCER CENTER ONLY)
ALT: 20 U/L (ref 0–44)
AST: 17 U/L (ref 15–41)
Albumin: 3.5 g/dL (ref 3.5–5.0)
Alkaline Phosphatase: 65 U/L (ref 38–126)
Anion gap: 12 (ref 5–15)
BUN: 13 mg/dL (ref 8–23)
CO2: 22 mmol/L (ref 22–32)
Calcium: 9 mg/dL (ref 8.9–10.3)
Chloride: 107 mmol/L (ref 98–111)
Creatinine: 0.79 mg/dL (ref 0.44–1.00)
GFR, Est AFR Am: >60 mL/min (ref >60)
GFR, Estimated: >60 mL/min (ref >60)
Glucose, Bld: 124 mg/dL — ABNORMAL HIGH (ref 70–99)
Potassium: 4.2 mmol/L (ref 3.5–5.1)
Sodium: 141 mmol/L (ref 135–145)
Total Bilirubin: 0.7 mg/dL (ref 0.3–1.2)
Total Protein: 6.3 g/dL — ABNORMAL LOW (ref 6.5–8.1)

## 2019-03-23 MED ORDER — SODIUM CHLORIDE 0.9% FLUSH
10.0000 mL | INTRAVENOUS | Status: DC | PRN
  Administered 2019-03-23: 09:00:00 10 mL
  Filled 2019-03-23: qty 10

## 2019-03-23 MED ORDER — DIPHENHYDRAMINE HCL 50 MG/ML IJ SOLN
50.0000 mg | Freq: Once | INTRAMUSCULAR | Status: AC
  Administered 2019-03-23: 50 mg via INTRAVENOUS

## 2019-03-23 MED ORDER — SODIUM CHLORIDE 0.9% FLUSH
10.0000 mL | INTRAVENOUS | Status: DC | PRN
  Administered 2019-03-23: 12:00:00 10 mL
  Filled 2019-03-23: qty 10

## 2019-03-23 MED ORDER — SODIUM CHLORIDE 0.9 % IV SOLN
20.0000 mg | Freq: Once | INTRAVENOUS | Status: AC
  Administered 2019-03-23: 20 mg via INTRAVENOUS
  Filled 2019-03-23: qty 20

## 2019-03-23 MED ORDER — DIPHENHYDRAMINE HCL 50 MG/ML IJ SOLN
INTRAMUSCULAR | Status: AC
  Filled 2019-03-23: qty 1

## 2019-03-23 MED ORDER — SODIUM CHLORIDE 0.9 % IV SOLN
80.0000 mg/m2 | Freq: Once | INTRAVENOUS | Status: AC
  Administered 2019-03-23: 192 mg via INTRAVENOUS
  Filled 2019-03-23: qty 32

## 2019-03-23 MED ORDER — SODIUM CHLORIDE 0.9 % IV SOLN
Freq: Once | INTRAVENOUS | Status: AC
  Administered 2019-03-23: 10:00:00 via INTRAVENOUS
  Filled 2019-03-23: qty 250

## 2019-03-23 MED ORDER — HEPARIN SOD (PORK) LOCK FLUSH 100 UNIT/ML IV SOLN
500.0000 [IU] | Freq: Once | INTRAVENOUS | Status: AC | PRN
  Administered 2019-03-23: 500 [IU]
  Filled 2019-03-23: qty 5

## 2019-03-23 MED ORDER — FAMOTIDINE IN NACL 20-0.9 MG/50ML-% IV SOLN
20.0000 mg | Freq: Once | INTRAVENOUS | Status: AC
  Administered 2019-03-23: 10:00:00 20 mg via INTRAVENOUS

## 2019-03-23 MED ORDER — FAMOTIDINE IN NACL 20-0.9 MG/50ML-% IV SOLN
INTRAVENOUS | Status: AC
  Filled 2019-03-23: qty 50

## 2019-03-23 NOTE — Patient Instructions (Signed)
[facility] Discharge Instructions for Patients Receiving Chemotherapy  Today you received the following chemotherapy agent: Taxol  To help prevent nausea and vomiting after your treatment, we encourage you to take your nausea medication as directed by your MD.   If you develop nausea and vomiting that is not controlled by your nausea medication, call the clinic.   BELOW ARE SYMPTOMS THAT SHOULD BE REPORTED IMMEDIATELY:  *FEVER GREATER THAN 100.5 F  *CHILLS WITH OR WITHOUT FEVER  NAUSEA AND VOMITING THAT IS NOT CONTROLLED WITH YOUR NAUSEA MEDICATION  *UNUSUAL SHORTNESS OF BREATH  *UNUSUAL BRUISING OR BLEEDING  TENDERNESS IN MOUTH AND THROAT WITH OR WITHOUT PRESENCE OF ULCERS  *URINARY PROBLEMS  *BOWEL PROBLEMS  UNUSUAL RASH Items with * indicate a potential emergency and should be followed up as soon as possible.  Feel free to call the clinic should you have any questions or concerns. The clinic phone number is [phone].  Please show the CHEMO ALERT CARD at check-in to the Emergency Department and triage nurse. Coronavirus (COVID-19) Are you at risk?  Are you at risk for the Coronavirus (COVID-19)?  To be considered HIGH RISK for Coronavirus (COVID-19), you have to meet the following criteria:  . Traveled to [place], [place], [place], [place] or [place]; or in the [place] to [place], [place], [place], or [place]; and have fever, cough, and shortness of breath within the last 2 weeks of travel OR . Been in close contact with a person diagnosed with COVID-19 within the last 2 weeks and have fever, cough, and shortness of breath . IF YOU DO NOT MEET THESE CRITERIA, YOU ARE CONSIDERED LOW RISK FOR COVID-19.  What to do if you are HIGH RISK for COVID-19?  . If you are having a medical emergency, call 911. . Seek medical care right away. Before you go to a doctor's office, urgent care or emergency department, call ahead and tell them about your  recent travel, contact with someone diagnosed with COVID-19, and your symptoms. You should receive instructions from your physician's office regarding next steps of care.  . When you arrive at healthcare provider, tell the healthcare staff immediately you have returned from visiting [place], [place], [place], [place] or [place]; or traveled in the [place] to [place], [place], [place], or [place]; in the last two weeks or you have been in close contact with a person diagnosed with COVID-19 in the last 2 weeks.   . Tell the health care staff about your symptoms: fever, cough and shortness of breath. . After you have been seen by a medical provider, you will be either: o Tested for (COVID-19) and discharged home on quarantine except to seek medical care if symptoms worsen, and asked to  - Stay home and avoid contact with others until you get your results (4-5 days)  - Avoid travel on public transportation if possible (such as bus, train, or airplane) or o Sent to the Emergency Department by EMS for evaluation, COVID-19 testing, and possible admission depending on your condition and test results.  What to do if you are LOW RISK for COVID-19?  Reduce your risk of any infection by using the same precautions used for avoiding the common cold or flu:  . Wash your hands often with soap and warm water for at least 20 seconds.  If soap and water are not readily available, use an alcohol-based hand sanitizer with at least 60% alcohol.  . If coughing   or sneezing, cover your mouth and nose by coughing or sneezing into the elbow areas of your shirt or coat, into a tissue or into your sleeve (not your hands). . Avoid shaking hands with others and consider head nods or verbal greetings only. . Avoid touching your eyes, nose, or mouth with unwashed hands.  . Avoid close contact with people who are sick. . Avoid places or events with large numbers of people in one location, like concerts or sporting  events. . Carefully consider travel plans you have or are making. . If you are planning any travel outside or inside the [place], visit the CDC's Travelers' Health webpage for the latest health notices. . If you have some symptoms but not all symptoms, continue to monitor at home and seek medical attention if your symptoms worsen. . If you are having a medical emergency, call 911.   ADDITIONAL HEALTHCARE OPTIONS FOR PATIENTS  [facility] / e-Visit: [email or web address]         [facility]: [phone]  [facility]: [phone]                   [facility]: [phone]    

## 2019-03-23 NOTE — Patient Instructions (Signed)

## 2019-03-23 NOTE — Assessment & Plan Note (Signed)
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 Adj chemo with DD Adriamycin and Cytoxan followed by Taxol weekly X 12 2. Adj XRT 3. Adj Anti estrogen therapy --------------------------------------------------------------------------------------------- Current treatment:Completed 4 cycles ofdose dense Adriamycin and Cytoxan, today is cycle8Taxol Chemo toxicities:Nausea has resolved. Labs reviewed 1.Chemo induced anemia: Being monitored, today's hemoglobin 10.7 2.  Monitoring closely for peripheral neuropathy.  She felt mild numbness in the morning but then went away.  If it is persistent we may have to reduce the dosage of her chemo. Encouraged her to eat more protein  Return to clinicweekly for Taxol every other week for follow-up with me.

## 2019-03-29 NOTE — Progress Notes (Signed)
Patient Care Team: Carollee Herter, Alferd Apa, DO as PCP - General Dorna Leitz, MD as Consulting Physician (Orthopedic Surgery) Erroll Luna, MD as Consulting Physician (General Surgery) Nicholas Lose, MD as Consulting Physician (Hematology and Oncology) Kyung Rudd, MD as Consulting Physician (Radiation Oncology) Rockwell Germany, RN as Oncology Nurse Navigator Mauro Kaufmann, RN as Oncology Nurse Navigator  DIAGNOSIS:    ICD-10-CM   1. Malignant neoplasm of upper-outer quadrant of left breast in female, estrogen receptor positive ([facility])  C50.412    Z17.0     SUMMARY OF ONCOLOGIC HISTORY: Oncology History  Malignant neoplasm of upper-outer quadrant of left breast in female, estrogen receptor positive ([facility])  08/24/2018 Initial Biopsy   Screening detected left breast mass at 2:30 position 6 mm in size with one abnormal lymph node and stable benign calcifications, biopsy of the mass grade 2 IDC with DCIS and lymphovascular invasion, lymph node biopsy is positive, ER 95%, PR 40%, Ki-67 20%, HER-2 -1+ by IHC, T1b N1 stage Ib   08/31/2018 Cancer Staging   Staging form: Breast, AJCC 8th Edition - Clinical: Stage IB (cT1b, cN1(f), cM0, G2, ER+, PR+, HER2-) - Signed by Nicholas Lose, MD on 08/31/2018   10/11/2018 Surgery   Left lumpectomy: Grade 2 IDC with DCIS, 1 cm, margins negative, DCIS focally 0.1 cm from superior margin, 2/2 lymph nodes positive, ER 95%, PR 45%, HER-2 negative, Ki-67 20% AXLND: 3/20 LN Pos (total 5/22)   10/18/2018 Cancer Staging   Staging form: Breast, AJCC 8th Edition - Pathologic stage from 10/18/2018: Stage IB (pT1b, pN2a, cM0, G2, ER+, PR+, HER2-) - Signed by Nicholas Lose, MD on 11/14/2018   12/15/2018 -  Chemotherapy   The patient had DOXOrubicin (ADRIAMYCIN) chemo injection 142 mg, 60 mg/m2 = 142 mg, Intravenous,  Once, 4 of 4 cycles Administration: 142 mg (12/15/2018), 142 mg (12/29/2018), 142 mg (01/12/2019), 142 mg (01/26/2019) palonosetron (ALOXI) injection 0.25 mg,  0.25 mg, Intravenous,  Once, 4 of 4 cycles Administration: 0.25 mg (12/15/2018), 0.25 mg (12/29/2018), 0.25 mg (01/12/2019), 0.25 mg (01/26/2019) pegfilgrastim-cbqv (UDENYCA) injection 6 mg, 6 mg, Subcutaneous, Once, 4 of 4 cycles Administration: 6 mg (12/17/2018), 6 mg (12/31/2018), 6 mg (01/14/2019), 6 mg (01/28/2019) cyclophosphamide (CYTOXAN) 1,420 mg in sodium chloride 0.9 % 250 mL chemo infusion, 600 mg/m2 = 1,420 mg, Intravenous,  Once, 4 of 4 cycles Administration: 1,420 mg (12/15/2018), 1,420 mg (12/29/2018), 1,420 mg (01/12/2019), 1,420 mg (01/26/2019) PACLitaxel (TAXOL) 192 mg in sodium chloride 0.9 % 250 mL chemo infusion (</= 92m/m2), 80 mg/m2 = 192 mg, Intravenous,  Once, 7 of 12 cycles Administration: 192 mg (02/09/2019), 192 mg (02/16/2019), 192 mg (02/23/2019), 192 mg (03/02/2019), 192 mg (03/09/2019), 192 mg (03/16/2019), 192 mg (03/23/2019) fosaprepitant (EMEND) 150 mg, dexamethasone (DECADRON) 12 mg in sodium chloride 0.9 % 145 mL IVPB, , Intravenous,  Once, 4 of 4 cycles Administration:  (12/15/2018),  (12/29/2018),  (01/12/2019),  (01/26/2019)  for chemotherapy treatment.      CHIEF COMPLIANT: Cycle 8 Taxol  INTERVAL HISTORY: PBerlinda Farveis a 63y.o. with above-mentioned history of left breast cancer who underwent a lumpectomy followed by an axillary lymph node dissection and iscurrently onadjuvant chemotherapy. She completed 4 cycles ofdose dense Adriamycin and Cytoxanand is currently on weekly Taxol treatments. She presents to the clinic todayforcycle 8.  REVIEW OF SYSTEMS:   Constitutional: Denies fevers, chills or abnormal weight loss Eyes: Denies blurriness of vision Ears, nose, mouth, throat, and face: Denies mucositis or sore throat Respiratory: Denies cough, dyspnea  or wheezes Cardiovascular: Denies palpitation, chest discomfort Gastrointestinal: Denies nausea, heartburn or change in bowel habits Skin: Denies abnormal skin rashes Lymphatics: Denies new lymphadenopathy or easy  bruising Neurological: Denies numbness, tingling or new weaknesses Behavioral/Psych: Mood is stable, no new changes  Extremities: No lower extremity edema Breast: denies any pain or lumps or nodules in either breasts All other systems were reviewed with the patient and are negative.  I have reviewed the past medical history, past surgical history, social history and family history with the patient and they are unchanged from previous note.  ALLERGIES:  is allergic to adhesive [tape]; erythromycin; lisinopril; and penicillins.  MEDICATIONS:  Current Outpatient Medications  Medication Sig Dispense Refill  . amLODipine (NORVASC) 5 MG tablet Take 1 tablet (5 mg total) by mouth daily. 90 tablet 1  . carvedilol (COREG) 25 MG tablet TAKE 1 TABLET(25 MG) BY MOUTH TWICE DAILY WITH A MEAL 180 tablet 1  . EPINEPHRINE 0.3 mg/0.3 mL IJ SOAJ injection INJECT 0.3 ML(1 SYRINGE) IN THE MUSCLE AS NEEDED FOR ALLERGIC REACTION 2 Device 0  . furosemide (LASIX) 20 MG tablet TAKE 1 TABLET(20 MG) BY MOUTH DAILY 90 tablet 1  . lidocaine-prilocaine (EMLA) cream Apply to affected area once 30 g 3  . LORazepam (ATIVAN) 0.5 MG tablet TAKE 1 TABLET(0.5 MG) BY MOUTH AT BEDTIME AS NEEDED FOR SLEEP 30 tablet 1  . meloxicam (MOBIC) 15 MG tablet TAKE 1 TABLET(15 MG) BY MOUTH DAILY AS NEEDED FOR PAIN 90 tablet 3  . ondansetron (ZOFRAN) 8 MG tablet Take 1 tablet (8 mg total) by mouth 2 (two) times daily as needed. Start on the third day after chemotherapy. 30 tablet 1  . prochlorperazine (COMPAZINE) 10 MG tablet TAKE 1 TABLET(10 MG) BY MOUTH EVERY 6 HOURS AS NEEDED FOR NAUSEA OR VOMITING 30 tablet 1  . SYNTHROID 100 MCG tablet 1 po qd 90 tablet 3  . valACYclovir (VALTREX) 1000 MG tablet TAKE 1 TABLET BY MOUTH THREE TIMES DAILY, DISCONTINUE ALL PREVIOUS REFILLS FOR THIS MEDICATION 30 tablet 5   No current facility-administered medications for this visit.     PHYSICAL EXAMINATION: ECOG PERFORMANCE STATUS: 1 - Symptomatic but  completely ambulatory  Vitals:   03/30/19 0857  BP: 127/71  Pulse: 78  Resp: 18  Temp: 98.9 F (37.2 C)  SpO2: 100%   Filed Weights   03/30/19 0857  Weight: 263 lb 1.6 oz (119.3 kg)    GENERAL: alert, no distress and comfortable SKIN: skin color, texture, turgor are normal, no rashes or significant lesions EYES: normal, Conjunctiva are pink and non-injected, sclera clear OROPHARYNX: no exudate, no erythema and lips, buccal mucosa, and tongue normal  NECK: supple, thyroid normal size, non-tender, without nodularity LYMPH: no palpable lymphadenopathy in the cervical, axillary or inguinal LUNGS: clear to auscultation and percussion with normal breathing effort HEART: regular rate & rhythm and no murmurs and no lower extremity edema ABDOMEN: abdomen soft, non-tender and normal bowel sounds MUSCULOSKELETAL: no cyanosis of digits and no clubbing  NEURO: alert & oriented x 3 with fluent speech, no focal motor/sensory deficits EXTREMITIES: No lower extremity edema  LABORATORY DATA:  I have reviewed the data as listed CMP Latest Ref Rng & Units 03/23/2019 03/16/2019 03/09/2019  Glucose 70 - 99 mg/dL 124(H) 105(H) 97  BUN 8 - 23 mg/dL 13 11 17   Creatinine 0.44 - 1.00 mg/dL 0.79 0.74 0.70  Sodium 135 - 145 mmol/L 141 142 142  Potassium 3.5 - 5.1 mmol/L 4.2 4.1 3.8  Chloride 98 - 111 mmol/L 107 108 109  CO2 22 - 32 mmol/L 22 26 24   Calcium 8.9 - 10.3 mg/dL 9.0 9.0 8.6(L)  Total Protein 6.5 - 8.1 g/dL 6.3(L) 6.4(L) 6.3(L)  Total Bilirubin 0.3 - 1.2 mg/dL 0.7 0.7 0.6  Alkaline Phos 38 - 126 U/L 65 63 68  AST 15 - 41 U/L 17 17 16   ALT 0 - 44 U/L 20 21 22     Lab Results  Component Value Date   WBC 6.6 03/23/2019   HGB 10.8 (L) 03/23/2019   HCT 33.7 (L) 03/23/2019   MCV 93.6 03/23/2019   PLT 243 03/23/2019   NEUTROABS 5.0 03/23/2019    ASSESSMENT & PLAN:  Malignant neoplasm of upper-outer quadrant of left breast in female, estrogen receptor positive ([facility]) Left lumpectomy: Grade  2 IDC with DCIS, 1 cm, margins negative, DCIS focally 0.1 cm from superior margin, 2/2 lymph nodes positive, ER 95%, PR 45%, HER-2 negative, Ki-67 20% AXLND: 3/20 LN Pos (total 5/22)  Treatment Plan: 1. Adj chemo with DD Adriamycin and Cytoxan followed by Taxol weekly X 12 2. Adj XRT 3. Adj Anti estrogen therapy --------------------------------------------------------------------------------------------- Current treatment:Completed 4 cycles ofdose dense Adriamycin and Cytoxan, today is cycle8Taxol Chemo toxicities:Nausea has resolved. Labs reviewed 1.Chemo induced anemia: Being monitored, today's hemoglobin 10.7 2.  Monitoring closely for peripheral neuropathy.    I reduce the dosage of her chemotherapy with cycle 8.  I suspect that she will not be able to tolerate beyond 10 cycles of chemo.  We will watch this very closely. Encouraged her to eat more protein  Return to clinicweekly for Taxol every other week for follow-up with me.  No orders of the defined types were placed in this encounter.  The patient has a good understanding of the overall plan. she agrees with it. she will call with any problems that may develop before the next visit here.  Nicholas Lose, MD 03/30/2019  Julious Oka Dorshimer am acting as scribe for Dr. Nicholas Lose.  I have reviewed the above documentation for accuracy and completeness, and I agree with the above.

## 2019-03-30 ENCOUNTER — Inpatient Hospital Stay: Payer: BC Managed Care – PPO

## 2019-03-30 ENCOUNTER — Other Ambulatory Visit: Payer: Self-pay

## 2019-03-30 ENCOUNTER — Inpatient Hospital Stay: Payer: BC Managed Care – PPO | Admitting: Hematology and Oncology

## 2019-03-30 DIAGNOSIS — Z170 Estrogen receptor positive status [ER+]: Secondary | ICD-10-CM | POA: Diagnosis not present

## 2019-03-30 DIAGNOSIS — G620 Drug-induced polyneuropathy: Secondary | ICD-10-CM | POA: Insufficient documentation

## 2019-03-30 DIAGNOSIS — Z95828 Presence of other vascular implants and grafts: Secondary | ICD-10-CM

## 2019-03-30 DIAGNOSIS — C50412 Malignant neoplasm of upper-outer quadrant of left female breast: Secondary | ICD-10-CM

## 2019-03-30 DIAGNOSIS — T451X5A Adverse effect of antineoplastic and immunosuppressive drugs, initial encounter: Secondary | ICD-10-CM

## 2019-03-30 LAB — CBC WITH DIFFERENTIAL (CANCER CENTER ONLY)
Abs Immature Granulocytes: 0.07 10*3/uL (ref 0.00–0.07)
Basophils Absolute: 0 10*3/uL (ref 0.0–0.1)
Basophils Relative: 1 %
Eosinophils Absolute: 0.1 10*3/uL (ref 0.0–0.5)
Eosinophils Relative: 2 %
HCT: 33 % — ABNORMAL LOW (ref 36.0–46.0)
Hemoglobin: 10.8 g/dL — ABNORMAL LOW (ref 12.0–15.0)
Immature Granulocytes: 1 %
Lymphocytes Relative: 15 %
Lymphs Abs: 1.1 10*3/uL (ref 0.7–4.0)
MCH: 30.9 pg (ref 26.0–34.0)
MCHC: 32.7 g/dL (ref 30.0–36.0)
MCV: 94.3 fL (ref 80.0–100.0)
Monocytes Absolute: 0.5 10*3/uL (ref 0.1–1.0)
Monocytes Relative: 6 %
Neutro Abs: 5.4 10*3/uL (ref 1.7–7.7)
Neutrophils Relative %: 75 %
Platelet Count: 239 10*3/uL (ref 150–400)
RBC: 3.5 MIL/uL — ABNORMAL LOW (ref 3.87–5.11)
RDW: 17.6 % — ABNORMAL HIGH (ref 11.5–15.5)
WBC Count: 7.2 10*3/uL (ref 4.0–10.5)
nRBC: 0 % (ref 0.0–0.2)

## 2019-03-30 LAB — CMP (CANCER CENTER ONLY)
ALT: 22 U/L (ref 0–44)
AST: 18 U/L (ref 15–41)
Albumin: 3.3 g/dL — ABNORMAL LOW (ref 3.5–5.0)
Alkaline Phosphatase: 63 U/L (ref 38–126)
Anion gap: 10 (ref 5–15)
BUN: 15 mg/dL (ref 8–23)
CO2: 24 mmol/L (ref 22–32)
Calcium: 9.1 mg/dL (ref 8.9–10.3)
Chloride: 106 mmol/L (ref 98–111)
Creatinine: 0.74 mg/dL (ref 0.44–1.00)
GFR, Est AFR Am: >60 mL/min (ref >60)
GFR, Estimated: >60 mL/min (ref >60)
Glucose, Bld: 98 mg/dL (ref 70–99)
Potassium: 4 mmol/L (ref 3.5–5.1)
Sodium: 140 mmol/L (ref 135–145)
Total Bilirubin: 0.8 mg/dL (ref 0.3–1.2)
Total Protein: 6.2 g/dL — ABNORMAL LOW (ref 6.5–8.1)

## 2019-03-30 MED ORDER — DIPHENHYDRAMINE HCL 50 MG/ML IJ SOLN
50.0000 mg | Freq: Once | INTRAMUSCULAR | Status: AC
  Administered 2019-03-30: 09:00:00 50 mg via INTRAVENOUS

## 2019-03-30 MED ORDER — FAMOTIDINE IN NACL 20-0.9 MG/50ML-% IV SOLN
20.0000 mg | Freq: Once | INTRAVENOUS | Status: AC
  Administered 2019-03-30: 09:00:00 20 mg via INTRAVENOUS

## 2019-03-30 MED ORDER — HEPARIN SOD (PORK) LOCK FLUSH 100 UNIT/ML IV SOLN
500.0000 [IU] | Freq: Once | INTRAVENOUS | Status: AC | PRN
  Administered 2019-03-30: 500 [IU]
  Filled 2019-03-30: qty 5

## 2019-03-30 MED ORDER — SODIUM CHLORIDE 0.9% FLUSH
10.0000 mL | INTRAVENOUS | Status: DC | PRN
  Administered 2019-03-30: 10 mL
  Filled 2019-03-30: qty 10

## 2019-03-30 MED ORDER — FAMOTIDINE IN NACL 20-0.9 MG/50ML-% IV SOLN
INTRAVENOUS | Status: AC
  Filled 2019-03-30: qty 50

## 2019-03-30 MED ORDER — SODIUM CHLORIDE 0.9 % IV SOLN
Freq: Once | INTRAVENOUS | Status: AC
  Administered 2019-03-30: 09:00:00 via INTRAVENOUS
  Filled 2019-03-30: qty 250

## 2019-03-30 MED ORDER — SODIUM CHLORIDE 0.9 % IV SOLN
60.0000 mg/m2 | Freq: Once | INTRAVENOUS | Status: AC
  Administered 2019-03-30: 11:00:00 144 mg via INTRAVENOUS
  Filled 2019-03-30: qty 24

## 2019-03-30 MED ORDER — SODIUM CHLORIDE 0.9 % IV SOLN
20.0000 mg | Freq: Once | INTRAVENOUS | Status: AC
  Administered 2019-03-30: 10:00:00 20 mg via INTRAVENOUS
  Filled 2019-03-30: qty 20

## 2019-03-30 MED ORDER — DIPHENHYDRAMINE HCL 50 MG/ML IJ SOLN
INTRAMUSCULAR | Status: AC
  Filled 2019-03-30: qty 1

## 2019-03-30 MED ORDER — SODIUM CHLORIDE 0.9% FLUSH
10.0000 mL | INTRAVENOUS | Status: DC | PRN
  Administered 2019-03-30: 12:00:00 10 mL
  Filled 2019-03-30: qty 10

## 2019-03-30 NOTE — Patient Instructions (Signed)

## 2019-04-03 ENCOUNTER — Telehealth: Payer: Self-pay

## 2019-04-03 NOTE — Telephone Encounter (Signed)
RN returned call to patient.  Pt reports she noticed drainage from her right great toenail over the weekend.  Pt reports small amount of blood.  Denies any trauma to great toe.  No drainage noted today.    Pt denies any swelling, redness, or foul odors from toe.  RN encouraged epsom salt soaks, and dry loose bandage when wearing shoes. Pt voiced understanding, will notify if any changes.

## 2019-04-06 ENCOUNTER — Encounter: Payer: Self-pay | Admitting: *Deleted

## 2019-04-06 ENCOUNTER — Other Ambulatory Visit: Payer: Self-pay

## 2019-04-06 ENCOUNTER — Other Ambulatory Visit: Payer: Self-pay | Admitting: Medical

## 2019-04-06 ENCOUNTER — Inpatient Hospital Stay: Payer: BC Managed Care – PPO

## 2019-04-06 ENCOUNTER — Inpatient Hospital Stay (HOSPITAL_BASED_OUTPATIENT_CLINIC_OR_DEPARTMENT_OTHER): Payer: BC Managed Care – PPO | Admitting: Medical

## 2019-04-06 VITALS — BP 124/70 | HR 77 | Temp 98.9°F | Resp 18 | Wt 260.5 lb

## 2019-04-06 DIAGNOSIS — Z170 Estrogen receptor positive status [ER+]: Secondary | ICD-10-CM

## 2019-04-06 DIAGNOSIS — G620 Drug-induced polyneuropathy: Secondary | ICD-10-CM | POA: Diagnosis not present

## 2019-04-06 DIAGNOSIS — R21 Rash and other nonspecific skin eruption: Secondary | ICD-10-CM | POA: Diagnosis not present

## 2019-04-06 DIAGNOSIS — C50412 Malignant neoplasm of upper-outer quadrant of left female breast: Secondary | ICD-10-CM

## 2019-04-06 DIAGNOSIS — Z95828 Presence of other vascular implants and grafts: Secondary | ICD-10-CM

## 2019-04-06 LAB — CMP (CANCER CENTER ONLY)
ALT: 19 U/L (ref 0–44)
AST: 18 U/L (ref 15–41)
Albumin: 3.4 g/dL — ABNORMAL LOW (ref 3.5–5.0)
Alkaline Phosphatase: 69 U/L (ref 38–126)
Anion gap: 8 (ref 5–15)
BUN: 10 mg/dL (ref 8–23)
CO2: 25 mmol/L (ref 22–32)
Calcium: 8.7 mg/dL — ABNORMAL LOW (ref 8.9–10.3)
Chloride: 107 mmol/L (ref 98–111)
Creatinine: 0.73 mg/dL (ref 0.44–1.00)
GFR, Est AFR Am: >60 mL/min (ref >60)
GFR, Estimated: >60 mL/min (ref >60)
Glucose, Bld: 104 mg/dL — ABNORMAL HIGH (ref 70–99)
Potassium: 3.8 mmol/L (ref 3.5–5.1)
Sodium: 140 mmol/L (ref 135–145)
Total Bilirubin: 1 mg/dL (ref 0.3–1.2)
Total Protein: 6.4 g/dL — ABNORMAL LOW (ref 6.5–8.1)

## 2019-04-06 LAB — CBC WITH DIFFERENTIAL (CANCER CENTER ONLY)
Abs Immature Granulocytes: 0.06 10*3/uL (ref 0.00–0.07)
Basophils Absolute: 0 10*3/uL (ref 0.0–0.1)
Basophils Relative: 1 %
Eosinophils Absolute: 0.1 10*3/uL (ref 0.0–0.5)
Eosinophils Relative: 2 %
HCT: 34.7 % — ABNORMAL LOW (ref 36.0–46.0)
Hemoglobin: 11.3 g/dL — ABNORMAL LOW (ref 12.0–15.0)
Immature Granulocytes: 1 %
Lymphocytes Relative: 16 %
Lymphs Abs: 1.1 10*3/uL (ref 0.7–4.0)
MCH: 31.1 pg (ref 26.0–34.0)
MCHC: 32.6 g/dL (ref 30.0–36.0)
MCV: 95.6 fL (ref 80.0–100.0)
Monocytes Absolute: 0.5 10*3/uL (ref 0.1–1.0)
Monocytes Relative: 7 %
Neutro Abs: 4.9 10*3/uL (ref 1.7–7.7)
Neutrophils Relative %: 73 %
Platelet Count: 269 10*3/uL (ref 150–400)
RBC: 3.63 MIL/uL — ABNORMAL LOW (ref 3.87–5.11)
RDW: 16.5 % — ABNORMAL HIGH (ref 11.5–15.5)
WBC Count: 6.7 10*3/uL (ref 4.0–10.5)
nRBC: 0 % (ref 0.0–0.2)

## 2019-04-06 MED ORDER — METRONIDAZOLE 0.75 % EX GEL: 1.0000 "application " | Freq: Two times a day (BID) | CUTANEOUS | 2 refills | Status: DC

## 2019-04-06 MED ORDER — FAMOTIDINE IN NACL 20-0.9 MG/50ML-% IV SOLN
INTRAVENOUS | Status: AC
  Filled 2019-04-06: qty 50

## 2019-04-06 MED ORDER — SODIUM CHLORIDE 0.9% FLUSH
10.0000 mL | INTRAVENOUS | Status: DC | PRN
  Administered 2019-04-06: 10 mL
  Filled 2019-04-06: qty 10

## 2019-04-06 MED ORDER — SODIUM CHLORIDE 0.9 % IV SOLN
Freq: Once | INTRAVENOUS | Status: AC
  Administered 2019-04-06: 14:00:00 via INTRAVENOUS
  Filled 2019-04-06: qty 250

## 2019-04-06 MED ORDER — SODIUM CHLORIDE 0.9 % IV SOLN
60.0000 mg/m2 | Freq: Once | INTRAVENOUS | Status: AC
  Administered 2019-04-06: 16:00:00 144 mg via INTRAVENOUS
  Filled 2019-04-06: qty 24

## 2019-04-06 MED ORDER — SODIUM CHLORIDE 0.9 % IV SOLN
20.0000 mg | Freq: Once | INTRAVENOUS | Status: AC
  Administered 2019-04-06: 15:00:00 20 mg via INTRAVENOUS
  Filled 2019-04-06: qty 20

## 2019-04-06 MED ORDER — GABAPENTIN 300 MG PO CAPS: 300.0000 mg | ORAL_CAPSULE | Freq: Every day | ORAL | 2 refills | Status: DC

## 2019-04-06 MED ORDER — FAMOTIDINE IN NACL 20-0.9 MG/50ML-% IV SOLN
20.0000 mg | Freq: Once | INTRAVENOUS | Status: AC
  Administered 2019-04-06: 20 mg via INTRAVENOUS

## 2019-04-06 MED ORDER — DIPHENHYDRAMINE HCL 50 MG/ML IJ SOLN
INTRAMUSCULAR | Status: AC
  Filled 2019-04-06: qty 1

## 2019-04-06 MED ORDER — HEPARIN SOD (PORK) LOCK FLUSH 100 UNIT/ML IV SOLN
500.0000 [IU] | Freq: Once | INTRAVENOUS | Status: AC | PRN
  Administered 2019-04-06: 17:00:00 500 [IU]
  Filled 2019-04-06: qty 5

## 2019-04-06 MED ORDER — DIPHENHYDRAMINE HCL 50 MG/ML IJ SOLN
50.0000 mg | Freq: Once | INTRAMUSCULAR | Status: AC
  Administered 2019-04-06: 14:00:00 50 mg via INTRAVENOUS

## 2019-04-06 NOTE — Patient Instructions (Signed)

## 2019-04-06 NOTE — Patient Instructions (Signed)
[facility] Discharge Instructions for Patients Receiving Chemotherapy  Today you received the following chemotherapy agent: Taxol  To help prevent nausea and vomiting after your treatment, we encourage you to take your nausea medication as directed by your MD.   If you develop nausea and vomiting that is not controlled by your nausea medication, call the clinic.   BELOW ARE SYMPTOMS THAT SHOULD BE REPORTED IMMEDIATELY:  *FEVER GREATER THAN 100.5 F  *CHILLS WITH OR WITHOUT FEVER  NAUSEA AND VOMITING THAT IS NOT CONTROLLED WITH YOUR NAUSEA MEDICATION  *UNUSUAL SHORTNESS OF BREATH  *UNUSUAL BRUISING OR BLEEDING  TENDERNESS IN MOUTH AND THROAT WITH OR WITHOUT PRESENCE OF ULCERS  *URINARY PROBLEMS  *BOWEL PROBLEMS  UNUSUAL RASH Items with * indicate a potential emergency and should be followed up as soon as possible.  Feel free to call the clinic should you have any questions or concerns. The clinic phone number is [phone].  Please show the CHEMO ALERT CARD at check-in to the Emergency Department and triage nurse. Coronavirus (COVID-19) Are you at risk?  Are you at risk for the Coronavirus (COVID-19)?  To be considered HIGH RISK for Coronavirus (COVID-19), you have to meet the following criteria:  . Traveled to [place], [place], [place], [place] or [place]; or in the [place] to [place], [place], [place], or [place]; and have fever, cough, and shortness of breath within the last 2 weeks of travel OR . Been in close contact with a person diagnosed with COVID-19 within the last 2 weeks and have fever, cough, and shortness of breath . IF YOU DO NOT MEET THESE CRITERIA, YOU ARE CONSIDERED LOW RISK FOR COVID-19.  What to do if you are HIGH RISK for COVID-19?  . If you are having a medical emergency, call 911. . Seek medical care right away. Before you go to a doctor's office, urgent care or emergency department, call ahead and tell them about your  recent travel, contact with someone diagnosed with COVID-19, and your symptoms. You should receive instructions from your physician's office regarding next steps of care.  . When you arrive at healthcare provider, tell the healthcare staff immediately you have returned from visiting [place], [place], [place], [place] or [place]; or traveled in the [place] to [place], [place], [place], or [place]; in the last two weeks or you have been in close contact with a person diagnosed with COVID-19 in the last 2 weeks.   . Tell the health care staff about your symptoms: fever, cough and shortness of breath. . After you have been seen by a medical provider, you will be either: o Tested for (COVID-19) and discharged home on quarantine except to seek medical care if symptoms worsen, and asked to  - Stay home and avoid contact with others until you get your results (4-5 days)  - Avoid travel on public transportation if possible (such as bus, train, or airplane) or o Sent to the Emergency Department by EMS for evaluation, COVID-19 testing, and possible admission depending on your condition and test results.  What to do if you are LOW RISK for COVID-19?  Reduce your risk of any infection by using the same precautions used for avoiding the common cold or flu:  . Wash your hands often with soap and warm water for at least 20 seconds.  If soap and water are not readily available, use an alcohol-based hand sanitizer with at least 60% alcohol.  . If coughing   or sneezing, cover your mouth and nose by coughing or sneezing into the elbow areas of your shirt or coat, into a tissue or into your sleeve (not your hands). . Avoid shaking hands with others and consider head nods or verbal greetings only. . Avoid touching your eyes, nose, or mouth with unwashed hands.  . Avoid close contact with people who are sick. . Avoid places or events with large numbers of people in one location, like concerts or sporting  events. . Carefully consider travel plans you have or are making. . If you are planning any travel outside or inside the [place], visit the CDC's Travelers' Health webpage for the latest health notices. . If you have some symptoms but not all symptoms, continue to monitor at home and seek medical attention if your symptoms worsen. . If you are having a medical emergency, call 911.   ADDITIONAL HEALTHCARE OPTIONS FOR PATIENTS  [facility] / e-Visit: [email or web address]         [facility]: [phone]  [facility]: [phone]                   [facility]: [phone]    

## 2019-04-06 NOTE — Progress Notes (Signed)
Pt states she started having pain & some bleeding around the nail of her R big toe on Sunday, also slightly swollen, sore & tender.  Spoke with Kathlee Nations RN on Monday, was advised to have PA examine it on day of infusion.  Pt also states she has slightly more tingling in the fingers on her R hand & some numbness in her toes.  Today has noted a rash across her forehead & her cheeks.    1455 - V. Tanner PA here to see pt, prescriptions given.

## 2019-04-06 NOTE — Progress Notes (Signed)
[facility] Progress Note   Kayla Banks [id number] September 22, 1955 63 y.o.  Kayla Banks is managed by Dr. Nicholas Lose  Actively treated with chemotherapy/immunotherapy/hormonal therapy: yes  Current therapy: Paclitaxel  Last treated: 04/06/2019 (cycle 9, day 1)  Next scheduled appointment with provider: 04/14/2019  Assessment: Plan:    Malignant neoplasm of upper-outer quadrant of left breast in female, estrogen receptor positive ([place])  Facial rash  Drug-induced polyneuropathy ([place])   ER positive malignant neoplasm of the left breast: The patient continues to be managed by Dr. Nicholas Lose and is receiving cycle 9, day 1 of paclitaxel today.  She will follow-up with Dr. Lindi Adie on 04/14/2019.  Facial rash: The patient was given a prescription for metronidazole gel to use twice daily.  Peripheral neuropathy: The patient was given a prescription for gabapentin 300 mg p.o. nightly.  Hyperpigmented nails: The patient was told that this was secondary to paclitaxel.  She was told to let her nails, off on their own if they start to avulse.  Please see After Visit Summary for patient specific instructions.  Future Appointments  Date Time Provider [place]  04/14/2019  9:30 AM [facility] LAB 3 [facility] None  04/14/2019  9:45 AM [facility] [place] FLUSH [facility] None  04/14/2019 10:15 AM Nicholas Lose, MD [facility] None  04/14/2019 11:00 AM [facility] INFUSION [facility] None  04/20/2019 12:45 PM [facility] LAB 6 [facility] None  04/20/2019  1:00 PM [facility] [place] FLUSH [facility] None  04/20/2019  2:00 PM [facility] INFUSION [facility] None  04/27/2019  1:00 PM [facility] LAB 5 [facility] None  04/27/2019  1:15 PM [facility] [place] FLUSH [facility] None  04/27/2019  1:45 PM Nicholas Lose, MD [facility] None  04/27/2019  2:30 PM [facility] INFUSION [facility] None    No orders of the defined types were placed in this encounter.      Subjective:    Patient ID:  Kayla Banks is a 63 y.o. (DOB [birth year]) female.  Chief Complaint: No chief complaint on file.   HPI Kayla Banks is a 63 year old female with a diagnosis of an ER positive malignant neoplasm of the left breast.  She is managed by Dr. Nicholas Lose and is receiving cycle 9, day 1 of paclitaxel today.  She reports having a diffuse facial rash over her forehead and cheeks and bridge of her nose which is erythematous with multiple small pustules.  She also reports having tingling in her fingers and toes.  She is also concerned that her toenails are hyperpigmented and that the toenail on her right great toe is coming off.  Medications: I have reviewed the patient's current medications.  Allergies:  Allergies  Allergen Reactions  . Adhesive [Tape]   . Erythromycin Swelling    Lip swelling; angioedema  . Lisinopril Swelling    REACTION: angioedema  . Penicillins Hives and Swelling    Swelling in arms & hands    Past Medical History:  Diagnosis Date  . Complication of anesthesia   . Family history of breast cancer   . Heart murmur    no problems, saw cardiologist 2010  . Hypertension    on meds  . Hypothyroidism   . Osteoarthritis   . Plantar fasciitis   . PONV (postoperative nausea and vomiting)   . Thyroid disease    Hypothyroidism    Past Surgical History:  Procedure Laterality Date  . ABDOMINAL HYSTERECTOMY  02/2012  . AXILLARY LYMPH NODE DISSECTION Left 11/09/2018   Procedure: LEFT AXILLARY LYMPH NODE DISSECTION;  Surgeon:  Erroll Luna, MD;  Location: [facility];  Service: General;  Laterality: Left;  . BREAST LUMPECTOMY WITH RADIOACTIVE SEED AND SENTINEL LYMPH NODE BIOPSY Left 10/11/2018   Procedure: LEFT BREAST LUMPECTOMY WITH RADIOACTIVE SEED AND LEFT SENTINEL LYMPH NODE MAPPING WITH LEFT TARGETED LYMPH NODE BIOPSY;  Surgeon: Erroll Luna, MD;  Location: [facility];  Service: General;  Laterality: Left;  . FOOT  SURGERY Left    bone spurs  . LUMBAR LAMINECTOMY  1990  . PORTACATH PLACEMENT Right 12/01/2018   Procedure: INSERTION PORT-A-CATH WITH ULTRASOUND;  Surgeon: Erroll Luna, MD;  Location: [facility];  Service: General;  Laterality: Right;    Family History  Problem Relation Age of Onset  . Diabetes Mother   . Hyperlipidemia Mother   . Hypertension Mother   . Hypertension Father   . Heart disease Father 59       ?MI  . Hypothyroidism Sister   . Hyperthyroidism Sister   . Breast cancer Maternal Aunt        dx 60's/70's  . Breast cancer Other        dx early 62's    Social History   Socioeconomic History  . Marital status: Single    Spouse name: Not on file  . Number of children: Not on file  . Years of education: Not on file  . Highest education level: Not on file  Occupational History  . 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 Financial resource strain: Not on file  . Food insecurity    Worry: Not on file    Inability: Not on file  . Transportation needs    Medical: Not on file    Non-medical: Not on file  Tobacco Use  . Smoking status: Never Smoker  . Smokeless tobacco: Never Used  Substance and Sexual Activity  . Alcohol use: Yes    Comment: occassionally  . Drug use: No  . Sexual activity: Yes    Partners: Male  Lifestyle  . Physical activity    Days per week: Not on file    Minutes per session: Not on file  . Stress: Not on file  Relationships  . Social Herbalist on phone: Not on file    Gets together: Not on file    Attends religious service: Not on file    Active member of club or organization: Not on file    Attends meetings of clubs or organizations: Not on file    Relationship status: Not on file  . Intimate partner violence    Fear of current or ex partner: Not on file    Emotionally abused: Not on file    Physically abused: Not on file    Forced sexual activity: Not on file  Other Topics Concern  .  Not on file  Social History Narrative   Exercise-- no    Past Medical History, Surgical history, Social history, and Family history were reviewed and updated as appropriate.   Please see review of systems for further details on the patient's review from today.   Review of Systems:  Review of Systems  Constitutional: Negative for chills, diaphoresis and fever.  HENT: Negative for trouble swallowing and voice change.   Respiratory: Negative for cough, chest tightness, shortness of breath and wheezing.   Cardiovascular: Negative for chest pain and palpitations.  Gastrointestinal: Negative for abdominal pain, constipation, diarrhea, nausea and vomiting.  Musculoskeletal: Negative  for back pain and myalgias.  Skin: Positive for rash.  Neurological: Positive for numbness. Negative for dizziness, light-headedness and headaches.    Objective:   Physical Exam:  LMP 01/28/2012  ECOG: 1  Physical Exam Constitutional:      General: She is not in acute distress.    Appearance: Normal appearance. She is not ill-appearing or diaphoretic.  HENT:     Head: Atraumatic.  Skin:    Findings: Erythema and rash present.     Comments: An erythematous rash is noted over the forehead cheeks and bridge of the nose.  There are multiple small pustules noted.  The toenails on the right foot show that the great toenail, fourth and fifth times nails are hyperpigmented.  The toenail of the great right toe is partially avulsed.  No purulent discharge is noted.  Neurological:     Mental Status: She is alert.  Psychiatric:        Mood and Affect: Mood normal.        Behavior: Behavior normal.        Thought Content: Thought content normal.        Judgment: Judgment normal.     Lab Review:     Component Value Date/Time   NA 140 04/06/2019 1320   K 3.8 04/06/2019 1320   CL 107 04/06/2019 1320   CO2 25 04/06/2019 1320   GLUCOSE 104 (H) 04/06/2019 1320   BUN 10 04/06/2019 1320   CREATININE 0.73  04/06/2019 1320   CREATININE 0.64 02/08/2015 1505   CALCIUM 8.7 (L) 04/06/2019 1320   PROT 6.4 (L) 04/06/2019 1320   ALBUMIN 3.4 (L) 04/06/2019 1320   AST 18 04/06/2019 1320   ALT 19 04/06/2019 1320   ALKPHOS 69 04/06/2019 1320   BILITOT 1.0 04/06/2019 1320   GFRNONAA >60 04/06/2019 1320   GFRAA >60 04/06/2019 1320       Component Value Date/Time   WBC 6.7 04/06/2019 1320   WBC 9.3 11/23/2018 1500   RBC 3.63 (L) 04/06/2019 1320   HGB 11.3 (L) 04/06/2019 1320   HCT 34.7 (L) 04/06/2019 1320   PLT 269 04/06/2019 1320   MCV 95.6 04/06/2019 1320   MCH 31.1 04/06/2019 1320   MCHC 32.6 04/06/2019 1320   RDW 16.5 (H) 04/06/2019 1320   LYMPHSABS 1.1 04/06/2019 1320   MONOABS 0.5 04/06/2019 1320   EOSABS 0.1 04/06/2019 1320   BASOSABS 0.0 04/06/2019 1320   -------------------------------  Imaging from last 24 hours (if applicable):  Radiology interpretation: No results found.

## 2019-04-07 NOTE — Assessment & Plan Note (Signed)
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 Adj chemo with DD Adriamycin and Cytoxan followed by Taxol weekly X 12 2. Adj XRT 3. Adj Anti estrogen therapy --------------------------------------------------------------------------------------------- Current treatment:Completed 4 cycles ofdose dense Adriamycin and Cytoxan, today is cycle10Taxol Chemo toxicities:Nausea has resolved. Labs reviewed 1.Chemo induced anemia: Being monitored, today's hemoglobin 2.Monitoring closely for peripheral neuropathy. I reduced the dosage of her chemotherapy with cycle 8.  I suspect that she will not be able to tolerate beyond 10 cycles of chemo.  We will watch this very closely. Encouraged her to eat more protein  Return to clinicweekly for Taxol every other week for follow-up with me.

## 2019-04-10 ENCOUNTER — Other Ambulatory Visit: Payer: Self-pay | Admitting: Family Medicine

## 2019-04-10 DIAGNOSIS — I10 Essential (primary) hypertension: Secondary | ICD-10-CM

## 2019-04-13 NOTE — Progress Notes (Signed)
Patient Care Team: Carollee Herter, Alferd Apa, DO as PCP - General Dorna Leitz, MD as Consulting Physician (Orthopedic Surgery) Erroll Luna, MD as Consulting Physician (General Surgery) Nicholas Lose, MD as Consulting Physician (Hematology and Oncology) Kyung Rudd, MD as Consulting Physician (Radiation Oncology) Rockwell Germany, RN as Oncology Nurse Navigator Mauro Kaufmann, RN as Oncology Nurse Navigator  DIAGNOSIS:    ICD-10-CM   1. Malignant neoplasm of upper-outer quadrant of left breast in female, estrogen receptor positive ([place])  C50.412    Z17.0     SUMMARY OF ONCOLOGIC HISTORY: Oncology History  Malignant neoplasm of upper-outer quadrant of left breast in female, estrogen receptor positive ([place])  08/24/2018 Initial Biopsy   Screening detected left breast mass at 2:30 position 6 mm in size with one abnormal lymph node and stable benign calcifications, biopsy of the mass grade 2 IDC with DCIS and lymphovascular invasion, lymph node biopsy is positive, ER 95%, PR 40%, Ki-67 20%, HER-2 -1+ by IHC, T1b N1 stage Ib   08/31/2018 Cancer Staging   Staging form: Breast, AJCC 8th Edition - Clinical: Stage IB (cT1b, cN1(f), cM0, G2, ER+, PR+, HER2-) - Signed by Nicholas Lose, MD on 08/31/2018   10/11/2018 Surgery   Left lumpectomy: Grade 2 IDC with DCIS, 1 cm, margins negative, DCIS focally 0.1 cm from superior margin, 2/2 lymph nodes positive, ER 95%, PR 45%, HER-2 negative, Ki-67 20% AXLND: 3/20 LN Pos (total 5/22)   10/18/2018 Cancer Staging   Staging form: Breast, AJCC 8th Edition - Pathologic stage from 10/18/2018: Stage IB (pT1b, pN2a, cM0, G2, ER+, PR+, HER2-) - Signed by Nicholas Lose, MD on 11/14/2018   12/15/2018 -  Chemotherapy   The patient had DOXOrubicin (ADRIAMYCIN) chemo injection 142 mg, 60 mg/m2 = 142 mg, Intravenous,  Once, 4 of 4 cycles Administration: 142 mg (12/15/2018), 142 mg (12/29/2018), 142 mg (01/12/2019), 142 mg (01/26/2019) palonosetron (ALOXI) injection 0.25 mg,  0.25 mg, Intravenous,  Once, 4 of 4 cycles Administration: 0.25 mg (12/15/2018), 0.25 mg (12/29/2018), 0.25 mg (01/12/2019), 0.25 mg (01/26/2019) pegfilgrastim-cbqv (UDENYCA) injection 6 mg, 6 mg, Subcutaneous, Once, 4 of 4 cycles Administration: 6 mg (12/17/2018), 6 mg (12/31/2018), 6 mg (01/14/2019), 6 mg (01/28/2019) cyclophosphamide (CYTOXAN) 1,420 mg in sodium chloride 0.9 % 250 mL chemo infusion, 600 mg/m2 = 1,420 mg, Intravenous,  Once, 4 of 4 cycles Administration: 1,420 mg (12/15/2018), 1,420 mg (12/29/2018), 1,420 mg (01/12/2019), 1,420 mg (01/26/2019) PACLitaxel (TAXOL) 192 mg in sodium chloride 0.9 % 250 mL chemo infusion (</= 79m/m2), 80 mg/m2 = 192 mg, Intravenous,  Once, 9 of 12 cycles Dose modification: 60 mg/m2 (original dose 80 mg/m2, Cycle 12, Reason: Dose not tolerated) Administration: 192 mg (02/09/2019), 192 mg (02/16/2019), 192 mg (02/23/2019), 192 mg (03/02/2019), 192 mg (03/09/2019), 192 mg (03/16/2019), 192 mg (03/23/2019), 144 mg (03/30/2019), 144 mg (04/06/2019) fosaprepitant (EMEND) 150 mg, dexamethasone (DECADRON) 12 mg in sodium chloride 0.9 % 145 mL IVPB, , Intravenous,  Once, 4 of 4 cycles Administration:  (12/15/2018),  (12/29/2018),  (01/12/2019),  (01/26/2019)  for chemotherapy treatment.      CHIEF COMPLIANT: Cycle 10 Taxol  INTERVAL HISTORY: Kayla Jabbouris a 63y.o. with above-mentioned history of left breast cancer who underwent a lumpectomy followed by an axillary lymph node dissection and iscurrently onadjuvant chemotherapy. She completed 4 cycles ofdose dense Adriamycin and Cytoxanand is currently on weekly Taxol treatments. She presents to the clinic todayforcycle10.  She continues to have stable neuropathy in the tips of the fingers especially in  the right hand. She has noticed some bleeding around the great toe which is subsided.  She was prescribed gabapentin last week and appears to be tolerating it well.  REVIEW OF SYSTEMS:   Constitutional: Denies fevers, chills  or abnormal weight loss Eyes: Denies blurriness of vision Ears, nose, mouth, throat, and face: Denies mucositis or sore throat Respiratory: Denies cough, dyspnea or wheezes Cardiovascular: Denies palpitation, chest discomfort Gastrointestinal: Denies nausea, heartburn or change in bowel habits Skin: Denies abnormal skin rashes Lymphatics: Denies new lymphadenopathy or easy bruising Neurological: Denies numbness, tingling or new weaknesses Behavioral/Psych: Peripheral neuropathy in the hands and toes mild at the tips of the fingers. Extremities: No lower extremity edema Breast: denies any pain or lumps or nodules in either breasts All other systems were reviewed with the patient and are negative.  I have reviewed the past medical history, past surgical history, social history and family history with the patient and they are unchanged from previous note.  ALLERGIES:  is allergic to adhesive [tape]; erythromycin; lisinopril; and penicillins.  MEDICATIONS:  Current Outpatient Medications  Medication Sig Dispense Refill  . amLODipine (NORVASC) 5 MG tablet Take 1 tablet (5 mg total) by mouth daily. Needs ov before any more refills 90 tablet 0  . carvedilol (COREG) 25 MG tablet TAKE 1 TABLET(25 MG) BY MOUTH TWICE DAILY WITH A MEAL. Needs ov before any more refills 180 tablet 0  . EPINEPHRINE 0.3 mg/0.3 mL IJ SOAJ injection INJECT 0.3 ML(1 SYRINGE) IN THE MUSCLE AS NEEDED FOR ALLERGIC REACTION 2 Device 0  . furosemide (LASIX) 20 MG tablet Needs ov before any more refills 90 tablet 0  . gabapentin (NEURONTIN) 300 MG capsule Take 1 capsule (300 mg total) by mouth at bedtime. 30 capsule 2  . lidocaine-prilocaine (EMLA) cream Apply to affected area once 30 g 3  . LORazepam (ATIVAN) 0.5 MG tablet TAKE 1 TABLET(0.5 MG) BY MOUTH AT BEDTIME AS NEEDED FOR SLEEP 30 tablet 1  . meloxicam (MOBIC) 15 MG tablet TAKE 1 TABLET(15 MG) BY MOUTH DAILY AS NEEDED FOR PAIN 90 tablet 3  . metroNIDAZOLE (METROGEL) 0.75  % gel Apply 1 application topically 2 (two) times daily. 45 g 2  . ondansetron (ZOFRAN) 8 MG tablet Take 1 tablet (8 mg total) by mouth 2 (two) times daily as needed. Start on the third day after chemotherapy. 30 tablet 1  . prochlorperazine (COMPAZINE) 10 MG tablet TAKE 1 TABLET(10 MG) BY MOUTH EVERY 6 HOURS AS NEEDED FOR NAUSEA OR VOMITING 30 tablet 1  . SYNTHROID 100 MCG tablet 1 po qd 90 tablet 3  . valACYclovir (VALTREX) 1000 MG tablet TAKE 1 TABLET BY MOUTH THREE TIMES DAILY, DISCONTINUE ALL PREVIOUS REFILLS FOR THIS MEDICATION 30 tablet 5   No current facility-administered medications for this visit.     PHYSICAL EXAMINATION: ECOG PERFORMANCE STATUS: 1 - Symptomatic but completely ambulatory  Vitals:   04/14/19 1038  BP: (!) 156/83  Pulse: 72  Resp: 19  Temp: 98.2 F (36.8 C)  SpO2: 100%   Filed Weights   04/14/19 1038  Weight: 265 lb 3.2 oz (120.3 kg)    GENERAL: alert, no distress and comfortable SKIN: skin color, texture, turgor are normal, no rashes or significant lesions EYES: normal, Conjunctiva are pink and non-injected, sclera clear OROPHARYNX: no exudate, no erythema and lips, buccal mucosa, and tongue normal  NECK: supple, thyroid normal size, non-tender, without nodularity LYMPH: no palpable lymphadenopathy in the cervical, axillary or inguinal LUNGS: clear to auscultation  and percussion with normal breathing effort HEART: regular rate & rhythm and no murmurs and no lower extremity edema ABDOMEN: abdomen soft, non-tender and normal bowel sounds MUSCULOSKELETAL: no cyanosis of digits and no clubbing  NEURO: alert & oriented x 3 with fluent speech, no focal motor/sensory deficits EXTREMITIES: No lower extremity edema  LABORATORY DATA:  I have reviewed the data as listed CMP Latest Ref Rng & Units 04/06/2019 03/30/2019 03/23/2019  Glucose 70 - 99 mg/dL 104(H) 98 124(H)  BUN 8 - 23 mg/dL 10 15 13   Creatinine 0.44 - 1.00 mg/dL 0.73 0.74 0.79  Sodium 135 - 145  mmol/L 140 140 141  Potassium 3.5 - 5.1 mmol/L 3.8 4.0 4.2  Chloride 98 - 111 mmol/L 107 106 107  CO2 22 - 32 mmol/L 25 24 22   Calcium 8.9 - 10.3 mg/dL 8.7(L) 9.1 9.0  Total Protein 6.5 - 8.1 g/dL 6.4(L) 6.2(L) 6.3(L)  Total Bilirubin 0.3 - 1.2 mg/dL 1.0 0.8 0.7  Alkaline Phos 38 - 126 U/L 69 63 65  AST 15 - 41 U/L 18 18 17   ALT 0 - 44 U/L 19 22 20     Lab Results  Component Value Date   WBC 8.2 04/14/2019   HGB 10.8 (L) 04/14/2019   HCT 33.1 (L) 04/14/2019   MCV 95.7 04/14/2019   PLT 236 04/14/2019   NEUTROABS 6.0 04/14/2019    ASSESSMENT & PLAN:  Malignant neoplasm of upper-outer quadrant of left breast in female, estrogen receptor positive ([facility]) Left lumpectomy: Grade 2 IDC with DCIS, 1 cm, margins negative, DCIS focally 0.1 cm from superior margin, 2/2 lymph nodes positive, ER 95%, PR 45%, HER-2 negative, Ki-67 20% AXLND: 3/20 LN Pos (total 5/22)  Treatment Plan: 1. Adj chemo with DD Adriamycin and Cytoxan followed by Taxol weekly X 12 2. Adj XRT 3. Adj Anti estrogen therapy --------------------------------------------------------------------------------------------- Current treatment:Completed 4 cycles ofdose dense Adriamycin and Cytoxan, today is cycle10Taxol Chemo toxicities:Nausea has resolved. Labs reviewed 1.Chemo induced anemia: Being monitored, today's hemoglobin 2.Monitoring closely for peripheral neuropathy. I reduced the dosage of her chemotherapy with cycle 8.    We will be able to finish all 12 cycles of chemotherapy based on stabilization of neuropathy symptoms.    Return to clinicin 2 weeks for her last cycle of chemo. I sent a request for her to see radiation oncology after the chemo is completed. She already has an appointment for removal of the port in October.   No orders of the defined types were placed in this encounter.  The patient has a good understanding of the overall plan. she agrees with it. she will call with any problems that  may develop before the next visit here.  Nicholas Lose, MD 04/14/2019  Julious Oka Dorshimer am acting as scribe for Dr. Nicholas Lose.  I have reviewed the above documentation for accuracy and completeness, and I agree with the above.

## 2019-04-14 ENCOUNTER — Inpatient Hospital Stay: Payer: BC Managed Care – PPO

## 2019-04-14 ENCOUNTER — Other Ambulatory Visit: Payer: Self-pay

## 2019-04-14 ENCOUNTER — Other Ambulatory Visit: Payer: Self-pay | Admitting: *Deleted

## 2019-04-14 ENCOUNTER — Inpatient Hospital Stay (HOSPITAL_BASED_OUTPATIENT_CLINIC_OR_DEPARTMENT_OTHER): Payer: BC Managed Care – PPO | Admitting: Hematology and Oncology

## 2019-04-14 DIAGNOSIS — Z95828 Presence of other vascular implants and grafts: Secondary | ICD-10-CM

## 2019-04-14 DIAGNOSIS — C50412 Malignant neoplasm of upper-outer quadrant of left female breast: Secondary | ICD-10-CM

## 2019-04-14 DIAGNOSIS — Z170 Estrogen receptor positive status [ER+]: Secondary | ICD-10-CM

## 2019-04-14 LAB — CMP (CANCER CENTER ONLY)
ALT: 18 U/L (ref 0–44)
AST: 16 U/L (ref 15–41)
Albumin: 3.3 g/dL — ABNORMAL LOW (ref 3.5–5.0)
Alkaline Phosphatase: 66 U/L (ref 38–126)
Anion gap: 7 (ref 5–15)
BUN: 14 mg/dL (ref 8–23)
CO2: 26 mmol/L (ref 22–32)
Calcium: 8.4 mg/dL — ABNORMAL LOW (ref 8.9–10.3)
Chloride: 107 mmol/L (ref 98–111)
Creatinine: 0.75 mg/dL (ref 0.44–1.00)
GFR, Est AFR Am: >60 mL/min (ref >60)
GFR, Estimated: >60 mL/min (ref >60)
Glucose, Bld: 107 mg/dL — ABNORMAL HIGH (ref 70–99)
Potassium: 4 mmol/L (ref 3.5–5.1)
Sodium: 140 mmol/L (ref 135–145)
Total Bilirubin: 0.6 mg/dL (ref 0.3–1.2)
Total Protein: 6.2 g/dL — ABNORMAL LOW (ref 6.5–8.1)

## 2019-04-14 LAB — CBC WITH DIFFERENTIAL (CANCER CENTER ONLY)
Abs Immature Granulocytes: 0.21 10*3/uL — ABNORMAL HIGH (ref 0.00–0.07)
Basophils Absolute: 0.1 10*3/uL (ref 0.0–0.1)
Basophils Relative: 1 %
Eosinophils Absolute: 0.1 10*3/uL (ref 0.0–0.5)
Eosinophils Relative: 2 %
HCT: 33.1 % — ABNORMAL LOW (ref 36.0–46.0)
Hemoglobin: 10.8 g/dL — ABNORMAL LOW (ref 12.0–15.0)
Immature Granulocytes: 3 %
Lymphocytes Relative: 14 %
Lymphs Abs: 1.1 10*3/uL (ref 0.7–4.0)
MCH: 31.2 pg (ref 26.0–34.0)
MCHC: 32.6 g/dL (ref 30.0–36.0)
MCV: 95.7 fL (ref 80.0–100.0)
Monocytes Absolute: 0.6 10*3/uL (ref 0.1–1.0)
Monocytes Relative: 7 %
Neutro Abs: 6 10*3/uL (ref 1.7–7.7)
Neutrophils Relative %: 73 %
Platelet Count: 236 10*3/uL (ref 150–400)
RBC: 3.46 MIL/uL — ABNORMAL LOW (ref 3.87–5.11)
RDW: 15.8 % — ABNORMAL HIGH (ref 11.5–15.5)
WBC Count: 8.2 10*3/uL (ref 4.0–10.5)
nRBC: 0 % (ref 0.0–0.2)

## 2019-04-14 MED ORDER — SODIUM CHLORIDE 0.9% FLUSH
10.0000 mL | INTRAVENOUS | Status: DC | PRN
  Administered 2019-04-14: 10 mL
  Filled 2019-04-14: qty 10

## 2019-04-14 MED ORDER — SODIUM CHLORIDE 0.9 % IV SOLN
20.0000 mg | Freq: Once | INTRAVENOUS | Status: AC
  Administered 2019-04-14: 12:00:00 20 mg via INTRAVENOUS
  Filled 2019-04-14: qty 20

## 2019-04-14 MED ORDER — SODIUM CHLORIDE 0.9 % IV SOLN
60.0000 mg/m2 | Freq: Once | INTRAVENOUS | Status: AC
  Administered 2019-04-14: 13:00:00 144 mg via INTRAVENOUS
  Filled 2019-04-14: qty 24

## 2019-04-14 MED ORDER — HEPARIN SOD (PORK) LOCK FLUSH 100 UNIT/ML IV SOLN
500.0000 [IU] | Freq: Once | INTRAVENOUS | Status: AC | PRN
  Administered 2019-04-14: 14:00:00 500 [IU]
  Filled 2019-04-14: qty 5

## 2019-04-14 MED ORDER — FAMOTIDINE IN NACL 20-0.9 MG/50ML-% IV SOLN
20.0000 mg | Freq: Once | INTRAVENOUS | Status: AC
  Administered 2019-04-14: 20 mg via INTRAVENOUS

## 2019-04-14 MED ORDER — DIPHENHYDRAMINE HCL 50 MG/ML IJ SOLN
INTRAMUSCULAR | Status: AC
  Filled 2019-04-14: qty 1

## 2019-04-14 MED ORDER — DIPHENHYDRAMINE HCL 50 MG/ML IJ SOLN
50.0000 mg | Freq: Once | INTRAMUSCULAR | Status: AC
  Administered 2019-04-14: 11:00:00 50 mg via INTRAVENOUS

## 2019-04-14 MED ORDER — SODIUM CHLORIDE 0.9 % IV SOLN
Freq: Once | INTRAVENOUS | Status: AC
  Administered 2019-04-14: 11:00:00 via INTRAVENOUS
  Filled 2019-04-14: qty 250

## 2019-04-14 MED ORDER — FAMOTIDINE IN NACL 20-0.9 MG/50ML-% IV SOLN
INTRAVENOUS | Status: AC
  Filled 2019-04-14: qty 50

## 2019-04-14 NOTE — Patient Instructions (Signed)

## 2019-04-14 NOTE — Patient Instructions (Signed)
[facility] Discharge Instructions for Patients Receiving Chemotherapy  Today you received the following chemotherapy agent: Taxol  To help prevent nausea and vomiting after your treatment, we encourage you to take your nausea medication as directed by your MD.   If you develop nausea and vomiting that is not controlled by your nausea medication, call the clinic.   BELOW ARE SYMPTOMS THAT SHOULD BE REPORTED IMMEDIATELY:  *FEVER GREATER THAN 100.5 F  *CHILLS WITH OR WITHOUT FEVER  NAUSEA AND VOMITING THAT IS NOT CONTROLLED WITH YOUR NAUSEA MEDICATION  *UNUSUAL SHORTNESS OF BREATH  *UNUSUAL BRUISING OR BLEEDING  TENDERNESS IN MOUTH AND THROAT WITH OR WITHOUT PRESENCE OF ULCERS  *URINARY PROBLEMS  *BOWEL PROBLEMS  UNUSUAL RASH Items with * indicate a potential emergency and should be followed up as soon as possible.  Feel free to call the clinic should you have any questions or concerns. The clinic phone number is [phone].  Please show the CHEMO ALERT CARD at check-in to the Emergency Department and triage nurse. Coronavirus (COVID-19) Are you at risk?  Are you at risk for the Coronavirus (COVID-19)?  To be considered HIGH RISK for Coronavirus (COVID-19), you have to meet the following criteria:  . Traveled to [place], [place], [place], [place] or [place]; or in the [place] to [place], [place], [place], or [place]; and have fever, cough, and shortness of breath within the last 2 weeks of travel OR . Been in close contact with a person diagnosed with COVID-19 within the last 2 weeks and have fever, cough, and shortness of breath . IF YOU DO NOT MEET THESE CRITERIA, YOU ARE CONSIDERED LOW RISK FOR COVID-19.  What to do if you are HIGH RISK for COVID-19?  . If you are having a medical emergency, call 911. . Seek medical care right away. Before you go to a doctor's office, urgent care or emergency department, call ahead and tell them about your  recent travel, contact with someone diagnosed with COVID-19, and your symptoms. You should receive instructions from your physician's office regarding next steps of care.  . When you arrive at healthcare provider, tell the healthcare staff immediately you have returned from visiting [place], [place], [place], [place] or [place]; or traveled in the [place] to [place], [place], [place], or [place]; in the last two weeks or you have been in close contact with a person diagnosed with COVID-19 in the last 2 weeks.   . Tell the health care staff about your symptoms: fever, cough and shortness of breath. . After you have been seen by a medical provider, you will be either: o Tested for (COVID-19) and discharged home on quarantine except to seek medical care if symptoms worsen, and asked to  - Stay home and avoid contact with others until you get your results (4-5 days)  - Avoid travel on public transportation if possible (such as bus, train, or airplane) or o Sent to the Emergency Department by EMS for evaluation, COVID-19 testing, and possible admission depending on your condition and test results.  What to do if you are LOW RISK for COVID-19?  Reduce your risk of any infection by using the same precautions used for avoiding the common cold or flu:  . Wash your hands often with soap and warm water for at least 20 seconds.  If soap and water are not readily available, use an alcohol-based hand sanitizer with at least 60% alcohol.  . If coughing   or sneezing, cover your mouth and nose by coughing or sneezing into the elbow areas of your shirt or coat, into a tissue or into your sleeve (not your hands). . Avoid shaking hands with others and consider head nods or verbal greetings only. . Avoid touching your eyes, nose, or mouth with unwashed hands.  . Avoid close contact with people who are sick. . Avoid places or events with large numbers of people in one location, like concerts or sporting  events. . Carefully consider travel plans you have or are making. . If you are planning any travel outside or inside the [place], visit the CDC's Travelers' Health webpage for the latest health notices. . If you have some symptoms but not all symptoms, continue to monitor at home and seek medical attention if your symptoms worsen. . If you are having a medical emergency, call 911.   ADDITIONAL HEALTHCARE OPTIONS FOR PATIENTS  [facility] / e-Visit: [email or web address]         [facility]: [phone]  [facility]: [phone]                   [facility]: [phone]    

## 2019-04-20 ENCOUNTER — Inpatient Hospital Stay: Payer: BC Managed Care – PPO

## 2019-04-20 ENCOUNTER — Other Ambulatory Visit: Payer: Self-pay

## 2019-04-20 ENCOUNTER — Inpatient Hospital Stay: Payer: BC Managed Care – PPO | Attending: Hematology and Oncology

## 2019-04-20 VITALS — BP 146/78 | HR 81 | Temp 99.1°F | Resp 18

## 2019-04-20 DIAGNOSIS — Z79899 Other long term (current) drug therapy: Secondary | ICD-10-CM | POA: Diagnosis not present

## 2019-04-20 DIAGNOSIS — D6481 Anemia due to antineoplastic chemotherapy: Secondary | ICD-10-CM | POA: Diagnosis not present

## 2019-04-20 DIAGNOSIS — T451X5A Adverse effect of antineoplastic and immunosuppressive drugs, initial encounter: Secondary | ICD-10-CM | POA: Diagnosis not present

## 2019-04-20 DIAGNOSIS — C50412 Malignant neoplasm of upper-outer quadrant of left female breast: Secondary | ICD-10-CM | POA: Insufficient documentation

## 2019-04-20 DIAGNOSIS — Z791 Long term (current) use of non-steroidal anti-inflammatories (NSAID): Secondary | ICD-10-CM | POA: Diagnosis not present

## 2019-04-20 DIAGNOSIS — G620 Drug-induced polyneuropathy: Secondary | ICD-10-CM | POA: Diagnosis not present

## 2019-04-20 DIAGNOSIS — Z95828 Presence of other vascular implants and grafts: Secondary | ICD-10-CM

## 2019-04-20 DIAGNOSIS — Z5111 Encounter for antineoplastic chemotherapy: Secondary | ICD-10-CM | POA: Insufficient documentation

## 2019-04-20 DIAGNOSIS — Z170 Estrogen receptor positive status [ER+]: Secondary | ICD-10-CM | POA: Diagnosis not present

## 2019-04-20 DIAGNOSIS — Z9221 Personal history of antineoplastic chemotherapy: Secondary | ICD-10-CM | POA: Insufficient documentation

## 2019-04-20 LAB — CBC WITH DIFFERENTIAL (CANCER CENTER ONLY)
Abs Immature Granulocytes: 0.06 10*3/uL (ref 0.00–0.07)
Basophils Absolute: 0.1 10*3/uL (ref 0.0–0.1)
Basophils Relative: 1 %
Eosinophils Absolute: 0.1 10*3/uL (ref 0.0–0.5)
Eosinophils Relative: 2 %
HCT: 33.8 % — ABNORMAL LOW (ref 36.0–46.0)
Hemoglobin: 10.9 g/dL — ABNORMAL LOW (ref 12.0–15.0)
Immature Granulocytes: 1 %
Lymphocytes Relative: 13 %
Lymphs Abs: 1 10*3/uL (ref 0.7–4.0)
MCH: 30.8 pg (ref 26.0–34.0)
MCHC: 32.2 g/dL (ref 30.0–36.0)
MCV: 95.5 fL (ref 80.0–100.0)
Monocytes Absolute: 0.4 10*3/uL (ref 0.1–1.0)
Monocytes Relative: 5 %
Neutro Abs: 6.2 10*3/uL (ref 1.7–7.7)
Neutrophils Relative %: 78 %
Platelet Count: 243 10*3/uL (ref 150–400)
RBC: 3.54 MIL/uL — ABNORMAL LOW (ref 3.87–5.11)
RDW: 14.8 % (ref 11.5–15.5)
WBC Count: 7.9 10*3/uL (ref 4.0–10.5)
nRBC: 0 % (ref 0.0–0.2)

## 2019-04-20 LAB — CMP (CANCER CENTER ONLY)
ALT: 17 U/L (ref 0–44)
AST: 15 U/L (ref 15–41)
Albumin: 3.3 g/dL — ABNORMAL LOW (ref 3.5–5.0)
Alkaline Phosphatase: 70 U/L (ref 38–126)
Anion gap: 8 (ref 5–15)
BUN: 12 mg/dL (ref 8–23)
CO2: 25 mmol/L (ref 22–32)
Calcium: 8.7 mg/dL — ABNORMAL LOW (ref 8.9–10.3)
Chloride: 108 mmol/L (ref 98–111)
Creatinine: 0.76 mg/dL (ref 0.44–1.00)
GFR, Est AFR Am: >60 mL/min (ref >60)
GFR, Estimated: >60 mL/min (ref >60)
Glucose, Bld: 127 mg/dL — ABNORMAL HIGH (ref 70–99)
Potassium: 4 mmol/L (ref 3.5–5.1)
Sodium: 141 mmol/L (ref 135–145)
Total Bilirubin: 0.8 mg/dL (ref 0.3–1.2)
Total Protein: 6.1 g/dL — ABNORMAL LOW (ref 6.5–8.1)

## 2019-04-20 MED ORDER — SODIUM CHLORIDE 0.9 % IV SOLN
20.0000 mg | Freq: Once | INTRAVENOUS | Status: AC
  Administered 2019-04-20: 20 mg via INTRAVENOUS
  Filled 2019-04-20: qty 20

## 2019-04-20 MED ORDER — FAMOTIDINE IN NACL 20-0.9 MG/50ML-% IV SOLN
20.0000 mg | Freq: Once | INTRAVENOUS | Status: AC
  Administered 2019-04-20: 15:00:00 20 mg via INTRAVENOUS

## 2019-04-20 MED ORDER — DIPHENHYDRAMINE HCL 50 MG/ML IJ SOLN
INTRAMUSCULAR | Status: AC
  Filled 2019-04-20: qty 1

## 2019-04-20 MED ORDER — FAMOTIDINE IN NACL 20-0.9 MG/50ML-% IV SOLN
INTRAVENOUS | Status: AC
  Filled 2019-04-20: qty 50

## 2019-04-20 MED ORDER — HEPARIN SOD (PORK) LOCK FLUSH 100 UNIT/ML IV SOLN
500.0000 [IU] | Freq: Once | INTRAVENOUS | Status: AC | PRN
  Administered 2019-04-20: 500 [IU]
  Filled 2019-04-20: qty 5

## 2019-04-20 MED ORDER — SODIUM CHLORIDE 0.9 % IV SOLN
60.0000 mg/m2 | Freq: Once | INTRAVENOUS | Status: AC
  Administered 2019-04-20: 144 mg via INTRAVENOUS
  Filled 2019-04-20: qty 24

## 2019-04-20 MED ORDER — SODIUM CHLORIDE 0.9 % IV SOLN
Freq: Once | INTRAVENOUS | Status: AC
  Administered 2019-04-20: 14:00:00 via INTRAVENOUS
  Filled 2019-04-20: qty 250

## 2019-04-20 MED ORDER — DIPHENHYDRAMINE HCL 50 MG/ML IJ SOLN
50.0000 mg | Freq: Once | INTRAMUSCULAR | Status: AC
  Administered 2019-04-20: 50 mg via INTRAVENOUS

## 2019-04-20 MED ORDER — SODIUM CHLORIDE 0.9% FLUSH
10.0000 mL | INTRAVENOUS | Status: DC | PRN
  Administered 2019-04-20: 10 mL
  Filled 2019-04-20: qty 10

## 2019-04-20 NOTE — Patient Instructions (Signed)

## 2019-04-20 NOTE — Patient Instructions (Signed)
[facility] Discharge Instructions for Patients Receiving Chemotherapy  Today you received the following chemotherapy agent: Taxol  To help prevent nausea and vomiting after your treatment, we encourage you to take your nausea medication as directed by your MD.   If you develop nausea and vomiting that is not controlled by your nausea medication, call the clinic.   BELOW ARE SYMPTOMS THAT SHOULD BE REPORTED IMMEDIATELY:  *FEVER GREATER THAN 100.5 F  *CHILLS WITH OR WITHOUT FEVER  NAUSEA AND VOMITING THAT IS NOT CONTROLLED WITH YOUR NAUSEA MEDICATION  *UNUSUAL SHORTNESS OF BREATH  *UNUSUAL BRUISING OR BLEEDING  TENDERNESS IN MOUTH AND THROAT WITH OR WITHOUT PRESENCE OF ULCERS  *URINARY PROBLEMS  *BOWEL PROBLEMS  UNUSUAL RASH Items with * indicate a potential emergency and should be followed up as soon as possible.  Feel free to call the clinic should you have any questions or concerns. The clinic phone number is [phone].  Please show the CHEMO ALERT CARD at check-in to the Emergency Department and triage nurse. Coronavirus (COVID-19) Are you at risk?  Are you at risk for the Coronavirus (COVID-19)?  To be considered HIGH RISK for Coronavirus (COVID-19), you have to meet the following criteria:  . Traveled to [place], [place], [place], [place] or [place]; or in the [place] to [place], [place], [place], or [place]; and have fever, cough, and shortness of breath within the last 2 weeks of travel OR . Been in close contact with a person diagnosed with COVID-19 within the last 2 weeks and have fever, cough, and shortness of breath . IF YOU DO NOT MEET THESE CRITERIA, YOU ARE CONSIDERED LOW RISK FOR COVID-19.  What to do if you are HIGH RISK for COVID-19?  . If you are having a medical emergency, call 911. . Seek medical care right away. Before you go to a doctor's office, urgent care or emergency department, call ahead and tell them about your  recent travel, contact with someone diagnosed with COVID-19, and your symptoms. You should receive instructions from your physician's office regarding next steps of care.  . When you arrive at healthcare provider, tell the healthcare staff immediately you have returned from visiting [place], [place], [place], [place] or [place]; or traveled in the [place] to [place], [place], [place], or [place]; in the last two weeks or you have been in close contact with a person diagnosed with COVID-19 in the last 2 weeks.   . Tell the health care staff about your symptoms: fever, cough and shortness of breath. . After you have been seen by a medical provider, you will be either: o Tested for (COVID-19) and discharged home on quarantine except to seek medical care if symptoms worsen, and asked to  - Stay home and avoid contact with others until you get your results (4-5 days)  - Avoid travel on public transportation if possible (such as bus, train, or airplane) or o Sent to the Emergency Department by EMS for evaluation, COVID-19 testing, and possible admission depending on your condition and test results.  What to do if you are LOW RISK for COVID-19?  Reduce your risk of any infection by using the same precautions used for avoiding the common cold or flu:  . Wash your hands often with soap and warm water for at least 20 seconds.  If soap and water are not readily available, use an alcohol-based hand sanitizer with at least 60% alcohol.  . If coughing   or sneezing, cover your mouth and nose by coughing or sneezing into the elbow areas of your shirt or coat, into a tissue or into your sleeve (not your hands). . Avoid shaking hands with others and consider head nods or verbal greetings only. . Avoid touching your eyes, nose, or mouth with unwashed hands.  . Avoid close contact with people who are sick. . Avoid places or events with large numbers of people in one location, like concerts or sporting  events. . Carefully consider travel plans you have or are making. . If you are planning any travel outside or inside the [place], visit the CDC's Travelers' Health webpage for the latest health notices. . If you have some symptoms but not all symptoms, continue to monitor at home and seek medical attention if your symptoms worsen. . If you are having a medical emergency, call 911.   ADDITIONAL HEALTHCARE OPTIONS FOR PATIENTS  [facility] / e-Visit: [email or web address]         [facility]: [phone]  [facility]: [phone]                   [facility]: [phone]    

## 2019-04-26 NOTE — Progress Notes (Signed)
Patient Care Team: Carollee Herter, Alferd Apa, DO as PCP - General Dorna Leitz, MD as Consulting Physician (Orthopedic Surgery) Erroll Luna, MD as Consulting Physician (General Surgery) Nicholas Lose, MD as Consulting Physician (Hematology and Oncology) Kyung Rudd, MD as Consulting Physician (Radiation Oncology) Rockwell Germany, RN as Oncology Nurse Navigator Mauro Kaufmann, RN as Oncology Nurse Navigator  DIAGNOSIS:    ICD-10-CM   1. Malignant neoplasm of upper-outer quadrant of left breast in female, estrogen receptor positive ([place])  C50.412    Z17.0   2. Chemotherapy-induced peripheral neuropathy ([facility])  G62.0    T45.1X5A     SUMMARY OF ONCOLOGIC HISTORY: Oncology History  Malignant neoplasm of upper-outer quadrant of left breast in female, estrogen receptor positive ([place])  08/24/2018 Initial Biopsy   Screening detected left breast mass at 2:30 position 6 mm in size with one abnormal lymph node and stable benign calcifications, biopsy of the mass grade 2 IDC with DCIS and lymphovascular invasion, lymph node biopsy is positive, ER 95%, PR 40%, Ki-67 20%, HER-2 -1+ by IHC, T1b N1 stage Ib   08/31/2018 Cancer Staging   Staging form: Breast, AJCC 8th Edition - Clinical: Stage IB (cT1b, cN1(f), cM0, G2, ER+, PR+, HER2-) - Signed by Nicholas Lose, MD on 08/31/2018   10/11/2018 Surgery   Left lumpectomy: Grade 2 IDC with DCIS, 1 cm, margins negative, DCIS focally 0.1 cm from superior margin, 2/2 lymph nodes positive, ER 95%, PR 45%, HER-2 negative, Ki-67 20% AXLND: 3/20 LN Pos (total 5/22)   10/18/2018 Cancer Staging   Staging form: Breast, AJCC 8th Edition - Pathologic stage from 10/18/2018: Stage IB (pT1b, pN2a, cM0, G2, ER+, PR+, HER2-) - Signed by Nicholas Lose, MD on 11/14/2018   12/15/2018 -  Chemotherapy   The patient had DOXOrubicin (ADRIAMYCIN) chemo injection 142 mg, 60 mg/m2 = 142 mg, Intravenous,  Once, 4 of 4 cycles Administration: 142 mg (12/15/2018), 142 mg (12/29/2018), 142  mg (01/12/2019), 142 mg (01/26/2019) palonosetron (ALOXI) injection 0.25 mg, 0.25 mg, Intravenous,  Once, 4 of 4 cycles Administration: 0.25 mg (12/15/2018), 0.25 mg (12/29/2018), 0.25 mg (01/12/2019), 0.25 mg (01/26/2019) pegfilgrastim-cbqv (UDENYCA) injection 6 mg, 6 mg, Subcutaneous, Once, 4 of 4 cycles Administration: 6 mg (12/17/2018), 6 mg (12/31/2018), 6 mg (01/14/2019), 6 mg (01/28/2019) cyclophosphamide (CYTOXAN) 1,420 mg in sodium chloride 0.9 % 250 mL chemo infusion, 600 mg/m2 = 1,420 mg, Intravenous,  Once, 4 of 4 cycles Administration: 1,420 mg (12/15/2018), 1,420 mg (12/29/2018), 1,420 mg (01/12/2019), 1,420 mg (01/26/2019) PACLitaxel (TAXOL) 192 mg in sodium chloride 0.9 % 250 mL chemo infusion (</= 58m/m2), 80 mg/m2 = 192 mg, Intravenous,  Once, 11 of 12 cycles Dose modification: 60 mg/m2 (original dose 80 mg/m2, Cycle 12, Reason: Dose not tolerated) Administration: 192 mg (02/09/2019), 192 mg (02/16/2019), 192 mg (02/23/2019), 192 mg (03/02/2019), 192 mg (03/09/2019), 192 mg (03/16/2019), 192 mg (03/23/2019), 144 mg (03/30/2019), 144 mg (04/06/2019), 144 mg (04/14/2019), 144 mg (04/20/2019) fosaprepitant (EMEND) 150 mg, dexamethasone (DECADRON) 12 mg in sodium chloride 0.9 % 145 mL IVPB, , Intravenous,  Once, 4 of 4 cycles Administration:  (12/15/2018),  (12/29/2018),  (01/12/2019),  (01/26/2019)  for chemotherapy treatment.      CHIEF COMPLIANT:  Cycle 12 Taxol, being held for worsening neuropathy  INTERVAL HISTORY: Kayla Tippyis a 63y.o. with above-mentioned history of left breast cancer who underwent a lumpectomy followed by an axillary lymph node dissection and iscurrently onadjuvant chemotherapy. She completed 4 cycles ofdose dense Adriamycin and Cytoxanand is currently  on weekly Taxol treatments. She presents to the clinic todayforcycle12.  Her neuropathy appears to be getting worse.  She is dropping objects difficulty with buttoning her shirt as well as difficulty with writing her name.   Because of this we will not do the last cycle.  REVIEW OF SYSTEMS:   Constitutional: Denies fevers, chills or abnormal weight loss Eyes: Denies blurriness of vision Ears, nose, mouth, throat, and face: Denies mucositis or sore throat Respiratory: Denies cough, dyspnea or wheezes Cardiovascular: Denies palpitation, chest discomfort Gastrointestinal: Denies nausea, heartburn or change in bowel habits Skin: Denies abnormal skin rashes Lymphatics: Denies new lymphadenopathy or easy bruising Neurological: Worsening peripheral neuropathy Behavioral/Psych: Mood is stable, no new changes  Extremities: No lower extremity edema Breast: denies any pain or lumps or nodules in either breasts All other systems were reviewed with the patient and are negative.  I have reviewed the past medical history, past surgical history, social history and family history with the patient and they are unchanged from previous note.  ALLERGIES:  is allergic to adhesive [tape]; erythromycin; lisinopril; and penicillins.  MEDICATIONS:  Current Outpatient Medications  Medication Sig Dispense Refill  . amLODipine (NORVASC) 5 MG tablet Take 1 tablet (5 mg total) by mouth daily. Needs ov before any more refills 90 tablet 0  . carvedilol (COREG) 25 MG tablet TAKE 1 TABLET(25 MG) BY MOUTH TWICE DAILY WITH A MEAL. Needs ov before any more refills 180 tablet 0  . EPINEPHRINE 0.3 mg/0.3 mL IJ SOAJ injection INJECT 0.3 ML(1 SYRINGE) IN THE MUSCLE AS NEEDED FOR ALLERGIC REACTION 2 Device 0  . furosemide (LASIX) 20 MG tablet Needs ov before any more refills 90 tablet 0  . gabapentin (NEURONTIN) 300 MG capsule Take 1 capsule (300 mg total) by mouth at bedtime. 30 capsule 2  . lidocaine-prilocaine (EMLA) cream Apply to affected area once 30 g 3  . LORazepam (ATIVAN) 0.5 MG tablet TAKE 1 TABLET(0.5 MG) BY MOUTH AT BEDTIME AS NEEDED FOR SLEEP 30 tablet 1  . meloxicam (MOBIC) 15 MG tablet TAKE 1 TABLET(15 MG) BY MOUTH DAILY AS NEEDED  FOR PAIN 90 tablet 3  . metroNIDAZOLE (METROGEL) 0.75 % gel Apply 1 application topically 2 (two) times daily. 45 g 2  . ondansetron (ZOFRAN) 8 MG tablet Take 1 tablet (8 mg total) by mouth 2 (two) times daily as needed. Start on the third day after chemotherapy. 30 tablet 1  . prochlorperazine (COMPAZINE) 10 MG tablet TAKE 1 TABLET(10 MG) BY MOUTH EVERY 6 HOURS AS NEEDED FOR NAUSEA OR VOMITING 30 tablet 1  . SYNTHROID 100 MCG tablet 1 po qd 90 tablet 3  . valACYclovir (VALTREX) 1000 MG tablet TAKE 1 TABLET BY MOUTH THREE TIMES DAILY, DISCONTINUE ALL PREVIOUS REFILLS FOR THIS MEDICATION 30 tablet 5   No current facility-administered medications for this visit.     PHYSICAL EXAMINATION: ECOG PERFORMANCE STATUS: 1 - Symptomatic but completely ambulatory  Vitals:   04/27/19 1355  BP: 135/71  Pulse: 78  Resp: 18  Temp: 97.9 F (36.6 C)  SpO2: 100%   Filed Weights   04/27/19 1355  Weight: 260 lb 8 oz (118.2 kg)    GENERAL: alert, no distress and comfortable SKIN: skin color, texture, turgor are normal, no rashes or significant lesions EYES: normal, Conjunctiva are pink and non-injected, sclera clear OROPHARYNX: no exudate, no erythema and lips, buccal mucosa, and tongue normal  NECK: supple, thyroid normal size, non-tender, without nodularity LYMPH: no palpable lymphadenopathy in the cervical,  axillary or inguinal LUNGS: clear to auscultation and percussion with normal breathing effort HEART: regular rate & rhythm and no murmurs and no lower extremity edema ABDOMEN: abdomen soft, non-tender and normal bowel sounds MUSCULOSKELETAL: no cyanosis of digits and no clubbing  NEURO: alert & oriented x 3 with fluent speech, no focal motor/sensory deficits EXTREMITIES: No lower extremity edema  LABORATORY DATA:  I have reviewed the data as listed CMP Latest Ref Rng & Units 04/20/2019 04/14/2019 04/06/2019  Glucose 70 - 99 mg/dL 127(H) 107(H) 104(H)  BUN 8 - 23 mg/dL _0 Creatinine  0.44 - 1.00 mg/dL 0.76 0.75 0.73  Sodium 135 - 145 mmol/L 141 140 140  Potassium 3.5 - 5.1 mmol/L 4.0 4.0 3.8  Chloride 98 - 111 mmol/L 108 107 107  CO2 22 - 32 mmol/L _1 Calcium 8.9 - 10.3 mg/dL 8.7(L) 8.4(L) 8.7(L)  Total Protein 6.5 - 8.1 g/dL 6.1(L) 6.2(L) 6.4(L)  Total Bilirubin 0.3 - 1.2 mg/dL 0.8 0.6 1.0  Alkaline Phos 38 - 126 U/L 70 66 69  AST 15 - 41 U/L _2 ALT 0 - 44 U/L _3 Lab Results  Component Value Date   WBC 6.8 04/27/2019   HGB 11.3 (L) 04/27/2019   HCT 34.9 (L) 04/27/2019   MCV 96.4 04/27/2019   PLT 255 04/27/2019   NEUTROABS 5.0 04/27/2019    ASSESSMENT & PLAN:  Malignant neoplasm of upper-outer quadrant of left breast in female, estrogen receptor positive ([facility]) Left lumpectomy: Grade 2 IDC with DCIS, 1 cm, margins negative, DCIS focally 0.1 cm from superior margin, 2/2 lymph nodes positive, ER 95%, PR 45%, HER-2 negative, Ki-67 20% AXLND: 3/20 LN Pos (total 5/22)  Treatment Plan: 1. Adj chemo with DD Adriamycin and Cytoxan followed by Taxol weekly X 12 2. Adj XRT 3. Adj Anti estrogen therapy --------------------------------------------------------------------------------------------- Current treatment:Completed 4 cycles ofdose dense Adriamycin and Cytoxan, today is cycle12Taxol being discontinued for worsening neuropathy Chemo toxicities:Nausea has resolved. Labs reviewed 1.Chemo induced anemia: Being monitored, today's hemoglobin 2.Chemo induced peripheral neuropathy.  Because of worsening of symptoms, we will not do the last and final treatment today.    Patient has appointments for radiation oncology for adjuvant radiation. I will see her after radiation is complete and start antiestrogen therapy. She has an appointment to have her port removed.  No orders of the defined types were placed in this encounter.  The patient has a good understanding of the overall plan. she agrees with it. she will call with any problems  that may develop before the next visit here.  Nicholas Lose, MD 04/27/2019  Julious Oka Dorshimer am acting as scribe for Dr. Nicholas Lose.  I have reviewed the above documentation for accuracy and completeness, and I agree with the above.

## 2019-04-27 ENCOUNTER — Inpatient Hospital Stay: Payer: BC Managed Care – PPO

## 2019-04-27 ENCOUNTER — Inpatient Hospital Stay (HOSPITAL_BASED_OUTPATIENT_CLINIC_OR_DEPARTMENT_OTHER): Payer: BC Managed Care – PPO | Admitting: Hematology and Oncology

## 2019-04-27 ENCOUNTER — Telehealth: Payer: Self-pay | Admitting: *Deleted

## 2019-04-27 ENCOUNTER — Other Ambulatory Visit: Payer: Self-pay

## 2019-04-27 VITALS — BP 135/71 | HR 78 | Temp 97.9°F | Resp 18 | Ht 66.0 in | Wt 260.5 lb

## 2019-04-27 DIAGNOSIS — G620 Drug-induced polyneuropathy: Secondary | ICD-10-CM

## 2019-04-27 DIAGNOSIS — Z95828 Presence of other vascular implants and grafts: Secondary | ICD-10-CM

## 2019-04-27 DIAGNOSIS — Z170 Estrogen receptor positive status [ER+]: Secondary | ICD-10-CM

## 2019-04-27 DIAGNOSIS — C50412 Malignant neoplasm of upper-outer quadrant of left female breast: Secondary | ICD-10-CM | POA: Diagnosis not present

## 2019-04-27 DIAGNOSIS — T451X5A Adverse effect of antineoplastic and immunosuppressive drugs, initial encounter: Secondary | ICD-10-CM

## 2019-04-27 LAB — CBC WITH DIFFERENTIAL (CANCER CENTER ONLY)
Abs Immature Granulocytes: 0.07 10*3/uL (ref 0.00–0.07)
Basophils Absolute: 0 10*3/uL (ref 0.0–0.1)
Basophils Relative: 0 %
Eosinophils Absolute: 0.1 10*3/uL (ref 0.0–0.5)
Eosinophils Relative: 2 %
HCT: 34.9 % — ABNORMAL LOW (ref 36.0–46.0)
Hemoglobin: 11.3 g/dL — ABNORMAL LOW (ref 12.0–15.0)
Immature Granulocytes: 1 %
Lymphocytes Relative: 16 %
Lymphs Abs: 1.1 10*3/uL (ref 0.7–4.0)
MCH: 31.2 pg (ref 26.0–34.0)
MCHC: 32.4 g/dL (ref 30.0–36.0)
MCV: 96.4 fL (ref 80.0–100.0)
Monocytes Absolute: 0.4 10*3/uL (ref 0.1–1.0)
Monocytes Relative: 7 %
Neutro Abs: 5 10*3/uL (ref 1.7–7.7)
Neutrophils Relative %: 74 %
Platelet Count: 255 10*3/uL (ref 150–400)
RBC: 3.62 MIL/uL — ABNORMAL LOW (ref 3.87–5.11)
RDW: 14.6 % (ref 11.5–15.5)
WBC Count: 6.8 10*3/uL (ref 4.0–10.5)
nRBC: 0 % (ref 0.0–0.2)

## 2019-04-27 LAB — CMP (CANCER CENTER ONLY)
ALT: 17 U/L (ref 0–44)
AST: 17 U/L (ref 15–41)
Albumin: 3.4 g/dL — ABNORMAL LOW (ref 3.5–5.0)
Alkaline Phosphatase: 64 U/L (ref 38–126)
Anion gap: 8 (ref 5–15)
BUN: 12 mg/dL (ref 8–23)
CO2: 27 mmol/L (ref 22–32)
Calcium: 8.7 mg/dL — ABNORMAL LOW (ref 8.9–10.3)
Chloride: 109 mmol/L (ref 98–111)
Creatinine: 0.77 mg/dL (ref 0.44–1.00)
GFR, Est AFR Am: >60 mL/min (ref >60)
GFR, Estimated: >60 mL/min (ref >60)
Glucose, Bld: 110 mg/dL — ABNORMAL HIGH (ref 70–99)
Potassium: 3.9 mmol/L (ref 3.5–5.1)
Sodium: 144 mmol/L (ref 135–145)
Total Bilirubin: 0.8 mg/dL (ref 0.3–1.2)
Total Protein: 6.2 g/dL — ABNORMAL LOW (ref 6.5–8.1)

## 2019-04-27 MED ORDER — SODIUM CHLORIDE 0.9% FLUSH
10.0000 mL | INTRAVENOUS | Status: DC | PRN
  Administered 2019-04-27: 14:00:00 10 mL
  Filled 2019-04-27: qty 10

## 2019-04-27 MED ORDER — HEPARIN SOD (PORK) LOCK FLUSH 100 UNIT/ML IV SOLN
500.0000 [IU] | Freq: Once | INTRAVENOUS | Status: AC | PRN
  Administered 2019-04-27: 14:00:00 500 [IU]
  Filled 2019-04-27: qty 5

## 2019-04-27 MED ORDER — SODIUM CHLORIDE 0.9% FLUSH
10.0000 mL | INTRAVENOUS | Status: DC | PRN
  Administered 2019-04-27: 10 mL
  Filled 2019-04-27: qty 10

## 2019-04-27 NOTE — Assessment & Plan Note (Signed)
Because of worsening neuropathy we will discontinue further chemo.

## 2019-04-27 NOTE — Assessment & Plan Note (Addendum)
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 Adj chemo with DD Adriamycin and Cytoxan followed by Taxol weekly X 12 2. Adj XRT 3. Adj Anti estrogen therapy --------------------------------------------------------------------------------------------- Current treatment:Completed 4 cycles ofdose dense Adriamycin and Cytoxan, today is cycle12Taxol Chemo toxicities:Nausea has resolved. Labs reviewed 1.Chemo induced anemia: Being monitored, today's hemoglobin 2.Monitoring closely for peripheral neuropathy.I reduced the dosage of her chemotherapy with cycle 8.   We were able to finish all 12 cycles of chemotherapy based on stabilization of neuropathy symptoms.   Today's last cycle of chemotherapy. Patient has appointments for radiation oncology for adjuvant radiation. I will see her after radiation is complete and start antiestrogen therapy.

## 2019-04-27 NOTE — Telephone Encounter (Signed)
Left vm for pt to assess needs or questions. Discussed next steps of xrt.

## 2019-05-01 ENCOUNTER — Telehealth: Payer: Self-pay | Admitting: Radiation Oncology

## 2019-05-02 ENCOUNTER — Ambulatory Visit
Admission: RE | Admit: 2019-05-02 | Discharge: 2019-05-02 | Disposition: A | Discharge status: Home / Self Care | Payer: BC Managed Care – PPO | Source: Ambulatory Visit | Attending: Radiation Oncology | Admitting: Radiation Oncology

## 2019-05-02 ENCOUNTER — Other Ambulatory Visit: Payer: Self-pay

## 2019-05-02 ENCOUNTER — Encounter: Payer: Self-pay | Admitting: Radiation Oncology

## 2019-05-02 VITALS — Ht 65.0 in | Wt 260.5 lb

## 2019-05-02 DIAGNOSIS — C50412 Malignant neoplasm of upper-outer quadrant of left female breast: Secondary | ICD-10-CM

## 2019-05-02 DIAGNOSIS — Z170 Estrogen receptor positive status [ER+]: Secondary | ICD-10-CM

## 2019-05-02 NOTE — Progress Notes (Signed)
See progress note under physician encounter. 

## 2019-05-02 NOTE — Progress Notes (Signed)
Radiation Oncology         [phone] ________________________________  Outpatient Follow Up - Conducted via telephone due to current COVID-19 concerns for limiting patient exposure  I spoke with the patient to conduct this consult visit via telephone to spare the patient unnecessary potential exposure in the healthcare setting during the current COVID-19 pandemic. The patient was notified in advance and was offered a [place] meeting to allow for face to face communication but unfortunately reported that they did not have the appropriate resources/technology to support such a visit and instead preferred to proceed with a telephone visit.  ________________________________  Name: Cosette Prindle        MRN: [id number]  Date of Service: 05/02/2019 DOB: [birth year]  IO:NGEXB Chase, Alferd Apa, DO  Nicholas Lose, MD     REFERRING PHYSICIAN: Nicholas Lose, MD   DIAGNOSIS: The encounter diagnosis was Malignant neoplasm of upper-outer quadrant of left breast in female, estrogen receptor positive ([place]).   HISTORY OF PRESENT ILLNESS: Delmi Fulfer is a 63 y.o. female originally seen in the multidisciplinary breast clinic for a new diagnosis of left breast cancer. The patient was noted to have a left mass on screening mammogram and she has a history of benign breast biopsy. She underwent diagnostic imaging and this reveled a 6 x 5 x 5 mm mass at 2:30. She had one abnormal appearing lymph node and also had some stable appearing cystic and calcification sites in the breast. 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 Her suspicious node was also biopsied and consistent with carcinoma. Her tumor is ER/PR positive, HER2 negative, Ki 67 of 40%. She underwent left lumpectomy with sentinel node biopsy on 10/11/2018 which revealed a 1 cm grade 2 invasive ductal carcinoma with associated DCIS. Her margins were negative for the invasive component, but the DCIS  was focally 1 mm from the superior margin, and 2/2 nodes were positive. She underwent axillary lymph node dissection on 11/09/2018 which revealed 3 positive nodes out of 20 removed. The largest focus of cancer measured 5 mm without extranodal extension. She proceeded with adjuvant chemotherapy between 12/15/2018-04/20/2019. She plans to have her PAC removed from the right chest on 05/23/2019. She is contacted by phone to discuss adjuvant radiotherapy.  PREVIOUS RADIATION THERAPY: No   PAST MEDICAL HISTORY:  Past Medical History:  Diagnosis Date   Complication of anesthesia    Family history of breast cancer    Heart murmur    no problems, saw cardiologist 2010   Hypertension    on meds   Hypothyroidism    Osteoarthritis    Plantar fasciitis    PONV (postoperative nausea and vomiting)    Thyroid disease    Hypothyroidism       PAST SURGICAL HISTORY: Past Surgical History:  Procedure Laterality Date   ABDOMINAL HYSTERECTOMY  02/2012   AXILLARY LYMPH NODE DISSECTION Left 11/09/2018   Procedure: LEFT AXILLARY LYMPH NODE DISSECTION;  Surgeon: Erroll Luna, MD;  Location: [place];  Service: General;  Laterality: Left;   BREAST LUMPECTOMY WITH RADIOACTIVE SEED AND SENTINEL LYMPH NODE BIOPSY Left 10/11/2018   Procedure: LEFT BREAST LUMPECTOMY WITH RADIOACTIVE SEED AND LEFT SENTINEL LYMPH NODE MAPPING WITH LEFT TARGETED LYMPH NODE BIOPSY;  Surgeon: Erroll Luna, MD;  Location: [place];  Service: General;  Laterality: Left;   FOOT SURGERY Left    bone spurs   [place]  PLACEMENT Right 12/01/2018   Procedure: INSERTION PORT-A-CATH WITH ULTRASOUND;  Surgeon: Erroll Luna, MD;  Location: [place];  Service: General;  Laterality: Right;     FAMILY HISTORY:  Family History  Problem Relation Age of Onset   Diabetes Mother    Hyperlipidemia Mother    Hypertension Mother    Hypertension  Father    Heart disease Father 63       ?MI   Hypothyroidism Sister    Hyperthyroidism Sister    Breast cancer Maternal Aunt        dx 60's/70's   Breast cancer Other        dx early 35's     SOCIAL HISTORY:  reports that she has never smoked. She has never used smokeless tobacco. She reports current alcohol use. She reports that she does not use drugs. The patient is single and lives in [place].     ALLERGIES: Adhesive [tape], Erythromycin, Lisinopril, and Penicillins   MEDICATIONS:  Current Outpatient Medications  Medication Sig Dispense Refill   amLODipine (NORVASC) 5 MG tablet Take 1 tablet (5 mg total) by mouth daily. Needs ov before any more refills 90 tablet 0   carvedilol (COREG) 25 MG tablet TAKE 1 TABLET(25 MG) BY MOUTH TWICE DAILY WITH A MEAL. Needs ov before any more refills 180 tablet 0   EPINEPHRINE 0.3 mg/0.3 mL IJ SOAJ injection INJECT 0.3 ML(1 SYRINGE) IN THE MUSCLE AS NEEDED FOR ALLERGIC REACTION 2 Device 0   furosemide (LASIX) 20 MG tablet Needs ov before any more refills 90 tablet 0   gabapentin (NEURONTIN) 300 MG capsule Take 1 capsule (300 mg total) by mouth at bedtime. 30 capsule 2   lidocaine-prilocaine (EMLA) cream Apply to affected area once 30 g 3   LORazepam (ATIVAN) 0.5 MG tablet TAKE 1 TABLET(0.5 MG) BY MOUTH AT BEDTIME AS NEEDED FOR SLEEP 30 tablet 1   meloxicam (MOBIC) 15 MG tablet TAKE 1 TABLET(15 MG) BY MOUTH DAILY AS NEEDED FOR PAIN 90 tablet 3   metroNIDAZOLE (METROGEL) 0.75 % gel Apply 1 application topically 2 (two) times daily. 45 g 2   ondansetron (ZOFRAN) 8 MG tablet Take 1 tablet (8 mg total) by mouth 2 (two) times daily as needed. Start on the third day after chemotherapy. 30 tablet 1   prochlorperazine (COMPAZINE) 10 MG tablet TAKE 1 TABLET(10 MG) BY MOUTH EVERY 6 HOURS AS NEEDED FOR NAUSEA OR VOMITING 30 tablet 1   SYNTHROID 100 MCG tablet 1 po qd 90 tablet 3   valACYclovir (VALTREX) 1000 MG tablet TAKE 1 TABLET BY  MOUTH THREE TIMES DAILY, DISCONTINUE ALL PREVIOUS REFILLS FOR THIS MEDICATION 30 tablet 5   No current facility-administered medications for this encounter.      REVIEW OF SYSTEMS: On review of systems, the patient reports that she is doing well overall. She feels like she did well in tolerating her systemic therapy and is well healed from surgery. She denies any chest pain, shortness of breath, cough, fevers, chills, night sweats, unintended weight changes. She denies any bowel or bladder disturbances, and denies abdominal pain, nausea or vomiting. She denies any new musculoskeletal or joint aches or pains. A complete review of systems is obtained and is otherwise negative.     PHYSICAL EXAM:   Unable to assess due to encounter type   ECOG = 0  0 - Asymptomatic (Fully active, able to carry on all predisease activities without restriction)  1 - Symptomatic but completely ambulatory (Restricted  in physically strenuous activity but ambulatory and able to carry out work of a light or sedentary nature. For example, light housework, office work)  2 - Symptomatic, <50% in bed during the day (Ambulatory and capable of all self care but unable to carry out any work activities. Up and about more than 50% of waking hours)  3 - Symptomatic, >50% in bed, but not bedbound (Capable of only limited self-care, confined to bed or chair 50% or more of waking hours)  4 - Bedbound (Completely disabled. Cannot carry on any self-care. Totally confined to bed or chair)  5 - Death   Eustace Pen MM, Creech RH, Tormey DC, et al. [phone]). "Toxicity and response criteria of the [facility]". [place] Oncol. 5 (6): 649-55    LABORATORY DATA:  Lab Results  Component Value Date   WBC 6.8 04/27/2019   HGB 11.3 (L) 04/27/2019   HCT 34.9 (L) 04/27/2019   MCV 96.4 04/27/2019   PLT 255 04/27/2019   Lab Results  Component Value Date   NA 144 04/27/2019   K 3.9 04/27/2019   CL 109  04/27/2019   CO2 27 04/27/2019   Lab Results  Component Value Date   ALT 17 04/27/2019   AST 17 04/27/2019   ALKPHOS 64 04/27/2019   BILITOT 0.8 04/27/2019      RADIOGRAPHY: No results found.     IMPRESSION/PLAN: 1. 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 Dr. Lisbeth Renshaw discusses the final pathology findings and reviews the nature of node positive breast disease. She would benefit from external radiotherapy to the breast and regional nodes followed by antiestrogen therapy. We discussed the risks, benefits, short, and long term effects of radiotherapy, and the patient is interested in proceeding. Dr. Lisbeth Renshaw discusses the delivery and logistics of radiotherapy and anticipates a course of 6 1/2 weeks of radiotherapy with deep inspiration breath hold. She is interested in holding off with treatment until after having her PAC removed, and will come in for simulation on 05/18/2019, with plans to begin treatment the week of 05/29/2019.     Given current concerns for patient exposure during the COVID-19 pandemic, this encounter was conducted via telephone.  The patient has given verbal consent for this type of encounter. The time spent during this encounter was 25 minutes and 50% of that time was spent in the coordination of her care. The attendants for this meeting included Joaquim Lai, RN, Dr. Lisbeth Renshaw, Shona Simpson, [facility] and Blanchard Mane  During the encounter, Joaquim Lai, RN, Dr. Lisbeth Renshaw, and Shona Simpson [facility] were located at [facility] Radiation Oncology Department.  Marialuisa Basara  was located at home.   The above documentation reflects my direct findings during this shared patient visit. Please see the separate note by Dr. Lisbeth Renshaw on this date for the remainder of the patient's plan of care.    Carola Rhine, PAC

## 2019-05-04 ENCOUNTER — Encounter: Payer: Self-pay | Admitting: *Deleted

## 2019-05-04 ENCOUNTER — Ambulatory Visit: Payer: BC Managed Care – PPO | Admitting: Radiation Oncology

## 2019-05-16 ENCOUNTER — Other Ambulatory Visit: Payer: Self-pay

## 2019-05-16 ENCOUNTER — Encounter (HOSPITAL_BASED_OUTPATIENT_CLINIC_OR_DEPARTMENT_OTHER): Payer: Self-pay | Admitting: *Deleted

## 2019-05-18 ENCOUNTER — Ambulatory Visit: Payer: BC Managed Care – PPO | Admitting: Radiation Oncology

## 2019-05-19 ENCOUNTER — Other Ambulatory Visit: Payer: Self-pay

## 2019-05-19 ENCOUNTER — Encounter (HOSPITAL_BASED_OUTPATIENT_CLINIC_OR_DEPARTMENT_OTHER)
Admission: RE | Admit: 2019-05-19 | Discharge: 2019-05-19 | Disposition: A | Discharge status: Home / Self Care | Payer: BC Managed Care – PPO | Source: Ambulatory Visit | Attending: Surgery | Admitting: Surgery

## 2019-05-19 ENCOUNTER — Other Ambulatory Visit (HOSPITAL_COMMUNITY)
Admission: RE | Admit: 2019-05-19 | Discharge: 2019-05-19 | Disposition: A | Discharge status: Home / Self Care | Payer: BC Managed Care – PPO | Source: Ambulatory Visit | Attending: Surgery | Admitting: Surgery

## 2019-05-19 DIAGNOSIS — E669 Obesity, unspecified: Secondary | ICD-10-CM | POA: Diagnosis not present

## 2019-05-19 DIAGNOSIS — Z9221 Personal history of antineoplastic chemotherapy: Secondary | ICD-10-CM | POA: Diagnosis not present

## 2019-05-19 DIAGNOSIS — E039 Hypothyroidism, unspecified: Secondary | ICD-10-CM | POA: Diagnosis not present

## 2019-05-19 DIAGNOSIS — Z20828 Contact with and (suspected) exposure to other viral communicable diseases: Secondary | ICD-10-CM | POA: Insufficient documentation

## 2019-05-19 DIAGNOSIS — Z79899 Other long term (current) drug therapy: Secondary | ICD-10-CM | POA: Diagnosis not present

## 2019-05-19 DIAGNOSIS — M1990 Unspecified osteoarthritis, unspecified site: Secondary | ICD-10-CM | POA: Diagnosis not present

## 2019-05-19 DIAGNOSIS — Z79890 Hormone replacement therapy (postmenopausal): Secondary | ICD-10-CM | POA: Diagnosis not present

## 2019-05-19 DIAGNOSIS — Z452 Encounter for adjustment and management of vascular access device: Secondary | ICD-10-CM | POA: Diagnosis present

## 2019-05-19 DIAGNOSIS — Z853 Personal history of malignant neoplasm of breast: Secondary | ICD-10-CM | POA: Diagnosis not present

## 2019-05-19 DIAGNOSIS — Z6841 Body Mass Index (BMI) 40.0 and over, adult: Secondary | ICD-10-CM | POA: Diagnosis not present

## 2019-05-19 DIAGNOSIS — Z8249 Family history of ischemic heart disease and other diseases of the circulatory system: Secondary | ICD-10-CM | POA: Diagnosis not present

## 2019-05-19 DIAGNOSIS — Z01812 Encounter for preprocedural laboratory examination: Secondary | ICD-10-CM | POA: Diagnosis not present

## 2019-05-19 DIAGNOSIS — I10 Essential (primary) hypertension: Secondary | ICD-10-CM | POA: Diagnosis not present

## 2019-05-19 DIAGNOSIS — Z791 Long term (current) use of non-steroidal anti-inflammatories (NSAID): Secondary | ICD-10-CM | POA: Diagnosis not present

## 2019-05-19 LAB — CBC WITH DIFFERENTIAL/PLATELET
Abs Immature Granulocytes: 0.03 10*3/uL (ref 0.00–0.07)
Basophils Absolute: 0 10*3/uL (ref 0.0–0.1)
Basophils Relative: 1 %
Eosinophils Absolute: 0.2 10*3/uL (ref 0.0–0.5)
Eosinophils Relative: 3 %
HCT: 37.3 % (ref 36.0–46.0)
Hemoglobin: 12 g/dL (ref 12.0–15.0)
Immature Granulocytes: 0 %
Lymphocytes Relative: 14 %
Lymphs Abs: 1.1 10*3/uL (ref 0.7–4.0)
MCH: 29.8 pg (ref 26.0–34.0)
MCHC: 32.2 g/dL (ref 30.0–36.0)
MCV: 92.6 fL (ref 80.0–100.0)
Monocytes Absolute: 0.6 10*3/uL (ref 0.1–1.0)
Monocytes Relative: 7 %
Neutro Abs: 6.1 10*3/uL (ref 1.7–7.7)
Neutrophils Relative %: 75 %
Platelets: 251 10*3/uL (ref 150–400)
RBC: 4.03 MIL/uL (ref 3.87–5.11)
RDW: 14 % (ref 11.5–15.5)
WBC: 8.1 10*3/uL (ref 4.0–10.5)
nRBC: 0 % (ref 0.0–0.2)

## 2019-05-19 LAB — COMPREHENSIVE METABOLIC PANEL
ALT: 21 U/L (ref 0–44)
AST: 21 U/L (ref 15–41)
Albumin: 3.2 g/dL — ABNORMAL LOW (ref 3.5–5.0)
Alkaline Phosphatase: 59 U/L (ref 38–126)
Anion gap: 10 (ref 5–15)
BUN: 10 mg/dL (ref 8–23)
CO2: 22 mmol/L (ref 22–32)
Calcium: 8.9 mg/dL (ref 8.9–10.3)
Chloride: 110 mmol/L (ref 98–111)
Creatinine, Ser: 0.68 mg/dL (ref 0.44–1.00)
GFR calc Af Amer: >60 mL/min (ref >60)
GFR calc non Af Amer: >60 mL/min (ref >60)
Glucose, Bld: 109 mg/dL — ABNORMAL HIGH (ref 70–99)
Potassium: 4.2 mmol/L (ref 3.5–5.1)
Sodium: 142 mmol/L (ref 135–145)
Total Bilirubin: 0.7 mg/dL (ref 0.3–1.2)
Total Protein: 6.1 g/dL — ABNORMAL LOW (ref 6.5–8.1)

## 2019-05-19 MED ORDER — CHLORHEXIDINE GLUCONATE CLOTH 2 % EX PADS: 6.0000 | MEDICATED_PAD | Freq: Once | CUTANEOUS | Status: DC

## 2019-05-19 NOTE — Progress Notes (Signed)

## 2019-05-21 LAB — NOVEL CORONAVIRUS, NAA (HOSP ORDER, SEND-OUT TO REF LAB; TAT 18-24 HRS): SARS-CoV-2, NAA: NOT DETECTED

## 2019-05-22 NOTE — Anesthesia Preprocedure Evaluation (Addendum)
Anesthesia Evaluation  Patient identified by MRN, date of birth, ID band Patient awake    Reviewed: Allergy & Precautions, NPO status , Patient's Chart, lab work & pertinent test results  History of Anesthesia Complications (+) PONV and history of anesthetic complications  Airway Mallampati: III       Dental no notable dental hx. (+) Teeth Intact   Pulmonary neg pulmonary ROS,    Pulmonary exam normal breath sounds clear to auscultation       Cardiovascular hypertension, Pt. on home beta blockers and Pt. on medications Normal cardiovascular exam Rhythm:Regular Rate:Normal     Neuro/Psych negative psych ROS   GI/Hepatic negative GI ROS, Neg liver ROS,   Endo/Other  Hypothyroidism Morbid obesity  Renal/GU negative Renal ROS     Musculoskeletal   Abdominal (+) + obese,   Peds  Hematology negative hematology ROS (+)   Anesthesia Other Findings   Reproductive/Obstetrics                            Anesthesia Physical Anesthesia Plan  ASA: III  Anesthesia Plan: MAC   Post-op Pain Management:    Induction:   PONV Risk Score and Plan: 3 and Ondansetron, Dexamethasone, Propofol infusion and Midazolam  Airway Management Planned: Nasal Cannula, Natural Airway and Simple Face Mask  Additional Equipment: None  Intra-op Plan:   Post-operative Plan:   Informed Consent: I have reviewed the patients History and Physical, chart, labs and discussed the procedure including the risks, benefits and alternatives for the proposed anesthesia with the patient or authorized representative who has indicated his/her understanding and acceptance.       Plan Discussed with: CRNA  Anesthesia Plan Comments:        Anesthesia Quick Evaluation

## 2019-05-23 ENCOUNTER — Other Ambulatory Visit: Payer: Self-pay

## 2019-05-23 ENCOUNTER — Encounter (HOSPITAL_BASED_OUTPATIENT_CLINIC_OR_DEPARTMENT_OTHER): Payer: Self-pay

## 2019-05-23 ENCOUNTER — Ambulatory Visit (HOSPITAL_BASED_OUTPATIENT_CLINIC_OR_DEPARTMENT_OTHER): Payer: BC Managed Care – PPO | Admitting: Anesthesiology

## 2019-05-23 ENCOUNTER — Ambulatory Visit (HOSPITAL_BASED_OUTPATIENT_CLINIC_OR_DEPARTMENT_OTHER)
Admission: RE | Admit: 2019-05-23 | Discharge: 2019-05-23 | Disposition: A | Discharge status: Home / Self Care | Payer: BC Managed Care – PPO | Attending: Surgery | Admitting: Surgery

## 2019-05-23 ENCOUNTER — Encounter (HOSPITAL_BASED_OUTPATIENT_CLINIC_OR_DEPARTMENT_OTHER)
Admission: RE | Disposition: A | Discharge status: Home / Self Care | Payer: Self-pay | Source: Home / Self Care | Attending: Surgery

## 2019-05-23 DIAGNOSIS — Z79890 Hormone replacement therapy (postmenopausal): Secondary | ICD-10-CM | POA: Insufficient documentation

## 2019-05-23 DIAGNOSIS — Z791 Long term (current) use of non-steroidal anti-inflammatories (NSAID): Secondary | ICD-10-CM | POA: Insufficient documentation

## 2019-05-23 DIAGNOSIS — Z6841 Body Mass Index (BMI) 40.0 and over, adult: Secondary | ICD-10-CM | POA: Insufficient documentation

## 2019-05-23 DIAGNOSIS — M1990 Unspecified osteoarthritis, unspecified site: Secondary | ICD-10-CM | POA: Insufficient documentation

## 2019-05-23 DIAGNOSIS — Z452 Encounter for adjustment and management of vascular access device: Secondary | ICD-10-CM | POA: Diagnosis not present

## 2019-05-23 DIAGNOSIS — Z853 Personal history of malignant neoplasm of breast: Secondary | ICD-10-CM | POA: Insufficient documentation

## 2019-05-23 DIAGNOSIS — Z79899 Other long term (current) drug therapy: Secondary | ICD-10-CM | POA: Insufficient documentation

## 2019-05-23 DIAGNOSIS — I10 Essential (primary) hypertension: Secondary | ICD-10-CM | POA: Insufficient documentation

## 2019-05-23 DIAGNOSIS — Z9221 Personal history of antineoplastic chemotherapy: Secondary | ICD-10-CM | POA: Insufficient documentation

## 2019-05-23 DIAGNOSIS — E669 Obesity, unspecified: Secondary | ICD-10-CM | POA: Insufficient documentation

## 2019-05-23 DIAGNOSIS — E039 Hypothyroidism, unspecified: Secondary | ICD-10-CM | POA: Insufficient documentation

## 2019-05-23 DIAGNOSIS — Z8249 Family history of ischemic heart disease and other diseases of the circulatory system: Secondary | ICD-10-CM | POA: Insufficient documentation

## 2019-05-23 HISTORY — PX: PORT-A-CATH REMOVAL: SHX5289

## 2019-05-23 SURGERY — REMOVAL PORT-A-CATH
Anesthesia: Monitor Anesthesia Care | Site: Chest

## 2019-05-23 MED ORDER — PROMETHAZINE HCL 25 MG/ML IJ SOLN: 6.2500 mg | INTRAMUSCULAR | Status: DC | PRN

## 2019-05-23 MED ORDER — LIDOCAINE HCL (CARDIAC) PF 100 MG/5ML IV SOSY
PREFILLED_SYRINGE | INTRAVENOUS | Status: DC | PRN
  Administered 2019-05-23: 60 mg via INTRATRACHEAL

## 2019-05-23 MED ORDER — FENTANYL CITRATE (PF) 100 MCG/2ML IJ SOLN: 25.0000 ug | INTRAMUSCULAR | Status: DC | PRN

## 2019-05-23 MED ORDER — BUPIVACAINE-EPINEPHRINE 0.25% -1:200000 IJ SOLN
INTRAMUSCULAR | Status: DC | PRN
  Administered 2019-05-23: 11 mL

## 2019-05-23 MED ORDER — OXYCODONE HCL 5 MG PO TABS: 5.0000 mg | ORAL_TABLET | Freq: Once | ORAL | Status: DC | PRN

## 2019-05-23 MED ORDER — ACETAMINOPHEN 325 MG PO TABS: 325.0000 mg | ORAL_TABLET | ORAL | Status: DC | PRN

## 2019-05-23 MED ORDER — ONDANSETRON HCL 4 MG/2ML IJ SOLN
INTRAMUSCULAR | Status: DC | PRN
  Administered 2019-05-23: 4 mg via INTRAVENOUS

## 2019-05-23 MED ORDER — MEPERIDINE HCL 25 MG/ML IJ SOLN: 6.2500 mg | INTRAMUSCULAR | Status: DC | PRN

## 2019-05-23 MED ORDER — PROPOFOL 10 MG/ML IV BOLUS
INTRAVENOUS | Status: AC
  Filled 2019-05-23: qty 60

## 2019-05-23 MED ORDER — CLINDAMYCIN PHOSPHATE 900 MG/50ML IV SOLN
900.0000 mg | INTRAVENOUS | Status: AC
  Administered 2019-05-23: 900 mg via INTRAVENOUS

## 2019-05-23 MED ORDER — PROPOFOL 500 MG/50ML IV EMUL
INTRAVENOUS | Status: DC | PRN
  Administered 2019-05-23: 100 ug/kg/min via INTRAVENOUS

## 2019-05-23 MED ORDER — IBUPROFEN 800 MG PO TABS: 800.0000 mg | ORAL_TABLET | Freq: Three times a day (TID) | ORAL | 0 refills | Status: DC | PRN

## 2019-05-23 MED ORDER — CLINDAMYCIN PHOSPHATE 900 MG/50ML IV SOLN
INTRAVENOUS | Status: AC
  Filled 2019-05-23: qty 50

## 2019-05-23 MED ORDER — ACETAMINOPHEN 160 MG/5ML PO SOLN: 325.0000 mg | ORAL | Status: DC | PRN

## 2019-05-23 MED ORDER — LACTATED RINGERS IV SOLN
INTRAVENOUS | Status: DC
  Administered 2019-05-23: 07:00:00 via INTRAVENOUS

## 2019-05-23 MED ORDER — BUPIVACAINE-EPINEPHRINE (PF) 0.5% -1:200000 IJ SOLN
INTRAMUSCULAR | Status: AC
  Filled 2019-05-23: qty 180

## 2019-05-23 MED ORDER — OXYCODONE HCL 5 MG/5ML PO SOLN: 5.0000 mg | Freq: Once | ORAL | Status: DC | PRN

## 2019-05-23 MED ORDER — KETOROLAC TROMETHAMINE 30 MG/ML IJ SOLN: 30.0000 mg | Freq: Once | INTRAMUSCULAR | Status: DC | PRN

## 2019-05-23 SURGICAL SUPPLY — 36 items
BENZOIN TINCTURE PRP APPL 2/3 (GAUZE/BANDAGES/DRESSINGS) IMPLANT
BLADE SURG 15 STRL LF DISP TIS (BLADE) ×1 IMPLANT
BLADE SURG 15 STRL SS (BLADE) ×2
CHLORAPREP W/TINT 26 (MISCELLANEOUS) ×3 IMPLANT
CLOSURE WOUND 1/2 X4 (GAUZE/BANDAGES/DRESSINGS)
COVER BACK TABLE REUSABLE LG (DRAPES) ×3 IMPLANT
COVER MAYO STAND REUSABLE (DRAPES) ×3 IMPLANT
COVER WAND RF STERILE (DRAPES) IMPLANT
DECANTER SPIKE VIAL GLASS SM (MISCELLANEOUS) ×1 IMPLANT
DERMABOND ADVANCED (GAUZE/BANDAGES/DRESSINGS) ×2
DERMABOND ADVANCED .7 DNX12 (GAUZE/BANDAGES/DRESSINGS) ×1 IMPLANT
DRAPE LAPAROTOMY 100X72 PEDS (DRAPES) ×3 IMPLANT
DRAPE UTILITY XL STRL (DRAPES) ×3 IMPLANT
ELECT REM PT RETURN 9FT ADLT (ELECTROSURGICAL)
ELECTRODE REM PT RTRN 9FT ADLT (ELECTROSURGICAL) ×1 IMPLANT
GLOVE BIO SURGEON STRL SZ 6.5 (GLOVE) ×1 IMPLANT
GLOVE BIO SURGEONS STRL SZ 6.5 (GLOVE) ×1
GLOVE BIOGEL PI IND STRL 7.0 (GLOVE) IMPLANT
GLOVE BIOGEL PI IND STRL 8 (GLOVE) ×1 IMPLANT
GLOVE BIOGEL PI INDICATOR 7.0 (GLOVE) ×2
GLOVE BIOGEL PI INDICATOR 8 (GLOVE) ×2
GLOVE ECLIPSE 8.0 STRL XLNG CF (GLOVE) ×3 IMPLANT
GOWN STRL REUS W/ TWL LRG LVL3 (GOWN DISPOSABLE) ×2 IMPLANT
GOWN STRL REUS W/TWL LRG LVL3 (GOWN DISPOSABLE) ×4
NDL HYPO 25X1 1.5 SAFETY (NEEDLE) ×1 IMPLANT
NEEDLE HYPO 25X1 1.5 SAFETY (NEEDLE) ×3 IMPLANT
NS IRRIG 1000ML POUR BTL (IV SOLUTION) ×1 IMPLANT
PACK BASIN DAY SURGERY FS (CUSTOM PROCEDURE TRAY) ×3 IMPLANT
PENCIL BUTTON HOLSTER BLD 10FT (ELECTRODE) IMPLANT
SLEEVE SCD COMPRESS KNEE MED (MISCELLANEOUS) IMPLANT
SPONGE LAP 4X18 RFD (DISPOSABLE) ×3 IMPLANT
STRIP CLOSURE SKIN 1/2X4 (GAUZE/BANDAGES/DRESSINGS) IMPLANT
SUT MON AB 4-0 PC3 18 (SUTURE) ×3 IMPLANT
SUT VICRYL 3-0 CR8 SH (SUTURE) ×3 IMPLANT
SYR CONTROL 10ML LL (SYRINGE) ×3 IMPLANT
TOWEL GREEN STERILE FF (TOWEL DISPOSABLE) ×3 IMPLANT

## 2019-05-23 NOTE — H&P (Signed)
Kayla Banks is an 63 y.o. female.   Chief Complaint: port in place HPI: Pt presents for port removal.  She has completed chemotherapy for breast cancer   Past Medical History:  Diagnosis Date  . Complication of anesthesia   . Family history of breast cancer   . Heart murmur    no problems, saw cardiologist 2010  . Hypertension    on meds  . Hypothyroidism   . Osteoarthritis   . Plantar fasciitis   . PONV (postoperative nausea and vomiting)   . Thyroid disease    Hypothyroidism    Past Surgical History:  Procedure Laterality Date  . ABDOMINAL HYSTERECTOMY  02/2012  . AXILLARY LYMPH NODE DISSECTION Left 11/09/2018   Procedure: LEFT AXILLARY LYMPH NODE DISSECTION;  Surgeon: Erroll Luna, MD;  Location: [place];  Service: General;  Laterality: Left;  . BREAST LUMPECTOMY WITH RADIOACTIVE SEED AND SENTINEL LYMPH NODE BIOPSY Left 10/11/2018   Procedure: LEFT BREAST LUMPECTOMY WITH RADIOACTIVE SEED AND LEFT SENTINEL LYMPH NODE MAPPING WITH LEFT TARGETED LYMPH NODE BIOPSY;  Surgeon: Erroll Luna, MD;  Location: [place];  Service: General;  Laterality: Left;  . FOOT SURGERY Left    bone spurs  . LUMBAR LAMINECTOMY  1990  . PORTACATH PLACEMENT Right 12/01/2018   Procedure: INSERTION PORT-A-CATH WITH ULTRASOUND;  Surgeon: Erroll Luna, MD;  Location: [place];  Service: General;  Laterality: Right;    Family History  Problem Relation Age of Onset  . Diabetes Mother   . Hyperlipidemia Mother   . Hypertension Mother   . Hypertension Father   . Heart disease Father 26       ?MI  . Hypothyroidism Sister   . Hyperthyroidism Sister   . Breast cancer Maternal Aunt        dx 60's/70's  . Breast cancer Other        dx early 58's   Social History:  reports that she has never smoked. She has never used smokeless tobacco. She reports current alcohol use. She reports that she does not use drugs.  Allergies:  Allergies   Allergen Reactions  . Erythromycin Swelling    Lip swelling; angioedema  . Lisinopril Swelling    REACTION: angioedema  . Penicillins Hives and Swelling    Swelling in arms & hands  . Adhesive [Tape] Rash    Medications Prior to Admission  Medication Sig Dispense Refill  . amLODipine (NORVASC) 5 MG tablet Take 1 tablet (5 mg total) by mouth daily. Needs ov before any more refills 90 tablet 0  . carvedilol (COREG) 25 MG tablet TAKE 1 TABLET(25 MG) BY MOUTH TWICE DAILY WITH A MEAL. Needs ov before any more refills 180 tablet 0  . furosemide (LASIX) 20 MG tablet Needs ov before any more refills 90 tablet 0  . gabapentin (NEURONTIN) 300 MG capsule Take 1 capsule (300 mg total) by mouth at bedtime. 30 capsule 2  . LORazepam (ATIVAN) 0.5 MG tablet TAKE 1 TABLET(0.5 MG) BY MOUTH AT BEDTIME AS NEEDED FOR SLEEP 30 tablet 1  . meloxicam (MOBIC) 15 MG tablet TAKE 1 TABLET(15 MG) BY MOUTH DAILY AS NEEDED FOR PAIN 90 tablet 3  . metroNIDAZOLE (METROGEL) 0.75 % gel Apply 1 application topically 2 (two) times daily. 45 g 2  . SYNTHROID 100 MCG tablet 1 po qd 90 tablet 3  . EPINEPHRINE 0.3 mg/0.3 mL IJ SOAJ injection INJECT 0.3 ML(1 SYRINGE) IN THE MUSCLE AS NEEDED FOR ALLERGIC REACTION  2 Device 0  . valACYclovir (VALTREX) 1000 MG tablet TAKE 1 TABLET BY MOUTH THREE TIMES DAILY, DISCONTINUE ALL PREVIOUS REFILLS FOR THIS MEDICATION (Patient taking differently: as needed. ) 30 tablet 5    No results found for this or any previous visit (from the past 48 hour(s)). No results found.  Review of Systems  All other systems reviewed and are negative.   Blood pressure 129/67, pulse 80, temperature 98.6 F (37 C), temperature source Oral, resp. rate 16, height 5\' 6"  (1.676 m), weight 118.7 kg, last menstrual period 01/28/2012, SpO2 100 %. Physical Exam  Constitutional: She appears well-developed.  HENT:  Head: Normocephalic.  Eyes: Pupils are equal, round, and reactive to light.  Neck: Normal range of  motion.  Cardiovascular: Normal rate.  Respiratory: Effort normal.  R Comfort port   Musculoskeletal: Normal range of motion.  Neurological: She is alert.  Skin: Skin is warm.  Psychiatric: She has a normal mood and affect. Her behavior is normal.     Assessment/Plan    Port in place  Remove port   The procedure has been discussed with the patient.  Alternative therapies have been discussed with the patient.  Operative risks include bleeding,  Infection,  Organ injury,  Nerve injury,  Blood vessel injury,  DVT,  Pulmonary embolism,  Death,  And possible reoperation.  Medical management risks include worsening of present situation.  The success of the procedure is 50 -90 % at treating patients symptoms.  The patient understands and agrees to proceed.  Turner Daniels, MD 05/23/2019, 7:11 AM

## 2019-05-23 NOTE — Anesthesia Procedure Notes (Signed)
Procedure Name: MAC Date/Time: 05/23/2019 7:28 AM Performed by: Oletta Lamas, CRNA Pre-anesthesia Checklist: Patient identified, Emergency Drugs available, Suction available, Patient being monitored and Timeout performed Patient Re-evaluated:Patient Re-evaluated prior to induction Oxygen Delivery Method: Simple face mask

## 2019-05-23 NOTE — Discharge Instructions (Signed)
GENERAL SURGERY: POST OP INSTRUCTIONS  ######################################################################  EAT Gradually transition to a high fiber diet with a fiber supplement over the next few weeks after discharge.  Start with a pureed / full liquid diet (see below)  WALK Walk an hour a day.  Control your pain to do that.    CONTROL PAIN Control pain so that you can walk, sleep, tolerate sneezing/coughing, go up/down stairs.  HAVE A BOWEL MOVEMENT DAILY Keep your bowels regular to avoid problems.  OK to try a laxative to override constipation.  OK to use an antidairrheal to slow down diarrhea.  Call if not better after 2 tries  CALL IF YOU HAVE PROBLEMS/CONCERNS Call if you are still struggling despite following these instructions. Call if you have concerns not answered by these instructions  ######################################################################    1. DIET: Follow a light bland diet & liquids the first 24 hours after arrival home, such as soup, liquids, starches, etc.  Be sure to drink plenty of fluids.  Quickly advance to a usual solid diet within a few days.  Avoid fast food or heavy meals as your are more likely to get nauseated or have irregular bowels.  A low-fat, high-fiber diet for the rest of your life is ideal.   2. Take your usually prescribed home medications unless otherwise directed. 3. PAIN CONTROL: a. Pain is best controlled by a usual combination of three different methods TOGETHER: i. Ice/Heat ii. Over the counter pain medication iii. Prescription pain medication b. Most patients will experience some swelling and bruising around the incisions.  Ice packs or heating pads (30-60 minutes up to 6 times a day) will help. Use ice for the first few days to help decrease swelling and bruising, then switch to heat to help relax tight/sore spots and speed recovery.  Some people prefer to use ice alone, heat alone, alternating between ice & heat.   Experiment to what works for you.  Swelling and bruising can take several weeks to resolve.   c. It is helpful to take an over-the-counter pain medication regularly for the first few weeks.  Choose one of the following that works best for you: i. Naproxen (Aleve, etc)  Two 220mg tabs twice a day ii. Ibuprofen (Advil, etc) Three 200mg tabs four times a day (every meal & bedtime) iii. Acetaminophen (Tylenol, etc) 500-650mg four times a day (every meal & bedtime) d. A  prescription for pain medication (such as oxycodone, hydrocodone, etc) should be given to you upon discharge.  Take your pain medication as prescribed.  i. If you are having problems/concerns with the prescription medicine (does not control pain, nausea, vomiting, rash, itching, etc), please call us [phone] to see if we need to switch you to a different pain medicine that will work better for you and/or control your side effect better. ii. If you need a refill on your pain medication, please contact your pharmacy.  They will contact our office to request authorization. Prescriptions will not be filled after 5 pm or on week-ends. 4. Avoid getting constipated.  Between the surgery and the pain medications, it is common to experience some constipation.  Increasing fluid intake and taking a fiber supplement (such as Metamucil, Citrucel, FiberCon, MiraLax, etc) 1-2 times a day regularly will usually help prevent this problem from occurring.  A mild laxative (prune juice, Milk of Magnesia, MiraLax, etc) should be taken according to package directions if there are no bowel movements after 48 hours.   5. Wash /   shower every day.  You may shower over the dressings as they are waterproof.  Continue to shower over incision(s) after the dressing is off. 6. Remove your waterproof bandages 5 days after surgery.  You may leave the incision open to air.  You may have skin tapes (Steri Strips) covering the incision(s).  Leave them on until one week, then  remove.  You may replace a dressing/Band-Aid to cover the incision for comfort if you wish.      7. ACTIVITIES as tolerated:   a. You may resume regular (light) daily activities beginning the next day--such as daily self-care, walking, climbing stairs--gradually increasing activities as tolerated.  If you can walk 30 minutes without difficulty, it is safe to try more intense activity such as jogging, treadmill, bicycling, low-impact aerobics, swimming, etc. b. Save the most intensive and strenuous activity for last such as sit-ups, heavy lifting, contact sports, etc  Refrain from any heavy lifting or straining until you are off narcotics for pain control.   c. DO NOT PUSH THROUGH PAIN.  Let pain be your guide: If it hurts to do something, don't do it.  Pain is your body warning you to avoid that activity for another week until the pain goes down. d. You may drive when you are no longer taking prescription pain medication, you can comfortably wear a seatbelt, and you can safely maneuver your car and apply brakes. e. You may have sexual intercourse when it is comfortable.  8. FOLLOW UP in our office a. Please call [facility] at [phone] to set up an appointment to see your surgeon in the office for a follow-up appointment approximately 2-3 weeks after your surgery. b. Make sure that you call for this appointment the day you arrive home to insure a convenient appointment time. 9. IF YOU HAVE DISABILITY OR FAMILY LEAVE FORMS, BRING THEM TO THE OFFICE FOR PROCESSING.  DO NOT GIVE THEM TO YOUR DOCTOR.   WHEN TO CALL US [phone]: 1. Poor pain control 2. Reactions / problems with new medications (rash/itching, nausea, etc)  3. Fever over 101.5 F (38.5 C) 4. Worsening swelling or bruising 5. Continued bleeding from incision. 6. Increased pain, redness, or drainage from the incision 7. Difficulty breathing / swallowing   The clinic staff is available to answer your questions during regular  business hours (8:30am-5pm).  Please don't hesitate to call and ask to speak to one of our nurses for clinical concerns.   If you have a medical emergency, go to the nearest emergency room or call 911.  A surgeon from [facility] is always on call at the hospitals   [facility] [address], [address], [place], [place]  [address] ? MAIN: [phone] ? TOLL FREE: [phone] ?  FAX [phone] [email or web address]   Post Anesthesia Home Care Instructions  Activity: Get plenty of rest for the remainder of the day. A responsible individual must stay with you for 24 hours following the procedure.  For the next 24 hours, DO NOT: -Drive a car -Operate machinery -Drink alcoholic beverages -Take any medication unless instructed by your physician -Make any legal decisions or sign important papers.  Meals: Start with liquid foods such as gelatin or soup. Progress to regular foods as tolerated. Avoid greasy, spicy, heavy foods. If nausea and/or vomiting occur, drink only clear liquids until the nausea and/or vomiting subsides. Call your physician if vomiting continues.  Special Instructions/Symptoms: Your throat may feel dry or sore   from the anesthesia or the breathing tube placed in your throat during surgery. If this causes discomfort, gargle with warm salt water. The discomfort should disappear within 24 hours.  If you had a scopolamine patch placed behind your ear for the management of post- operative nausea and/or vomiting:  1. The medication in the patch is effective for 72 hours, after which it should be removed.  Wrap patch in a tissue and discard in the trash. Wash hands thoroughly with soap and water. 2. You may remove the patch earlier than 72 hours if you experience unpleasant side effects which may include dry mouth, dizziness or visual disturbances. 3. Avoid touching the patch. Wash your hands with soap and water after contact  with the patch.      

## 2019-05-23 NOTE — Op Note (Signed)

## 2019-05-23 NOTE — Anesthesia Postprocedure Evaluation (Signed)
Anesthesia Post Note  Patient: Production manager  Procedure(s) Performed: PORT REMOVAL (N/A Chest)     Patient location during evaluation: Phase II Anesthesia Type: MAC Level of consciousness: awake Pain management: pain level controlled Vital Signs Assessment: post-procedure vital signs reviewed and stable Respiratory status: spontaneous breathing Cardiovascular status: stable Postop Assessment: no apparent nausea or vomiting Anesthetic complications: no    Last Vitals:  Vitals:   05/23/19 0800 05/23/19 0805  BP: 107/60   Pulse: 70 75  Resp: 16 15  Temp:    SpO2: 100% 100%    Last Pain:  Vitals:   05/23/19 0805  TempSrc:   PainSc: 0-No pain   Pain Goal:                   Huston Foley

## 2019-05-23 NOTE — Interval H&P Note (Signed)
History and Physical Interval Note:  05/23/2019 7:13 AM  Kayla Banks  has presented today for surgery, with the diagnosis of PORT IN PLACE.  The various methods of treatment have been discussed with the patient and family. After consideration of risks, benefits and other options for treatment, the patient has consented to  Procedure(s): PORT REMOVAL (N/A) as a surgical intervention.  The patient's history has been reviewed, patient examined, no change in status, stable for surgery.  I have reviewed the patient's chart and labs.  Questions were answered to the patient's satisfaction.     [place]

## 2019-05-23 NOTE — Transfer of Care (Signed)
Immediate Anesthesia Transfer of Care Note  Patient: Kayla Banks  Procedure(s) Performed: PORT REMOVAL (N/A Chest)  Patient Location: PACU  Anesthesia Type:MAC  Level of Consciousness: awake, alert , oriented and patient cooperative  Airway & Oxygen Therapy: Patient Spontanous Breathing  Post-op Assessment: Report given to RN and Post -op Vital signs reviewed and stable  Post vital signs: Reviewed and stable  Last Vitals:  Vitals Value Taken Time  BP 107/60 05/23/19 0800  Temp 36.6 C 05/23/19 0755  Pulse 72 05/23/19 0801  Resp 15 05/23/19 0801  SpO2 100 % 05/23/19 0801  Vitals shown include unvalidated device data.  Last Pain:  Vitals:   05/23/19 0755  TempSrc:   PainSc: 0-No pain         Complications: No apparent anesthesia complications

## 2019-05-24 ENCOUNTER — Encounter (HOSPITAL_BASED_OUTPATIENT_CLINIC_OR_DEPARTMENT_OTHER): Payer: Self-pay | Admitting: Surgery

## 2019-05-24 ENCOUNTER — Ambulatory Visit
Admission: RE | Admit: 2019-05-24 | Discharge: 2019-05-24 | Disposition: A | Discharge status: Home / Self Care | Payer: BC Managed Care – PPO | Source: Ambulatory Visit | Attending: Radiation Oncology | Admitting: Radiation Oncology

## 2019-05-24 ENCOUNTER — Other Ambulatory Visit: Payer: Self-pay

## 2019-05-24 DIAGNOSIS — Z510 Encounter for antineoplastic radiation therapy: Secondary | ICD-10-CM | POA: Insufficient documentation

## 2019-05-24 DIAGNOSIS — C50411 Malignant neoplasm of upper-outer quadrant of right female breast: Secondary | ICD-10-CM | POA: Diagnosis present

## 2019-05-24 DIAGNOSIS — C50412 Malignant neoplasm of upper-outer quadrant of left female breast: Secondary | ICD-10-CM

## 2019-05-25 ENCOUNTER — Encounter: Payer: Self-pay | Admitting: *Deleted

## 2019-05-30 ENCOUNTER — Telehealth: Payer: Self-pay | Admitting: Hematology and Oncology

## 2019-05-30 DIAGNOSIS — C50411 Malignant neoplasm of upper-outer quadrant of right female breast: Secondary | ICD-10-CM | POA: Diagnosis not present

## 2019-05-30 NOTE — Telephone Encounter (Signed)
Scheduled appt per 10/08 sch message - mailed reminder letter with appt date and time

## 2019-05-31 ENCOUNTER — Other Ambulatory Visit: Payer: Self-pay

## 2019-05-31 ENCOUNTER — Ambulatory Visit
Admission: RE | Admit: 2019-05-31 | Discharge: 2019-05-31 | Disposition: A | Discharge status: Home / Self Care | Payer: BC Managed Care – PPO | Source: Ambulatory Visit | Attending: Radiation Oncology | Admitting: Radiation Oncology

## 2019-05-31 DIAGNOSIS — C50411 Malignant neoplasm of upper-outer quadrant of right female breast: Secondary | ICD-10-CM | POA: Diagnosis not present

## 2019-06-01 ENCOUNTER — Other Ambulatory Visit: Payer: Self-pay

## 2019-06-01 ENCOUNTER — Ambulatory Visit
Admission: RE | Admit: 2019-06-01 | Discharge: 2019-06-01 | Disposition: A | Discharge status: Home / Self Care | Payer: BC Managed Care – PPO | Source: Ambulatory Visit | Attending: Radiation Oncology | Admitting: Radiation Oncology

## 2019-06-01 DIAGNOSIS — C50411 Malignant neoplasm of upper-outer quadrant of right female breast: Secondary | ICD-10-CM | POA: Diagnosis not present

## 2019-06-02 ENCOUNTER — Ambulatory Visit
Admission: RE | Admit: 2019-06-02 | Discharge: 2019-06-02 | Disposition: A | Discharge status: Home / Self Care | Payer: BC Managed Care – PPO | Source: Ambulatory Visit | Attending: Radiation Oncology | Admitting: Radiation Oncology

## 2019-06-02 ENCOUNTER — Other Ambulatory Visit: Payer: Self-pay

## 2019-06-02 DIAGNOSIS — Z170 Estrogen receptor positive status [ER+]: Secondary | ICD-10-CM

## 2019-06-02 DIAGNOSIS — C50411 Malignant neoplasm of upper-outer quadrant of right female breast: Secondary | ICD-10-CM | POA: Diagnosis not present

## 2019-06-02 DIAGNOSIS — C50412 Malignant neoplasm of upper-outer quadrant of left female breast: Secondary | ICD-10-CM

## 2019-06-02 MED ORDER — RADIAPLEXRX EX GEL
Freq: Once | CUTANEOUS | Status: AC
  Administered 2019-06-02: 17:00:00 via TOPICAL

## 2019-06-02 MED ORDER — ALRA NON-METALLIC DEODORANT (RAD-ONC)
1.0000 "application " | Freq: Once | TOPICAL | Status: AC
  Administered 2019-06-02: 1 via TOPICAL

## 2019-06-05 ENCOUNTER — Ambulatory Visit
Admission: RE | Admit: 2019-06-05 | Discharge: 2019-06-05 | Disposition: A | Discharge status: Home / Self Care | Payer: BC Managed Care – PPO | Source: Ambulatory Visit | Attending: Radiation Oncology | Admitting: Radiation Oncology

## 2019-06-05 ENCOUNTER — Other Ambulatory Visit: Payer: Self-pay

## 2019-06-05 DIAGNOSIS — C50411 Malignant neoplasm of upper-outer quadrant of right female breast: Secondary | ICD-10-CM | POA: Diagnosis not present

## 2019-06-06 ENCOUNTER — Ambulatory Visit
Admission: RE | Admit: 2019-06-06 | Discharge: 2019-06-06 | Disposition: A | Discharge status: Home / Self Care | Payer: BC Managed Care – PPO | Source: Ambulatory Visit | Attending: Radiation Oncology | Admitting: Radiation Oncology

## 2019-06-06 ENCOUNTER — Other Ambulatory Visit: Payer: Self-pay

## 2019-06-06 DIAGNOSIS — C50411 Malignant neoplasm of upper-outer quadrant of right female breast: Secondary | ICD-10-CM | POA: Diagnosis not present

## 2019-06-07 ENCOUNTER — Other Ambulatory Visit: Payer: Self-pay

## 2019-06-07 ENCOUNTER — Ambulatory Visit
Admission: RE | Admit: 2019-06-07 | Discharge: 2019-06-07 | Disposition: A | Discharge status: Home / Self Care | Payer: BC Managed Care – PPO | Source: Ambulatory Visit | Attending: Radiation Oncology | Admitting: Radiation Oncology

## 2019-06-07 DIAGNOSIS — C50411 Malignant neoplasm of upper-outer quadrant of right female breast: Secondary | ICD-10-CM | POA: Diagnosis not present

## 2019-06-08 ENCOUNTER — Ambulatory Visit
Admission: RE | Admit: 2019-06-08 | Discharge: 2019-06-08 | Disposition: A | Discharge status: Home / Self Care | Payer: BC Managed Care – PPO | Source: Ambulatory Visit | Attending: Radiation Oncology | Admitting: Radiation Oncology

## 2019-06-08 ENCOUNTER — Other Ambulatory Visit: Payer: Self-pay

## 2019-06-08 DIAGNOSIS — C50411 Malignant neoplasm of upper-outer quadrant of right female breast: Secondary | ICD-10-CM | POA: Diagnosis not present

## 2019-06-09 ENCOUNTER — Ambulatory Visit
Admission: RE | Admit: 2019-06-09 | Discharge: 2019-06-09 | Disposition: A | Discharge status: Home / Self Care | Payer: BC Managed Care – PPO | Source: Ambulatory Visit | Attending: Radiation Oncology | Admitting: Radiation Oncology

## 2019-06-09 ENCOUNTER — Other Ambulatory Visit: Payer: Self-pay

## 2019-06-09 DIAGNOSIS — C50411 Malignant neoplasm of upper-outer quadrant of right female breast: Secondary | ICD-10-CM | POA: Diagnosis not present

## 2019-06-12 ENCOUNTER — Other Ambulatory Visit: Payer: Self-pay

## 2019-06-12 ENCOUNTER — Ambulatory Visit
Admission: RE | Admit: 2019-06-12 | Discharge: 2019-06-12 | Disposition: A | Discharge status: Home / Self Care | Payer: BC Managed Care – PPO | Source: Ambulatory Visit | Attending: Radiation Oncology | Admitting: Radiation Oncology

## 2019-06-12 DIAGNOSIS — C50411 Malignant neoplasm of upper-outer quadrant of right female breast: Secondary | ICD-10-CM | POA: Diagnosis not present

## 2019-06-13 ENCOUNTER — Other Ambulatory Visit: Payer: Self-pay

## 2019-06-13 ENCOUNTER — Ambulatory Visit
Admission: RE | Admit: 2019-06-13 | Discharge: 2019-06-13 | Disposition: A | Discharge status: Home / Self Care | Payer: BC Managed Care – PPO | Source: Ambulatory Visit | Attending: Radiation Oncology | Admitting: Radiation Oncology

## 2019-06-13 DIAGNOSIS — C50411 Malignant neoplasm of upper-outer quadrant of right female breast: Secondary | ICD-10-CM | POA: Diagnosis not present

## 2019-06-14 ENCOUNTER — Other Ambulatory Visit: Payer: Self-pay

## 2019-06-14 ENCOUNTER — Ambulatory Visit
Admission: RE | Admit: 2019-06-14 | Discharge: 2019-06-14 | Disposition: A | Discharge status: Home / Self Care | Payer: BC Managed Care – PPO | Source: Ambulatory Visit | Attending: Radiation Oncology | Admitting: Radiation Oncology

## 2019-06-14 DIAGNOSIS — C50411 Malignant neoplasm of upper-outer quadrant of right female breast: Secondary | ICD-10-CM | POA: Diagnosis not present

## 2019-06-15 ENCOUNTER — Ambulatory Visit
Admission: RE | Admit: 2019-06-15 | Discharge: 2019-06-15 | Disposition: A | Discharge status: Home / Self Care | Payer: BC Managed Care – PPO | Source: Ambulatory Visit | Attending: Radiation Oncology | Admitting: Radiation Oncology

## 2019-06-15 ENCOUNTER — Other Ambulatory Visit: Payer: Self-pay

## 2019-06-15 DIAGNOSIS — C50411 Malignant neoplasm of upper-outer quadrant of right female breast: Secondary | ICD-10-CM | POA: Diagnosis not present

## 2019-06-16 ENCOUNTER — Other Ambulatory Visit: Payer: Self-pay

## 2019-06-16 ENCOUNTER — Ambulatory Visit
Admission: RE | Admit: 2019-06-16 | Discharge: 2019-06-16 | Disposition: A | Discharge status: Home / Self Care | Payer: BC Managed Care – PPO | Source: Ambulatory Visit | Attending: Radiation Oncology | Admitting: Radiation Oncology

## 2019-06-16 DIAGNOSIS — C50411 Malignant neoplasm of upper-outer quadrant of right female breast: Secondary | ICD-10-CM | POA: Diagnosis not present

## 2019-06-19 ENCOUNTER — Other Ambulatory Visit: Payer: Self-pay

## 2019-06-19 ENCOUNTER — Ambulatory Visit
Admission: RE | Admit: 2019-06-19 | Discharge: 2019-06-19 | Disposition: A | Discharge status: Home / Self Care | Payer: BC Managed Care – PPO | Source: Ambulatory Visit | Attending: Radiation Oncology | Admitting: Radiation Oncology

## 2019-06-19 DIAGNOSIS — Z510 Encounter for antineoplastic radiation therapy: Secondary | ICD-10-CM | POA: Diagnosis not present

## 2019-06-19 DIAGNOSIS — C773 Secondary and unspecified malignant neoplasm of axilla and upper limb lymph nodes: Secondary | ICD-10-CM | POA: Diagnosis not present

## 2019-06-19 DIAGNOSIS — C50412 Malignant neoplasm of upper-outer quadrant of left female breast: Secondary | ICD-10-CM | POA: Diagnosis present

## 2019-06-19 DIAGNOSIS — Z170 Estrogen receptor positive status [ER+]: Secondary | ICD-10-CM | POA: Insufficient documentation

## 2019-06-20 ENCOUNTER — Other Ambulatory Visit: Payer: Self-pay

## 2019-06-20 ENCOUNTER — Ambulatory Visit
Admission: RE | Admit: 2019-06-20 | Discharge: 2019-06-20 | Disposition: A | Discharge status: Home / Self Care | Payer: BC Managed Care – PPO | Source: Ambulatory Visit | Attending: Radiation Oncology | Admitting: Radiation Oncology

## 2019-06-20 DIAGNOSIS — Z510 Encounter for antineoplastic radiation therapy: Secondary | ICD-10-CM | POA: Diagnosis not present

## 2019-06-21 ENCOUNTER — Other Ambulatory Visit: Payer: Self-pay

## 2019-06-21 ENCOUNTER — Ambulatory Visit
Admission: RE | Admit: 2019-06-21 | Discharge: 2019-06-21 | Disposition: A | Discharge status: Home / Self Care | Payer: BC Managed Care – PPO | Source: Ambulatory Visit | Attending: Radiation Oncology | Admitting: Radiation Oncology

## 2019-06-21 DIAGNOSIS — Z510 Encounter for antineoplastic radiation therapy: Secondary | ICD-10-CM | POA: Diagnosis not present

## 2019-06-22 ENCOUNTER — Other Ambulatory Visit: Payer: Self-pay

## 2019-06-22 ENCOUNTER — Ambulatory Visit
Admission: RE | Admit: 2019-06-22 | Discharge: 2019-06-22 | Disposition: A | Discharge status: Home / Self Care | Payer: BC Managed Care – PPO | Source: Ambulatory Visit | Attending: Radiation Oncology | Admitting: Radiation Oncology

## 2019-06-22 DIAGNOSIS — Z510 Encounter for antineoplastic radiation therapy: Secondary | ICD-10-CM | POA: Diagnosis not present

## 2019-06-23 ENCOUNTER — Ambulatory Visit
Admission: RE | Admit: 2019-06-23 | Discharge: 2019-06-23 | Disposition: A | Discharge status: Home / Self Care | Payer: BC Managed Care – PPO | Source: Ambulatory Visit | Attending: Radiation Oncology | Admitting: Radiation Oncology

## 2019-06-23 ENCOUNTER — Other Ambulatory Visit: Payer: Self-pay

## 2019-06-23 DIAGNOSIS — Z510 Encounter for antineoplastic radiation therapy: Secondary | ICD-10-CM | POA: Diagnosis not present

## 2019-06-26 ENCOUNTER — Ambulatory Visit
Admission: RE | Admit: 2019-06-26 | Discharge: 2019-06-26 | Disposition: A | Discharge status: Home / Self Care | Payer: BC Managed Care – PPO | Source: Ambulatory Visit | Attending: Radiation Oncology | Admitting: Radiation Oncology

## 2019-06-26 ENCOUNTER — Other Ambulatory Visit: Payer: Self-pay

## 2019-06-26 DIAGNOSIS — Z510 Encounter for antineoplastic radiation therapy: Secondary | ICD-10-CM | POA: Diagnosis not present

## 2019-06-27 ENCOUNTER — Other Ambulatory Visit: Payer: Self-pay

## 2019-06-27 ENCOUNTER — Ambulatory Visit
Admission: RE | Admit: 2019-06-27 | Discharge: 2019-06-27 | Disposition: A | Discharge status: Home / Self Care | Payer: BC Managed Care – PPO | Source: Ambulatory Visit | Attending: Radiation Oncology | Admitting: Radiation Oncology

## 2019-06-27 DIAGNOSIS — Z510 Encounter for antineoplastic radiation therapy: Secondary | ICD-10-CM | POA: Diagnosis not present

## 2019-06-28 ENCOUNTER — Other Ambulatory Visit: Payer: Self-pay

## 2019-06-28 ENCOUNTER — Ambulatory Visit
Admission: RE | Admit: 2019-06-28 | Discharge: 2019-06-28 | Disposition: A | Discharge status: Home / Self Care | Payer: BC Managed Care – PPO | Source: Ambulatory Visit | Attending: Radiation Oncology | Admitting: Radiation Oncology

## 2019-06-28 DIAGNOSIS — Z510 Encounter for antineoplastic radiation therapy: Secondary | ICD-10-CM | POA: Diagnosis not present

## 2019-06-29 ENCOUNTER — Other Ambulatory Visit: Payer: Self-pay

## 2019-06-29 ENCOUNTER — Ambulatory Visit
Admission: RE | Admit: 2019-06-29 | Discharge: 2019-06-29 | Disposition: A | Discharge status: Home / Self Care | Payer: BC Managed Care – PPO | Source: Ambulatory Visit | Attending: Radiation Oncology | Admitting: Radiation Oncology

## 2019-06-29 DIAGNOSIS — Z510 Encounter for antineoplastic radiation therapy: Secondary | ICD-10-CM | POA: Diagnosis not present

## 2019-06-30 ENCOUNTER — Ambulatory Visit
Admission: RE | Admit: 2019-06-30 | Discharge: 2019-06-30 | Disposition: A | Discharge status: Home / Self Care | Payer: BC Managed Care – PPO | Source: Ambulatory Visit | Attending: Radiation Oncology | Admitting: Radiation Oncology

## 2019-06-30 ENCOUNTER — Other Ambulatory Visit: Payer: Self-pay

## 2019-06-30 DIAGNOSIS — Z510 Encounter for antineoplastic radiation therapy: Secondary | ICD-10-CM | POA: Diagnosis not present

## 2019-07-03 ENCOUNTER — Ambulatory Visit
Admission: RE | Admit: 2019-07-03 | Discharge: 2019-07-03 | Disposition: A | Discharge status: Home / Self Care | Payer: BC Managed Care – PPO | Source: Ambulatory Visit | Attending: Radiation Oncology | Admitting: Radiation Oncology

## 2019-07-03 ENCOUNTER — Other Ambulatory Visit: Payer: Self-pay

## 2019-07-03 DIAGNOSIS — Z510 Encounter for antineoplastic radiation therapy: Secondary | ICD-10-CM | POA: Diagnosis not present

## 2019-07-04 ENCOUNTER — Ambulatory Visit
Admission: RE | Admit: 2019-07-04 | Discharge: 2019-07-04 | Disposition: A | Discharge status: Home / Self Care | Payer: BC Managed Care – PPO | Source: Ambulatory Visit | Attending: Radiation Oncology | Admitting: Radiation Oncology

## 2019-07-04 ENCOUNTER — Other Ambulatory Visit: Payer: Self-pay

## 2019-07-04 DIAGNOSIS — Z510 Encounter for antineoplastic radiation therapy: Secondary | ICD-10-CM | POA: Diagnosis not present

## 2019-07-05 ENCOUNTER — Ambulatory Visit: Admission: RE | Admit: 2019-07-05 | Payer: BC Managed Care – PPO | Source: Ambulatory Visit

## 2019-07-05 ENCOUNTER — Other Ambulatory Visit: Payer: Self-pay

## 2019-07-05 ENCOUNTER — Ambulatory Visit
Admission: RE | Admit: 2019-07-05 | Discharge: 2019-07-05 | Disposition: A | Discharge status: Home / Self Care | Payer: BC Managed Care – PPO | Source: Ambulatory Visit | Attending: Radiation Oncology | Admitting: Radiation Oncology

## 2019-07-05 DIAGNOSIS — C50412 Malignant neoplasm of upper-outer quadrant of left female breast: Secondary | ICD-10-CM

## 2019-07-05 DIAGNOSIS — Z510 Encounter for antineoplastic radiation therapy: Secondary | ICD-10-CM | POA: Diagnosis not present

## 2019-07-06 ENCOUNTER — Ambulatory Visit
Admission: RE | Admit: 2019-07-06 | Discharge: 2019-07-06 | Disposition: A | Discharge status: Home / Self Care | Payer: BC Managed Care – PPO | Source: Ambulatory Visit | Attending: Radiation Oncology | Admitting: Radiation Oncology

## 2019-07-06 DIAGNOSIS — Z510 Encounter for antineoplastic radiation therapy: Secondary | ICD-10-CM | POA: Diagnosis not present

## 2019-07-07 ENCOUNTER — Other Ambulatory Visit: Payer: Self-pay | Admitting: Radiation Oncology

## 2019-07-07 ENCOUNTER — Other Ambulatory Visit: Payer: Self-pay

## 2019-07-07 ENCOUNTER — Ambulatory Visit
Admission: RE | Admit: 2019-07-07 | Discharge: 2019-07-07 | Disposition: A | Discharge status: Home / Self Care | Payer: BC Managed Care – PPO | Source: Ambulatory Visit | Attending: Radiation Oncology | Admitting: Radiation Oncology

## 2019-07-07 ENCOUNTER — Ambulatory Visit: Payer: BC Managed Care – PPO | Admitting: Radiation Oncology

## 2019-07-07 DIAGNOSIS — Z510 Encounter for antineoplastic radiation therapy: Secondary | ICD-10-CM | POA: Diagnosis not present

## 2019-07-07 MED ORDER — CLINDAMYCIN HCL 300 MG PO CAPS: 300.0000 mg | ORAL_CAPSULE | Freq: Four times a day (QID) | ORAL | 0 refills | Status: DC

## 2019-07-09 ENCOUNTER — Ambulatory Visit
Admission: RE | Admit: 2019-07-09 | Discharge: 2019-07-09 | Disposition: A | Discharge status: Home / Self Care | Payer: BC Managed Care – PPO | Source: Ambulatory Visit | Attending: Radiation Oncology | Admitting: Radiation Oncology

## 2019-07-09 ENCOUNTER — Other Ambulatory Visit: Payer: Self-pay

## 2019-07-09 DIAGNOSIS — Z170 Estrogen receptor positive status [ER+]: Secondary | ICD-10-CM

## 2019-07-09 DIAGNOSIS — Z510 Encounter for antineoplastic radiation therapy: Secondary | ICD-10-CM | POA: Diagnosis not present

## 2019-07-09 DIAGNOSIS — C50412 Malignant neoplasm of upper-outer quadrant of left female breast: Secondary | ICD-10-CM

## 2019-07-09 MED ORDER — SONAFINE EX EMUL
1.0000 "application " | Freq: Once | CUTANEOUS | Status: AC
  Administered 2019-07-09: 1 via TOPICAL

## 2019-07-09 NOTE — Progress Notes (Signed)
Painted left breast and axilla with gentian violet as ordered by Dr. Lisbeth Renshaw. Patient tolerated this well.

## 2019-07-10 ENCOUNTER — Ambulatory Visit: Payer: BC Managed Care – PPO

## 2019-07-10 ENCOUNTER — Ambulatory Visit
Admission: RE | Admit: 2019-07-10 | Discharge: 2019-07-10 | Disposition: A | Discharge status: Home / Self Care | Payer: BC Managed Care – PPO | Source: Ambulatory Visit | Attending: Radiation Oncology | Admitting: Radiation Oncology

## 2019-07-10 ENCOUNTER — Other Ambulatory Visit: Payer: Self-pay | Admitting: Medical

## 2019-07-10 ENCOUNTER — Other Ambulatory Visit: Payer: Self-pay

## 2019-07-10 DIAGNOSIS — Z510 Encounter for antineoplastic radiation therapy: Secondary | ICD-10-CM | POA: Diagnosis not present

## 2019-07-10 NOTE — Progress Notes (Signed)
Patient endorses less less following gentian violet application yesterday. Areas painted yesterday remain dry. New areas of moist desquamation noted left axilla and left mammary fold. Paint new areas of moist desquamation with gentian violet. Patient tolerated this well.

## 2019-07-11 ENCOUNTER — Ambulatory Visit: Payer: BC Managed Care – PPO

## 2019-07-12 ENCOUNTER — Ambulatory Visit: Payer: BC Managed Care – PPO

## 2019-07-17 ENCOUNTER — Ambulatory Visit: Payer: BC Managed Care – PPO

## 2019-07-17 ENCOUNTER — Other Ambulatory Visit: Payer: Self-pay

## 2019-07-17 ENCOUNTER — Ambulatory Visit
Admission: RE | Admit: 2019-07-17 | Discharge: 2019-07-17 | Disposition: A | Discharge status: Home / Self Care | Payer: BC Managed Care – PPO | Source: Ambulatory Visit | Attending: Radiation Oncology | Admitting: Radiation Oncology

## 2019-07-17 DIAGNOSIS — Z510 Encounter for antineoplastic radiation therapy: Secondary | ICD-10-CM | POA: Diagnosis not present

## 2019-07-18 ENCOUNTER — Inpatient Hospital Stay: Payer: BC Managed Care – PPO | Admitting: Hematology and Oncology

## 2019-07-18 ENCOUNTER — Ambulatory Visit
Admission: RE | Admit: 2019-07-18 | Discharge: 2019-07-18 | Disposition: A | Discharge status: Home / Self Care | Payer: BC Managed Care – PPO | Source: Ambulatory Visit | Attending: Radiation Oncology | Admitting: Radiation Oncology

## 2019-07-18 ENCOUNTER — Other Ambulatory Visit: Payer: Self-pay

## 2019-07-18 DIAGNOSIS — C50412 Malignant neoplasm of upper-outer quadrant of left female breast: Secondary | ICD-10-CM | POA: Diagnosis present

## 2019-07-18 DIAGNOSIS — Z510 Encounter for antineoplastic radiation therapy: Secondary | ICD-10-CM | POA: Insufficient documentation

## 2019-07-18 DIAGNOSIS — Z170 Estrogen receptor positive status [ER+]: Secondary | ICD-10-CM | POA: Insufficient documentation

## 2019-07-18 DIAGNOSIS — C773 Secondary and unspecified malignant neoplasm of axilla and upper limb lymph nodes: Secondary | ICD-10-CM | POA: Insufficient documentation

## 2019-07-18 NOTE — Progress Notes (Signed)
  Radiation Oncology         [phone] ________________________________  Name: Kayla Banks MRN: [id number]  Date: 05/24/2019  DOB: 04-Nov-1955  Optical Surface Tracking Plan:  Since intensity modulated radiotherapy (IMRT) and 3D conformal radiation treatment methods are predicated on accurate and precise positioning for treatment, intrafraction motion monitoring is medically necessary to ensure accurate and safe treatment delivery.  The ability to quantify intrafraction motion without excessive ionizing radiation dose can only be performed with optical surface tracking. Accordingly, surface imaging offers the opportunity to obtain 3D measurements of patient position throughout IMRT and 3D treatments without excessive radiation exposure.  I am ordering optical surface tracking for this patient's upcoming course of radiotherapy. ________________________________  Kyung Rudd, MD 07/18/2019 3:57 PM    Reference:   Ursula Alert, J, et al. Surface imaging-based analysis of intrafraction motion for breast radiotherapy patients.Journal of [place], n. 6, nov. 2014. ISSN [id number].   Available at: <http://www.jacmp.org/index.php/jacmp/article/view/4957>.

## 2019-07-18 NOTE — Assessment & Plan Note (Deleted)
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 Adj chemo with DD Adriamycin and Cytoxan followed by Taxol weekly X 11 (stopped for neuropathy) 2. Adj XRT 06/01/2019-07/18/2019 3. Adj Anti estrogen therapy --------------------------------------------------------------------------------------------- Current treatment: Plan to start adjuvant antiestrogen therapy with anastrozole 1 mg daily Anastrozole counseling:We discussed the risks and benefits of anti-estrogen therapy with aromatase inhibitors. These include but not limited to insomnia, hot flashes, mood changes, vaginal dryness, bone density loss, and weight gain. We strongly believe that the benefits far outweigh the risks. Patient understands these risks and consented to starting treatment. Planned treatment duration is 7 years.  Chemo-induced peripheral neuropathy: Being monitored Return to clinic in 3 months for survivorship care plan visit

## 2019-07-18 NOTE — Telephone Encounter (Signed)
Refill request

## 2019-07-18 NOTE — Progress Notes (Signed)
  Radiation Oncology         [phone] ________________________________  Name: Kayla Banks MRN: [id number]  Date: 05/24/2019  DOB: Nov 26, 1955  Diagnosis DIAGNOSIS:     ICD-10-CM   1. Malignant neoplasm of upper-outer quadrant of left breast in female, estrogen receptor positive ([place])  C50.412    Z17.0      SIMULATION AND TREATMENT PLANNING NOTE  The patient presented for simulation prior to beginning her course of radiation treatment for her diagnosis of left-sided breast cancer. The patient was placed in a supine position on a breast board. A customized vac-lock bag was also constructed and this complex treatment device will be used on a daily basis during her treatment. In this fashion, a CT scan was obtained through the chest area and an isocenter was placed near the chest wall at the upper aspect of the right chest. A breath-hold technique has also been evaluated to determine if this significantly improves the spatial relationship between the target region and the heart. Based on this analysis, a breath-hold technique has not been ordered for the patient's treatment.  The patient will be planned to receive a course of radiation initially to a dose of 50.4 gray. This will consist of a 4 field technique targeting the left breast as well as the supraclavicular region. Therefore 2 customized medial and lateral tangent fields have been created targeting the chest wall, and also 2 additional customized fields have been designed to treat the supraclavicular region both with a left supraclavicular field and a left posterior axillary boost field. A forward planning/reduced field technique will also be evaluated to determine if this significantly improves the dose homogeneity of the overall plan. Therefore, additional customized blocks/fields may be necessary.  This initial treatment will be accomplished at 1.8 gray per fraction.   The initial plan will consist of a 3-D conformal technique. The  target volume/scar, heart and lungs have been contoured and dose volume histograms of each of these structures will be evaluated as part of the 3-D conformal treatment planning process.   It is anticipated that the patient will then receive a 10 gray boost to the lumpectomy cavity. This will be accomplished at 2 gray per fraction. The final anticipated total dose therefore will correspond to 60.4 gray.    _______________________________   Jodelle Gross, MD, PhD

## 2019-07-19 ENCOUNTER — Ambulatory Visit: Payer: BC Managed Care – PPO

## 2019-07-19 ENCOUNTER — Other Ambulatory Visit: Payer: Self-pay

## 2019-07-19 ENCOUNTER — Ambulatory Visit
Admission: RE | Admit: 2019-07-19 | Discharge: 2019-07-19 | Disposition: A | Discharge status: Home / Self Care | Payer: BC Managed Care – PPO | Source: Ambulatory Visit | Attending: Radiation Oncology | Admitting: Radiation Oncology

## 2019-07-19 DIAGNOSIS — Z510 Encounter for antineoplastic radiation therapy: Secondary | ICD-10-CM | POA: Diagnosis not present

## 2019-07-20 ENCOUNTER — Ambulatory Visit
Admission: RE | Admit: 2019-07-20 | Discharge: 2019-07-20 | Disposition: A | Discharge status: Home / Self Care | Payer: BC Managed Care – PPO | Source: Ambulatory Visit | Attending: Radiation Oncology | Admitting: Radiation Oncology

## 2019-07-20 ENCOUNTER — Other Ambulatory Visit: Payer: Self-pay

## 2019-07-20 DIAGNOSIS — Z510 Encounter for antineoplastic radiation therapy: Secondary | ICD-10-CM | POA: Diagnosis not present

## 2019-07-21 ENCOUNTER — Other Ambulatory Visit: Payer: Self-pay

## 2019-07-21 ENCOUNTER — Encounter: Payer: Self-pay | Admitting: *Deleted

## 2019-07-21 ENCOUNTER — Encounter: Payer: Self-pay | Admitting: Radiation Oncology

## 2019-07-21 ENCOUNTER — Ambulatory Visit
Admission: RE | Admit: 2019-07-21 | Discharge: 2019-07-21 | Disposition: A | Discharge status: Home / Self Care | Payer: BC Managed Care – PPO | Source: Ambulatory Visit | Attending: Radiation Oncology | Admitting: Radiation Oncology

## 2019-07-21 DIAGNOSIS — Z510 Encounter for antineoplastic radiation therapy: Secondary | ICD-10-CM | POA: Diagnosis not present

## 2019-07-25 ENCOUNTER — Telehealth: Payer: Self-pay | Admitting: Adult Health

## 2019-07-25 NOTE — Progress Notes (Signed)
Patient Care Team: Carollee Herter, Alferd Apa, DO as PCP - General Dorna Leitz, MD as Consulting Physician (Orthopedic Surgery) Erroll Luna, MD as Consulting Physician (General Surgery) Nicholas Lose, MD as Consulting Physician (Hematology and Oncology) Kyung Rudd, MD as Consulting Physician (Radiation Oncology) Rockwell Germany, RN as Oncology Nurse Navigator Mauro Kaufmann, RN as Oncology Nurse Navigator  DIAGNOSIS:    ICD-10-CM   1. Malignant neoplasm of upper-outer quadrant of left breast in female, estrogen receptor positive ([place])  C50.412    Z17.0     SUMMARY OF ONCOLOGIC HISTORY: Oncology History  Malignant neoplasm of upper-outer quadrant of left breast in female, estrogen receptor positive ([place])  08/24/2018 Initial Biopsy   Screening detected left breast mass at 2:30 position 6 mm in size with one abnormal lymph node and stable benign calcifications, biopsy of the mass grade 2 IDC with DCIS and lymphovascular invasion, lymph node biopsy is positive, ER 95%, PR 40%, Ki-67 20%, HER-2 -1+ by IHC, T1b N1 stage Ib   08/31/2018 Cancer Staging   Staging form: Breast, AJCC 8th Edition - Clinical: Stage IB (cT1b, cN1(f), cM0, G2, ER+, PR+, HER2-) - Signed by Nicholas Lose, MD on 08/31/2018   10/11/2018 Surgery   Left lumpectomy: Grade 2 IDC with DCIS, 1 cm, margins negative, DCIS focally 0.1 cm from superior margin, 2/2 lymph nodes positive, ER 95%, PR 45%, HER-2 negative, Ki-67 20% AXLND: 3/20 LN Pos (total 5/22)   10/18/2018 Cancer Staging   Staging form: Breast, AJCC 8th Edition - Pathologic stage from 10/18/2018: Stage IB (pT1b, pN2a, cM0, G2, ER+, PR+, HER2-) - Signed by Nicholas Lose, MD on 11/14/2018   12/15/2018 -  Chemotherapy   The patient had DOXOrubicin (ADRIAMYCIN) chemo injection 142 mg, 60 mg/m2 = 142 mg, Intravenous,  Once, 4 of 4 cycles Administration: 142 mg (12/15/2018), 142 mg (12/29/2018), 142 mg (01/12/2019), 142 mg (01/26/2019) palonosetron (ALOXI) injection 0.25 mg,  0.25 mg, Intravenous,  Once, 4 of 4 cycles Administration: 0.25 mg (12/15/2018), 0.25 mg (12/29/2018), 0.25 mg (01/12/2019), 0.25 mg (01/26/2019) pegfilgrastim-cbqv (UDENYCA) injection 6 mg, 6 mg, Subcutaneous, Once, 4 of 4 cycles Administration: 6 mg (12/17/2018), 6 mg (12/31/2018), 6 mg (01/14/2019), 6 mg (01/28/2019) cyclophosphamide (CYTOXAN) 1,420 mg in sodium chloride 0.9 % 250 mL chemo infusion, 600 mg/m2 = 1,420 mg, Intravenous,  Once, 4 of 4 cycles Administration: 1,420 mg (12/15/2018), 1,420 mg (12/29/2018), 1,420 mg (01/12/2019), 1,420 mg (01/26/2019) PACLitaxel (TAXOL) 192 mg in sodium chloride 0.9 % 250 mL chemo infusion (</= 17m/m2), 80 mg/m2 = 192 mg, Intravenous,  Once, 11 of 12 cycles Dose modification: 60 mg/m2 (original dose 80 mg/m2, Cycle 12, Reason: Dose not tolerated) Administration: 192 mg (02/09/2019), 192 mg (02/16/2019), 192 mg (02/23/2019), 192 mg (03/02/2019), 192 mg (03/09/2019), 192 mg (03/16/2019), 192 mg (03/23/2019), 144 mg (03/30/2019), 144 mg (04/06/2019), 144 mg (04/14/2019), 144 mg (04/20/2019) fosaprepitant (EMEND) 150 mg, dexamethasone (DECADRON) 12 mg in sodium chloride 0.9 % 145 mL IVPB, , Intravenous,  Once, 4 of 4 cycles Administration:  (12/15/2018),  (12/29/2018),  (01/12/2019),  (01/26/2019)  for chemotherapy treatment.    06/01/2019 - 07/21/2019 Radiation Therapy   Adjuvant radiation therapy     CHIEF COMPLIANT: Follow-up to discuss antiestrogen therapy  INTERVAL HISTORY: [name] a 63y.o. with above-mentioned history of left breast cancer who underwent a lumpectomy followed by an axillary lymph node dissection, adjuvant chemotherapy, and completed radiation on 07/21/19. She presents to the clinic today to discuss antiestrogen therapy.  She had  profound radiation dermatitis and is slowly healing from it.  She does have fatigue from radiation as well.  REVIEW OF SYSTEMS:   Constitutional: Denies fevers, chills or abnormal weight loss Eyes: Denies blurriness of vision  Ears, nose, mouth, throat, and face: Denies mucositis or sore throat Respiratory: Denies cough, dyspnea or wheezes Cardiovascular: Denies palpitation, chest discomfort Gastrointestinal: Denies nausea, heartburn or change in bowel habits Skin: Denies abnormal skin rashes Lymphatics: Denies new lymphadenopathy or easy bruising Neurological: Denies numbness, tingling or new weaknesses Behavioral/Psych: Mood is stable, no new changes  Extremities: No lower extremity edema Breast: Radiation dermatitis All other systems were reviewed with the patient and are negative.  I have reviewed the past medical history, past surgical history, social history and family history with the patient and they are unchanged from previous note.  ALLERGIES:  is allergic to erythromycin; lisinopril; penicillins; and adhesive [tape].  MEDICATIONS:  Current Outpatient Medications  Medication Sig Dispense Refill  . amLODipine (NORVASC) 5 MG tablet Take 1 tablet (5 mg total) by mouth daily. Needs ov before any more refills 90 tablet 0  . carvedilol (COREG) 25 MG tablet TAKE 1 TABLET(25 MG) BY MOUTH TWICE DAILY WITH A MEAL. Needs ov before any more refills 180 tablet 0  . clindamycin (CLEOCIN) 300 MG capsule Take 1 capsule (300 mg total) by mouth 4 (four) times daily. 40 capsule 0  . EPINEPHRINE 0.3 mg/0.3 mL IJ SOAJ injection INJECT 0.3 ML(1 SYRINGE) IN THE MUSCLE AS NEEDED FOR ALLERGIC REACTION 2 Device 0  . furosemide (LASIX) 20 MG tablet Needs ov before any more refills 90 tablet 0  . gabapentin (NEURONTIN) 300 MG capsule TAKE 1 CAPSULE(300 MG) BY MOUTH AT BEDTIME 30 capsule 2  . ibuprofen (ADVIL) 800 MG tablet Take 1 tablet (800 mg total) by mouth every 8 (eight) hours as needed. 30 tablet 0  . LORazepam (ATIVAN) 0.5 MG tablet TAKE 1 TABLET(0.5 MG) BY MOUTH AT BEDTIME AS NEEDED FOR SLEEP 30 tablet 1  . meloxicam (MOBIC) 15 MG tablet TAKE 1 TABLET(15 MG) BY MOUTH DAILY AS NEEDED FOR PAIN 90 tablet 3  . metroNIDAZOLE  (METROGEL) 0.75 % gel Apply 1 application topically 2 (two) times daily. 45 g 2  . SYNTHROID 100 MCG tablet 1 po qd 90 tablet 3  . valACYclovir (VALTREX) 1000 MG tablet TAKE 1 TABLET BY MOUTH THREE TIMES DAILY, DISCONTINUE ALL PREVIOUS REFILLS FOR THIS MEDICATION (Patient taking differently: as needed. ) 30 tablet 5   No current facility-administered medications for this visit.     PHYSICAL EXAMINATION: ECOG PERFORMANCE STATUS: 1 - Symptomatic but completely ambulatory  Vitals:   07/26/19 1354  BP: (!) 151/77  Pulse: 78  Resp: 18  Temp: 97.8 F (36.6 C)  SpO2: 100%   Filed Weights   07/26/19 1354  Weight: 261 lb 1.6 oz (118.4 kg)    GENERAL: alert, no distress and comfortable SKIN: skin color, texture, turgor are normal, no rashes or significant lesions EYES: normal, Conjunctiva are pink and non-injected, sclera clear OROPHARYNX: no exudate, no erythema and lips, buccal mucosa, and tongue normal  NECK: supple, thyroid normal size, non-tender, without nodularity LYMPH: no palpable lymphadenopathy in the cervical, axillary or inguinal LUNGS: clear to auscultation and percussion with normal breathing effort HEART: regular rate & rhythm and no murmurs and no lower extremity edema ABDOMEN: abdomen soft, non-tender and normal bowel sounds MUSCULOSKELETAL: no cyanosis of digits and no clubbing  NEURO: alert & oriented x 3 with fluent speech,  no focal motor/sensory deficits EXTREMITIES: No lower extremity edema  LABORATORY DATA:  I have reviewed the data as listed CMP Latest Ref Rng & Units 05/19/2019 04/27/2019 04/20/2019  Glucose 70 - 99 mg/dL 109(H) 110(H) 127(H)  BUN 8 - 23 mg/dL 10 12 12   Creatinine 0.44 - 1.00 mg/dL 0.68 0.77 0.76  Sodium 135 - 145 mmol/L 142 144 141  Potassium 3.5 - 5.1 mmol/L 4.2 3.9 4.0  Chloride 98 - 111 mmol/L 110 109 108  CO2 22 - 32 mmol/L 22 27 25   Calcium 8.9 - 10.3 mg/dL 8.9 8.7(L) 8.7(L)  Total Protein 6.5 - 8.1 g/dL 6.1(L) 6.2(L) 6.1(L)  Total  Bilirubin 0.3 - 1.2 mg/dL 0.7 0.8 0.8  Alkaline Phos 38 - 126 U/L 59 64 70  AST 15 - 41 U/L 21 17 15   ALT 0 - 44 U/L 21 17 17     Lab Results  Component Value Date   WBC 8.1 05/19/2019   HGB 12.0 05/19/2019   HCT 37.3 05/19/2019   MCV 92.6 05/19/2019   PLT 251 05/19/2019   NEUTROABS 6.1 05/19/2019    ASSESSMENT & PLAN:  Malignant neoplasm of upper-outer quadrant of left breast in female, estrogen receptor positive ([facility]) Left lumpectomy: Grade 2 IDC with DCIS, 1 cm, margins negative, DCIS focally 0.1 cm from superior margin, 2/2 lymph nodes positive, ER 95%, PR 45%, HER-2 negative, Ki-67 20% AXLND: 3/20 LN Pos (total 5/22)  Treatment Plan: 1. Adj chemo with DD Adriamycin and Cytoxan followed by Taxol weekly X 11 (stopped for neuropathy) completed 04/19/2019 2. Adj XRT 06/01/2019-07/21/2019 3. Adj Anti estrogen therapy --------------------------------------------------------------------------------------------- Current treatment: Anastrozole 1 mg daily Anastrozole counseling:We discussed the risks and benefits of anti-estrogen therapy with aromatase inhibitors. These include but not limited to insomnia, hot flashes, mood changes, vaginal dryness, bone density loss, and weight gain. We strongly believe that the benefits far outweigh the risks. Patient understands these risks and consented to starting treatment. Planned treatment duration is 7 years.  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 Return to clinic in 3 months for survivorship care plan visit   No orders of the defined types were placed in this encounter.  The patient has a good understanding of the overall plan. she agrees with it. she will call with any problems that may develop before the next visit here.  Nicholas Lose, MD 07/26/2019  Julious Oka Dorshimer, am acting as scribe for Dr. Nicholas Lose.  I have reviewed the above documentation for accuracy and completeness, and I agree with the above.

## 2019-07-25 NOTE — Telephone Encounter (Signed)
Scheduled appt per 12/4 sch message - pt to get an updated schedule next visit.

## 2019-07-26 ENCOUNTER — Other Ambulatory Visit: Payer: Self-pay

## 2019-07-26 ENCOUNTER — Inpatient Hospital Stay: Payer: BC Managed Care – PPO | Attending: Hematology and Oncology | Admitting: Hematology and Oncology

## 2019-07-26 DIAGNOSIS — Z79899 Other long term (current) drug therapy: Secondary | ICD-10-CM | POA: Insufficient documentation

## 2019-07-26 DIAGNOSIS — Z170 Estrogen receptor positive status [ER+]: Secondary | ICD-10-CM | POA: Insufficient documentation

## 2019-07-26 DIAGNOSIS — Z9221 Personal history of antineoplastic chemotherapy: Secondary | ICD-10-CM | POA: Insufficient documentation

## 2019-07-26 DIAGNOSIS — Z79811 Long term (current) use of aromatase inhibitors: Secondary | ICD-10-CM | POA: Diagnosis not present

## 2019-07-26 DIAGNOSIS — Z923 Personal history of irradiation: Secondary | ICD-10-CM | POA: Insufficient documentation

## 2019-07-26 DIAGNOSIS — C50412 Malignant neoplasm of upper-outer quadrant of left female breast: Secondary | ICD-10-CM | POA: Diagnosis not present

## 2019-07-26 DIAGNOSIS — I890 Lymphedema, not elsewhere classified: Secondary | ICD-10-CM | POA: Insufficient documentation

## 2019-07-26 DIAGNOSIS — Z791 Long term (current) use of non-steroidal anti-inflammatories (NSAID): Secondary | ICD-10-CM | POA: Insufficient documentation

## 2019-07-26 MED ORDER — ANASTROZOLE 1 MG PO TABS: 1.0000 mg | ORAL_TABLET | Freq: Every day | ORAL | 3 refills | Status: DC

## 2019-07-26 NOTE — Assessment & Plan Note (Signed)
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 Adj chemo with DD Adriamycin and Cytoxan followed by Taxol weekly X 11 (stopped for neuropathy) completed 04/19/2019 2. Adj XRT 06/01/2019-07/21/2019 3. Adj Anti estrogen therapy --------------------------------------------------------------------------------------------- Current treatment: Anastrozole 1 mg daily Anastrozole counseling:We discussed the risks and benefits of anti-estrogen therapy with aromatase inhibitors. These include but not limited to insomnia, hot flashes, mood changes, vaginal dryness, bone density loss, and weight gain. We strongly believe that the benefits far outweigh the risks. Patient understands these risks and consented to starting treatment. Planned treatment duration is 7 years.  Return to clinic in 3 months for survivorship care plan visit

## 2019-08-03 ENCOUNTER — Other Ambulatory Visit: Payer: Self-pay

## 2019-08-03 ENCOUNTER — Encounter: Payer: Self-pay | Admitting: Rehabilitation

## 2019-08-03 ENCOUNTER — Ambulatory Visit: Payer: BC Managed Care – PPO | Attending: Hematology and Oncology | Admitting: Rehabilitation

## 2019-08-03 DIAGNOSIS — I890 Lymphedema, not elsewhere classified: Secondary | ICD-10-CM | POA: Insufficient documentation

## 2019-08-03 DIAGNOSIS — M25612 Stiffness of left shoulder, not elsewhere classified: Secondary | ICD-10-CM | POA: Insufficient documentation

## 2019-08-03 DIAGNOSIS — Z170 Estrogen receptor positive status [ER+]: Secondary | ICD-10-CM | POA: Diagnosis present

## 2019-08-03 DIAGNOSIS — R293 Abnormal posture: Secondary | ICD-10-CM

## 2019-08-03 DIAGNOSIS — C50412 Malignant neoplasm of upper-outer quadrant of left female breast: Secondary | ICD-10-CM

## 2019-08-03 NOTE — Therapy (Signed)
[place], [place], [address] Phone: [phone]   Fax:  [phone]  Physical Therapy Evaluation  Patient Details  Name: Kayla Banks MRN: [id number] Date of Birth: [birth year] Referring Provider (PT): Dr. Erroll Luna   Encounter Date: 08/03/2019  PT End of Session - 08/03/19 1459    Visit Number  1    Number of Visits  13    Date for PT Re-Evaluation  09/14/19    Authorization Type  BCBS    PT Start Time  1402    PT Stop Time  1450    PT Time Calculation (min)  48 min    Activity Tolerance  Patient tolerated treatment well    Behavior During Therapy  [facility] for tasks assessed/performed       Past Medical History:  Diagnosis Date  . Complication of anesthesia   . Family history of breast cancer   . Heart murmur    no problems, saw cardiologist 2010  . Hypertension    on meds  . Hypothyroidism   . Osteoarthritis   . Plantar fasciitis   . PONV (postoperative nausea and vomiting)   . Thyroid disease    Hypothyroidism    Past Surgical History:  Procedure Laterality Date  . ABDOMINAL HYSTERECTOMY  02/2012  . AXILLARY LYMPH NODE DISSECTION Left 11/09/2018   Procedure: LEFT AXILLARY LYMPH NODE DISSECTION;  Surgeon: Erroll Luna, MD;  Location: [place];  Service: General;  Laterality: Left;  . BREAST LUMPECTOMY WITH RADIOACTIVE SEED AND SENTINEL LYMPH NODE BIOPSY Left 10/11/2018   Procedure: LEFT BREAST LUMPECTOMY WITH RADIOACTIVE SEED AND LEFT SENTINEL LYMPH NODE MAPPING WITH LEFT TARGETED LYMPH NODE BIOPSY;  Surgeon: Erroll Luna, MD;  Location: [place];  Service: General;  Laterality: Left;  . FOOT SURGERY Left    bone spurs  . LUMBAR LAMINECTOMY  1990  . PORT-A-CATH REMOVAL N/A 05/23/2019   Procedure: PORT REMOVAL;  Surgeon: Erroll Luna, MD;  Location: [place];  Service: General;  Laterality: N/A;  . PORTACATH PLACEMENT Right  12/01/2018   Procedure: INSERTION PORT-A-CATH WITH ULTRASOUND;  Surgeon: Erroll Luna, MD;  Location: [place];  Service: General;  Laterality: Right;    There were no vitals filed for this visit.   Subjective Assessment - 08/03/19 1411    Subjective  My left breast has been swollen ever since the surgery and then the radiation started to make it worse.    The upper arm has also been swollen as well the lymph node surgery. I am just wearing a loose regular bra.  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 Chemo completed with Adriamycin and taxol and radiation also completed.  Hypothyroidism, HTN, obesity, OA bil knees    Patient Stated Goals  decrease breast swelling is the arm swollen    Currently in Pain?  No/denies   every once in a while I get a brief stabbing pain        [facility] PT Assessment - 08/03/19 0001      Assessment   Medical Diagnosis  Left breast cancer    Referring Provider (PT)  Dr. 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     Has the patient had a decrease in activity level because of a fear of falling?   No    Is the patient reluctant to leave their home because of a fear of falling?   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 i just don't have the energy       Cognition   Overall Cognitive Status  Within Functional Limits for tasks assessed      Observation/Other Assessments   Observations  large pendulus breasts with radiation darkening evident, enlarged pores and healing blisters especially lateral breast, inferior breast, and lateral trunk.  See photo.  Overall well healed but still slightly raw skin    Skin Integrity  no tenderness to the breast with palpation      Sensation   Additional Comments  still numb in the left axilla      Coordination   Gross Motor Movements are Fluid and Coordinated  Yes      Posture/Postural Control   Posture/Postural Control  Postural limitations    Postural Limitations  Rounded Shoulders;Forward head      AROM   Right Shoulder Extension  50 Degrees    Right Shoulder Flexion  140 Degrees    Right Shoulder ABduction  152 Degrees    Right Shoulder Internal Rotation  72 Degrees    Right Shoulder External Rotation  86 Degrees    Right Shoulder Horizontal ABduction  38 Degrees    Left Shoulder Flexion  125 Degrees   pull   Left Shoulder ABduction  120 Degrees   pull   Left Shoulder Internal Rotation  70 Degrees    Left Shoulder External Rotation  65 Degrees    Left Shoulder Horizontal ABduction  38 Degrees            LYMPHEDEMA/ONCOLOGY QUESTIONNAIRE - 08/03/19 1423      Type   Cancer Type  Left breast cancer      Surgeries   Lumpectomy Date  10/11/18    Sentinel Lymph Node Biopsy Date  10/11/18    Axillary Lymph Node Dissection Date  11/09/18    Number Lymph Nodes Removed  22      Treatment   Past Chemotherapy Treatment  Yes    Past Radiation Treatment  Yes    Current Hormone Treatment  No      What other symptoms do you have   Are you Having Heaviness or Tightness  No    Are you having Pain  No    Is it Hard or Difficult finding clothes that fit  No      Right Upper Extremity Lymphedema   15 cm Proximal to Olecranon  Process  35.2 cm  10 cm Proximal to Olecranon Process  34.1 cm    Olecranon Process  27.7 cm    15 cm Proximal to Ulnar Styloid Process  26.6 cm    10 cm Proximal to Ulnar Styloid Process  23 cm    Just Proximal to Ulnar Styloid Process  17.2 cm    Across Hand at [employer]  20 cm    At [place] of 2nd Digit  5.9 cm    Other  10      Left Upper Extremity Lymphedema   15 cm Proximal to Olecranon Process  38.5 cm    10 cm Proximal to Olecranon Process  37.7 cm    Olecranon Process  31 cm    15 cm Proximal to Ulnar Styloid Process  28.2 cm    10 cm Proximal to Ulnar Styloid Process  24.1 cm    Just Proximal to Ulnar Styloid Process  17.5 cm    Across Hand at [employer]  20.2 cm    At [place] of 2nd Digit  5.9 cm    Other  10             Objective measurements completed on examination: See above findings.              PT Education - 08/03/19 1459    Education Details  make an appt at [facility] for bras    Person(s) Educated  Patient    Methods  Explanation    Comprehension  Verbalized understanding          PT Long Term Goals - 08/03/19 1506      PT LONG TERM GOAL #1   Title  Pt will be ind with self breast and upper arm MLD or obtain compression pump    Time  6    Period  Weeks    Status  New      PT LONG TERM GOAL #2   Title  Pt will obtain appropriate bra and sleeve for lymphedema management    Time  6    Period  Weeks    Status  New      PT LONG TERM GOAL #3   Title  Pt will demonstrate low fall risk on the BERG balance scale or similar in the clinic    Time  6    Period  Weeks    Status  New             Plan - 08/03/19 1500    Clinical Impression Statement  Pt presents to PT healing from significant radiation dermatitis and blistering on the left breast which has worsened swelling that she reports has been present since surgery.  Overall the breast is healed but pt was encouraged to keep applying alot of moisturizer.  The  breast is also slightly red from radiation but pt seems like she will be able to tolerate MLD treatment and is willing to try.  UE measurements are larger in the left arm from baseline at the proximal upper arm but unchanged from the olecranon down.  This may be from beginning UE lymphedema or from overflow from the breast radiation healing.  UE ROM of the left shoulder is also limited with axillary pull still felt and pt reports fatigue and poor balance from chemotherapy.    Personal Factors and Comorbidities  Age;Fitness;Comorbidity 2    Comorbidities  radiation, ALND    Examination-Activity Limitations  Lift    Examination-Participation  Restrictions  Yard Work;Community Activity    Stability/Clinical Decision Making  Evolving/Moderate complexity    Clinical Decision Making  Moderate    Rehab Potential  Excellent    Clinical Impairments Affecting Rehab Potential  ALND, radiation, size of breast    PT Frequency  2x / week    PT Duration  6 weeks    PT Next Visit Plan  get any bras?, need foam? begin left breast MLD assessing skin and breast tolerance to treatment also working on left upper arm clearance, Lt UE PROM/AAROM, balance screen PRN with goal as needed    Recommended Other Services  compression bras and sleeve    Consulted and Agree with Plan of Care  Patient       Patient will benefit from skilled therapeutic intervention in order to improve the following deficits and impairments:  Impaired UE functional use, Pain, Postural dysfunction, Decreased knowledge of precautions, Decreased range of motion, Increased edema, Decreased skin integrity  Visit Diagnosis: Malignant neoplasm of upper-outer quadrant of left breast in female, estrogen receptor positive (HCC)  Abnormal posture  Stiffness of left shoulder, not elsewhere classified  Lymphedema, not elsewhere classified     Problem List Patient Active Problem List   Diagnosis Date Noted  . Chemotherapy-induced peripheral  neuropathy ([place]) 03/30/2019  . Port-A-Cath in place 12/15/2018  . Genetic testing 09/21/2018  . Family history of breast cancer   . Malignant neoplasm of upper-outer quadrant of left breast in female, estrogen receptor positive ([place]) 08/29/2018  . Hyperlipidemia 08/04/2018  . Obesity (BMI 30-39.9) 05/09/2013  . Preventative health care 11/19/2010  . URI (upper respiratory infection) 11/19/2010  . KNEE PAIN, BILATERAL 09/25/2010  . SINUSITIS - ACUTE-NOS 12/24/2009  . MORBID OBESITY 06/14/2009  . COLD SORE 10/16/2008  . SKIN TAG 06/18/2008  . Hypothyroidism 03/17/2007  . Essential hypertension 03/17/2007    Stark Bray 08/03/2019, 3:07 PM  [place], [place], [address] Phone: [phone]   Fax:  [phone]  Name: Kayla Banks MRN: [id number] Date of Birth: 1956-04-23

## 2019-08-08 ENCOUNTER — Encounter: Payer: Self-pay | Admitting: *Deleted

## 2019-08-09 ENCOUNTER — Telehealth: Payer: Self-pay | Admitting: Radiation Oncology

## 2019-08-09 NOTE — Telephone Encounter (Signed)
  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  Interval Since Last Radiation: 3 weeks   06/01/2019-07/21/2019: The left breast and regional nodes were treated to 43.2 Gy over 24 fractions Gy, followed by 7.2 Gy over 4 fractions after replanning and a 10 Gy boost over 5 fractions.    Narrative:  The patient was contacted today for routine follow-up. During treatment she did very well with radiotherapy though she did have significant desquamation requiring gentian violet application. She reports she is doing well and reports her skin is almost completely healed. She does have persistent hyperpigmentation and is using sonafine.  Impression/Plan: 1. 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 The patient has been doing well since completion of radiotherapy. We discussed that we would be happy to continue to follow her as needed, but she will also continue to follow up with Dr. Lindi Adie in medical oncology. She was counseled on skin care as well as measures to avoid sun exposure to this area.  2. Survivorship. We discussed the importance of survivorship evaluation and she is scheduled to be seen in March 2021.     Carola Rhine, PAC

## 2019-09-20 ENCOUNTER — Encounter: Payer: Self-pay | Admitting: Rehabilitation

## 2019-09-20 ENCOUNTER — Ambulatory Visit: Payer: BC Managed Care – PPO | Attending: Hematology and Oncology | Admitting: Rehabilitation

## 2019-09-20 ENCOUNTER — Other Ambulatory Visit: Payer: Self-pay

## 2019-09-20 DIAGNOSIS — R293 Abnormal posture: Secondary | ICD-10-CM | POA: Insufficient documentation

## 2019-09-20 DIAGNOSIS — M25611 Stiffness of right shoulder, not elsewhere classified: Secondary | ICD-10-CM | POA: Insufficient documentation

## 2019-09-20 DIAGNOSIS — M25612 Stiffness of left shoulder, not elsewhere classified: Secondary | ICD-10-CM | POA: Insufficient documentation

## 2019-09-20 DIAGNOSIS — C50412 Malignant neoplasm of upper-outer quadrant of left female breast: Secondary | ICD-10-CM | POA: Insufficient documentation

## 2019-09-20 DIAGNOSIS — I890 Lymphedema, not elsewhere classified: Secondary | ICD-10-CM | POA: Diagnosis present

## 2019-09-20 DIAGNOSIS — Z170 Estrogen receptor positive status [ER+]: Secondary | ICD-10-CM | POA: Diagnosis present

## 2019-09-20 NOTE — Patient Instructions (Signed)
Self manual lymph drainage: Perform this sequence once a day.  Only give enough pressure no your skin to make the skin move.  Diaphragmatic - Supine   Inhale through nose making navel move out toward hands. Exhale through puckered lips, hands follow navel in. Repeat _5__ times.      Copyright  VHI. All rights reserved.  Hug yourself.  Do circles at your neck just above your collarbones.  Repeat this 10 times.  Axilla - One at a Time   Using full weight of flat hand and fingers at center of uninvolved armpit, make _10__ in-place circles.   Copyright  VHI. All rights reserved.  LEG: Inguinal Nodes Stimulation   With small finger side of hand against hip crease on involved side, gently perform circles at the crease. Repeat __10_ times.   Copyright  VHI. All rights reserved.  1) Axilla to Inguinal Nodes - Sweep   On involved side, sweep _4__ times from armpit along side of trunk to hip crease.    Now gently stretch skin from the involved side to the uninvolved side across the chest at the shoulder line.  Repeat that 4 times.    Draw an imaginary diagonal line from upper outer breast through the nipple area toward lower inner breast.  Direct fluid upward and inward from this line toward the pathway across your upper chest .  Do this in three rows to treat all of the upper inner breast tissue, and do each row 3-4x.        Direct fluid to treat all of lower outer breast tissue downward and outward toward pathway that is aimed at the left groin.    Finish by doing the pathways as described above going from your involved armpit to the same side groin and going across your upper chest from the involved shoulder to the uninvolved shoulder.  Repeat the steps above where you do circles in your left groin and right armpit. Copyright  VHI. All rights reserved.

## 2019-09-20 NOTE — Therapy (Signed)
[place], [place], [address] Phone: [phone]   Fax:  [phone]  Physical Therapy Treatment  Patient Details  Name: Kayla Banks MRN: [id number] Date of Birth: 09/07/1955 Referring Provider (PT): Dr. Erroll Luna   Encounter Date: 09/20/2019  PT End of Session - 09/20/19 1044    Visit Number  2    Number of Visits  13    Date for PT Re-Evaluation  09/14/19    Authorization Type  BCBS    PT Start Time  0900    PT Stop Time  0954    PT Time Calculation (min)  54 min    Activity Tolerance  Patient tolerated treatment well    Behavior During Therapy  [facility] for tasks assessed/performed       Past Medical History:  Diagnosis Date  . Complication of anesthesia   . Family history of breast cancer   . Heart murmur    no problems, saw cardiologist 2010  . Hypertension    on meds  . Hypothyroidism   . Osteoarthritis   . Plantar fasciitis   . PONV (postoperative nausea and vomiting)   . Thyroid disease    Hypothyroidism    Past Surgical History:  Procedure Laterality Date  . ABDOMINAL HYSTERECTOMY  02/2012  . AXILLARY LYMPH NODE DISSECTION Left 11/09/2018   Procedure: LEFT AXILLARY LYMPH NODE DISSECTION;  Surgeon: Erroll Luna, MD;  Location: [place];  Service: General;  Laterality: Left;  . BREAST LUMPECTOMY WITH RADIOACTIVE SEED AND SENTINEL LYMPH NODE BIOPSY Left 10/11/2018   Procedure: LEFT BREAST LUMPECTOMY WITH RADIOACTIVE SEED AND LEFT SENTINEL LYMPH NODE MAPPING WITH LEFT TARGETED LYMPH NODE BIOPSY;  Surgeon: Erroll Luna, MD;  Location: [place];  Service: General;  Laterality: Left;  . FOOT SURGERY Left    bone spurs  . LUMBAR LAMINECTOMY  1990  . PORT-A-CATH REMOVAL N/A 05/23/2019   Procedure: PORT REMOVAL;  Surgeon: Erroll Luna, MD;  Location: [place];  Service: General;  Laterality: N/A;  . PORTACATH PLACEMENT Right  12/01/2018   Procedure: INSERTION PORT-A-CATH WITH ULTRASOUND;  Surgeon: Erroll Luna, MD;  Location: [place];  Service: General;  Laterality: Right;    There were no vitals filed for this visit.  Subjective Assessment - 09/20/19 0857    Subjective  I have an appointment to get bras today.  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 Chemo completed with Adriamycin and taxol and radiation also completed.  Hypothyroidism, HTN, obesity, OA bil knees    Patient Stated Goals  decrease breast swelling is the arm swollen    Currently in Pain?  No/denies                  Outpatient Re[facility]  Lymphedema Life Impact Scale Total Score  27.94 %           [facility] Adult PT Treatment/Exercise - 09/20/19 0001      Manual Therapy   Manual Therapy  Passive ROM;Manual Lymphatic Drainage (MLD);Edema management    Edema Management  made 1/2" gray foam rectangle for bra that pt will hopefully get today    Manual Lymphatic Drainage (MLD)  initial education on breast MLD, pathways, and overview with pt approval of treatment.  Education to patient on MLD while performing.  short neck, superficial and deep abdominals, breathing, Rt axillary nodes, interaxillary pathway, left inguinal nodes, left axillo inguinal pathway then superior and anterior breast toward the axillary nodes and lateral and inferior breast toward the inguinal nodes with pt assisting with breast positioning due to size.  Then in right sidelying lateral breast work and axillo inguinal and posterior interaxillary pathway.  Then upper arm lateral and medial to lateral and then reversing all pathways.  Given handout on the breast portion of the massage with instruction     Passive ROM  of the left shoulder briefly into flexion and abduction             PT Education - 09/20/19 1043    Education Details  MLD, breast MLD sequence    Person(s) Educated  Patient    Methods  Explanation;Demonstration;Tactile cues;Verbal cues;Handout    Comprehension  Verbalized understanding;Returned demonstration;Verbal cues required;Tactile cues required;Need further instruction          PT Long Term Goals - 09/20/19 1048      PT LONG TERM GOAL #1   Title  Pt will be ind with self breast and upper arm MLD or obtain compression pump    Status  On-going      PT LONG TERM GOAL #2   Title  Pt will obtain appropriate bra and sleeve for lymphedema management    Status  On-going      PT LONG TERM GOAL #3   Title  Pt will demonstrate low fall risk on the BERG balance scale or similar in the clinic    Status  On-going      PT LONG TERM GOAL #4   Title  Pt will decrease LLIS to 17% or less    Status  On-going            Plan - 09/20/19 1044    Clinical Impression Statement  First session of MLD today for the left breast and left upper arm.  The left breast is overall edematous with some fibrosis at the inferior lateral line from the nipple to the trunk but no pain overall.  Decreased shoulder ROM evident with PROM with some pulling. Pt reports decreased tightness post treatment    PT Frequency  2x / week    PT Duration  6 weeks    PT Treatment/Interventions  ADLs/Self Care Home Management;Patient/family education;Therapeutic exercise    PT Next Visit Plan  get any bras?, how was foam? chip pack or KT? try left breast MLD?  cont left breast MLD also working on left upper arm clearance, Lt UE PROM/AAROM, balance screen PRN with goal as needed    Consulted and Agree with Plan of Care  Patient       Patient will benefit from skilled therapeutic intervention in order to improve the following deficits and impairments:     Visit Diagnosis: Malignant neoplasm of  upper-outer quadrant of left breast in female, estrogen receptor positive (HCC)  Abnormal posture  Stiffness of left shoulder, not elsewhere classified  Stiffness of right shoulder, not elsewhere classified  Lymphedema, not elsewhere classified     Problem List Patient Active Problem List   Diagnosis Date Noted  . Chemotherapy-induced peripheral neuropathy ([place]) 03/30/2019  . Port-A-Cath in place 12/15/2018  . Genetic testing 09/21/2018  . Family history of breast cancer   . Malignant neoplasm of  upper-outer quadrant of left breast in female, estrogen receptor positive ([place]) 08/29/2018  . Hyperlipidemia 08/04/2018  . Obesity (BMI 30-39.9) 05/09/2013  . Preventative health care 11/19/2010  . URI (upper respiratory infection) 11/19/2010  . KNEE PAIN, BILATERAL 09/25/2010  . SINUSITIS - ACUTE-NOS 12/24/2009  . MORBID OBESITY 06/14/2009  . COLD SORE 10/16/2008  . SKIN TAG 06/18/2008  . Hypothyroidism 03/17/2007  . Essential hypertension 03/17/2007    Stark Bray 09/20/2019, 10:49 AM  [place], [place], [address] Phone: [phone]   Fax:  [phone]  Name: Runette Scifres MRN: [id number] Date of Birth: 1956/05/05

## 2019-09-22 ENCOUNTER — Ambulatory Visit: Payer: BC Managed Care – PPO | Admitting: Rehabilitation

## 2019-09-22 ENCOUNTER — Encounter: Payer: Self-pay | Admitting: Rehabilitation

## 2019-09-22 ENCOUNTER — Other Ambulatory Visit: Payer: Self-pay

## 2019-09-22 DIAGNOSIS — M25612 Stiffness of left shoulder, not elsewhere classified: Secondary | ICD-10-CM

## 2019-09-22 DIAGNOSIS — I890 Lymphedema, not elsewhere classified: Secondary | ICD-10-CM

## 2019-09-22 DIAGNOSIS — C50412 Malignant neoplasm of upper-outer quadrant of left female breast: Secondary | ICD-10-CM

## 2019-09-22 DIAGNOSIS — R293 Abnormal posture: Secondary | ICD-10-CM

## 2019-09-22 DIAGNOSIS — M25611 Stiffness of right shoulder, not elsewhere classified: Secondary | ICD-10-CM

## 2019-09-22 NOTE — Therapy (Signed)
[place], [place], [address] Phone: [phone]   Fax:  [phone]  Physical Therapy Treatment  Patient Details  Name: Kayla Banks MRN: [id number] Date of Birth: August 06, 1956 Referring Provider (PT): Dr. Erroll Luna   Encounter Date: 09/22/2019  PT End of Session - 09/22/19 0956    Visit Number  3    Number of Visits  13    Date for PT Re-Evaluation  09/14/19    Authorization Type  BCBS    PT Start Time  0900    PT Stop Time  0953    PT Time Calculation (min)  53 min    Activity Tolerance  Patient tolerated treatment well    Behavior During Therapy  [facility] for tasks assessed/performed       Past Medical History:  Diagnosis Date  . Complication of anesthesia   . Family history of breast cancer   . Heart murmur    no problems, saw cardiologist 2010  . Hypertension    on meds  . Hypothyroidism   . Osteoarthritis   . Plantar fasciitis   . PONV (postoperative nausea and vomiting)   . Thyroid disease    Hypothyroidism    Past Surgical History:  Procedure Laterality Date  . ABDOMINAL HYSTERECTOMY  02/2012  . AXILLARY LYMPH NODE DISSECTION Left 11/09/2018   Procedure: LEFT AXILLARY LYMPH NODE DISSECTION;  Surgeon: Erroll Luna, MD;  Location: [place];  Service: General;  Laterality: Left;  . BREAST LUMPECTOMY WITH RADIOACTIVE SEED AND SENTINEL LYMPH NODE BIOPSY Left 10/11/2018   Procedure: LEFT BREAST LUMPECTOMY WITH RADIOACTIVE SEED AND LEFT SENTINEL LYMPH NODE MAPPING WITH LEFT TARGETED LYMPH NODE BIOPSY;  Surgeon: Erroll Luna, MD;  Location: [place];  Service: General;  Laterality: Left;  . FOOT SURGERY Left    bone spurs  . LUMBAR LAMINECTOMY  1990  . PORT-A-CATH REMOVAL N/A 05/23/2019   Procedure: PORT REMOVAL;  Surgeon: Erroll Luna, MD;  Location: [place];  Service: General;  Laterality: N/A;  . PORTACATH PLACEMENT Right  12/01/2018   Procedure: INSERTION PORT-A-CATH WITH ULTRASOUND;  Surgeon: Erroll Luna, MD;  Location: [place];  Service: General;  Laterality: Right;    There were no vitals filed for this visit.  Subjective Assessment - 09/22/19 0855    Subjective  The compression bras would just not work they would just not stay inside the bra.  I was able to just get a regular bra with more support.    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 Chemo completed with Adriamycin and taxol and radiation also completed.  Hypothyroidism, HTN, obesity, OA bil knees    Currently in Pain?  No/denies                  [facility]patient [facility]  Lymphedema Life Impact Scale Total Score  27.94 %           [facility] Adult PT Treatment/Exercise - 09/22/19 0001      Manual Therapy   Edema Management  pts bra is not a compression bra but will give her more support and should accomodate the foam until she can talk to [facility] next week about more options    Manual Lymphatic Drainage (MLD)  seated education on arm component of MLD with all steps performed and feedback  as needed; Then PT performance of supine breast and left UE MLD:  short neck, superficial and deep abdominals, breathing, Rt axillary nodes, interaxillary pathway, left inguinal nodes, left axillo inguinal pathway then superior and anterior breast toward the axillary nodes and lateral and inferior breast toward the inguinal nodes with pt assisting with breast positioning due to size.  Then in right sidelying lateral breast work and axillo inguinal and posterior interaxillary pathway.  Then upper arm lateral and medial to lateral and then reversing all pathways.                   PT Long Term Goals - 09/20/19 1048      PT LONG TERM GOAL #1   Title  Pt will be ind with self  breast and upper arm MLD or obtain compression pump    Status  On-going      PT LONG TERM GOAL #2   Title  Pt will obtain appropriate bra and sleeve for lymphedema management    Status  On-going      PT LONG TERM GOAL #3   Title  Pt will demonstrate low fall risk on the BERG balance scale or similar in the clinic    Status  On-going      PT LONG TERM GOAL #4   Title  Pt will decrease LLIS to 17% or less    Status  On-going            Plan - 09/22/19 0956    Clinical Impression Statement  Continued left breast and arm MLD.  Noticed that pt does seem to have some puffiness inthe forearm which did not have any size difference at eval so pt was educated on self MLD for the whole UE.  firmness and decreased skin mobility under the breast most noted with patient holding the breast up and still laterally in sidelying.    PT Frequency  2x / week    PT Duration  6 weeks    PT Treatment/Interventions  ADLs/Self Care Home Management;Patient/family education;Therapeutic exercise    PT Next Visit Plan  how was foam? chip pack or KT?  cont left breast MLD also working on left upper arm clearance, Lt UE PROM/AAROM, balance screen PRN with goal as needed    Consulted and Agree with Plan of Care  Patient       Patient will benefit from skilled therapeutic intervention in order to improve the following deficits and impairments:     Visit Diagnosis: Malignant neoplasm of upper-outer quadrant of left breast in female, estrogen receptor positive (HCC)  Abnormal posture  Stiffness of left shoulder, not elsewhere classified  Stiffness of right shoulder, not elsewhere classified  Lymphedema, not elsewhere classified     Problem List Patient Active Problem List   Diagnosis Date Noted  . Chemotherapy-induced peripheral neuropathy ([place]) 03/30/2019  . Port-A-Cath in place 12/15/2018  . Genetic testing 09/21/2018  . Family history of breast cancer   . Malignant neoplasm of upper-outer  quadrant of left breast in female, estrogen receptor positive ([place]) 08/29/2018  . Hyperlipidemia 08/04/2018  . Obesity (BMI 30-39.9) 05/09/2013  . Preventative health care 11/19/2010  . URI (upper respiratory infection) 11/19/2010  . KNEE PAIN, BILATERAL 09/25/2010  . SINUSITIS - ACUTE-NOS 12/24/2009  . MORBID OBESITY 06/14/2009  . COLD SORE 10/16/2008  . SKIN TAG 06/18/2008  . Hypothyroidism 03/17/2007  . Essential hypertension 03/17/2007    Stark Bray 09/22/2019, 9:59 AM  [facility]  [place], [place], [address] Phone: [phone]   Fax:  [phone]  Name: Kayla Banks MRN: [id number] Date of Birth: 08-30-1955

## 2019-09-22 NOTE — Patient Instructions (Signed)
   opyright  [employer]. All rights reserved.  Arm Posterior: Elbow to Shoulder - Sweep   Pump _5__ times from back of elbow to top of shoulder.  Then inner to outer upper arm _5_ times, Then back to the pathways _2-3_ times.   ARM: Volar Wrist to Elbow - Sweep   Pump or stationary circles _5__ times from wrist to elbow making sure to do both sides of the forearm.   Then retrace your steps to the outer arm, and the pathways _2-3_ times each.  ARM: Dorsum of Hand to Shoulder - Sweep   Pump or stationary circles _5__ times on back of hand including knuckle spaces and individual fingers if needed working up towards the wrist  Then retrace all your steps working back up the forearm, doing both sides; upper outer arm and back to your pathways _2-3_ times each.  Then do 5 circles again at uninvolved armpit and involved groin where you started! Good job!!

## 2019-09-25 ENCOUNTER — Other Ambulatory Visit: Payer: Self-pay | Admitting: Family Medicine

## 2019-09-25 DIAGNOSIS — E039 Hypothyroidism, unspecified: Secondary | ICD-10-CM

## 2019-09-25 DIAGNOSIS — M170 Bilateral primary osteoarthritis of knee: Secondary | ICD-10-CM

## 2019-09-25 DIAGNOSIS — Z8619 Personal history of other infectious and parasitic diseases: Secondary | ICD-10-CM

## 2019-09-27 ENCOUNTER — Ambulatory Visit: Payer: BC Managed Care – PPO

## 2019-09-27 ENCOUNTER — Other Ambulatory Visit: Payer: Self-pay

## 2019-09-27 DIAGNOSIS — C50412 Malignant neoplasm of upper-outer quadrant of left female breast: Secondary | ICD-10-CM

## 2019-09-27 DIAGNOSIS — M25612 Stiffness of left shoulder, not elsewhere classified: Secondary | ICD-10-CM

## 2019-09-27 DIAGNOSIS — R293 Abnormal posture: Secondary | ICD-10-CM

## 2019-09-27 DIAGNOSIS — M25611 Stiffness of right shoulder, not elsewhere classified: Secondary | ICD-10-CM

## 2019-09-27 DIAGNOSIS — I890 Lymphedema, not elsewhere classified: Secondary | ICD-10-CM

## 2019-09-27 NOTE — Therapy (Signed)
[place], [place], [address] Phone: [phone]   Fax:  [phone]  Physical Therapy Treatment  Patient Details  Name: Kayla Banks MRN: [id number] Date of Birth: March 11, 1956 Referring Provider (PT): Dr. Erroll Luna   Encounter Date: 09/27/2019  PT End of Session - 09/27/19 0902    Visit Number  4    Number of Visits  13    Date for PT Re-Evaluation  09/14/19    Authorization Type  BCBS    PT Start Time  0902    PT Stop Time  1003    PT Time Calculation (min)  61 min    Activity Tolerance  Patient tolerated treatment well    Behavior During Therapy  [facility] for tasks assessed/performed       Past Medical History:  Diagnosis Date  . Complication of anesthesia   . Family history of breast cancer   . Heart murmur    no problems, saw cardiologist 2010  . Hypertension    on meds  . Hypothyroidism   . Osteoarthritis   . Plantar fasciitis   . PONV (postoperative nausea and vomiting)   . Thyroid disease    Hypothyroidism    Past Surgical History:  Procedure Laterality Date  . ABDOMINAL HYSTERECTOMY  02/2012  . AXILLARY LYMPH NODE DISSECTION Left 11/09/2018   Procedure: LEFT AXILLARY LYMPH NODE DISSECTION;  Surgeon: Erroll Luna, MD;  Location: [place];  Service: General;  Laterality: Left;  . BREAST LUMPECTOMY WITH RADIOACTIVE SEED AND SENTINEL LYMPH NODE BIOPSY Left 10/11/2018   Procedure: LEFT BREAST LUMPECTOMY WITH RADIOACTIVE SEED AND LEFT SENTINEL LYMPH NODE MAPPING WITH LEFT TARGETED LYMPH NODE BIOPSY;  Surgeon: Erroll Luna, MD;  Location: [place];  Service: General;  Laterality: Left;  . FOOT SURGERY Left    bone spurs  . LUMBAR LAMINECTOMY  1990  . PORT-A-CATH REMOVAL N/A 05/23/2019   Procedure: PORT REMOVAL;  Surgeon: Erroll Luna, MD;  Location: [place];  Service: General;  Laterality: N/A;  . PORTACATH PLACEMENT Right  12/01/2018   Procedure: INSERTION PORT-A-CATH WITH ULTRASOUND;  Surgeon: Erroll Luna, MD;  Location: [place];  Service: General;  Laterality: Right;    There were no vitals filed for this visit.  Subjective Assessment - 09/27/19 0903    Subjective  Pt reports that today is the first time she has worn the chip pack. It feels pretty comfortable but took a minute to get it positioned.    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 Chemo completed with Adriamycin and taxol and radiation also completed.  Hypothyroidism, HTN, obesity, OA bil knees    Patient Stated Goals  decrease breast swelling is the arm swollen    Currently in Pain?  No/denies    Pain Score  0-No pain         [facility] PT Assessment - 09/27/19 0001      Balance   Balance Assessed  Yes      Standardized Balance Assessment   Standardized Balance Assessment  Berg Balance Test      Berg Balance Test   Sit to Stand  Able to stand without using hands and stabilize independently    Standing Unsupported  Able to stand safely 2 minutes    Sitting with Back Unsupported but Feet Supported on Floor or Stool  Able to  sit safely and securely 2 minutes    Stand to Sit  Controls descent by using hands    Transfers  Able to transfer safely, definite need of hands    Standing Unsupported with Eyes Closed  Able to stand 10 seconds safely    Standing Unsupported with Feet Together  Able to place feet together independently and stand 1 minute safely    From Standing, Reach Forward with Outstretched Arm  Can reach forward >12 cm safely (5")    From Standing Position, Pick up Object from Floor  Able to pick up shoe, needs supervision    From Standing Position, Turn to Look Behind Over each Shoulder  Turn sideways only but maintains balance    Turn 360 Degrees  Able to turn 360 degrees safely but slowly    Standing Unsupported,  Alternately Place Feet on Step/Stool  Able to stand independently and complete 8 steps >20 seconds    Standing Unsupported, One Foot in Front  Able to plae foot ahead of the other independently and hold 30 seconds    Standing on One Leg  Able to lift leg independently and hold equal to or more than 3 seconds    Total Score  44              Outpatient Re[facility]  Lymphedema Life Impact Scale Total Score  27.94 %           [facility] Adult PT Treatment/Exercise - 09/27/19 0001      Manual Therapy   Manual Therapy  Manual Lymphatic Drainage (MLD)    Manual Lymphatic Drainage (MLD)  In supine: short neck, swimming in the terminus, Bil shoulders, Bil axillary and L inguinal nodes, established anterior inter-axillary anastomosis and L axillo-inguinal anastomosis, superior breast toward the anterior inter-axillary anastomosis then re-worked anastomosis, inferior breast toward the L axillo-inguinal anastomosis then re-worked anastomosis, extra time spent at the superior and inferior breast then medial to lateral L brachium, lateral L brachium, L shoulder; reworked all surfaces then deep abodminals. Following physical therapist performance of MLD pt was taken through self MLD with demonstration and tactile cueing for correct presure, skin stretch andhand placement for ease of performance. Pt was able to verbally walk physical therapist through self MLD of the L breast by end of treatment session.              PT Education - 09/27/19 0958    Education Details  Pt will work on MLD of the L breast at home and wear gray 1/2 inch foam over the inferior portion of her breast. Pt was educated on risk for falls associated with BERG score.    Person(s) Educated  Patient    Methods  Explanation;Demonstration;Tactile cues;Verbal cues    Comprehension  Verbalized understanding;Returned demonstration          PT Long Term Goals -  09/20/19 1048      PT LONG TERM GOAL #1   Title  Pt will be ind with self breast and upper arm MLD or obtain compression pump    Status  On-going      PT LONG TERM GOAL #2   Title  Pt will obtain appropriate bra and sleeve for lymphedema management    Status  On-going      PT LONG TERM GOAL #3   Title  Pt will demonstrate low fall risk on the BERG balance scale or similar in the clinic  Status  On-going      PT LONG TERM GOAL #4   Title  Pt will decrease LLIS to 17% or less    Status  On-going            Plan - 09/27/19 0902    Clinical Impression Statement  Pt presents with continued edema in her L Breast that decreases minimally following MLD. Fluid improves significantly with lifting breast up in supine due to positioning to allow fluid flow out of the area. Discussed this with patient and that lifting her breast will help her get fluid out of the area as well as performing MLD in reclined/supine. Pt was walked through each step of MLD in order to ensure proficiency. Soft tissue work performed over fibrotic tissue under the incision with slight improvement following STM. Pt was assisted with placing 1/2 inch gray foam in her bra on the inferior portion of the breast due to this is where she has most of her swelling. BERG balance test was performed today to assess balance with pt scoring 44 putting her at a high risk for falls. Pt will benefit from continued POC at this time.    Personal Factors and Comorbidities  Age;Fitness;Comorbidity 2    Comorbidities  radiation, ALND    Examination-Activity Limitations  Lift    Examination-Participation Restrictions  Yard Work;Community Activity    Rehab Potential  Excellent    Clinical Impairments Affecting Rehab Potential  ALND, radiation, size of breast    PT Frequency  2x / week    PT Duration  6 weeks    PT Treatment/Interventions  ADLs/Self Care Home Management;Patient/family education;Therapeutic exercise    PT Next Visit Plan   how was new foam placement:  cont left breast MLD also working on left upper arm clearance, Lt UE PROM/AAROM, balance exercises for HEP    PT Home Exercise Plan  Post op shoulder ROM HEP    Consulted and Agree with Plan of Care  Patient       Patient will benefit from skilled therapeutic intervention in order to improve the following deficits and impairments:  Impaired UE functional use, Pain, Postural dysfunction, Decreased knowledge of precautions, Decreased range of motion, Increased edema, Decreased skin integrity  Visit Diagnosis: Malignant neoplasm of upper-outer quadrant of left breast in female, estrogen receptor positive (HCC)  Abnormal posture  Stiffness of left shoulder, not elsewhere classified  Stiffness of right shoulder, not elsewhere classified  Lymphedema, not elsewhere classified     Problem List Patient Active Problem List   Diagnosis Date Noted  . Chemotherapy-induced peripheral neuropathy ([place]) 03/30/2019  . Port-A-Cath in place 12/15/2018  . Genetic testing 09/21/2018  . Family history of breast cancer   . Malignant neoplasm of upper-outer quadrant of left breast in female, estrogen receptor positive ([place]) 08/29/2018  . Hyperlipidemia 08/04/2018  . Obesity (BMI 30-39.9) 05/09/2013  . Preventative health care 11/19/2010  . URI (upper respiratory infection) 11/19/2010  . KNEE PAIN, BILATERAL 09/25/2010  . SINUSITIS - ACUTE-NOS 12/24/2009  . MORBID OBESITY 06/14/2009  . COLD SORE 10/16/2008  . SKIN TAG 06/18/2008  . Hypothyroidism 03/17/2007  . Essential hypertension 03/17/2007    Ander Purpura, PT 09/27/2019, 10:46 AM  [place], [place], [address] Phone: [phone]   Fax:  [phone]  Name: Kayla Banks MRN: [id number] Date of Birth: 1956/06/14

## 2019-09-28 ENCOUNTER — Other Ambulatory Visit: Payer: Self-pay | Admitting: *Deleted

## 2019-09-28 DIAGNOSIS — I10 Essential (primary) hypertension: Secondary | ICD-10-CM

## 2019-09-28 MED ORDER — CARVEDILOL 25 MG PO TABS: ORAL_TABLET | ORAL | 0 refills | Status: DC

## 2019-09-28 MED ORDER — AMLODIPINE BESYLATE 5 MG PO TABS: 5.0000 mg | ORAL_TABLET | Freq: Every day | ORAL | 0 refills | Status: DC

## 2019-09-29 ENCOUNTER — Other Ambulatory Visit: Payer: Self-pay

## 2019-09-29 ENCOUNTER — Ambulatory Visit: Payer: BC Managed Care – PPO | Admitting: Rehabilitation

## 2019-09-29 ENCOUNTER — Encounter: Payer: Self-pay | Admitting: Rehabilitation

## 2019-09-29 DIAGNOSIS — R293 Abnormal posture: Secondary | ICD-10-CM

## 2019-09-29 DIAGNOSIS — M25611 Stiffness of right shoulder, not elsewhere classified: Secondary | ICD-10-CM

## 2019-09-29 DIAGNOSIS — C50412 Malignant neoplasm of upper-outer quadrant of left female breast: Secondary | ICD-10-CM | POA: Diagnosis not present

## 2019-09-29 DIAGNOSIS — M25612 Stiffness of left shoulder, not elsewhere classified: Secondary | ICD-10-CM

## 2019-09-29 DIAGNOSIS — I890 Lymphedema, not elsewhere classified: Secondary | ICD-10-CM

## 2019-09-29 NOTE — Therapy (Signed)
[place], [place], [address] Phone: [phone]   Fax:  [phone]  Physical Therapy Treatment  Patient Details  Name: Kayla Banks MRN: [id number] Date of Birth: 1956/03/26 Referring Provider (PT): Dr. Erroll Luna   Encounter Date: 09/29/2019  PT End of Session - 09/29/19 1055    Visit Number  5    Number of Visits  13    Date for PT Re-Evaluation  09/14/19    PT Start Time  0932   getting measured   PT Stop Time  0955    PT Time Calculation (min)  23 min    Activity Tolerance  Patient tolerated treatment well    Behavior During Therapy  [facility] for tasks assessed/performed       Past Medical History:  Diagnosis Date  . Complication of anesthesia   . Family history of breast cancer   . Heart murmur    no problems, saw cardiologist 2010  . Hypertension    on meds  . Hypothyroidism   . Osteoarthritis   . Plantar fasciitis   . PONV (postoperative nausea and vomiting)   . Thyroid disease    Hypothyroidism    Past Surgical History:  Procedure Laterality Date  . ABDOMINAL HYSTERECTOMY  02/2012  . AXILLARY LYMPH NODE DISSECTION Left 11/09/2018   Procedure: LEFT AXILLARY LYMPH NODE DISSECTION;  Surgeon: Erroll Luna, MD;  Location: [place];  Service: General;  Laterality: Left;  . BREAST LUMPECTOMY WITH RADIOACTIVE SEED AND SENTINEL LYMPH NODE BIOPSY Left 10/11/2018   Procedure: LEFT BREAST LUMPECTOMY WITH RADIOACTIVE SEED AND LEFT SENTINEL LYMPH NODE MAPPING WITH LEFT TARGETED LYMPH NODE BIOPSY;  Surgeon: Erroll Luna, MD;  Location: [place];  Service: General;  Laterality: Left;  . FOOT SURGERY Left    bone spurs  . LUMBAR LAMINECTOMY  1990  . PORT-A-CATH REMOVAL N/A 05/23/2019   Procedure: PORT REMOVAL;  Surgeon: Erroll Luna, MD;  Location: [place];  Service: General;  Laterality: N/A;  . PORTACATH PLACEMENT Right 12/01/2018    Procedure: INSERTION PORT-A-CATH WITH ULTRASOUND;  Surgeon: Erroll Luna, MD;  Location: [place];  Service: General;  Laterality: Right;    There were no vitals filed for this visit.  Subjective Assessment - 09/29/19 0956    Subjective  Pt comes [facility] getting [facility] by [facility].  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 Chemo completed with Adriamycin and taxol and radiation also completed.  Hypothyroidism, HTN, obesity, OA bil knees    Patient Stated Goals  decrease breast swelling is the arm swollen    Currently in Pain?  No/denies                  Outpatient [facility]  Lymphedema Life Impact Scale Total Score  27.94 %           [facility] Adult PT Treatment/Exercise - 09/29/19 0001      Manual Therapy   Manual Therapy  Passive ROM;Soft tissue mobilization    Edema Management  gave pt chip pack to use along with foam under the breast to increase fibrosis decreases    Soft tissue mobilization  briefly to the left latissimus with overhead flexion due to reports of pinching here during  the day recently.     Manual Lymphatic Drainage (MLD)  In supine: short neck,  Bil shoulders, Bil axillary and L inguinal nodes, established anterior inter-axillary anastomosis and L axillo-inguinal anastomosis, superior breast toward the anterior inter-axillary anastomosis then re-worked anastomosis, inferior breast toward the L axillo-inguinal anastomosis then re-worked anastomosis, extra time spent at the superior and inferior breast then  reworked all surfaces then deep abodminals. Then in sidelying lateral breast work towards inguinals.     Passive ROM  of the left shoulder briefly into flexion and abduction                  PT Long Term Goals - 09/20/19 1048       PT LONG TERM GOAL #1   Title  Pt will be ind with self breast and upper arm MLD or obtain compression pump    Status  On-going      PT LONG TERM GOAL #2   Title  Pt will obtain appropriate bra and sleeve for lymphedema management    Status  On-going      PT LONG TERM GOAL #3   Title  Pt will demonstrate low fall risk on the BERG balance scale or similar in the clinic    Status  On-going      PT LONG TERM GOAL #4   Title  Pt will decrease LLIS to 17% or less    Status  On-going            Plan - 09/29/19 1055    Clinical Impression Statement  Pt reports that therapy continues to be helpful but the breast is still tender and heavy.  Becoming more ind with self MLD and was measured for a sleeve and tribute bra/vest today.  Pt is also interested in doing some balance work    PT Frequency  2x / week    PT Duration  6 weeks    PT Treatment/Interventions  ADLs/Self Care Home Management;Patient/family education;Therapeutic exercise;Manual lymph drainage;Compression bandaging;Manual techniques;Passive range of motion;Taping;Neuromuscular re-education;Balance training    PT Next Visit Plan  try chip pack?  cont left breast MLD also working on left upper arm clearance, Lt UE PROM/AAROM, balance exercises for HEP    Consulted and Agree with Plan of Care  Patient       Patient will benefit from skilled therapeutic intervention in order to improve the following deficits and impairments:     Visit Diagnosis: Malignant neoplasm of upper-outer quadrant of left breast in female, estrogen receptor positive (HCC)  Abnormal posture  Stiffness of left shoulder, not elsewhere classified  Stiffness of right shoulder, not elsewhere classified  Lymphedema, not elsewhere classified     Problem List Patient Active Problem List   Diagnosis Date Noted  . Chemotherapy-induced peripheral neuropathy ([place]) 03/30/2019  . Port-A-Cath in place 12/15/2018  . Genetic testing 09/21/2018  .  Family history of breast cancer   . Malignant neoplasm of upper-outer quadrant of left breast in female, estrogen receptor positive ([place]) 08/29/2018  . Hyperlipidemia 08/04/2018  . Obesity (BMI 30-39.9) 05/09/2013  . Preventative health care 11/19/2010  . URI (upper respiratory infection) 11/19/2010  . KNEE PAIN, BILATERAL 09/25/2010  . SINUSITIS - ACUTE-NOS 12/24/2009  . MORBID OBESITY 06/14/2009  . COLD SORE 10/16/2008  . SKIN TAG 06/18/2008  . Hypothyroidism 03/17/2007  . Essential hypertension 03/17/2007    Stark Bray 09/29/2019, 10:59 AM  [place], [place], [address]  Phone: [phone]   Fax:  [phone]  Name: Kayla Banks MRN: [id number] Date of Birth: March 27, 1956

## 2019-10-04 ENCOUNTER — Other Ambulatory Visit: Payer: Self-pay

## 2019-10-04 ENCOUNTER — Encounter: Payer: Self-pay | Admitting: Rehabilitation

## 2019-10-04 ENCOUNTER — Ambulatory Visit: Payer: BC Managed Care – PPO | Admitting: Rehabilitation

## 2019-10-04 DIAGNOSIS — R293 Abnormal posture: Secondary | ICD-10-CM

## 2019-10-04 DIAGNOSIS — M25612 Stiffness of left shoulder, not elsewhere classified: Secondary | ICD-10-CM

## 2019-10-04 DIAGNOSIS — I890 Lymphedema, not elsewhere classified: Secondary | ICD-10-CM

## 2019-10-04 DIAGNOSIS — C50412 Malignant neoplasm of upper-outer quadrant of left female breast: Secondary | ICD-10-CM

## 2019-10-04 DIAGNOSIS — M25611 Stiffness of right shoulder, not elsewhere classified: Secondary | ICD-10-CM

## 2019-10-04 NOTE — Therapy (Signed)
[place], [place], [address] Phone: [phone]   Fax:  [phone]  Physical Therapy Treatment  Patient Details  Name: Kayla Banks MRN: [id number] Date of Birth: 05/22/1956 Referring Provider (PT): Dr. Erroll Luna   Encounter Date: 10/04/2019  PT End of Session - 10/04/19 1013    Visit Number  6    Number of Visits  13    Date for PT Re-Evaluation  09/14/19    Authorization Type  BCBS    PT Start Time  0905    PT Stop Time  0958    PT Time Calculation (min)  53 min    Activity Tolerance  Patient tolerated treatment well    Behavior During Therapy  [facility] for tasks assessed/performed       Past Medical History:  Diagnosis Date  . Complication of anesthesia   . Family history of breast cancer   . Heart murmur    no problems, saw cardiologist 2010  . Hypertension    on meds  . Hypothyroidism   . Osteoarthritis   . Plantar fasciitis   . PONV (postoperative nausea and vomiting)   . Thyroid disease    Hypothyroidism    Past Surgical History:  Procedure Laterality Date  . ABDOMINAL HYSTERECTOMY  02/2012  . AXILLARY LYMPH NODE DISSECTION Left 11/09/2018   Procedure: LEFT AXILLARY LYMPH NODE DISSECTION;  Surgeon: Erroll Luna, MD;  Location: [place];  Service: General;  Laterality: Left;  . BREAST LUMPECTOMY WITH RADIOACTIVE SEED AND SENTINEL LYMPH NODE BIOPSY Left 10/11/2018   Procedure: LEFT BREAST LUMPECTOMY WITH RADIOACTIVE SEED AND LEFT SENTINEL LYMPH NODE MAPPING WITH LEFT TARGETED LYMPH NODE BIOPSY;  Surgeon: Erroll Luna, MD;  Location: [place];  Service: General;  Laterality: Left;  . FOOT SURGERY Left    bone spurs  . LUMBAR LAMINECTOMY  1990  . PORT-A-CATH REMOVAL N/A 05/23/2019   Procedure: PORT REMOVAL;  Surgeon: Erroll Luna, MD;  Location: [place];  Service: General;  Laterality: N/A;  . PORTACATH PLACEMENT Right  12/01/2018   Procedure: INSERTION PORT-A-CATH WITH ULTRASOUND;  Surgeon: Erroll Luna, MD;  Location: [place];  Service: General;  Laterality: Right;    There were no vitals filed for this visit.  Subjective Assessment - 10/04/19 0919    Subjective  it has been feeling a little more full lately.  More on the side and maybe more tender underneath.  Still having the sharp pains when I move around without a bra on.    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 Chemo completed with Adriamycin and taxol and radiation also completed.  Hypothyroidism, HTN, obesity, OA bil knees    Patient Stated Goals  decrease breast swelling is the arm swollen    Currently in Pain?  No/denies                  Outpatient Rehab from [facility]  Lymphedema Life Impact Scale Total Score  27.94 %           [facility] Adult PT Treatment/Exercise - 10/04/19 0001      Exercises   Exercises  Shoulder      Shoulder Exercises: Supine   Flexion  AAROM;10 reps;Both    Flexion Limitations  prior to MLD      Manual Therapy  Edema Management  pt with improved skin mobility inferior breast but increased fullness lateral breast so pt will either move the foam back ot the outside or use both.  Discussed benefits for vest which pt will have to pay due to deductible not being yet met.  Also gave pt information on sigvaris chip pack insert and shefit bra for alternate use.  Also discussed benefits of compression vest for night and the axillary region    Manual Lymphatic Drainage (MLD)  In supine: short neck,  Bil shoulders, Bil axillary and L inguinal nodes, established anterior inter-axillary anastomosis and L axillo-inguinal anastomosis, superior breast toward the anterior inter-axillary anastomosis then re-worked anastomosis, inferior breast toward the L  axillo-inguinal anastomosis then re-worked anastomosis, extra time spent at the superior and inferior breast then  reworked all surfaces then deep abodminals. Then in sidelying lateral breast work towards inguinals.                   PT Long Term Goals - 09/20/19 1048      PT LONG TERM GOAL #1   Title  Pt will be ind with self breast and upper arm MLD or obtain compression pump    Status  On-going      PT LONG TERM GOAL #2   Title  Pt will obtain appropriate bra and sleeve for lymphedema management    Status  On-going      PT LONG TERM GOAL #3   Title  Pt will demonstrate low fall risk on the BERG balance scale or similar in the clinic    Status  On-going      PT LONG TERM GOAL #4   Title  Pt will decrease LLIS to 17% or less    Status  On-going            Plan - 10/04/19 1013    Clinical Impression Statement  Pt reports some increased lateral fullness and continued lateral pains with unsupported breast.  Discussed how this can be normal a few months post radiation and pt still continues to feel much better after MLD and PT which is a positive sign.  Pt with more lateral fullness today possible due to wearing her foam and chip pack only under the breast.  Also discussed garments during session.    PT Frequency  2x / week    PT Duration  6 weeks    PT Treatment/Interventions  ADLs/Self Care Home Management;Patient/family education;Therapeutic exercise;Manual lymph drainage;Compression bandaging;Manual techniques;Passive range of motion;Taping;Neuromuscular re-education;Balance training    PT Next Visit Plan  try chip pack laterally?  cont left breast MLD also working on left upper arm clearance, Lt UE PROM/AAROM, balance exercises for HEP    PT Home Exercise Plan  Post op shoulder ROM HEP, self breast and arm MLD    Consulted and Agree with Plan of Care  Patient       Patient will benefit from skilled therapeutic intervention in order to improve the following  deficits and impairments:     Visit Diagnosis: Malignant neoplasm of upper-outer quadrant of left breast in female, estrogen receptor positive (HCC)  Abnormal posture  Stiffness of left shoulder, not elsewhere classified  Stiffness of right shoulder, not elsewhere classified  Lymphedema, not elsewhere classified     Problem List Patient Active Problem List   Diagnosis Date Noted  . Chemotherapy-induced peripheral neuropathy ([place]) 03/30/2019  . Port-A-Cath in place 12/15/2018  . Genetic testing 09/21/2018  . Family history of breast  cancer   . Malignant neoplasm of upper-outer quadrant of left breast in female, estrogen receptor positive ([place]) 08/29/2018  . Hyperlipidemia 08/04/2018  . Obesity (BMI 30-39.9) 05/09/2013  . Preventative health care 11/19/2010  . URI (upper respiratory infection) 11/19/2010  . KNEE PAIN, BILATERAL 09/25/2010  . SINUSITIS - ACUTE-NOS 12/24/2009  . MORBID OBESITY 06/14/2009  . COLD SORE 10/16/2008  . SKIN TAG 06/18/2008  . Hypothyroidism 03/17/2007  . Essential hypertension 03/17/2007    Stark Bray 10/04/2019, 10:16 AM  [place], [place], [address] Phone: [phone]   Fax:  [phone]  Name: Kayla Banks MRN: [id number] Date of Birth: [birth year]

## 2019-10-04 NOTE — Progress Notes (Signed)
  Radiation Oncology         [phone] ________________________________  Name: Kayla Banks MRN: [id number]  Date: 07/21/2019  DOB: 05-30-1956  End of Treatment Note  Diagnosis:   left-sided breast cancer     Indication for treatment:  Curative       Radiation treatment dates:   06/01/19 - 07/21/19  Site/dose:   The patient initially received a dose of 50.4 Gy in 28 fractions to the breast and SCLV region. This was delivered using a 3-D conformal technique. The patient then received a boost to the seroma. This delivered an additional 10 Gy in 5 fractions using a 3-field photon boost. After a replan, a 4-field boost was ultimately used. The total dose was 60.4 Gy.  Narrative: The patient tolerated radiation treatment relatively well.   The patient had some expected skin irritation as she progressed during treatment. Moist desquamation was present at the end of treatment.  Plan: The patient has completed radiation treatment. The patient will return to radiation oncology clinic for routine followup in one month. I advised the patient to call or return sooner if they have any questions or concerns related to their recovery or treatment. ________________________________  Jodelle Gross, M.D., Ph.D.

## 2019-10-06 ENCOUNTER — Encounter: Payer: BC Managed Care – PPO | Admitting: Rehabilitation

## 2019-10-11 ENCOUNTER — Ambulatory Visit: Payer: BC Managed Care – PPO

## 2019-10-11 ENCOUNTER — Other Ambulatory Visit: Payer: Self-pay

## 2019-10-11 DIAGNOSIS — C50412 Malignant neoplasm of upper-outer quadrant of left female breast: Secondary | ICD-10-CM | POA: Diagnosis not present

## 2019-10-11 DIAGNOSIS — Z170 Estrogen receptor positive status [ER+]: Secondary | ICD-10-CM

## 2019-10-11 DIAGNOSIS — M25611 Stiffness of right shoulder, not elsewhere classified: Secondary | ICD-10-CM

## 2019-10-11 DIAGNOSIS — M25612 Stiffness of left shoulder, not elsewhere classified: Secondary | ICD-10-CM

## 2019-10-11 DIAGNOSIS — I890 Lymphedema, not elsewhere classified: Secondary | ICD-10-CM

## 2019-10-11 DIAGNOSIS — R293 Abnormal posture: Secondary | ICD-10-CM

## 2019-10-11 NOTE — Patient Instructions (Signed)

## 2019-10-11 NOTE — Therapy (Signed)
[place], [place], [address] Phone: [phone]   Fax:  [phone]  Physical Therapy Treatment  Patient Details  Name: Kayla Banks MRN: [id number] Date of Birth: 1956/07/21 Referring Provider (PT): Dr. Erroll Luna   Encounter Date: 10/11/2019  PT End of Session - 10/11/19 0904    Visit Number  7    Number of Visits  13    Date for PT Re-Evaluation  09/14/19    Authorization Type  BCBS    PT Start Time  0902    PT Stop Time  0956    PT Time Calculation (min)  54 min    Activity Tolerance  Patient tolerated treatment well    Behavior During Therapy  [facility] for tasks assessed/performed       Past Medical History:  Diagnosis Date  . Complication of anesthesia   . Family history of breast cancer   . Heart murmur    no problems, saw cardiologist 2010  . Hypertension    on meds  . Hypothyroidism   . Osteoarthritis   . Plantar fasciitis   . PONV (postoperative nausea and vomiting)   . Thyroid disease    Hypothyroidism    Past Surgical History:  Procedure Laterality Date  . ABDOMINAL HYSTERECTOMY  02/2012  . AXILLARY LYMPH NODE DISSECTION Left 11/09/2018   Procedure: LEFT AXILLARY LYMPH NODE DISSECTION;  Surgeon: Erroll Luna, MD;  Location: [place];  Service: General;  Laterality: Left;  . BREAST LUMPECTOMY WITH RADIOACTIVE SEED AND SENTINEL LYMPH NODE BIOPSY Left 10/11/2018   Procedure: LEFT BREAST LUMPECTOMY WITH RADIOACTIVE SEED AND LEFT SENTINEL LYMPH NODE MAPPING WITH LEFT TARGETED LYMPH NODE BIOPSY;  Surgeon: Erroll Luna, MD;  Location: [place];  Service: General;  Laterality: Left;  . FOOT SURGERY Left    bone spurs  . LUMBAR LAMINECTOMY  1990  . PORT-A-CATH REMOVAL N/A 05/23/2019   Procedure: PORT REMOVAL;  Surgeon: Erroll Luna, MD;  Location: [place];  Service: General;  Laterality: N/A;  . PORTACATH PLACEMENT Right  12/01/2018   Procedure: INSERTION PORT-A-CATH WITH ULTRASOUND;  Surgeon: Erroll Luna, MD;  Location: [place];  Service: General;  Laterality: Right;    There were no vitals filed for this visit.  Subjective Assessment - 10/11/19 0904    Subjective  Pt reports that she has been using her foam that she was provided with but she cannot tell if it is making a different. She reports that she continues to feel full and heavy.    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 Chemo completed with Adriamycin and taxol and radiation also completed.  Hypothyroidism, HTN, obesity, OA bil knees    Patient Stated Goals  decrease breast swelling is the arm swollen    Currently in Pain?  No/denies    Pain Score  0-No pain                  Outpatient Re[facility] from [facility]  Lymphedema Life Impact Scale Total Score  27.94 %           [facility] Adult PT Treatment/Exercise - 10/11/19 0001      Shoulder Exercises: Supine   Horizontal ABduction  Strengthening;Both;10 reps    Theraband Level (Shoulder Horizontal ABduction)  Level 1 (Yellow)    Horizontal ABduction Limitations  tactile cueing for correct palm direction and demonstration for correct movement.     External Rotation  Strengthening;Both;10 reps    Theraband Level (Shoulder External Rotation)  Level 1 (Yellow)    External Rotation Limitations  VC to keep elbow tucked, demonstration and tactile cueing for correct palm direction to prevent impingement.     Flexion  Strengthening;Both;10 reps    Flexion Limitations  tension throughout overhead to hips, tactile cueing for correct palm placement to prevent impingement and demonstration    Diagonals  Left;10 reps;Strengthening    Theraband Level (Shoulder Diagonals)  Level 1 (Yellow)    Diagonals Limitations  VC and demonstration for correct  movement as well as for palm direction.       Manual Therapy   Manual Therapy  Manual Lymphatic Drainage (MLD)    Manual Lymphatic Drainage (MLD)  In supine: short neck,  Bil shoulders, Bil axillary and L inguinal nodes, 5x diaphragmatic breathes, established anterior inter-axillary anastomosis and L axillo-inguinal anastomosis, superior breast toward the anterior inter-axillary anastomosis then re-worked anastomosis, inferior breast toward the L axillo-inguinal anastomosis then re-worked anastomosis, extra time spent at the superior and inferior breast, Medial to lateral L brachium, lateral L brachium, all surfaces of L antebrachium, dorsum of the hand then reworked all surfaces. Then in sidelying lateral breast work toward [employer] anastomosis extratime spent here back into supine: re-worked all surfaces; deep abdominals.              PT Education - 10/11/19 0945    Education Details  Pt was educated on scapular series. Discussed possibly buying a supportive sports bra with wide band for compression to help life her L breast to see if this doesn't help fluid drain. Also discussed possibility of a pump for the chest area.    Person(s) Educated  Patient    Methods  Explanation;Demonstration;Verbal cues;Handout;Tactile cues    Comprehension  Verbalized understanding;Returned demonstration          PT Long Term Goals - 09/20/19 1048      PT LONG TERM GOAL #1   Title  Pt will be ind with self breast and upper arm MLD or obtain compression pump    Status  On-going      PT LONG TERM GOAL #2   Title  Pt will obtain appropriate bra and sleeve for lymphedema management    Status  On-going      PT LONG TERM GOAL #3   Title  Pt will demonstrate low fall risk on the BERG balance scale or similar in the clinic    Status  On-going      PT LONG TERM GOAL #4   Title  Pt will decrease LLIS to 17% or less    Status  On-going            Plan - 10/11/19 0903     Clinical Impression Statement  Pt demonstrates decreased fullness in her L breast then from the last time this physical therapist saw her but this is an earlier appointment time. She continues with most fullness noted in the inferior portion of the L breast that improves almost immediately when lifting the breast. Discussed possibility of purchasing a sports bra with lift to see if this doesn't help drain fluid due to pendulus breast. MLD was performed with good results over the entire L breast with extra time spent here and into side-lying into the posterior inter-axillary anastomosis and into the inguinal nodes as well as for  the LUE. Scapular series was initiate this session using the least resistance band with good results. Pt has some difficulty requiring VC/tactile cueing for correct form and to prevent compensation but was able to perform correctly without compensation after instruction. Pt will benefit from continued POC at this time.    Personal Factors and Comorbidities  Age;Fitness;Comorbidity 2    Comorbidities  radiation, ALND    Examination-Activity Limitations  Lift    Examination-Participation Restrictions  Yard Work;Community Activity    Rehab Potential  Excellent    Clinical Impairments Affecting Rehab Potential  ALND, radiation, size of breast    PT Frequency  2x / week    PT Duration  6 weeks    PT Treatment/Interventions  ADLs/Self Care Home Management;Patient/family education;Therapeutic exercise;Manual lymph drainage;Compression bandaging;Manual techniques;Passive range of motion;Taping;Neuromuscular re-education;Balance training    PT Next Visit Plan  how are chip packs? Sports bra thoughts? assess scapular series, cont left breast MLD also working on left upper arm clearance, Lt UE PROM/AAROM, balance exercises for HEP    PT Home Exercise Plan  Post op shoulder ROM HEP, self breast and arm MLD    Recommended Other Services  compression bras/sleeve/vest possibly a vasopneumatic  pump    Consulted and Agree with Plan of Care  Patient       Patient will benefit from skilled therapeutic intervention in order to improve the following deficits and impairments:  Impaired UE functional use, Pain, Postural dysfunction, Decreased knowledge of precautions, Decreased range of motion, Increased edema, Decreased skin integrity  Visit Diagnosis: Malignant neoplasm of upper-outer quadrant of left breast in female, estrogen receptor positive (HCC)  Abnormal posture  Stiffness of left shoulder, not elsewhere classified  Stiffness of right shoulder, not elsewhere classified  Lymphedema, not elsewhere classified     Problem List Patient Active Problem List   Diagnosis Date Noted  . Chemotherapy-induced peripheral neuropathy ([place]) 03/30/2019  . Port-A-Cath in place 12/15/2018  . Genetic testing 09/21/2018  . Family history of breast cancer   . Malignant neoplasm of upper-outer quadrant of left breast in female, estrogen receptor positive ([place]) 08/29/2018  . Hyperlipidemia 08/04/2018  . Obesity (BMI 30-39.9) 05/09/2013  . Preventative health care 11/19/2010  . URI (upper respiratory infection) 11/19/2010  . KNEE PAIN, BILATERAL 09/25/2010  . SINUSITIS - ACUTE-NOS 12/24/2009  . MORBID OBESITY 06/14/2009  . COLD SORE 10/16/2008  . SKIN TAG 06/18/2008  . Hypothyroidism 03/17/2007  . Essential hypertension 03/17/2007    Ander Purpura, PT 10/11/2019, 9:58 AM  [place], [place], [address] Phone: [phone]   Fax:  [phone]  Name: Shernita Rabinovich MRN: [id number] Date of Birth: [birth year]

## 2019-10-13 ENCOUNTER — Ambulatory Visit: Payer: BC Managed Care – PPO | Admitting: Physical Therapy

## 2019-10-13 ENCOUNTER — Other Ambulatory Visit: Payer: Self-pay

## 2019-10-13 ENCOUNTER — Encounter: Payer: Self-pay | Admitting: Physical Therapy

## 2019-10-13 DIAGNOSIS — M25612 Stiffness of left shoulder, not elsewhere classified: Secondary | ICD-10-CM

## 2019-10-13 DIAGNOSIS — C50412 Malignant neoplasm of upper-outer quadrant of left female breast: Secondary | ICD-10-CM

## 2019-10-13 DIAGNOSIS — I890 Lymphedema, not elsewhere classified: Secondary | ICD-10-CM

## 2019-10-13 DIAGNOSIS — R293 Abnormal posture: Secondary | ICD-10-CM

## 2019-10-13 DIAGNOSIS — M25611 Stiffness of right shoulder, not elsewhere classified: Secondary | ICD-10-CM

## 2019-10-13 NOTE — Therapy (Signed)
[place], [place], [address] Phone: [phone]   Fax:  [phone]  Physical Therapy Treatment  Patient Details  Name: Kayla Banks MRN: [id number] Date of Birth: July 24, 1956 Referring Provider (PT): Dr. Erroll Luna   Encounter Date: 10/13/2019  PT End of Session - 10/13/19 1231    Visit Number  8    Number of Visits  25    Date for PT Re-Evaluation  11/24/19    PT Start Time  0910   pt arrived late   PT Stop Time  0955    PT Time Calculation (min)  45 min    Activity Tolerance  Patient tolerated treatment well    Behavior During Therapy  [facility] for tasks assessed/performed       Past Medical History:  Diagnosis Date  . Complication of anesthesia   . Family history of breast cancer   . Heart murmur    no problems, saw cardiologist 2010  . Hypertension    on meds  . Hypothyroidism   . Osteoarthritis   . Plantar fasciitis   . PONV (postoperative nausea and vomiting)   . Thyroid disease    Hypothyroidism    Past Surgical History:  Procedure Laterality Date  . ABDOMINAL HYSTERECTOMY  02/2012  . AXILLARY LYMPH NODE DISSECTION Left 11/09/2018   Procedure: LEFT AXILLARY LYMPH NODE DISSECTION;  Surgeon: Erroll Luna, MD;  Location: [place];  Service: General;  Laterality: Left;  . BREAST LUMPECTOMY WITH RADIOACTIVE SEED AND SENTINEL LYMPH NODE BIOPSY Left 10/11/2018   Procedure: LEFT BREAST LUMPECTOMY WITH RADIOACTIVE SEED AND LEFT SENTINEL LYMPH NODE MAPPING WITH LEFT TARGETED LYMPH NODE BIOPSY;  Surgeon: Erroll Luna, MD;  Location: [place];  Service: General;  Laterality: Left;  . FOOT SURGERY Left    bone spurs  . LUMBAR LAMINECTOMY  1990  . PORT-A-CATH REMOVAL N/A 05/23/2019   Procedure: PORT REMOVAL;  Surgeon: Erroll Luna, MD;  Location: [place];  Service: General;  Laterality: N/A;  . PORTACATH PLACEMENT Right 12/01/2018    Procedure: INSERTION PORT-A-CATH WITH ULTRASOUND;  Surgeon: Erroll Luna, MD;  Location: [place];  Service: General;  Laterality: Right;    There were no vitals filed for this visit.  Subjective Assessment - 10/13/19 1024    Subjective  Pt states that she does not have the money available right now to pay for the compression garments.  She has not yet met her out of pocket and it will take some time to acqurie the money.    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 Chemo completed with Adriamycin and taxol and radiation also completed.  Hypothyroidism, HTN, obesity, OA bil knees    Currently in Pain?  No/denies         [facility] PT Assessment - 10/13/19 0001      Assessment   Medical Diagnosis  Left breast cancer    Referring Provider (PT)  Dr. [name] Cornett    Onset Date/Surgical Date  10/11/18      Prior Function   Level of Independence  Independent              Outpatient Rehab from 08/03/2019 in [place]  Lymphedema Life Impact Scale Total Score  27.94 %           [facility] Adult PT Treatment/Exercise - 10/13/19  0001      Manual Therapy   Manual Lymphatic Drainage (MLD)  In supine: short neck,  Bil shoulders, Bil axillary and L inguinal nodes, established anterior inter-axillary anastomosis and L axillo-inguinal anastomosis, superior breast toward the anterior inter-axillary anastomosis then re-worked anastomosis, inferior breast toward the L axillo-inguinal anastomosis then re-worked anastomosis, extra time spent at the superior and inferior breast then  reworked all surfaces then deep abodminals. Then in sidelying lateral breast work towards inguinals.                   PT Long Term Goals - 10/13/19 1231      PT LONG TERM GOAL #1   Title  Pt will be ind with self breast and upper arm MLD or obtain compression pump     Time  6    Period  Weeks    Status  On-going      PT LONG TERM GOAL #2   Title  Pt will obtain appropriate bra and sleeve for lymphedema management    Time  6    Period  Weeks    Status  On-going      PT LONG TERM GOAL #3   Title  Pt will demonstrate low fall risk on the BERG balance scale or similar in the clinic    Time  6    Period  Weeks    Status  On-going      PT LONG TERM GOAL #4   Title  Pt will decrease LLIS to 17% or less    Baseline  27% on 08/03/19    Time  6    Period  Weeks    Status  On-going            Plan - 10/13/19 1233    Clinical Impression Statement  Pt continues to have marked fullness with pitting edema  in her left breast.  She is beginning to do some at home and responds with softer tissue during out session here.  She will benefit from a compression pump as she is having difficulty managing at home ( demographics sent today ) .  She has a financial barrier in getting her compression bras right now until her out of pocket in met.  She needs continued skiled PT to address her symptoms. Renewal for 6 more weeks sent today    Personal Factors and Comorbidities  Age;Fitness;Comorbidity 2    Comorbidities  radiation, ALND    Examination-Participation Restrictions  Yard Work;Community Activity    Stability/Clinical Decision Making  Evolving/Moderate complexity    Rehab Potential  Excellent    Clinical Impairments Affecting Rehab Potential  ALND, radiation, size of breast    PT Frequency  2x / week    PT Duration  6 weeks    PT Treatment/Interventions  ADLs/Self Care Home Management;Patient/family education;Therapeutic exercise;Manual lymph drainage;Compression bandaging;Manual techniques;Passive range of motion;Taping;Neuromuscular re-education;Balance training    PT Next Visit Plan  assess scapular series, cont left breast MLD also working on left upper arm clearance,( consider coban chest wrap until she can get compression vest)  Lt UE PROM/AAROM,  balance exercises for HEP    PT Home Exercise Plan  Post op shoulder ROM HEP, self breast and arm MLD    Recommended Other Services  demographics sent to [place] and Agree with Plan of Care  Patient       Patient will benefit from skilled therapeutic intervention in order to improve the following  deficits and impairments:  Impaired UE functional use, Pain, Postural dysfunction, Decreased knowledge of precautions, Decreased range of motion, Increased edema, Decreased skin integrity  Visit Diagnosis: Malignant neoplasm of upper-outer quadrant of left breast in female, estrogen receptor positive ([place]) - Plan: PT plan of care cert/re-cert  Abnormal posture - Plan: PT plan of care cert/re-cert  Stiffness of right shoulder, not elsewhere classified - Plan: PT plan of care cert/re-cert  Lymphedema, not elsewhere classified - Plan: PT plan of care cert/re-cert  Stiffness of left shoulder, not elsewhere classified - Plan: PT plan of care cert/re-cert     Problem List Patient Active Problem List   Diagnosis Date Noted  . Chemotherapy-induced peripheral neuropathy ([place]) 03/30/2019  . Port-A-Cath in place 12/15/2018  . Genetic testing 09/21/2018  . Family history of breast cancer   . Malignant neoplasm of upper-outer quadrant of left breast in female, estrogen receptor positive ([place]) 08/29/2018  . Hyperlipidemia 08/04/2018  . Obesity (BMI 30-39.9) 05/09/2013  . Preventative health care 11/19/2010  . URI (upper respiratory infection) 11/19/2010  . KNEE PAIN, BILATERAL 09/25/2010  . SINUSITIS - ACUTE-NOS 12/24/2009  . MORBID OBESITY 06/14/2009  . COLD SORE 10/16/2008  . SKIN TAG 06/18/2008  . Hypothyroidism 03/17/2007  . Essential hypertension 03/17/2007   Donato Heinz. Owens Shark PT  Norwood Levo 10/13/2019, 12:39 PM  [place], [place], [address] Phone: [phone]   Fax:   [phone]  Name: Kayla Banks MRN: [id number] Date of Birth: 08-Jan-1956

## 2019-10-16 ENCOUNTER — Telehealth: Payer: Self-pay | Admitting: Licensed Clinical Social Worker

## 2019-10-16 NOTE — Telephone Encounter (Signed)
[place] Clinical Social Work  Received request from [employer] inquiring about financial resources to help purchase a compression bra for patient. LVM for patient requesting a return call to discuss options.  Potential resources if patient qualifies are:  [employer]- [email or web address] [employer]- [email or web address]   Desirre Eickhoff E Trynity Skousen , LCSW

## 2019-10-18 ENCOUNTER — Other Ambulatory Visit: Payer: Self-pay

## 2019-10-18 ENCOUNTER — Ambulatory Visit: Payer: BC Managed Care – PPO | Attending: Hematology and Oncology | Admitting: Rehabilitation

## 2019-10-18 ENCOUNTER — Encounter: Payer: Self-pay | Admitting: Rehabilitation

## 2019-10-18 DIAGNOSIS — Z170 Estrogen receptor positive status [ER+]: Secondary | ICD-10-CM | POA: Diagnosis present

## 2019-10-18 DIAGNOSIS — C50412 Malignant neoplasm of upper-outer quadrant of left female breast: Secondary | ICD-10-CM

## 2019-10-18 DIAGNOSIS — M25611 Stiffness of right shoulder, not elsewhere classified: Secondary | ICD-10-CM | POA: Diagnosis present

## 2019-10-18 DIAGNOSIS — M25612 Stiffness of left shoulder, not elsewhere classified: Secondary | ICD-10-CM

## 2019-10-18 DIAGNOSIS — R293 Abnormal posture: Secondary | ICD-10-CM | POA: Diagnosis present

## 2019-10-18 DIAGNOSIS — I890 Lymphedema, not elsewhere classified: Secondary | ICD-10-CM

## 2019-10-18 NOTE — Therapy (Signed)
[place], [place], [address] Phone: [phone]   Fax:  [phone]  Physical Therapy Treatment  Patient Details  Name: Kayla Banks MRN: [id number] Date of Birth: 14-Nov-1955 Referring Provider (PT): Dr. Erroll Luna   Encounter Date: 10/18/2019  PT End of Session - 10/18/19 1048    Visit Number  9    Number of Visits  25    Date for PT Re-Evaluation  11/24/19    PT Start Time  0903    PT Stop Time  0953    PT Time Calculation (min)  50 min    Activity Tolerance  Patient tolerated treatment well    Behavior During Therapy  [facility] for tasks assessed/performed       Past Medical History:  Diagnosis Date  . Complication of anesthesia   . Family history of breast cancer   . Heart murmur    no problems, saw cardiologist 2010  . Hypertension    on meds  . Hypothyroidism   . Osteoarthritis   . Plantar fasciitis   . PONV (postoperative nausea and vomiting)   . Thyroid disease    Hypothyroidism    Past Surgical History:  Procedure Laterality Date  . ABDOMINAL HYSTERECTOMY  02/2012  . AXILLARY LYMPH NODE DISSECTION Left 11/09/2018   Procedure: LEFT AXILLARY LYMPH NODE DISSECTION;  Surgeon: Erroll Luna, MD;  Location: [place];  Service: General;  Laterality: Left;  . BREAST LUMPECTOMY WITH RADIOACTIVE SEED AND SENTINEL LYMPH NODE BIOPSY Left 10/11/2018   Procedure: LEFT BREAST LUMPECTOMY WITH RADIOACTIVE SEED AND LEFT SENTINEL LYMPH NODE MAPPING WITH LEFT TARGETED LYMPH NODE BIOPSY;  Surgeon: Erroll Luna, MD;  Location: [place];  Service: General;  Laterality: Left;  . FOOT SURGERY Left    bone spurs  . LUMBAR LAMINECTOMY  1990  . PORT-A-CATH REMOVAL N/A 05/23/2019   Procedure: PORT REMOVAL;  Surgeon: Erroll Luna, MD;  Location: [place];  Service: General;  Laterality: N/A;  . PORTACATH PLACEMENT Right 12/01/2018   Procedure:  INSERTION PORT-A-CATH WITH ULTRASOUND;  Surgeon: Erroll Luna, MD;  Location: [place];  Service: General;  Laterality: Right;    There were no vitals filed for this visit.  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 Chemo completed with Adriamycin and taxol and radiation also completed.  Hypothyroidism, HTN, obesity, OA bil knees    Currently in Pain?  No/denies                  [facility]  Lymphedema Life Impact Scale Total Score  27.94 %           [facility] Adult PT Treatment/Exercise - 10/18/19 0001      Manual Therapy   Edema Management  pt able to fit in clinic demo size SQ ABC compression bra which she was given for use.  Educated to use the compression bra as much as possible with her foam inserts and to perform self MLD after wearing pads for awhile.  Pt had a phone call about pump but needs to call them back    Manual Lymphatic Drainage (MLD)  In supine: short neck,  Bil shoulders, Bil axillary and L inguinal nodes,  established anterior inter-axillary anastomosis and L axillo-inguinal anastomosis, superior breast toward the anterior inter-axillary anastomosis then re-worked anastomosis, inferior breast toward the L axillo-inguinal anastomosis then re-worked anastomosis, extra time spent at the superior and inferior breast then  reworked all surfaces then deep abodminals. Then in sidelying lateral breast work towards inguinals. and posterior interaxillary                  PT Long Term Goals - 10/13/19 1231      PT LONG TERM GOAL #1   Title  Pt will be ind with self breast and upper arm MLD or obtain compression pump    Time  6    Period  Weeks    Status  On-going      PT LONG TERM GOAL #2    Title  Pt will obtain appropriate bra and sleeve for lymphedema management    Time  6    Period  Weeks    Status  On-going      PT LONG TERM GOAL #3   Title  Pt will demonstrate low fall risk on the BERG balance scale or similar in the clinic    Time  6    Period  Weeks    Status  On-going      PT LONG TERM GOAL #4   Title  Pt will decrease LLIS to 17% or less    Baseline  27% on 08/03/19    Time  6    Period  Weeks    Status  On-going            Plan - 10/18/19 1048    Clinical Impression Statement  Able to find pt a compression bra sample from the clinic that fits well but a little loose on the lateral side.  Pt was instructed how to fill the bra for more compression.  Continued left breast MLD today with less decrease in inferior breast tension with MLD today.    PT Frequency  2x / week    PT Duration  6 weeks    PT Treatment/Interventions  ADLs/Self Care Home Management;Patient/family education;Therapeutic exercise;Manual lymph drainage;Compression bandaging;Manual techniques;Passive range of motion;Taping;Neuromuscular re-education;Balance training    PT Next Visit Plan  how was bra? talk to pump people?, did [employer] her for a sleeve as well? cont left breast MLD also working on left upper arm clearance,  Lt UE PROM/AAROM, balance exercises for HEP    PT Home Exercise Plan  Post op shoulder ROM HEP, self breast and arm MLD    Recommended Other Services  has compression bra, measured for vest and sleeve? flexitouch demographics sent    Consulted and Agree with Plan of Care  Patient       Patient will benefit from skilled therapeutic intervention in order to improve the following deficits and impairments:     Visit Diagnosis: Malignant neoplasm of upper-outer quadrant of left breast in female, estrogen receptor positive (H[facility])  Abnormal posture  Stiffness of right shoulder, not elsewhere classified  Lymphedema, not elsewhere classified  Stiffness of left  shoulder, not elsewhere classified     Problem List Patient Active Problem List   Diagnosis Date Noted  . Chemotherapy-induced peripheral neuropathy ([place]) 03/30/2019  . Port-A-Cath in place 12/15/2018  . Genetic testing 09/21/2018  . Family history of breast cancer   . Malignant neoplasm of upper-outer quadrant of left breast in female, estrogen receptor positive ([place]) 08/29/2018  . Hyperlipidemia 08/04/2018  . Obesity (BMI  30-39.9) 05/09/2013  . Preventative health care 11/19/2010  . URI (upper respiratory infection) 11/19/2010  . KNEE PAIN, BILATERAL 09/25/2010  . SINUSITIS - ACUTE-NOS 12/24/2009  . MORBID OBESITY 06/14/2009  . COLD SORE 10/16/2008  . SKIN TAG 06/18/2008  . Hypothyroidism 03/17/2007  . Essential hypertension 03/17/2007    Stark Bray 10/18/2019, 10:51 AM  [place], [place], [address] Phone: [phone]   Fax:  [phone]  Name: Denzil Bristol MRN: [id number] Date of Birth: 10/30/1955

## 2019-10-20 ENCOUNTER — Other Ambulatory Visit: Payer: Self-pay

## 2019-10-20 ENCOUNTER — Ambulatory Visit: Payer: BC Managed Care – PPO | Admitting: Rehabilitation

## 2019-10-20 ENCOUNTER — Encounter: Payer: Self-pay | Admitting: Rehabilitation

## 2019-10-20 DIAGNOSIS — I890 Lymphedema, not elsewhere classified: Secondary | ICD-10-CM

## 2019-10-20 DIAGNOSIS — C50412 Malignant neoplasm of upper-outer quadrant of left female breast: Secondary | ICD-10-CM | POA: Diagnosis not present

## 2019-10-20 DIAGNOSIS — M25612 Stiffness of left shoulder, not elsewhere classified: Secondary | ICD-10-CM

## 2019-10-20 DIAGNOSIS — M25611 Stiffness of right shoulder, not elsewhere classified: Secondary | ICD-10-CM

## 2019-10-20 DIAGNOSIS — R293 Abnormal posture: Secondary | ICD-10-CM

## 2019-10-20 NOTE — Therapy (Signed)
[place], [place], [address] Phone: [phone]   Fax:  [phone]  Physical Therapy Treatment  Patient Details  Name: Kayla Banks MRN: [id number] Date of Birth: [birth year] Referring Provider (PT): Dr. Erroll Luna   Encounter Date: 10/20/2019  PT End of Session - 10/20/19 0907    Visit Number  10    Number of Visits  25    Date for PT Re-Evaluation  11/24/19    PT Start Time  0900    PT Stop Time  0959    PT Time Calculation (min)  59 min    Activity Tolerance  Patient tolerated treatment well    Behavior During Therapy  [facility] for tasks assessed/performed       Past Medical History:  Diagnosis Date  . Complication of anesthesia   . Family history of breast cancer   . Heart murmur    no problems, saw cardiologist 2010  . Hypertension    on meds  . Hypothyroidism   . Osteoarthritis   . Plantar fasciitis   . PONV (postoperative nausea and vomiting)   . Thyroid disease    Hypothyroidism    Past Surgical History:  Procedure Laterality Date  . ABDOMINAL HYSTERECTOMY  02/2012  . AXILLARY LYMPH NODE DISSECTION Left 11/09/2018   Procedure: LEFT AXILLARY LYMPH NODE DISSECTION;  Surgeon: Erroll Luna, MD;  Location: [place];  Service: General;  Laterality: Left;  . BREAST LUMPECTOMY WITH RADIOACTIVE SEED AND SENTINEL LYMPH NODE BIOPSY Left 10/11/2018   Procedure: LEFT BREAST LUMPECTOMY WITH RADIOACTIVE SEED AND LEFT SENTINEL LYMPH NODE MAPPING WITH LEFT TARGETED LYMPH NODE BIOPSY;  Surgeon: Erroll Luna, MD;  Location: [place];  Service: General;  Laterality: Left;  . FOOT SURGERY Left    bone spurs  . LUMBAR LAMINECTOMY  1990  . PORT-A-CATH REMOVAL N/A 05/23/2019   Procedure: PORT REMOVAL;  Surgeon: Erroll Luna, MD;  Location: [place];  Service: General;  Laterality: N/A;  . PORTACATH PLACEMENT Right 12/01/2018   Procedure:  INSERTION PORT-A-CATH WITH ULTRASOUND;  Surgeon: Erroll Luna, MD;  Location: [place];  Service: General;  Laterality: Right;    There were no vitals filed for this visit.  Subjective Assessment - 10/20/19 0903    Subjective  No pain today.  I haven't gotten a chance to wash the bra so I haven't been wearing it.  I did my massage yesterday. 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 Chemo completed with Adriamycin and taxol and radiation also completed.  Hypothyroidism, HTN, obesity, OA bil knees    Currently in Pain?  No/denies         [facility] PT Assessment - 10/20/19 0001      AROM   Left Shoulder Extension  50 Degrees    Left Shoulder Flexion  132 Degrees   pull   Left Shoulder ABduction  125 Degrees   pull   Left Shoulder External Rotation  80 Degrees        LYMPHEDEMA/ONCOLOGY QUESTIONNAIRE - 10/20/19 0928      Left Upper Extremity Lymphedema   15 cm Proximal to Olecranon Process  38.6 cm    10 cm Proximal to Olecranon Process  37.5 cm    Olecranon Process  31 cm    15 cm Proximal  to Ulnar Styloid Process  28.3 cm    10 cm Proximal to Ulnar Styloid Process  25 cm    Just Proximal to Ulnar Styloid Process  18.9 cm    Across Hand at [employer]  20.6 cm    At [place] of 2nd Digit  5.9 cm           Outpatient [facility]  Lymphedema Life Impact Scale Total Score  27.94 %           [facility] Adult PT Treatment/Exercise - 10/20/19 0001      Shoulder Exercises: Supine   Horizontal ABduction  Strengthening;Both;10 reps    Theraband Level (Shoulder Horizontal ABduction)  Level 1 (Yellow)    External Rotation  Strengthening;Both;10 reps    Theraband Level (Shoulder External Rotation)  Level 1 (Yellow)    Flexion  Strengthening;Both;10 reps    Flexion Limitations   tension throughout overhead to hips, tactile cueing for correct palm placement to prevent impingement and demonstration    Diagonals  Left;10 reps;Strengthening    Theraband Level (Shoulder Diagonals)  Level 1 (Yellow)      Shoulder Exercises: Seated   Other Seated Exercises  XYW x 6 bil and mermaid side stretch for the left x 6      Shoulder Exercises: Pulleys   Flexion  2 minutes    Flexion Limitations  for warm-up and axillary stretching    ABduction  2 minutes    ABduction Limitations  with instruction for intial performance for woram up and axillary stretching    Other Pulley Exercises  given information on how to order pulleys as pt reports she would like to have one      Shoulder Exercises: Therapy Ball   Flexion  Both;10 reps      Manual Therapy   Manual Lymphatic Drainage (MLD)  In supine: short neck,  Bil shoulders, Bil axillary and L inguinal nodes, established anterior inter-axillary anastomosis and L axillo-inguinal anastomosis, superior breast toward the anterior inter-axillary anastomosis then re-worked anastomosis, inferior breast toward the L axillo-inguinal anastomosis then re-worked anastomosis, extra time spent at the superior and inferior breast then  reworked all surfaces then deep abodminals. Then in sidelying lateral breast work towards inguinals. and posterior interaxillary                  PT Long Term Goals - 10/20/19 0907      PT LONG TERM GOAL #1   Title  Pt will be ind with self breast and upper arm MLD or obtain compression pump    Status  Achieved      PT LONG TERM GOAL #2   Title  Pt will obtain appropriate bra and sleeve for lymphedema management    Baseline  pt has been measured for both but has to hold funds.    Status  Partially Met      PT LONG TERM GOAL #3   Title  Pt will demonstrate low fall risk on the BERG balance scale or similar in the clinic    Status  Achieved      PT LONG TERM GOAL #4   Title  Pt will decrease LLIS to  17% or less    Status  On-going            Plan - 10/20/19 0910    Clinical Impression Statement  Pt did not yet try the compression bra so was reminded to get this  on as soon as possible.  Added some more Shoulder AROM/mobility/stretches for active muscle pump and axillary mobility to help with lymphatic flow and ROM.  measured ROM and arm size again today with improvements in shoulder AROM but still limited with pull into fleixon and abduction.  Circumferences are 58m larger than eval day so not significantly larger but pt was encouraged to get the sleeve even if having to wait for the tribute vest.  More skin mobility on the inferior breast today but sitll lateral congestion most likely from bra wear.    PT Frequency  2x / week    PT Duration  6 weeks    PT Treatment/Interventions  ADLs/Self Care Home Management;Patient/family education;Therapeutic exercise;Manual lymph drainage;Compression bandaging;Manual techniques;Passive range of motion;Taping;Neuromuscular re-education;Balance training    PT Next Visit Plan  how was bra? talk to pump people?, cont left breast MLD also working on left upper arm clearance,  Lt UE PROM/AAROM,    PT Home Exercise Plan  Post op shoulder ROM HEP, self breast and arm MLD, supine scap yellow    Recommended Other Services  has compression bra and foam inserts, has ordering information for swell spot, has been measured for vest and sleeve but trouble paying for it currently, flexitouch referral has been started    Consulted and Agree with Plan of Care  Patient       Patient will benefit from skilled therapeutic intervention in order to improve the following deficits and impairments:     Visit Diagnosis: Malignant neoplasm of upper-outer quadrant of left breast in female, estrogen receptor positive ([facility])  Abnormal posture  Stiffness of right shoulder, not elsewhere classified  Lymphedema, not elsewhere classified  Stiffness of left shoulder, not  elsewhere classified     Problem List Patient Active Problem List   Diagnosis Date Noted  . Chemotherapy-induced peripheral neuropathy ([place] 03/30/2019  . Port-A-Cath in place 12/15/2018  . Genetic testing 09/21/2018  . Family history of breast cancer   . Malignant neoplasm of upper-outer quadrant of left breast in female, estrogen receptor positive ([place] 08/29/2018  . Hyperlipidemia 08/04/2018  . Obesity (BMI 30-39.9) 05/09/2013  . Preventative health care 11/19/2010  . URI (upper respiratory infection) 11/19/2010  . KNEE PAIN, BILATERAL 09/25/2010  . SINUSITIS - ACUTE-NOS 12/24/2009  . MORBID OBESITY 06/14/2009  . COLD SORE 10/16/2008  . SKIN TAG 06/18/2008  . Hypothyroidism 03/17/2007  . Essential hypertension 03/17/2007    TStark Bray3/12/2019, 10:02 AM  [place] [address]Phone: [phone]  Fax:  [phone] Name: PDinita MigliaccioMRN: [id number] of Birth: 6Oct 24, 1957

## 2019-10-24 ENCOUNTER — Other Ambulatory Visit: Payer: Self-pay

## 2019-10-24 ENCOUNTER — Ambulatory Visit: Payer: BC Managed Care – PPO

## 2019-10-24 DIAGNOSIS — I890 Lymphedema, not elsewhere classified: Secondary | ICD-10-CM

## 2019-10-24 DIAGNOSIS — M25611 Stiffness of right shoulder, not elsewhere classified: Secondary | ICD-10-CM

## 2019-10-24 DIAGNOSIS — C50412 Malignant neoplasm of upper-outer quadrant of left female breast: Secondary | ICD-10-CM | POA: Diagnosis not present

## 2019-10-24 DIAGNOSIS — M25612 Stiffness of left shoulder, not elsewhere classified: Secondary | ICD-10-CM

## 2019-10-24 DIAGNOSIS — R293 Abnormal posture: Secondary | ICD-10-CM

## 2019-10-24 DIAGNOSIS — Z170 Estrogen receptor positive status [ER+]: Secondary | ICD-10-CM

## 2019-10-24 NOTE — Therapy (Signed)
[place], [place], [address] Phone: [phone]   Fax:  [phone]  Physical Therapy Treatment  Patient Details  Name: Kayla Banks MRN: [id number] Date of Birth: [birth year] Referring Provider (PT): Dr. Erroll Luna   Encounter Date: 10/24/2019  PT End of Session - 10/24/19 0905    Visit Number  11    Number of Visits  25    Date for PT Re-Evaluation  11/24/19    Authorization Type  [employer]    PT Start Time  0904    PT Stop Time  0957    PT Time Calculation (min)  53 min    Activity Tolerance  Patient tolerated treatment well    Behavior During Therapy  [facility] for tasks assessed/performed       Past Medical History:  Diagnosis Date  . Complication of anesthesia   . Family history of breast cancer   . Heart murmur    no problems, saw cardiologist 2010  . Hypertension    on meds  . Hypothyroidism   . Osteoarthritis   . Plantar fasciitis   . PONV (postoperative nausea and vomiting)   . Thyroid disease    Hypothyroidism    Past Surgical History:  Procedure Laterality Date  . ABDOMINAL HYSTERECTOMY  02/2012  . AXILLARY LYMPH NODE DISSECTION Left 11/09/2018   Procedure: LEFT AXILLARY LYMPH NODE DISSECTION;  Surgeon: Erroll Luna, MD;  Location: [place];  Service: General;  Laterality: Left;  . BREAST LUMPECTOMY WITH RADIOACTIVE SEED AND SENTINEL LYMPH NODE BIOPSY Left 10/11/2018   Procedure: LEFT BREAST LUMPECTOMY WITH RADIOACTIVE SEED AND LEFT SENTINEL LYMPH NODE MAPPING WITH LEFT TARGETED LYMPH NODE BIOPSY;  Surgeon: Erroll Luna, MD;  Location: [place];  Service: General;  Laterality: Left;  . FOOT SURGERY Left    bone spurs  . LUMBAR LAMINECTOMY  1990  . PORT-A-CATH REMOVAL N/A 05/23/2019   Procedure: PORT REMOVAL;  Surgeon: Erroll Luna, MD;  Location: [place];  Service: General;  Laterality: N/A;  . PORTACATH PLACEMENT Right  12/01/2018   Procedure: INSERTION PORT-A-CATH WITH ULTRASOUND;  Surgeon: Erroll Luna, MD;  Location: [place];  Service: General;  Laterality: Right;    There were no vitals filed for this visit.  Subjective Assessment - 10/24/19 0905    Subjective  Pt states that she has spoke with [employer] and they are going to e-mail her some information. Pt states that the compression bra that she was assisted with purchasing is doing okay. She received it on Friday. She has to wear it with her regular bra due to her L breast falls out the bottom of the compression bra since it is so heavy. She continues to wear her chip pack.    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 Chemo completed with Adriamycin and taxol and radiation also completed.  Hypothyroidism, HTN, obesity, OA bil knees    Patient Stated Goals  decrease breast swelling is the arm swollen    Currently in Pain?  No/denies    Pain Score  0-No pain                  Outpatient Rehab from 08/03/2019 in [place]  Lymphedema Life Impact Scale Total Score  27.94 %  [facility] Adult PT Treatment/Exercise - 10/24/19 0001      Shoulder Exercises: Supine   Horizontal ABduction  Strengthening;Both;10 reps    Horizontal ABduction Weight (lbs)  2    Horizontal ABduction Limitations  demonstration and VC to avoid pain.     External Rotation  Strengthening;Both;10 reps    External Rotation Weight (lbs)  2    External Rotation Limitations  external rotation to internal rotation at ~30 degrees VC to avoid pain and tactile cueing for correct movement through arc of movement.     Flexion  Strengthening;Both;10 reps    Shoulder Flexion Weight (lbs)  1    Flexion Limitations  VC to avoid pain less ROM on the L than the R noted    Diagonals   Left;10 reps;Strengthening    Diagonals Weight (lbs)  1    Diagonals Limitations  tactile cueing for correct movement and VC to avoid pain.     Other Supine Exercises  Supine AA/ROM with dowel 10x with 3 second stretch at end range into flexion    Other Supine Exercises  Supine ER stretch with Abdct hands behind head 10 second elbow pushed toward table 3x       Manual Therapy   Manual Therapy  Manual Lymphatic Drainage (MLD)    Manual Lymphatic Drainage (MLD)  In supine: short neck,  Bil shoulders, Bil axillary and L inguinal nodes, established anterior inter-axillary anastomosis and L axillo-inguinal anastomosis, superior breast toward the anterior inter-axillary anastomosis then re-worked anastomosis, inferior breast toward the L axillo-inguinal anastomosis then re-worked anastomosis, extra time spent at the superior and inferior breast then  reworked all surfaces then deep abodminals.              PT Education - 10/24/19 0956    Education Details  Pt will continue with scapular series at home. She will continue wearing her bra with chip pack for compression and performing MLD at home.    Person(s) Educated  Patient    Methods  Explanation    Comprehension  Verbalized understanding          PT Long Term Goals - 10/20/19 0907      PT LONG TERM GOAL #1   Title  Pt will be ind with self breast and upper arm MLD or obtain compression pump    Status  Achieved      PT LONG TERM GOAL #2   Title  Pt will obtain appropriate bra and sleeve for lymphedema management    Baseline  pt has been measured for both but has to hold funds.    Status  Partially Met      PT LONG TERM GOAL #3   Title  Pt will demonstrate low fall risk on the BERG balance scale or similar in the clinic    Status  Achieved      PT LONG TERM GOAL #4   Title  Pt will decrease LLIS to 17% or less    Status  On-going            Plan - 10/24/19 0904    Clinical Impression Statement  Pt presents with her  compression on this session. She has to wear her regulra bra underneath (which does not have a wire) due to her L breast is falling out of the compression bra secondary to weight of breast. Pt continues to decrease fluid as demonstrated by softening of the breast and skin creping with MLD; MLD was performed this session  in supine.  Pt was able to perform scapular series using weight this session with added external rotation/internal rotation using weight. Pt has significant tightness noted at the L pectoralis muscle. Pt will benefit from continued POC at this time. .    Personal Factors and Comorbidities  Age;Fitness;Comorbidity 2    Comorbidities  radiation, ALND    Examination-Activity Limitations  Lift    Examination-Participation Restrictions  Yard Work;Community Activity    Rehab Potential  Excellent    Clinical Impairments Affecting Rehab Potential  ALND, radiation, size of breast    PT Frequency  2x / week    PT Duration  6 weeks    PT Treatment/Interventions  ADLs/Self Care Home Management;Patient/family education;Therapeutic exercise;Manual lymph drainage;Compression bandaging;Manual techniques;Passive range of motion;Taping;Neuromuscular re-education;Balance training    PT Next Visit Plan  heard anything else from [employer]: cont left breast MLD also working on left upper arm clearance,  LUE strengthening, Pectoralis stretching/stretching/contract relax    PT Home Exercise Plan  Post op shoulder ROM HEP, self breast and arm MLD, supine scap yellow    Consulted and Agree with Plan of Care  Patient       Patient will benefit from skilled therapeutic intervention in order to improve the following deficits and impairments:  Impaired UE functional use, Pain, Postural dysfunction, Decreased knowledge of precautions, Decreased range of motion, Increased edema, Decreased skin integrity  Visit Diagnosis: Malignant neoplasm of upper-outer quadrant of left breast in female, estrogen receptor  positive (HCC)  Abnormal posture  Stiffness of right shoulder, not elsewhere classified  Lymphedema, not elsewhere classified  Stiffness of left shoulder, not elsewhere classified     Problem List Patient Active Problem List   Diagnosis Date Noted  . Chemotherapy-induced peripheral neuropathy ([place]) 03/30/2019  . Port-A-Cath in place 12/15/2018  . Genetic testing 09/21/2018  . Family history of breast cancer   . Malignant neoplasm of upper-outer quadrant of left breast in female, estrogen receptor positive ([place]) 08/29/2018  . Hyperlipidemia 08/04/2018  . Obesity (BMI 30-39.9) 05/09/2013  . Preventative health care 11/19/2010  . URI (upper respiratory infection) 11/19/2010  . KNEE PAIN, BILATERAL 09/25/2010  . SINUSITIS - ACUTE-NOS 12/24/2009  . MORBID OBESITY 06/14/2009  . COLD SORE 10/16/2008  . SKIN TAG 06/18/2008  . Hypothyroidism 03/17/2007  . Essential hypertension 03/17/2007    Ander Purpura, PT 10/24/2019, 10:00 AM  [place], [place], [address] Phone: [phone]   Fax:  [phone]  Name: Mellonie Guess MRN: [id number] Date of Birth: November 13, 1955

## 2019-10-25 ENCOUNTER — Other Ambulatory Visit: Payer: Self-pay

## 2019-10-25 ENCOUNTER — Inpatient Hospital Stay: Payer: BC Managed Care – PPO | Attending: Hematology and Oncology | Admitting: Adult Health

## 2019-10-25 VITALS — BP 162/65 | HR 80 | Temp 98.2°F | Resp 18 | Ht 65.0 in | Wt 255.9 lb

## 2019-10-25 DIAGNOSIS — Z833 Family history of diabetes mellitus: Secondary | ICD-10-CM | POA: Insufficient documentation

## 2019-10-25 DIAGNOSIS — E2839 Other primary ovarian failure: Secondary | ICD-10-CM | POA: Diagnosis not present

## 2019-10-25 DIAGNOSIS — Z923 Personal history of irradiation: Secondary | ICD-10-CM | POA: Diagnosis not present

## 2019-10-25 DIAGNOSIS — E039 Hypothyroidism, unspecified: Secondary | ICD-10-CM | POA: Diagnosis not present

## 2019-10-25 DIAGNOSIS — Z8249 Family history of ischemic heart disease and other diseases of the circulatory system: Secondary | ICD-10-CM | POA: Insufficient documentation

## 2019-10-25 DIAGNOSIS — Z9221 Personal history of antineoplastic chemotherapy: Secondary | ICD-10-CM | POA: Insufficient documentation

## 2019-10-25 DIAGNOSIS — Z79899 Other long term (current) drug therapy: Secondary | ICD-10-CM | POA: Insufficient documentation

## 2019-10-25 DIAGNOSIS — C50412 Malignant neoplasm of upper-outer quadrant of left female breast: Secondary | ICD-10-CM | POA: Diagnosis present

## 2019-10-25 DIAGNOSIS — Z79811 Long term (current) use of aromatase inhibitors: Secondary | ICD-10-CM | POA: Insufficient documentation

## 2019-10-25 DIAGNOSIS — I10 Essential (primary) hypertension: Secondary | ICD-10-CM | POA: Insufficient documentation

## 2019-10-25 DIAGNOSIS — Z8349 Family history of other endocrine, nutritional and metabolic diseases: Secondary | ICD-10-CM | POA: Insufficient documentation

## 2019-10-25 DIAGNOSIS — Z791 Long term (current) use of non-steroidal anti-inflammatories (NSAID): Secondary | ICD-10-CM | POA: Insufficient documentation

## 2019-10-25 DIAGNOSIS — Z803 Family history of malignant neoplasm of breast: Secondary | ICD-10-CM | POA: Diagnosis not present

## 2019-10-25 DIAGNOSIS — Z170 Estrogen receptor positive status [ER+]: Secondary | ICD-10-CM | POA: Insufficient documentation

## 2019-10-25 DIAGNOSIS — Z90710 Acquired absence of both cervix and uterus: Secondary | ICD-10-CM | POA: Insufficient documentation

## 2019-10-25 NOTE — Progress Notes (Signed)
SURVIVORSHIP VISIT:  BRIEF ONCOLOGIC HISTORY:  Oncology History  Malignant neoplasm of upper-outer quadrant of left breast in female, estrogen receptor positive ([place])  08/24/2018 Initial Biopsy   Screening detected left breast mass at 2:30 position 6 mm in size with one abnormal lymph node and stable benign calcifications, biopsy of the mass grade 2 IDC with DCIS and lymphovascular invasion, lymph node biopsy is positive, ER 95%, PR 40%, Ki-67 20%, HER-2 -1+ by IHC, T1b N1 stage Ib   08/31/2018 Cancer Staging   Staging form: Breast, AJCC 8th Edition - Clinical: Stage IB (cT1b, cN1(f), cM0, G2, ER+, PR+, HER2-) - Signed by Nicholas Lose, MD on 08/31/2018   09/21/2018 Genetic Testing   Genetic testing performed through [facility]'s Common Hereditary Cancers Panel reported out on 09/19/2018 showed no pathogenic mutations.  The Common Hereditary Cancer Panel offered by [facility] includes sequencing and/or deletion duplication testing of the following 53 genes: APC, ATM, AXIN2, BARD1, BMPR1A, BRCA1, BRCA2, BRIP1, BUB1B, CDH1, CDK4, CDKN2A, CHEK2, CTNNA1, DICER1, ENG, EPCAM, GALNT12, GREM1, HOXB13, KIT, MEN1, MLH1, MLH3, MSH2, MSH3, MSH6, MUTYH, NBN, NF1, NTHL1, PALB2, PDGFRA, PMS2, POLD1, POLE, PTEN, RAD50, RAD51C, RAD51D, RNF43, RPS20, SDHA, SDHB, SDHC, SDHD, SMAD4, SMARCA4, STK11, TP53, TSC1, TSC2, VHL.   A variant of uncertain significance (VUS) in a gene called NBN was also noted. c.1979G>C (p.Arg660Thr)   10/18/2018 Cancer Staging   Staging form: Breast, AJCC 8th Edition - Pathologic stage from 10/18/2018: Stage IB (pT1b, pN2a, cM0, G2, ER+, PR+, HER2-) - Signed by Nicholas Lose, MD on 11/14/2018   11/09/2018 Surgery   Left lumpectomy ([facility]) [phone]): Grade 2 IDC with DCIS, 1 cm, margins negative, DCIS focally 0.1 cm from superior margin, 2/2 lymph nodes positive, ER 95%, PR 45%, HER-2 negative, Ki-67 20% AXLND: 3/20 LN Pos (total 5/22)   12/15/2018 -  Chemotherapy   DOXOrubicin (ADRIAMYCIN) chemo  injection 142 mg, 60 mg/m2 = 142 mg, Intravenous,  Once, 4 of 4 cycles. Administration: 142 mg (12/15/2018), 142 mg (12/29/2018), 142 mg (01/12/2019), 142 mg (01/26/2019)  palonosetron (ALOXI) injection 0.25 mg, 0.25 mg, Intravenous,  Once, 4 of 4 cycles. Administration: 0.25 mg (12/15/2018), 0.25 mg (12/29/2018), 0.25 mg (01/12/2019), 0.25 mg (01/26/2019)  pegfilgrastim-cbqv (UDENYCA) injection 6 mg, 6 mg, Subcutaneous, Once, 4 of 4 cycles. Administration: 6 mg (12/17/2018), 6 mg (12/31/2018), 6 mg (01/14/2019), 6 mg (01/28/2019)  cyclophosphamide (CYTOXAN) 1,420 mg in sodium chloride 0.9 % 250 mL chemo infusion, 600 mg/m2 = 1,420 mg, Intravenous,  Once, 4 of 4 cycles. Administration: 1,420 mg (12/15/2018), 1,420 mg (12/29/2018), 1,420 mg (01/12/2019), 1,420 mg (01/26/2019)  PACLitaxel (TAXOL) 192 mg in sodium chloride 0.9 % 250 mL chemo infusion (</= '80mg'$ /m2), 80 mg/m2 = 192 mg, Intravenous,  Once, 11 of 12 cycles. Dose modification: 60 mg/m2 (original dose 80 mg/m2, Cycle 12, Reason: Dose not tolerated). Administration: 192 mg (02/09/2019), 192 mg (02/16/2019), 192 mg (02/23/2019), 192 mg (03/02/2019), 192 mg (03/09/2019), 192 mg (03/16/2019), 192 mg (03/23/2019), 144 mg (03/30/2019), 144 mg (04/06/2019), 144 mg (04/14/2019), 144 mg (04/20/2019)  fosaprepitant (EMEND) 150 mg  dexamethasone (DECADRON) 12 mg in sodium chloride 0.9 % 145 mL IVPB, , Intravenous,  Once, 4 of 4 cycles Administration:  (12/15/2018),  (12/29/2018),  (01/12/2019),  (01/26/2019)     06/01/2019 - 07/21/2019 Radiation Therapy   The patient initially received a dose of 50.4 Gy in 28 fractions to the breast using whole-breast tangent fields. This was delivered using a 3-D conformal technique. The pt received a boost delivering an additional 10  Gy in 5 fractions using a electron boost with 34mV electrons. The total dose was 60.4 Gy.    08/2019 - 08/2024 Anti-estrogen oral therapy   Anastrozole     INTERVAL HISTORY:  Ms. OThiento review her survivorship  care plan detailing her treatment course for breast cancer, as well as monitoring long-term side effects of that treatment, education regarding health maintenance, screening, and overall wellness and health promotion.     Overall, Ms. Munce reports feeling quite well.  She is taking Anastrozole daily and is tolerating this moderately well.    REVIEW OF SYSTEMS:  Review of Systems  Constitutional: Negative for appetite change, chills, fatigue, fever and unexpected weight change.  HENT:   Negative for hearing loss, lump/mass and trouble swallowing.   Eyes: Negative for eye problems and icterus.  Respiratory: Negative for chest tightness, cough and shortness of breath.   Cardiovascular: Negative for chest pain, leg swelling and palpitations.  Gastrointestinal: Negative for abdominal distention, abdominal pain, constipation, diarrhea, nausea and vomiting.  Endocrine: Negative for hot flashes.  Genitourinary: Negative for difficulty urinating.   Musculoskeletal: Negative for arthralgias.  Skin: Negative for itching and rash.  Neurological: Negative for dizziness, extremity weakness, headaches and numbness.  Hematological: Negative for adenopathy. Does not bruise/bleed easily.  Psychiatric/Behavioral: Negative for depression. The patient is not nervous/anxious.   Breast: Denies any new nodularity, masses, tenderness, nipple changes, or nipple discharge.      ONCOLOGY TREATMENT TEAM:  1. Surgeon[facility]  [facility] 2. Medical Oncologist: Dr. GLindi Adie 3. Radiation Oncologist: Dr. MLisbeth Renshaw   PAST MEDICAL/SURGICAL HISTORY:  Past Medical History:  Diagnosis Date  . Complication of anesthesia   . Family history of breast cancer   . Heart murmur    no problems, saw cardiologist 2010  . Hypertension    on meds  . Hypothyroidism   . Osteoarthritis   . Plantar fasciitis   . PONV (postoperative nausea and vomiting)   . Thyroid disease    Hypothyroidism   Past  Surgical History:  Procedure Laterality Date  . ABDOMINAL HYSTERECTOMY  02/2012  . AXILLARY LYMPH NODE DISSECTION Left 11/09/2018   Procedure: LEFT AXILLARY LYMPH NODE DISSECTION;  Surgeon: CErroll Luna MD;  [facility] [facility]  Service: General;  Laterality: Left;  . BREAST LUMPECTOMY WITH RADIOACTIVE SEED AND SENTINEL LYMPH NODE BIOPSY Left 10/11/2018   Procedure: LEFT BREAST LUMPECTOMY WITH RADIOACTIVE SEED AND LEFT SENTINEL LYMPH NODE MAPPING WITH LEFT TARGETED LYMPH NODE BIOPSY;  Surgeon: CErroll Luna MD;  [place] [facility]  Service: General;  Laterality: Left;  . FOOT SURGERY Left    bone spurs  . LUMBAR LAMINECTOMY  1990  . PORT-A-CATH REMOVAL N/A 05/23/2019   Procedure: PORT REMOVAL;  Surgeon: CErroll Luna MD;  [facility] [facility]  Service: General;  Laterality: N/A;  . PORTACATH PLACEMENT Right 12/01/2018   Procedure: INSERTION PORT-A-CATH WITH ULTRASOUND;  Surgeon: CErroll Luna MD;  [place] [facility]  Service: General;  Laterality: Right;     ALLERGIES:  Allergies  Allergen Reactions  . Erythromycin Swelling    Lip swelling; angioedema  . Lisinopril Swelling    REACTION: angioedema  . Penicillins Hives and Swelling    Swelling in arms & hands  . Adhesive [Tape] Rash     CURRENT MEDICATIONS:  Outpatient Encounter Medications as of 10/25/2019  Medication Sig Note  . amLODipine (NORVASC) 5 MG tablet Take 1 tablet (5 mg  total) by mouth daily. Needs ov before any more refills   . anastrozole (ARIMIDEX) 1 MG tablet Take 1 tablet (1 mg total) by mouth daily.   . carvedilol (COREG) 25 MG tablet TAKE 1 TABLET(25 MG) BY MOUTH TWICE DAILY WITH A MEAL. Needs ov before any more refills   . EPINEPHRINE 0.3 mg/0.3 mL IJ SOAJ injection INJECT 0.3 ML(1 SYRINGE) IN THE MUSCLE AS NEEDED FOR ALLERGIC REACTION 11/09/2018: Has but never used  . furosemide (LASIX) 20 MG tablet Needs ov before any more refills    . gabapentin (NEURONTIN) 300 MG capsule TAKE 1 CAPSULE(300 MG) BY MOUTH AT BEDTIME   . ibuprofen (ADVIL) 800 MG tablet Take 1 tablet (800 mg total) by mouth every 8 (eight) hours as needed.   . meloxicam (MOBIC) 15 MG tablet TAKE 1 TABLET(15 MG) BY MOUTH DAILY AS NEEDED FOR PAIN   . SYNTHROID 100 MCG tablet TAKE 1 TABLET BY MOUTH EVERY DAY   . valACYclovir (VALTREX) 1000 MG tablet TAKE 1 TABLET BY MOUTH THREE TIMES DAILY AS NEEDED   . clindamycin (CLEOCIN) 300 MG capsule Take 1 capsule (300 mg total) by mouth 4 (four) times daily. (Patient not taking: Reported on 10/25/2019)   . LORazepam (ATIVAN) 0.5 MG tablet TAKE 1 TABLET(0.5 MG) BY MOUTH AT BEDTIME AS NEEDED FOR SLEEP (Patient not taking: Reported on 10/25/2019)   . metroNIDAZOLE (METROGEL) 0.75 % gel Apply 1 application topically 2 (two) times daily. (Patient not taking: Reported on 10/25/2019)    No facility-administered encounter medications on file as of 10/25/2019.     ONCOLOGIC FAMILY HISTORY:  Family History  Problem Relation Age of Onset  . Diabetes Mother   . Hyperlipidemia Mother   . Hypertension Mother   . Hypertension Father   . Heart disease Father 73       ?MI  . Hypothyroidism Sister   . Hyperthyroidism Sister   . Breast cancer Maternal Aunt        dx 60's/70's  . Breast cancer Other        dx early 48's     GENETIC COUNSELING/TESTING: See above  SOCIAL HISTORY:  Social History   Socioeconomic History  . Marital status: Single    Spouse name: Not on file  . Number of children: Not on file  . Years of education: Not on file  . Highest education level: Not on file  Occupational History  . 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 Smoking status: Never Smoker  . Smokeless tobacco: Never Used  Substance and Sexual Activity  . Alcohol use: Yes    Comment: occassionally  . Drug use: No  . Sexual activity: Yes    Partners: Male  Other Topics Concern  . Not on file  Social History Narrative    Exercise-- no   Social Determinants of Health   Financial Resource Strain:   . Difficulty of Paying Living Expenses:   Food Insecurity:   . Worried About Charity fundraiser in the Last Year:   . Arboriculturist in the Last Year:   Transportation Needs:   . Film/video editor (Medical):   Marland Kitchen Lack of Transportation (Non-Medical):   Physical Activity:   . Days of Exercise per Week:   . Minutes of Exercise per Session:   Stress:   . Feeling of Stress :   Social Connections:   . Frequency of Communication with Friends and Family:   .  Frequency of Social Gatherings with Friends and Family:   . Attends Religious Services:   . Active Member of Clubs or Organizations:   . Attends Archivist Meetings:   Marland Kitchen Marital Status:   Intimate Partner Violence:   . Fear of Current or Ex-Partner:   . Emotionally Abused:   Marland Kitchen Physically Abused:   . Sexually Abused:      OBSERVATIONS/OBJECTIVE:  BP (!) 162/65 (BP Location: Left Arm, Patient Position: Sitting)   Pulse 80   Temp 98.2 F (36.8 C) (Temporal)   Resp 18   Ht _0  (1.651 m)   Wt 255 lb 14.4 oz (116.1 kg)   LMP 01/28/2012   SpO2 100%   BMI 42.58 kg/m  GENERAL: Patient is a well appearing female in no acute distress HEENT:  Sclerae anicteric.  Oropharynx clear and moist. No ulcerations or evidence of oropharyngeal candidiasis. Neck is supple.  NODES:  No cervical, supraclavicular, or axillary lymphadenopathy palpated.  BREAST EXAM:  Left breast s/p lumpectomy and radiation, no sign of local recurrence; right breast benign LUNGS:  Clear to auscultation bilaterally.  No wheezes or rhonchi. HEART:  Regular rate and rhythm. No murmur appreciated. ABDOMEN:  Soft, nontender.  Positive, normoactive bowel sounds. No organomegaly palpated. MSK:  No focal spinal tenderness to palpation. Full range of motion bilaterally in the upper extremities. EXTREMITIES:  No peripheral edema.   SKIN:  Clear with no obvious rashes or skin  changes. No nail dyscrasia. NEURO:  Nonfocal. Well oriented.  Appropriate affect.    LABORATORY DATA:  None for this visit.  DIAGNOSTIC IMAGING:  None for this visit.      ASSESSMENT AND PLAN:  Ms.. Paganelli is a pleasant 64 y.o. female with Stage IB left breast invasive ductal carcinoma, ER+/PR+/HER2-, diagnosed in 08/2018, treated with lumpectomy, adjuvant chemotherapy, adjuvant radiation therapy, and anti-estrogen therapy with Anastrozole beginning in 08/2019.  She presents to the Survivorship Clinic for our initial meeting and routine follow-up post-completion of treatment for breast cancer.    1. Stage IB left breast cancer:  Ms. Hammonds is continuing to recover from definitive treatment for breast cancer. She will follow-up with her medical oncologist, Dr. Lindi Adie in 6 months with history and physical exam per surveillance protocol.  She will continue her anti-estrogen therapy with Anastrozole. Thus far, she is tolerating the Anastrozole well, with minimal side effects. She was instructed to make Dr. Lindi Adie or myself aware if she begins to experience any worsening side effects of the medication and I could see her back in clinic to help manage those side effects, as needed. Her mammogram is due 01/2020; orders placed today. Today, a comprehensive survivorship care plan and treatment summary was reviewed with the patient today detailing her breast cancer diagnosis, treatment course, potential late/long-term effects of treatment, appropriate follow-up care with recommendations for the future, and patient education resources.  A copy of this summary, along with a letter will be sent to the patient's primary care provider via mail/fax/In Basket message after today's visit.    2. Bone health:  Given Ms. Heiberger's age/history of breast cancer and her current treatment regimen including anti-estrogen therapy with Anastrozole, she is at risk for bone demineralization.  She will undergo bone density  testing when she is scheduled for her mammogram.  She was given education on specific activities to promote bone health.  3. Cancer screening:  Due to Ms. Bowens's history and her age, she should receive screening for skin cancers, colon cancer,  and gynecologic cancers.  The information and recommendations are listed on the patient's comprehensive care plan/treatment summary and were reviewed in detail with the patient.    4. Health maintenance and wellness promotion: Ms. Sproule was encouraged to consume 5-7 servings of fruits and vegetables per day. We reviewed the "Nutrition Rainbow" handout, as well as the handout "Take Control of Your Health and Reduce Your Cancer Risk" from the [facility].  She was also encouraged to engage in moderate to vigorous exercise for 30 minutes per day most days of the week. We discussed the [employer] fitness program, which is designed for cancer survivors to help them become more physically fit after cancer treatments.  She was instructed to limit her alcohol consumption and continue to abstain from tobacco use.     5. Support services/counseling: It is not uncommon for this period of the patient's cancer care trajectory to be one of many emotions and stressors.  We discussed how this can be increasingly difficult during the times of quarantine and social distancing due to the COVID-19 pandemic.   She was given information regarding our available services and encouraged to contact me with any questions or for help enrolling in any of our support group/programs.    Follow up instructions:    -Return to cancer center in 04/2020  -Mammogram due in 01/2020   The patient was advised to call back or seek an in-person evaluation if the symptoms worsen or if the condition fails to improve as anticipated.   Total encounter time: 30 minutes*  Wilber Bihari, NP 10/27/19 2:35 PM Medical Oncology and Hematology [facility] [place], [place] [address] Tel. [phone]    Fax. [phone]  *Total Encounter Time as defined by the Centers for Medicare and Medicaid Services includes, in addition to the face-to-face time of a patient visit (documented in the note above) non-face-to-face time: obtaining and reviewing outside history, ordering and reviewing medications, tests or procedures, care coordination (communications with other health care professionals or caregivers) and documentation in the medical record.

## 2019-10-26 ENCOUNTER — Ambulatory Visit: Payer: BC Managed Care – PPO

## 2019-10-26 ENCOUNTER — Telehealth: Payer: Self-pay | Admitting: Adult Health

## 2019-10-26 DIAGNOSIS — C50412 Malignant neoplasm of upper-outer quadrant of left female breast: Secondary | ICD-10-CM | POA: Diagnosis not present

## 2019-10-26 DIAGNOSIS — I890 Lymphedema, not elsewhere classified: Secondary | ICD-10-CM

## 2019-10-26 DIAGNOSIS — M25611 Stiffness of right shoulder, not elsewhere classified: Secondary | ICD-10-CM

## 2019-10-26 DIAGNOSIS — M25612 Stiffness of left shoulder, not elsewhere classified: Secondary | ICD-10-CM

## 2019-10-26 DIAGNOSIS — Z170 Estrogen receptor positive status [ER+]: Secondary | ICD-10-CM

## 2019-10-26 DIAGNOSIS — R293 Abnormal posture: Secondary | ICD-10-CM

## 2019-10-26 NOTE — Telephone Encounter (Signed)
Scheduled appt per 3/10 los. Left voicemail with appt details.

## 2019-10-26 NOTE — Therapy (Signed)
[place], [place], [address] Phone: [phone]   Fax:  [phone]  Physical Therapy Treatment  Patient Details  Name: Kayla [place] MRN: [id number] Date of Birth: 16-Mar-1956 Referring Provider (PT): Dr. Erroll Luna   Encounter Date: 10/26/2019  PT End of Session - 10/26/19 0905    Visit Number  12    Number of Visits  25    Date for PT Re-Evaluation  11/24/19    Authorization Type  BCBS    PT Start Time  0905    PT Stop Time  1000    PT Time Calculation (min)  55 min    Activity Tolerance  Patient tolerated treatment well    Behavior During Therapy  [facility] for tasks assessed/performed       Past Medical History:  Diagnosis Date  . Complication of anesthesia   . Family history of breast cancer   . Heart murmur    no problems, saw cardiologist 2010  . Hypertension    on meds  . Hypothyroidism   . Osteoarthritis   . Plantar fasciitis   . PONV (postoperative nausea and vomiting)   . Thyroid disease    Hypothyroidism    Past Surgical History:  Procedure Laterality Date  . ABDOMINAL HYSTERECTOMY  02/2012  . AXILLARY LYMPH NODE DISSECTION Left 11/09/2018   Procedure: LEFT AXILLARY LYMPH NODE DISSECTION;  Surgeon: Erroll Luna, MD;  Location: [place];  Service: General;  Laterality: Left;  . BREAST LUMPECTOMY WITH RADIOACTIVE SEED AND SENTINEL LYMPH NODE BIOPSY Left 10/11/2018   Procedure: LEFT BREAST LUMPECTOMY WITH RADIOACTIVE SEED AND LEFT SENTINEL LYMPH NODE MAPPING WITH LEFT TARGETED LYMPH NODE BIOPSY;  Surgeon: Erroll Luna, MD;  Location: [place];  Service: General;  Laterality: Left;  . FOOT SURGERY Left    bone spurs  . LUMBAR LAMINECTOMY  1990  . PORT-A-CATH REMOVAL N/A 05/23/2019   Procedure: PORT REMOVAL;  Surgeon: Erroll Luna, MD;  Location: [place];  Service: General;  Laterality: N/A;  . PORTACATH PLACEMENT Right  12/01/2018   Procedure: INSERTION PORT-A-CATH WITH ULTRASOUND;  Surgeon: Erroll Luna, MD;  Location: [place];  Service: General;  Laterality: Right;    There were no vitals filed for this visit.  Subjective Assessment - 10/26/19 0906    Subjective  Pt states that she was not able to check her e-mail since her last visit so has not heard anything else from tactile medical. She states that her compresion bra feels good when she wears it but she isn't sure if it is helping or not.    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 Chemo completed with Adriamycin and taxol and radiation also completed.  Hypothyroidism, HTN, obesity, OA bil knees    Patient Stated Goals  decrease breast swelling is the arm swollen    Currently in Pain?  No/denies    Pain Score  0-No pain                  Outpatient Rehab from 08/03/2019 in [place]  Lymphedema Life Impact Scale Total Score  27.94 %           [facility] Adult PT Treatment/Exercise - 10/26/19 0001      Exercises   Exercises  Other Exercises  Other Exercises   Began strength ABC exercises to page 9 performed unilateral pect stretch, posterior shoulder stretch, tricep stretch, gastroc stretch, quad stretch with modifications written on sheet and 30 second holds.       Manual Therapy   Manual Therapy  Manual Lymphatic Drainage (MLD)    Manual Lymphatic Drainage (MLD)  In supine: short neck,  Bil shoulders, Bil axillary and L inguinal nodes, established anterior inter-axillary anastomosis and L axillo-inguinal anastomosis, superior breast toward the anterior inter-axillary anastomosis then re-worked anastomosis, inferior breast toward the L axillo-inguinal anastomosis then re-worked anastomosis, extra time spent at the superior and inferior breast then   reworked all surfaces then deep abodminals.              PT Education - 10/26/19 1104    Education Details  Pt will begin the ABC program exercises at home. She will continue with chip pack, compression bra and scapular series. She will check her e-mail and contact [facility].    Person(s) Educated  Patient    Methods  Explanation    Comprehension  Verbalized understanding          PT Long Term Goals - 10/20/19 0907      PT LONG TERM GOAL #1   Title  Pt will be ind with self breast and upper arm MLD or obtain compression pump    Status  Achieved      PT LONG TERM GOAL #2   Title  Pt will obtain appropriate bra and sleeve for lymphedema management    Baseline  pt has been measured for both but has to hold funds.    Status  Partially Met      PT LONG TERM GOAL #3   Title  Pt will demonstrate low fall risk on the BERG balance scale or similar in the clinic    Status  Achieved      PT LONG TERM GOAL #4   Title  Pt will decrease LLIS to 17% or less    Status  On-going            Plan - 10/26/19 0905    Clinical Impression Statement  Pt presents with her compression on this session. Pt continues to decrease fluid and has increased softening of the L breast following MLD; MLD was performed this session in supine. ABC strength program was initiated this session. She is trying to get funds together for night vest and other compression garments. Pt will benefit from continued POC at this time.    Personal Factors and Comorbidities  Age;Fitness;Comorbidity 2    Comorbidities  radiation, ALND    Examination-Activity Limitations  Lift    Examination-Participation Restrictions  Yard Work;Community Activity    Rehab Potential  Excellent    Clinical Impairments Affecting Rehab Potential  ALND, radiation, size of breast    PT Frequency  2x / week    PT Duration  6 weeks    PT Treatment/Interventions  ADLs/Self Care Home Management;Patient/family education;Therapeutic  exercise;Manual lymph drainage;Compression bandaging;Manual techniques;Passive range of motion;Taping;Neuromuscular re-education;Balance training    PT Next Visit Plan  heard anything else from [facility] medical? Continue with strength ABC, cont left breast MLD also working on left upper arm clearance,  LUE strengthening, Pectoralis stretching/stretching/contract relax    PT Home Exercise Plan  Post op shoulder ROM HEP, self breast and arm MLD, supine scap yellow    Consulted and Agree with Plan of Care  Patient  Patient will benefit from skilled therapeutic intervention in order to improve the following deficits and impairments:  Impaired UE functional use, Pain, Postural dysfunction, Decreased knowledge of precautions, Decreased range of motion, Increased edema, Decreased skin integrity  Visit Diagnosis: Malignant neoplasm of upper-outer quadrant of left breast in female, estrogen receptor positive ([facility])  Abnormal posture  Stiffness of right shoulder, not elsewhere classified  Lymphedema, not elsewhere classified  Stiffness of left shoulder, not elsewhere classified     Problem List Patient Active Problem List   Diagnosis Date Noted  . Chemotherapy-induced peripheral neuropathy ([place]) 03/30/2019  . Port-A-Cath in place 12/15/2018  . Genetic testing 09/21/2018  . Family history of breast cancer   . Malignant neoplasm of upper-outer quadrant of left breast in female, estrogen receptor positive ([place]) 08/29/2018  . Hyperlipidemia 08/04/2018  . Obesity (BMI 30-39.9) 05/09/2013  . Preventative health care 11/19/2010  . URI (upper respiratory infection) 11/19/2010  . KNEE PAIN, BILATERAL 09/25/2010  . SINUSITIS - ACUTE-NOS 12/24/2009  . MORBID OBESITY 06/14/2009  . COLD SORE 10/16/2008  . SKIN TAG 06/18/2008  . Hypothyroidism 03/17/2007  . Essential hypertension 03/17/2007    Ander Purpura, PT 10/26/2019, 11:08 AM  [place], [place], [address] Phone: [phone]   Fax:  [phone]  Name: Marizol Borror MRN: [id number] Date of Birth: 1956/06/30

## 2019-10-27 ENCOUNTER — Encounter: Payer: BC Managed Care – PPO | Admitting: Adult Health

## 2019-10-30 ENCOUNTER — Encounter: Payer: Self-pay | Admitting: Licensed Clinical Social Worker

## 2019-10-30 NOTE — Progress Notes (Signed)
[facility] Quality of Life Screening Clinical Social Work  Clinical Social Work was referred by survivorship quality of life screening protocol.  The patient scored a 43.33 on the Quality of Life/ Cancer Survivor (QOL-CS) scale which indicates high quality of life.   Based on screener and patient report, financial needs are a concern.  CSW provided [employer]- grants to apply to for financial assistance with compression bras.    Follow up plan: 1. N/A 2. Follow-up QOL screen to be sent in 1 months.     Annika Selke E, LCSW

## 2019-10-31 ENCOUNTER — Ambulatory Visit: Payer: BC Managed Care – PPO

## 2019-11-02 ENCOUNTER — Ambulatory Visit: Payer: BC Managed Care – PPO

## 2019-11-07 ENCOUNTER — Other Ambulatory Visit: Payer: Self-pay

## 2019-11-07 ENCOUNTER — Ambulatory Visit: Payer: BC Managed Care – PPO

## 2019-11-07 DIAGNOSIS — R293 Abnormal posture: Secondary | ICD-10-CM

## 2019-11-07 DIAGNOSIS — M25611 Stiffness of right shoulder, not elsewhere classified: Secondary | ICD-10-CM

## 2019-11-07 DIAGNOSIS — C50412 Malignant neoplasm of upper-outer quadrant of left female breast: Secondary | ICD-10-CM | POA: Diagnosis not present

## 2019-11-07 DIAGNOSIS — I890 Lymphedema, not elsewhere classified: Secondary | ICD-10-CM

## 2019-11-07 DIAGNOSIS — Z170 Estrogen receptor positive status [ER+]: Secondary | ICD-10-CM

## 2019-11-07 DIAGNOSIS — M25612 Stiffness of left shoulder, not elsewhere classified: Secondary | ICD-10-CM

## 2019-11-07 NOTE — Therapy (Signed)
[place], [place], [address] Phone: [phone]   Fax:  [phone]  Physical Therapy Treatment  Patient Details  Name: Kayla Banks MRN: [id number] Date of Birth: [birth year] Referring Provider (PT): Dr. Erroll Luna   Encounter Date: 11/07/2019  PT End of Session - 11/07/19 0908    Visit Number  13    Number of Visits  25    Date for PT Re-Evaluation  11/24/19    Authorization Type  BCBS    PT Start Time  0907    PT Stop Time  1000    PT Time Calculation (min)  53 min    Activity Tolerance  Patient tolerated treatment well    Behavior During Therapy  [facility] for tasks assessed/performed       Past Medical History:  Diagnosis Date  . Complication of anesthesia   . Family history of breast cancer   . Heart murmur    no problems, saw cardiologist 2010  . Hypertension    on meds  . Hypothyroidism   . Osteoarthritis   . Plantar fasciitis   . PONV (postoperative nausea and vomiting)   . Thyroid disease    Hypothyroidism    Past Surgical History:  Procedure Laterality Date  . ABDOMINAL HYSTERECTOMY  02/2012  . AXILLARY LYMPH NODE DISSECTION Left 11/09/2018   Procedure: LEFT AXILLARY LYMPH NODE DISSECTION;  Surgeon: Erroll Luna, MD;  Location: [place];  Service: General;  Laterality: Left;  . BREAST LUMPECTOMY WITH RADIOACTIVE SEED AND SENTINEL LYMPH NODE BIOPSY Left 10/11/2018   Procedure: LEFT BREAST LUMPECTOMY WITH RADIOACTIVE SEED AND LEFT SENTINEL LYMPH NODE MAPPING WITH LEFT TARGETED LYMPH NODE BIOPSY;  Surgeon: Erroll Luna, MD;  Location: [place];  Service: General;  Laterality: Left;  . FOOT SURGERY Left    bone spurs  . LUMBAR LAMINECTOMY  1990  . PORT-A-CATH REMOVAL N/A 05/23/2019   Procedure: PORT REMOVAL;  Surgeon: Erroll Luna, MD;  Location: [place];  Service: General;  Laterality: N/A;  . PORTACATH PLACEMENT Right  12/01/2018   Procedure: INSERTION PORT-A-CATH WITH ULTRASOUND;  Surgeon: Erroll Luna, MD;  Location: [place];  Service: General;  Laterality: Right;    There were no vitals filed for this visit.  Subjective Assessment - 11/07/19 0908    Subjective  Pt states that she has heard from [facility] and needs to call them today. She states that she feels pretty good overall but the swelling has been about the same.    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 Chemo completed with Adriamycin and taxol and radiation also completed.  Hypothyroidism, HTN, obesity, OA bil knees    Patient Stated Goals  decrease breast swelling is the arm swollen    Currently in Pain?  No/denies    Pain Score  0-No pain                  Outpatient Rehab from 08/03/2019 in [place]  Lymphedema Life Impact Scale Total Score  27.94 %           [facility] Adult PT Treatment/Exercise - 11/07/19 0001      Exercises   Exercises  Other Exercises    Other Exercises   worked on strength ABC: butterfly stretch, Seated hamstring  stretch, lower trunk rotation stretch, supine bridge, pelvic tilt w/marching into pelvic tilt with heel taps stayed at pelvic tilt w/marching due to weakness, chest press 2#, modified bird dog on raised plinth table demonstration thoughout with VC for correct movement and modifications when appropriate for bird dog and crunch      Manual Therapy   Manual Therapy  Manual Lymphatic Drainage (MLD)    Manual Lymphatic Drainage (MLD)  In supine: short neck,  Bil shoulders, Bil axillary and L inguinal nodes, established anterior inter-axillary anastomosis and L axillo-inguinal anastomosis, superior breast toward the anterior inter-axillary anastomosis then re-worked anastomosis, inferior breast toward the L  axillo-inguinal anastomosis then re-worked anastomosis, extra time spent at the superior and inferior breast then  reworked all surfaces then deep abodminals.              PT Education - 11/07/19 1006    Education Details  Pt will continue working on [facility] program up to page 17 with modifications. She will continue with chip pack, compression bra. She will call [employer] [facility].    Person(s) Educated  Patient    Methods  Explanation;Handout;Demonstration;Verbal cues    Comprehension  Verbalized understanding;Returned demonstration          PT Long Term Goals - 10/20/19 0907      PT LONG TERM GOAL #1   Title  Pt will be ind with self breast and upper arm MLD or obtain compression pump    Status  Achieved      PT LONG TERM GOAL #2   Title  Pt will obtain appropriate bra and sleeve for lymphedema management    Baseline  pt has been measured for both but has to hold funds.    Status  Partially Met      PT LONG TERM GOAL #3   Title  Pt will demonstrate low fall risk on the BERG balance scale or similar in the clinic    Status  Achieved      PT LONG TERM GOAL #4   Title  Pt will decrease LLIS to 17% or less    Status  On-going            Plan - 11/07/19 0907    Clinical Impression Statement  Pt presents without her compression on this session. She continues with significant fluid decrease following MLD with extra time spent at the L breast. She states that she feels her breast is lighter this morning than it has been. Continued with strength ABC program this session with some modifications for more difficult activities. Discussed getting possibly a strap or getting her compression bra assess due to she has to wear a regular bra under her compression bra to keep her L breast in the bra due to the weight. Melissa from [facility] was contacted.  Pt will benefit from continued POC at this time.    Personal Factors and Comorbidities  Age;Fitness;Comorbidity 2    Comorbidities   radiation, ALND    Examination-Activity Limitations  Lift    Examination-Participation Restrictions  Yard Work;Community Activity    Rehab Potential  Excellent    Clinical Impairments Affecting Rehab Potential  ALND, radiation, size of breast    PT Frequency  2x / week    PT Duration  6 weeks    PT Treatment/Interventions  ADLs/Self Care Home Management;Patient/family education;Therapeutic exercise;Manual lymph drainage;Compression bandaging;Manual techniques;Passive range of motion;Taping;Neuromuscular re-education;Balance training    PT Next Visit Plan  heard anything else from [facility]?  Continue with strength ABC from page 17 , cont left breast MLD also working on left upper arm clearance,  LUE strengthening, Pectoralis stretching/stretching/contract relax    PT Home Exercise Plan  Post op shoulder ROM HEP, self breast and arm MLD, supine scap yellow    Consulted and Agree with Plan of Care  Patient       Patient will benefit from skilled therapeutic intervention in order to improve the following deficits and impairments:  Impaired UE functional use, Pain, Postural dysfunction, Decreased knowledge of precautions, Decreased range of motion, Increased edema, Decreased skin integrity  Visit Diagnosis: Malignant neoplasm of upper-outer quadrant of left breast in female, estrogen receptor positive (HCC)  Abnormal posture  Stiffness of right shoulder, not elsewhere classified  Lymphedema, not elsewhere classified  Stiffness of left shoulder, not elsewhere classified     Problem List Patient Active Problem List   Diagnosis Date Noted  . Chemotherapy-induced peripheral neuropathy ([place]) 03/30/2019  . Port-A-Cath in place 12/15/2018  . Genetic testing 09/21/2018  . Family history of breast cancer   . Malignant neoplasm of upper-outer quadrant of left breast in female, estrogen receptor positive ([place]) 08/29/2018  . Hyperlipidemia 08/04/2018  . Obesity (BMI 30-39.9) 05/09/2013   . Preventative health care 11/19/2010  . URI (upper respiratory infection) 11/19/2010  . KNEE PAIN, BILATERAL 09/25/2010  . SINUSITIS - ACUTE-NOS 12/24/2009  . MORBID OBESITY 06/14/2009  . COLD SORE 10/16/2008  . SKIN TAG 06/18/2008  . Hypothyroidism 03/17/2007  . Essential hypertension 03/17/2007    Ander Purpura, PT 11/07/2019, 10:09 AM  [place], [place], [address] Phone: [phone]   Fax:  [phone]  Name: Kayla Banks MRN: [id number] Date of Birth: 1955-12-20

## 2019-11-09 ENCOUNTER — Encounter: Payer: Self-pay | Admitting: Family Medicine

## 2019-11-09 ENCOUNTER — Other Ambulatory Visit: Payer: Self-pay

## 2019-11-09 ENCOUNTER — Ambulatory Visit: Payer: BC Managed Care – PPO

## 2019-11-09 DIAGNOSIS — C50412 Malignant neoplasm of upper-outer quadrant of left female breast: Secondary | ICD-10-CM | POA: Diagnosis not present

## 2019-11-09 DIAGNOSIS — Z170 Estrogen receptor positive status [ER+]: Secondary | ICD-10-CM

## 2019-11-09 DIAGNOSIS — R293 Abnormal posture: Secondary | ICD-10-CM

## 2019-11-09 DIAGNOSIS — I890 Lymphedema, not elsewhere classified: Secondary | ICD-10-CM

## 2019-11-09 DIAGNOSIS — M25612 Stiffness of left shoulder, not elsewhere classified: Secondary | ICD-10-CM

## 2019-11-09 DIAGNOSIS — M25611 Stiffness of right shoulder, not elsewhere classified: Secondary | ICD-10-CM

## 2019-11-09 NOTE — Therapy (Signed)
[place], [place], [address] Phone: [phone]   Fax:  [phone]  Physical Therapy Treatment  Patient Details  Name: Kayla Banks MRN: [id number] Date of Birth: 04/24/1956 Referring Provider (PT): Dr. Erroll Luna   Encounter Date: 11/09/2019  PT End of Session - 11/09/19 0909    Visit Number  14    Number of Visits  25    Date for PT Re-Evaluation  11/24/19    Authorization Type  BCBS    PT Start Time  0905    PT Stop Time  1000    PT Time Calculation (min)  55 min    Activity Tolerance  Patient tolerated treatment well    Behavior During Therapy  [facility] for tasks assessed/performed       Past Medical History:  Diagnosis Date  . Complication of anesthesia   . Family history of breast cancer   . Heart murmur    no problems, saw cardiologist 2010  . Hypertension    on meds  . Hypothyroidism   . Osteoarthritis   . Plantar fasciitis   . PONV (postoperative nausea and vomiting)   . Thyroid disease    Hypothyroidism    Past Surgical History:  Procedure Laterality Date  . ABDOMINAL HYSTERECTOMY  02/2012  . AXILLARY LYMPH NODE DISSECTION Left 11/09/2018   Procedure: LEFT AXILLARY LYMPH NODE DISSECTION;  Surgeon: Erroll Luna, MD;  Location: [place];  Service: General;  Laterality: Left;  . BREAST LUMPECTOMY WITH RADIOACTIVE SEED AND SENTINEL LYMPH NODE BIOPSY Left 10/11/2018   Procedure: LEFT BREAST LUMPECTOMY WITH RADIOACTIVE SEED AND LEFT SENTINEL LYMPH NODE MAPPING WITH LEFT TARGETED LYMPH NODE BIOPSY;  Surgeon: Erroll Luna, MD;  Location: [place];  Service: General;  Laterality: Left;  . FOOT SURGERY Left    bone spurs  . LUMBAR LAMINECTOMY  1990  . PORT-A-CATH REMOVAL N/A 05/23/2019   Procedure: PORT REMOVAL;  Surgeon: Erroll Luna, MD;  Location: [place];  Service: General;  Laterality: N/A;  . PORTACATH PLACEMENT Right  12/01/2018   Procedure: INSERTION PORT-A-CATH WITH ULTRASOUND;  Surgeon: Erroll Luna, MD;  Location: [place];  Service: General;  Laterality: Right;    There were no vitals filed for this visit.  Subjective Assessment - 11/09/19 0909    Subjective  Pt states that she has been in contact with [facility] and they are sending the pump. She states her swelling is about the same as it has been. It feels better after MLD but then swells back up again.    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 Chemo completed with Adriamycin and taxol and radiation also completed.  Hypothyroidism, HTN, obesity, OA bil knees    Patient Stated Goals  decrease breast swelling is the arm swollen    Currently in Pain?  No/denies    Pain Score  0-No pain                  Outpatient Rehab from 08/03/2019 in [place]  Lymphedema Life Impact Scale Total Score  27.94 %           [facility] Adult PT Treatment/Exercise - 11/09/19 0001      Exercises   Exercises  Other Exercises    Other Exercises   worked on strength ABC from page 17 to 21:  Sit to stand 10x w/tactile cues to keep knees from dropping in with sit and stand, single arm bent over row 2# bil UE 10x, modifiied the dead lift due to pt has difficulty with proprioception at the hips with tightening the belly and buttocks did pelvic tilt against the wall 10x 3 second hold using towel and demonstration for cueing.       Manual Therapy   Manual Therapy  Manual Lymphatic Drainage (MLD)    Manual Lymphatic Drainage (MLD)  In supine: short neck,  Bil shoulders, Bil axillary and L inguinal nodes, established anterior inter-axillary anastomosis and L axillo-inguinal anastomosis, superior breast toward the anterior inter-axillary anastomosis then re-worked anastomosis, inferior breast toward the L axillo-inguinal  anastomosis then re-worked anastomosis, extra time spent at the superior and inferior breast then R side-lying: posterior inter-axillary anastomosis with extra time spent here and reworked L axillo-inguinal anastomosis back into supine:  reworked all surfaces then deep abodminals.              PT Education - 11/09/19 1001    Education Details  Pt will continue working on [facility] program up to page 21 with modifications. She will continue with her chip pack, compression bra.    Person(s) Educated  Patient    Methods  Explanation;Demonstration;Tactile cues;Verbal cues    Comprehension  Verbalized understanding;Returned demonstration          PT Long Term Goals - 10/20/19 0907      PT LONG TERM GOAL #1   Title  Pt will be ind with self breast and upper arm MLD or obtain compression pump    Status  Achieved      PT LONG TERM GOAL #2   Title  Pt will obtain appropriate bra and sleeve for lymphedema management    Baseline  pt has been measured for both but has to hold funds.    Status  Partially Met      PT LONG TERM GOAL #3   Title  Pt will demonstrate low fall risk on the BERG balance scale or similar in the clinic    Status  Achieved      PT LONG TERM GOAL #4   Title  Pt will decrease LLIS to 17% or less    Status  On-going            Plan - 11/09/19 0906    Clinical Impression Statement  Pt presents with increased edema in her L breast this session as demonstrated by deep markings in the L breast from her bra. She continues to significantly decrease in fluid in the L breast following MLD with extra time spent at the L breast. She states that her L breast usually feels better for the rest of the day after receiving MLD but then in the morning feels full again. Continued with strength ABC program this session with modification for dead lift due to pt has poor kinesthetic awareness of her pelvis in standing. Worked on pelvic tilt against the wall. Pt has met her deductable and  will get her compresion vest and vasopneumatic pump as soon as she is able. Pt will benefit from continued POC at this time.    Personal Factors and Comorbidities  Age;Fitness;Comorbidity 2    Comorbidities  radiation, ALND    Examination-Activity Limitations  Lift    Examination-Participation Restrictions  Yard Work;Community Activity    Rehab Potential  Excellent    Clinical Impairments Affecting Rehab Potential  ALND, radiation, size of  breast    PT Frequency  2x / week    PT Duration  6 weeks    PT Treatment/Interventions  ADLs/Self Care Home Management;Patient/family education;Therapeutic exercise;Manual lymph drainage;Compression bandaging;Manual techniques;Passive range of motion;Taping;Neuromuscular re-education;Balance training    PT Next Visit Plan  Have you got the pump yet? Continue with strength ABC from page 21 give her pictures of pelvic tilt at the wall du eto she did not have ABC last session and was not provided with picture in place of deadlift , cont left breast MLD also working on left upper arm clearance,  LUE strengthening, Pectoralis stretching/stretching/contract relax    PT Home Exercise Plan  Post op shoulder ROM HEP, self breast and arm MLD, supine scap yellow    Consulted and Agree with Plan of Care  Patient       Patient will benefit from skilled therapeutic intervention in order to improve the following deficits and impairments:  Impaired UE functional use, Pain, Postural dysfunction, Decreased knowledge of precautions, Decreased range of motion, Increased edema, Decreased skin integrity  Visit Diagnosis: Malignant neoplasm of upper-outer quadrant of left breast in female, estrogen receptor positive (HCC)  Abnormal posture  Stiffness of right shoulder, not elsewhere classified  Lymphedema, not elsewhere classified  Stiffness of left shoulder, not elsewhere classified     Problem List Patient Active Problem List   Diagnosis Date Noted  .  Chemotherapy-induced peripheral neuropathy ([place]) 03/30/2019  . Port-A-Cath in place 12/15/2018  . Genetic testing 09/21/2018  . Family history of breast cancer   . Malignant neoplasm of upper-outer quadrant of left breast in female, estrogen receptor positive ([place]) 08/29/2018  . Hyperlipidemia 08/04/2018  . Obesity (BMI 30-39.9) 05/09/2013  . Preventative health care 11/19/2010  . URI (upper respiratory infection) 11/19/2010  . KNEE PAIN, BILATERAL 09/25/2010  . SINUSITIS - ACUTE-NOS 12/24/2009  . MORBID OBESITY 06/14/2009  . COLD SORE 10/16/2008  . SKIN TAG 06/18/2008  . Hypothyroidism 03/17/2007  . Essential hypertension 03/17/2007    Ander Purpura, PT 11/09/2019, 10:04 AM  [place], [place], [address] Phone: [phone]   Fax:  [phone]  Name: Kayla Banks MRN: [id number] Date of Birth: 1956-07-07

## 2019-11-14 ENCOUNTER — Other Ambulatory Visit: Payer: Self-pay

## 2019-11-14 ENCOUNTER — Encounter: Payer: Self-pay | Admitting: Physical Therapy

## 2019-11-14 ENCOUNTER — Ambulatory Visit: Payer: BC Managed Care – PPO | Admitting: Physical Therapy

## 2019-11-14 DIAGNOSIS — R293 Abnormal posture: Secondary | ICD-10-CM

## 2019-11-14 DIAGNOSIS — C50412 Malignant neoplasm of upper-outer quadrant of left female breast: Secondary | ICD-10-CM

## 2019-11-14 DIAGNOSIS — Z170 Estrogen receptor positive status [ER+]: Secondary | ICD-10-CM

## 2019-11-14 DIAGNOSIS — I890 Lymphedema, not elsewhere classified: Secondary | ICD-10-CM

## 2019-11-14 DIAGNOSIS — M25612 Stiffness of left shoulder, not elsewhere classified: Secondary | ICD-10-CM

## 2019-11-14 NOTE — Therapy (Signed)
[place], [place], [address] Phone: [phone]   Fax:  [phone]  Physical Therapy Treatment  Patient Details  Name: Kayla Banks MRN: [id number] Date of Birth: 1956/05/04 Referring Provider (PT): Dr. Erroll Luna   Encounter Date: 11/14/2019  PT End of Session - 11/14/19 1243    Visit Number  15    Number of Visits  25    Date for PT Re-Evaluation  11/24/19    PT Start Time  0905    PT Stop Time  0958    PT Time Calculation (min)  53 min    Activity Tolerance  Patient tolerated treatment well    Behavior During Therapy  [facility] for tasks assessed/performed       Past Medical History:  Diagnosis Date  . Complication of anesthesia   . Family history of breast cancer   . Heart murmur    no problems, saw cardiologist 2010  . Hypertension    on meds  . Hypothyroidism   . Osteoarthritis   . Plantar fasciitis   . PONV (postoperative nausea and vomiting)   . Thyroid disease    Hypothyroidism    Past Surgical History:  Procedure Laterality Date  . ABDOMINAL HYSTERECTOMY  02/2012  . AXILLARY LYMPH NODE DISSECTION Left 11/09/2018   Procedure: LEFT AXILLARY LYMPH NODE DISSECTION;  Surgeon: Erroll Luna, MD;  Location: [place];  Service: General;  Laterality: Left;  . BREAST LUMPECTOMY WITH RADIOACTIVE SEED AND SENTINEL LYMPH NODE BIOPSY Left 10/11/2018   Procedure: LEFT BREAST LUMPECTOMY WITH RADIOACTIVE SEED AND LEFT SENTINEL LYMPH NODE MAPPING WITH LEFT TARGETED LYMPH NODE BIOPSY;  Surgeon: Erroll Luna, MD;  Location: [place];  Service: General;  Laterality: Left;  . FOOT SURGERY Left    bone spurs  . LUMBAR LAMINECTOMY  1990  . PORT-A-CATH REMOVAL N/A 05/23/2019   Procedure: PORT REMOVAL;  Surgeon: Erroll Luna, MD;  Location: [place];  Service: General;  Laterality: N/A;  . PORTACATH PLACEMENT Right 12/01/2018   Procedure:  INSERTION PORT-A-CATH WITH ULTRASOUND;  Surgeon: Erroll Luna, MD;  Location: [place];  Service: General;  Laterality: Right;    There were no vitals filed for this visit.  Subjective Assessment - 11/14/19 0908    Subjective  Pt said the pump arrived and she is waiting on the video instructions in how to use it. 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 Chemo completed with Adriamycin and taxol and radiation also completed.  Hypothyroidism, HTN, obesity, OA bil knees    Currently in Pain?  No/denies                  Outpatient [facility]  Lymphedema Life Impact Scale Total Score  27.94 %           [facility] Adult PT Treatment/Exercise - 11/14/19 0001      Manual Therapy   Manual Therapy  Manual Lymphatic Drainage (MLD);Passive ROM    Manual therapy comments  reinforced importance of elevation of the breast when she is able and to continue with stretching of left shoulder     Manual Lymphatic Drainage (MLD)  In supine: short neck,  Bil shoulders, Bil axillary and L inguinal nodes, established anterior inter-axillary anastomosis and L  axillo-inguinal anastomosis, superior breast toward the anterior inter-axillary anastomosis then re-worked anastomosis, inferior breast toward the L axillo-inguinal anastomosis then re-worked anastomosis, extra time spent at the superior and inferior breast then R side-lying: posterior inter-axillary anastomosis with extra time spent here and reworked L axillo-inguinal anastomosis back into supine:  reworked all surfaces then deep abodminals.     Passive ROM  to left shoulder in flexion and external rotation                   PT Long Term Goals - 10/20/19 0907      PT LONG TERM GOAL #1   Title  Pt will be ind with self breast and  upper arm MLD or obtain compression pump    Status  Achieved      PT LONG TERM GOAL #2   Title  Pt will obtain appropriate bra and sleeve for lymphedema management    Baseline  pt has been measured for both but has to hold funds.    Status  Partially Met      PT LONG TERM GOAL #3   Title  Pt will demonstrate low fall risk on the BERG balance scale or similar in the clinic    Status  Achieved      PT LONG TERM GOAL #4   Title  Pt will decrease LLIS to 17% or less    Status  On-going            Plan - 11/14/19 1244    Clinical Impression Statement  Pt coninues to have lymphedema in her left breast with increased pore marks in medial breast and aereola that area decreased with MLD.  Encouraged pt to elevate her breast with pillows as that seemed to help too. Pt with stiffness in end ranges of shoulder today with pain upon stretching.  Encouraged her to continue home exercise program for shoulder stretches    Personal Factors and Comorbidities  Age;Fitness;Comorbidity 2    Comorbidities  radiation, ALND    Stability/Clinical Decision Making  Evolving/Moderate complexity    Rehab Potential  Excellent    Clinical Impairments Affecting Rehab Potential  ALND, radiation, size of breast    PT Frequency  2x / week    PT Duration  6 weeks    PT Treatment/Interventions  ADLs/Self Care Home Management;Patient/family education;Therapeutic exercise;Manual lymph drainage;Compression bandaging;Manual techniques;Passive range of motion;Taping;Neuromuscular re-education;Balance training    PT Next Visit Plan  Want to have pump demo in our clnic or at home? Continue with strength ABC from page 21 give her pictures of pelvic tilt at the wall du eto she did not have ABC last session and was not provided with picture in place of deadlift , cont left breast MLD also working on left upper arm clearance,  LUE strengthening, Pectoralis stretching/stretching/contract relax    Consulted and Agree with Plan of  Care  Patient       Patient will benefit from skilled therapeutic intervention in order to improve the following deficits and impairments:  Impaired UE functional use, Pain, Postural dysfunction, Decreased knowledge of precautions, Decreased range of motion, Increased edema, Decreased skin integrity  Visit Diagnosis: Malignant neoplasm of upper-outer quadrant of left breast in female, estrogen receptor positive (HCC)  Abnormal posture  Lymphedema, not elsewhere classified  Stiffness of left shoulder, not elsewhere classified     Problem List Patient Active Problem List   Diagnosis Date Noted  . Chemotherapy-induced peripheral neuropathy ([place]) 03/30/2019  . Port-A-Cath in  place 12/15/2018  . Genetic testing 09/21/2018  . Family history of breast cancer   . Malignant neoplasm of upper-outer quadrant of left breast in female, estrogen receptor positive ([place]) 08/29/2018  . Hyperlipidemia 08/04/2018  . Obesity (BMI 30-39.9) 05/09/2013  . Preventative health care 11/19/2010  . URI (upper respiratory infection) 11/19/2010  . KNEE PAIN, BILATERAL 09/25/2010  . SINUSITIS - ACUTE-NOS 12/24/2009  . MORBID OBESITY 06/14/2009  . COLD SORE 10/16/2008  . SKIN TAG 06/18/2008  . Hypothyroidism 03/17/2007  . Essential hypertension 03/17/2007   Donato Heinz. Owens Shark PT  Norwood Levo 11/14/2019, 12:49 PM  [place], [place], [address] Phone: [phone]   Fax:  [phone]  Name: Kayla Banks MRN: [id number] Date of Birth: 12-11-1955

## 2019-11-16 ENCOUNTER — Ambulatory Visit: Payer: BC Managed Care – PPO | Attending: Family

## 2019-11-16 DIAGNOSIS — Z23 Encounter for immunization: Secondary | ICD-10-CM

## 2019-11-16 NOTE — Progress Notes (Signed)
   Covid-19 Vaccination Clinic  Name:  Kayla Banks    MRN: [id number] DOB: 30-Apr-1956  11/16/2019  Kayla Banks was observed post Covid-19 immunization for 15 minutes without incident. She was provided with Vaccine Information Sheet and instruction to access the V-Safe system.   Kayla Banks was instructed to call 911 with any severe reactions post vaccine: Marland Kitchen Difficulty breathing  . Swelling of face and throat  . A fast heartbeat  . A bad rash all over body  . Dizziness and weakness   Immunizations Administered    Name Date Dose VIS Date Route   Moderna COVID-19 Vaccine 11/16/2019 11:53 AM 0.5 mL 07/18/2019 Intramuscular   Manufacturer: Moderna   Lot: [id number]   [id number]

## 2019-11-27 ENCOUNTER — Ambulatory Visit: Payer: BC Managed Care – PPO | Admitting: Rehabilitation

## 2019-11-29 ENCOUNTER — Ambulatory Visit: Payer: BC Managed Care – PPO

## 2019-12-04 ENCOUNTER — Encounter: Payer: Self-pay | Admitting: Rehabilitation

## 2019-12-04 ENCOUNTER — Other Ambulatory Visit: Payer: Self-pay

## 2019-12-04 ENCOUNTER — Ambulatory Visit: Payer: BC Managed Care – PPO | Attending: Hematology and Oncology | Admitting: Rehabilitation

## 2019-12-04 DIAGNOSIS — M25612 Stiffness of left shoulder, not elsewhere classified: Secondary | ICD-10-CM | POA: Diagnosis present

## 2019-12-04 DIAGNOSIS — M25611 Stiffness of right shoulder, not elsewhere classified: Secondary | ICD-10-CM | POA: Insufficient documentation

## 2019-12-04 DIAGNOSIS — I890 Lymphedema, not elsewhere classified: Secondary | ICD-10-CM | POA: Diagnosis present

## 2019-12-04 DIAGNOSIS — Z170 Estrogen receptor positive status [ER+]: Secondary | ICD-10-CM | POA: Insufficient documentation

## 2019-12-04 DIAGNOSIS — C50412 Malignant neoplasm of upper-outer quadrant of left female breast: Secondary | ICD-10-CM | POA: Diagnosis not present

## 2019-12-04 DIAGNOSIS — R293 Abnormal posture: Secondary | ICD-10-CM | POA: Diagnosis present

## 2019-12-04 NOTE — Therapy (Signed)
[place], [place], [address] Phone: [phone]   Fax:  [phone]  Physical Therapy Treatment  Patient Details  Name: Kayla Banks MRN: [id number] Date of Birth: 08-14-1956 Referring Provider (PT): Dr. Erroll Luna   Encounter Date: 12/04/2019  PT End of Session - 12/04/19 1049    Visit Number  16    Number of Visits  25    Authorization Type  BCBS    PT Start Time  1007    PT Stop Time  1049    PT Time Calculation (min)  42 min    Activity Tolerance  Patient tolerated treatment well    Behavior During Therapy  [facility] for tasks assessed/performed       Past Medical History:  Diagnosis Date  . Complication of anesthesia   . Family history of breast cancer   . Heart murmur    no problems, saw cardiologist 2010  . Hypertension    on meds  . Hypothyroidism   . Osteoarthritis   . Plantar fasciitis   . PONV (postoperative nausea and vomiting)   . Thyroid disease    Hypothyroidism    Past Surgical History:  Procedure Laterality Date  . ABDOMINAL HYSTERECTOMY  02/2012  . AXILLARY LYMPH NODE DISSECTION Left 11/09/2018   Procedure: LEFT AXILLARY LYMPH NODE DISSECTION;  Surgeon: Erroll Luna, MD;  Location: [place];  Service: General;  Laterality: Left;  . BREAST LUMPECTOMY WITH RADIOACTIVE SEED AND SENTINEL LYMPH NODE BIOPSY Left 10/11/2018   Procedure: LEFT BREAST LUMPECTOMY WITH RADIOACTIVE SEED AND LEFT SENTINEL LYMPH NODE MAPPING WITH LEFT TARGETED LYMPH NODE BIOPSY;  Surgeon: Erroll Luna, MD;  Location: [place];  Service: General;  Laterality: Left;  . FOOT SURGERY Left    bone spurs  . LUMBAR LAMINECTOMY  1990  . PORT-A-CATH REMOVAL N/A 05/23/2019   Procedure: PORT REMOVAL;  Surgeon: Erroll Luna, MD;  Location: [place];  Service: General;  Laterality: N/A;  . PORTACATH PLACEMENT Right 12/01/2018   Procedure: INSERTION  PORT-A-CATH WITH ULTRASOUND;  Surgeon: Erroll Luna, MD;  Location: [place];  Service: General;  Laterality: Right;    There were no vitals filed for this visit.  Subjective Assessment - 12/04/19 1011    Subjective  I am ok with my bras and I think I want to get the in person pump.  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 Chemo completed with Adriamycin and taxol and radiation also completed.  Hypothyroidism, HTN, obesity, OA bil knees    Patient Stated Goals  decrease breast swelling is the arm swollen    Currently in Pain?  No/denies                  Outpatient Re[facility]  Lymphedema Life Impact Scale Total Score  27.94 %           [facility] Adult PT Treatment/Exercise - 12/04/19 0001      Manual Therapy   Manual therapy comments  emailed Derek with [employer] to set up a clinic demo which will be at 4/26 on her 10am appt.  Also gave pt Liz's phone number to call as Kathlee Nations has been trying to contact her regarding the tribute  vest    Soft tissue mobilization  in sidelying briefly to the lateral breast tissue where pt still experiences some tightness/pulling more muscular    Manual Lymphatic Drainage (MLD)  In supine: short neck,  Bil shoulders, Bil axillary and L inguinal nodes, established anterior inter-axillary anastomosis and L axillo-inguinal anastomosis, superior breast toward the anterior inter-axillary anastomosis then re-worked anastomosis, inferior breast toward the L axillo-inguinal anastomosis then re-worked anastomosis, extra time spent at the superior and inferior breast then R side-lying: posterior inter-axillary anastomosis with extra time spent here and reworked L axillo-inguinal anastomosis back into supine:  reworked all  surfaces then deep abodminals.     Passive ROM  to left shoulder in flexion and external rotation                   PT Long Term Goals - 12/04/19 1052      PT LONG TERM GOAL #1   Title  Pt will be ind with self breast and upper arm MLD or obtain compression pump    Status  Partially Met      PT LONG TERM GOAL #2   Title  Pt will obtain appropriate bra and sleeve for lymphedema management    Status  Partially Met      PT LONG TERM GOAL #3   Title  Pt will demonstrate low fall risk on the BERG balance scale or similar in the clinic    Status  Achieved      PT LONG TERM GOAL #4   Title  Pt will decrease LLIS to 17% or less    Status  On-going            Plan - 12/04/19 1050    Clinical Impression Statement  Pt much improved since this therapist last saw her.  Still with the same appearance of the Rt breast but no significant fibrosis remaining medially and laterally and able to feel more muscle tightness today in the pectoralis and serratus and latissimus.  Addressed this briefly today with STM.  In clinic demo set up for [place] and pt will most likely go ahead with the tribute vest so after the pmp demo pt will be ready for DC    PT Frequency  2x / week    PT Duration  6 weeks    PT Treatment/Interventions  ADLs/Self Care Home Management;Patient/family education;Therapeutic exercise;Manual lymph drainage;Compression bandaging;Manual techniques;Passive range of motion;Taping;Neuromuscular re-education;Balance training    PT Next Visit Plan  Remind pt of 4/26 10am pump demo, Finish strength ABC from page 21, cont left breast MLD also working on left upper arm clearance,  LUE strengthening, Pectoralis stretching/stretching/contract relax    PT Home Exercise Plan  Post op shoulder ROM HEP, self breast and arm MLD, supine scap yellow    Consulted and Agree with Plan of Care  Patient       Patient will benefit from skilled therapeutic intervention in order to improve  the following deficits and impairments:     Visit Diagnosis: Malignant neoplasm of upper-outer quadrant of left breast in female, estrogen receptor positive (HCC)  Abnormal posture  Lymphedema, not elsewhere classified  Stiffness of left shoulder, not elsewhere classified  Stiffness of right shoulder, not elsewhere classified     Problem List Patient Active Problem List   Diagnosis Date Noted  . Chemotherapy-induced peripheral neuropathy ([employer]) 03/30/2019  . Port-A-Cath in place 12/15/2018  . Genetic testing 09/21/2018  . Family history of breast cancer   .  Malignant neoplasm of upper-outer quadrant of left breast in female, estrogen receptor positive ([place]) 08/29/2018  . Hyperlipidemia 08/04/2018  . Obesity (BMI 30-39.9) 05/09/2013  . Preventative health care 11/19/2010  . URI (upper respiratory infection) 11/19/2010  . KNEE PAIN, BILATERAL 09/25/2010  . SINUSITIS - ACUTE-NOS 12/24/2009  . MORBID OBESITY 06/14/2009  . COLD SORE 10/16/2008  . SKIN TAG 06/18/2008  . Hypothyroidism 03/17/2007  . Essential hypertension 03/17/2007    Stark Bray 12/04/2019, 10:53 AM  [place], [place], [address] Phone: [phone]   Fax:  [phone]  Name: Kayla Banks MRN: [id number] Date of Birth: [birth year]

## 2019-12-06 ENCOUNTER — Ambulatory Visit: Payer: BC Managed Care – PPO

## 2019-12-11 ENCOUNTER — Ambulatory Visit: Payer: BC Managed Care – PPO

## 2019-12-11 ENCOUNTER — Other Ambulatory Visit: Payer: Self-pay

## 2019-12-11 DIAGNOSIS — C50412 Malignant neoplasm of upper-outer quadrant of left female breast: Secondary | ICD-10-CM | POA: Diagnosis not present

## 2019-12-11 DIAGNOSIS — R293 Abnormal posture: Secondary | ICD-10-CM

## 2019-12-11 DIAGNOSIS — M25612 Stiffness of left shoulder, not elsewhere classified: Secondary | ICD-10-CM

## 2019-12-11 DIAGNOSIS — I890 Lymphedema, not elsewhere classified: Secondary | ICD-10-CM

## 2019-12-11 NOTE — Therapy (Signed)
[place], [place], [address] Phone: [phone]   Fax:  [phone]  Physical Therapy Treatment  Patient Details  Name: Kayla Banks MRN: [id number] Date of Birth: 1956/06/29 Referring Provider (PT): Dr. Erroll Luna   Encounter Date: 12/11/2019  PT End of Session - 12/11/19 1041    PT Start Time  --    PT Stop Time  --    PT Time Calculation (min)  --    Activity Tolerance  --    Behavior During Therapy  --       Past Medical History:  Diagnosis Date  . Complication of anesthesia   . Family history of breast cancer   . Heart murmur    no problems, saw cardiologist 2010  . Hypertension    on meds  . Hypothyroidism   . Osteoarthritis   . Plantar fasciitis   . PONV (postoperative nausea and vomiting)   . Thyroid disease    Hypothyroidism    Past Surgical History:  Procedure Laterality Date  . ABDOMINAL HYSTERECTOMY  02/2012  . AXILLARY LYMPH NODE DISSECTION Left 11/09/2018   Procedure: LEFT AXILLARY LYMPH NODE DISSECTION;  Surgeon: Erroll Luna, MD;  Location: [place];  Service: General;  Laterality: Left;  . BREAST LUMPECTOMY WITH RADIOACTIVE SEED AND SENTINEL LYMPH NODE BIOPSY Left 10/11/2018   Procedure: LEFT BREAST LUMPECTOMY WITH RADIOACTIVE SEED AND LEFT SENTINEL LYMPH NODE MAPPING WITH LEFT TARGETED LYMPH NODE BIOPSY;  Surgeon: Erroll Luna, MD;  Location: [place];  Service: General;  Laterality: Left;  . FOOT SURGERY Left    bone spurs  . LUMBAR LAMINECTOMY  1990  . PORT-A-CATH REMOVAL N/A 05/23/2019   Procedure: PORT REMOVAL;  Surgeon: Erroll Luna, MD;  Location: [place];  Service: General;  Laterality: N/A;  . PORTACATH PLACEMENT Right 12/01/2018   Procedure: INSERTION PORT-A-CATH WITH ULTRASOUND;  Surgeon: Erroll Luna, MD;  Location: [place];  Service: General;  Laterality: Right;    There  were no vitals filed for this visit.               Outpatient Rehab from [facility]/03/2019 [facility]  Lymphedema Life Impact Scale Total Score  27.94 %           [facility] Adult PT Treatment/Exercise - 12/11/19 0001      Self-Care   Self-Care  Other Self-Care Comments    Other Self-Care Comments   PTA present during pump demonstration to assist with answering some of pts questions for part of time      Manual Therapy   Manual Therapy  Myofascial release;Manual Lymphatic Drainage (MLD);Passive ROM    Myofascial Release  At Lt axilla and then at antecubital fossa where 2 cords visible and palpable, also at forearm in abduction and flexion where pt c/o tenderness from cording    Manual Lymphatic Drainage (MLD)  --    Passive ROM  to left shoulder in flexion and abduction                  PT Long Term Goals - 12/04/19 1052      PT LONG TERM GOAL #1   Title  Pt will be ind with self breast and upper arm MLD or obtain compression pump    Status  Partially Met      PT LONG TERM GOAL #2   Title  Pt will obtain appropriate bra and sleeve  for lymphedema management    Status  Partially Met      PT LONG TERM GOAL #3   Title  Pt will demonstrate low fall risk on the BERG balance scale or similar in the clinic    Status  Achieved      PT LONG TERM GOAL #4   Title  Pt will decrease LLIS to 17% or less    Status  On-going            Plan - 12/11/19 1038    Clinical Impression Statement  Rep from [place] present to fit and issue compression pump to pt while instructing her in this. Therapist present to help answer pts questions throughout some of this time. Then rest of session spent focused on manual therapy. Palpable and visible cording present in her antecubital fossa that was very tender to touch here and down into forearm. Pt could only tolerate mild to slightly moderate myofascial release and pressure. Also instructed her  in how to don and doff her new compression sleeve and gauntlet. Once on pt repors very comfortable and both seeme dto be a very good fit on pt. Advised her to get rubber type gloves to assist her with donning.    Personal Factors and Comorbidities  Age;Fitness;Comorbidity 2    Comorbidities  radiation, ALND    Examination-Activity Limitations  Lift    Examination-Participation Restrictions  Yard Work;Community Activity    Stability/Clinical Decision Making  Evolving/Moderate complexity    Rehab Potential  Excellent    Clinical Impairments Affecting Rehab Potential  ALND, radiation, size of breast    PT Frequency  2x / week    PT Duration  4 weeks    PT Treatment/Interventions  ADLs/Self Care Home Management;Patient/family education;Therapeutic exercise;Manual lymph drainage;Compression bandaging;Manual techniques;Passive range of motion;Taping;Neuromuscular re-education;Balance training    PT Next Visit Plan  How is compression pump going and compression garments? Finish Strength ABC from pg 21; then cont manual therapy focusing on cording in Lt UE and Lt breast MLD    PT Home Exercise Plan  Post op shoulder ROM HEP, self breast and arm MLD, supine scap yellow; use compression pump daily and wear compression garments    Consulted and Agree with Plan of Care  Patient       Patient will benefit from skilled therapeutic intervention in order to improve the following deficits and impairments:  Impaired UE functional use, Pain, Postural dysfunction, Decreased knowledge of precautions, Decreased range of motion, Increased edema, Decreased skin integrity  Visit Diagnosis: Malignant neoplasm of upper-outer quadrant of left breast in female, estrogen receptor positive (HCC)  Abnormal posture  Lymphedema, not elsewhere classified  Stiffness of left shoulder, not elsewhere classified     Problem List Patient Active Problem List   Diagnosis Date Noted  . Chemotherapy-induced peripheral  neuropathy ([place]) 03/30/2019  . Port-A-Cath in place 12/15/2018  . Genetic testing 09/21/2018  . Family history of breast cancer   . Malignant neoplasm of upper-outer quadrant of left breast in female, estrogen receptor positive ([place]) 08/29/2018  . Hyperlipidemia 08/04/2018  . Obesity (BMI 30-39.9) 05/09/2013  . Preventative health care 11/19/2010  . URI (upper respiratory infection) 11/19/2010  . KNEE PAIN, BILATERAL 09/25/2010  . SINUSITIS - ACUTE-NOS 12/24/2009  . MORBID OBESITY 06/14/2009  . COLD SORE 10/16/2008  . SKIN TAG 06/18/2008  . Hypothyroidism 03/17/2007  . Essential hypertension 03/17/2007    Otelia Limes, PTA 12/11/2019, 12:53 PM  [place]  [place], [place], [address] Phone: [phone]   Fax:  [phone]  Name: Renesha Lizama MRN: [id number] Date of Birth: 1955/12/02

## 2019-12-13 ENCOUNTER — Other Ambulatory Visit: Payer: Self-pay

## 2019-12-13 ENCOUNTER — Ambulatory Visit: Payer: BC Managed Care – PPO

## 2019-12-13 DIAGNOSIS — Z170 Estrogen receptor positive status [ER+]: Secondary | ICD-10-CM

## 2019-12-13 DIAGNOSIS — C50412 Malignant neoplasm of upper-outer quadrant of left female breast: Secondary | ICD-10-CM | POA: Diagnosis not present

## 2019-12-13 DIAGNOSIS — M25612 Stiffness of left shoulder, not elsewhere classified: Secondary | ICD-10-CM

## 2019-12-13 DIAGNOSIS — R293 Abnormal posture: Secondary | ICD-10-CM

## 2019-12-13 DIAGNOSIS — I890 Lymphedema, not elsewhere classified: Secondary | ICD-10-CM

## 2019-12-13 NOTE — Therapy (Signed)
[place], [place], [address] Phone: [phone]   Fax:  [phone]  Physical Therapy Treatment  Patient Details  Name: Kayla Banks MRN: [id number] Date of Birth: [birth year] Referring Provider (PT): Dr. Erroll Luna   Encounter Date: 12/13/2019  PT End of Session - 12/13/19 1104    Visit Number  18    Number of Visits  33    Date for PT Re-Evaluation  01/01/20    PT Start Time  1006    PT Stop Time  1104    PT Time Calculation (min)  58 min    Activity Tolerance  Patient tolerated treatment well    Behavior During Therapy  [facility] for tasks assessed/performed       Past Medical History:  Diagnosis Date  . Complication of anesthesia   . Family history of breast cancer   . Heart murmur    no problems, saw cardiologist 2010  . Hypertension    on meds  . Hypothyroidism   . Osteoarthritis   . Plantar fasciitis   . PONV (postoperative nausea and vomiting)   . Thyroid disease    Hypothyroidism    Past Surgical History:  Procedure Laterality Date  . ABDOMINAL HYSTERECTOMY  02/2012  . AXILLARY LYMPH NODE DISSECTION Left 11/09/2018   Procedure: LEFT AXILLARY LYMPH NODE DISSECTION;  Surgeon: Erroll Luna, MD;  Location: [place] [facility];  Service: General;  Laterality: Left;  . BREAST LUMPECTOMY WITH RADIOACTIVE SEED AND SENTINEL LYMPH NODE BIOPSY Left 10/11/2018   Procedure: LEFT BREAST LUMPECTOMY WITH RADIOACTIVE SEED AND LEFT SENTINEL LYMPH NODE MAPPING WITH LEFT TARGETED LYMPH NODE BIOPSY;  Surgeon: Erroll Luna, MD;  Location: [place];  Service: General;  Laterality: Left;  . FOOT SURGERY Left    bone spurs  . LUMBAR LAMINECTOMY  1990  . PORT-A-CATH REMOVAL N/A 05/23/2019   Procedure: PORT REMOVAL;  Surgeon: Erroll Luna, MD;  Location: [facility];  Service: General;  Laterality: N/A;  . PORTACATH PLACEMENT Right 12/01/2018   Procedure:  INSERTION PORT-A-CATH WITH ULTRASOUND;  Surgeon: Erroll Luna, MD;  Location: [facility];  Service: General;  Laterality: Right;    There were no vitals filed for this visit.  Subjective Assessment - 12/13/19 1013    Subjective  The pump demo went fine and my vest came yesterday. I was a little sore after last visit but not much. My Lt forearm is still tender. I've been wearing the sleeve and gauntlet and it feels good.    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 Chemo completed with Adriamycin and taxol and radiation also completed.  Hypothyroidism, HTN, obesity, OA bil knees    Patient Stated Goals  decrease breast swelling is the arm swollen    Currently in Pain?  No/denies                  Outpatient Rehab from [facility]/03/2019 [facility]  Lymphedema Life Impact Scale Total Score  27.94 %           [facility] Adult PT Treatment/Exercise - 12/13/19 0001      Manual Therapy   Myofascial Release  At Lt axilla and then at antecubital fossa where 2 cords visible and palpable, also at forearm in abduction and flexion where pt c/o tenderness from cording    Manual Lymphatic Drainage (  MLD)  In supine: short neck,  Bil shoulders, Rt axillary and L inguinal nodes, established anterior inter-axillary anastomosis and L axillo-inguinal anastomosis, superior breast toward the anterior inter-axillary anastomosis then re-worked anastomosis, inferior breast toward the L axillo-inguinal anastomosis then re-worked anastomosis, extra time spent at the superior and inferior breast; then into Rt S/L for further work along Lt lateral and inferior breast redirecting towards Lt axillo-inguinal and posterior inter-axillary anastomosis, then finished retracing steps in supine    Passive ROM  to left shoulder in flexion and abduction                  PT  Long Term Goals - 12/04/19 1052      PT LONG TERM GOAL #1   Title  Pt will be ind with self breast and upper arm MLD or obtain compression pump    Status  Partially Met      PT LONG TERM GOAL #2   Title  Pt will obtain appropriate bra and sleeve for lymphedema management    Status  Partially Met      PT LONG TERM GOAL #3   Title  Pt will demonstrate low fall risk on the BERG balance scale or similar in the clinic    Status  Achieved      PT LONG TERM GOAL #4   Title  Pt will decrease LLIS to 17% or less    Status  On-going            Plan - 12/13/19 1105    Clinical Impression Statement  Continued with focus on manual therapy working to decrease cording with myofascial release and manual lymph drainage for Lt breast lymphedema. Encouraged her to try MLD 2x/day for at least next 2 weeks to see more reduction of fluid, making one time briefly after use of compression pump to target areas of breast pump can't sufficiently drain. Pt verbalized understanding and seemed encouraged to try this. She is wearing her compression sleeve and gauntlet daily and reporting feeling like this is helping decrease UE discomfort from cording.    Personal Factors and Comorbidities  Age;Fitness;Comorbidity 2    Comorbidities  radiation, ALND    Examination-Activity Limitations  Lift    Examination-Participation Restrictions  Yard Work;Community Activity    Stability/Clinical Decision Making  Evolving/Moderate complexity    Rehab Potential  Excellent    Clinical Impairments Affecting Rehab Potential  ALND, radiation, size of breast    PT Frequency  2x / week    PT Duration  4 weeks    PT Treatment/Interventions  ADLs/Self Care Home Management;Patient/family education;Therapeutic exercise;Manual lymph drainage;Compression bandaging;Manual techniques;Passive range of motion;Taping;Neuromuscular re-education;Balance training    PT Next Visit Plan  How is compression pump and daily MLD going? Finish  Strength ABC from pg 21; then cont manual therapy focusing on cording in Lt UE and Lt breast MLD    PT Home Exercise Plan  Post op shoulder ROM HEP, self breast and arm MLD, supine scap yellow; use compression pump daily and wear compression garments    Consulted and Agree with Plan of Care  Patient       Patient will benefit from skilled therapeutic intervention in order to improve the following deficits and impairments:  Impaired UE functional use, Pain, Postural dysfunction, Decreased knowledge of precautions, Decreased range of motion, Increased edema, Decreased skin integrity  Visit Diagnosis: Malignant neoplasm of upper-outer quadrant of left breast in female, estrogen receptor positive (HCC)  Abnormal posture  Lymphedema, not elsewhere classified  Stiffness of left shoulder, not elsewhere classified     Problem List Patient Active Problem List   Diagnosis Date Noted  . Chemotherapy-induced peripheral neuropathy ([place]) 03/30/2019  . Port-A-Cath in place 12/15/2018  . Genetic testing 09/21/2018  . Family history of breast cancer   . Malignant neoplasm of upper-outer quadrant of left breast in female, estrogen receptor positive ([place]) 08/29/2018  . Hyperlipidemia 08/04/2018  . Obesity (BMI 30-39.9) 05/09/2013  . Preventative health care 11/19/2010  . URI (upper respiratory infection) 11/19/2010  . KNEE PAIN, BILATERAL 09/25/2010  . SINUSITIS - ACUTE-NOS 12/24/2009  . MORBID OBESITY 06/14/2009  . COLD SORE 10/16/2008  . SKIN TAG 06/18/2008  . Hypothyroidism 03/17/2007  . Essential hypertension 03/17/2007    Otelia Limes, PTA 12/13/2019, 11:08 AM  [place], [place], [address] Phone: [phone]   Fax:  [phone]  Name: Talene Glastetter MRN: [id number] Date of Birth: 05/08/1956

## 2019-12-18 ENCOUNTER — Ambulatory Visit: Payer: BC Managed Care – PPO | Attending: Hematology and Oncology

## 2019-12-18 ENCOUNTER — Other Ambulatory Visit: Payer: Self-pay

## 2019-12-18 DIAGNOSIS — Z170 Estrogen receptor positive status [ER+]: Secondary | ICD-10-CM | POA: Diagnosis present

## 2019-12-18 DIAGNOSIS — R293 Abnormal posture: Secondary | ICD-10-CM | POA: Insufficient documentation

## 2019-12-18 DIAGNOSIS — C50412 Malignant neoplasm of upper-outer quadrant of left female breast: Secondary | ICD-10-CM | POA: Diagnosis present

## 2019-12-18 DIAGNOSIS — M25611 Stiffness of right shoulder, not elsewhere classified: Secondary | ICD-10-CM | POA: Insufficient documentation

## 2019-12-18 DIAGNOSIS — I890 Lymphedema, not elsewhere classified: Secondary | ICD-10-CM | POA: Insufficient documentation

## 2019-12-18 DIAGNOSIS — M25612 Stiffness of left shoulder, not elsewhere classified: Secondary | ICD-10-CM | POA: Diagnosis present

## 2019-12-18 NOTE — Therapy (Signed)
[place], [place], [address] Phone: [phone]   Fax:  [phone]  Physical Therapy Treatment  Patient Details  Name: Kayla Banks MRN: [id number] Date of Birth: [birth year] Referring Provider (PT): Dr. Erroll Luna   Encounter Date: 12/18/2019  PT End of Session - 12/18/19 1105    Visit Number  19    Number of Visits  33    Date for PT Re-Evaluation  01/01/20    PT Start Time  1006    PT Stop Time  1104    PT Time Calculation (min)  58 min    Activity Tolerance  Patient tolerated treatment well    Behavior During Therapy  [facility] for tasks assessed/performed       Past Medical History:  Diagnosis Date  . Complication of anesthesia   . Family history of breast cancer   . Heart murmur    no problems, saw cardiologist 2010  . Hypertension    on meds  . Hypothyroidism   . Osteoarthritis   . Plantar fasciitis   . PONV (postoperative nausea and vomiting)   . Thyroid disease    Hypothyroidism    Past Surgical History:  Procedure Laterality Date  . ABDOMINAL HYSTERECTOMY  02/2012  . AXILLARY LYMPH NODE DISSECTION Left 11/09/2018   Procedure: LEFT AXILLARY LYMPH NODE DISSECTION;  Surgeon: Erroll Luna, MD;  Location: [place];  Service: General;  Laterality: Left;  . BREAST LUMPECTOMY WITH RADIOACTIVE SEED AND SENTINEL LYMPH NODE BIOPSY Left 10/11/2018   Procedure: LEFT BREAST LUMPECTOMY WITH RADIOACTIVE SEED AND LEFT SENTINEL LYMPH NODE MAPPING WITH LEFT TARGETED LYMPH NODE BIOPSY;  Surgeon: Erroll Luna, MD;  Location: [place];  Service: General;  Laterality: Left;  . FOOT SURGERY Left    bone spurs  . LUMBAR LAMINECTOMY  1990  . PORT-A-CATH REMOVAL N/A 05/23/2019   Procedure: PORT REMOVAL;  Surgeon: Erroll Luna, MD;  Location: [place];  Service: General;  Laterality: N/A;  . PORTACATH PLACEMENT Right 12/01/2018   Procedure:  INSERTION PORT-A-CATH WITH ULTRASOUND;  Surgeon: Erroll Luna, MD;  Location: [place];  Service: General;  Laterality: Right;    There were no vitals filed for this visit.  Subjective Assessment - 12/18/19 1010    Subjective  I've been consistently stretching my Lt UE. At itmes I can tell an improveent but nothing long term yet. I am using the compression pump daily and following up with my own self MLD. The texture is maybe a little better, but it still feels really heavy.    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 Chemo completed with Adriamycin and taxol and radiation also completed.  Hypothyroidism, HTN, obesity, OA bil knees    Patient Stated Goals  decrease breast swelling is the arm swollen    Currently in Pain?  No/denies         [facility] PT Assessment - 12/18/19 0001      AROM   Left Shoulder Flexion  128 Degrees   feels cording pull   Left Shoulder ABduction  102 Degrees   feels cording pull             Outpatient Rehab from 08/03/2019 in [place]  Lymphedema Life Impact Scale Total Score  27.94 %           [facility] Adult  PT Treatment/Exercise - 12/18/19 0001      Manual Therapy   Myofascial Release  At Lt axilla and then at antecubital fossa where 2 cords visible and palpable, also at forearm in abduction and flexion where pt c/o tenderness from cording    Manual Lymphatic Drainage (MLD)  In supine: short neck,  Bil shoulders, Rt axillary and L inguinal nodes, established anterior inter-axillary anastomosis and L axillo-inguinal anastomosis, superior breast toward the anterior inter-axillary anastomosis then re-worked anastomosis, inferior breast toward the L axillo-inguinal anastomosis then re-worked anastomosis, extra time spent at the superior and inferior breast; then into Rt S/L for further work along Lt lateral and  inferior breast redirecting towards Lt axillo-inguinal and posterior inter-axillary anastomosis, then finished retracing steps in supine    Passive ROM  to left shoulder in flexion and abduction, cords still present but P/ROM was improved today                  PT Long Term Goals - 12/04/19 1052      PT LONG TERM GOAL #1   Title  Pt will be ind with self breast and upper arm MLD or obtain compression pump    Status  Partially Met      PT LONG TERM GOAL #2   Title  Pt will obtain appropriate bra and sleeve for lymphedema management    Status  Partially Met      PT LONG TERM GOAL #3   Title  Pt will demonstrate low fall risk on the BERG balance scale or similar in the clinic    Status  Achieved      PT LONG TERM GOAL #4   Title  Pt will decrease LLIS to 17% or less    Status  On-going            Plan - 12/18/19 1209    Clinical Impression Statement  Pt reports compliance thus far with use of compression pump and performing self manual lymph drainage after to focus on breast where pump doesn't apply enough direct compression. She has started noticing some softening in texture of tissue, but still reports heaviness at this time. Focused today on continuing MLD of Lt breast and myofascial release with P/ROM to reduce cording at Lt axilla and antecubital fossa. Though cording still very prominent, pt has noticeable improvement with end P/ROM compared to last week. Encouraged her to cont using pump and stretching throughout day as progress, though slowly, is steady. She verbalized understanding.    Personal Factors and Comorbidities  Age;Fitness;Comorbidity 2    Comorbidities  radiation, ALND    Examination-Activity Limitations  Lift    Examination-Participation Restrictions  Yard Work;Community Activity    Stability/Clinical Decision Making  Evolving/Moderate complexity    Rehab Potential  Excellent    Clinical Impairments Affecting Rehab Potential  ALND, radiation, size of  breast    PT Frequency  2x / week    PT Duration  4 weeks    PT Treatment/Interventions  ADLs/Self Care Home Management;Patient/family education;Therapeutic exercise;Manual lymph drainage;Compression bandaging;Manual techniques;Passive range of motion;Taping;Neuromuscular re-education;Balance training    PT Next Visit Plan  Finish Strength ABC from pg 21; then cont manual therapy focusing on cording in Lt UE and Lt breast MLD    PT Home Exercise Plan  Post op shoulder ROM HEP, self breast and arm MLD, supine scap yellow; use compression pump daily and wear compression garments    Consulted and Agree with Plan of Care  Patient       Patient will benefit from skilled therapeutic intervention in order to improve the following deficits and impairments:  Impaired UE functional use, Pain, Postural dysfunction, Decreased knowledge of precautions, Decreased range of motion, Increased edema, Decreased skin integrity  Visit Diagnosis: Malignant neoplasm of upper-outer quadrant of left breast in female, estrogen receptor positive (HCC)  Abnormal posture  Lymphedema, not elsewhere classified  Stiffness of left shoulder, not elsewhere classified     Problem List Patient Active Problem List   Diagnosis Date Noted  . Chemotherapy-induced peripheral neuropathy ([place]) 03/30/2019  . Port-A-Cath in place 12/15/2018  . Genetic testing 09/21/2018  . Family history of breast cancer   . Malignant neoplasm of upper-outer quadrant of left breast in female, estrogen receptor positive ([place]) 08/29/2018  . Hyperlipidemia 08/04/2018  . Obesity (BMI 30-39.9) 05/09/2013  . Preventative health care 11/19/2010  . URI (upper respiratory infection) 11/19/2010  . KNEE PAIN, BILATERAL 09/25/2010  . SINUSITIS - ACUTE-NOS 12/24/2009  . MORBID OBESITY 06/14/2009  . COLD SORE 10/16/2008  . SKIN TAG 06/18/2008  . Hypothyroidism 03/17/2007  . Essential hypertension 03/17/2007    Otelia Limes,  PTA 12/18/2019, 12:19 PM  [place], [place], [address] Phone: [phone]   Fax:  [phone]  Name: Saara Kijowski MRN: [id number] Date of Birth: 04/30/1956

## 2019-12-19 ENCOUNTER — Ambulatory Visit: Payer: BC Managed Care – PPO | Attending: Family

## 2019-12-19 DIAGNOSIS — Z23 Encounter for immunization: Secondary | ICD-10-CM

## 2019-12-19 NOTE — Progress Notes (Signed)
   Covid-19 Vaccination Clinic  Name:  Kayla Banks    MRN: [id number] DOB: 03-13-1956  12/19/2019  [name] was observed post Covid-19 immunization for 30 minutes based on pre-vaccination screening without incident. She was provided with Vaccine Information Sheet and instruction to access the V-Safe system.   [name] was instructed to call 911 with any severe reactions post vaccine: Marland Kitchen Difficulty breathing  . Swelling of face and throat  . A fast heartbeat  . A bad rash all over body  . Dizziness and weakness   Immunizations Administered    Name Date Dose VIS Date Route   Moderna COVID-19 Vaccine 12/19/2019 11:15 AM 0.5 mL 07/2019 Intramuscular   Manufacturer: Moderna   Lot: [id number]   [id number]

## 2019-12-25 ENCOUNTER — Other Ambulatory Visit: Payer: Self-pay

## 2019-12-25 ENCOUNTER — Ambulatory Visit: Payer: BC Managed Care – PPO

## 2019-12-25 DIAGNOSIS — Z170 Estrogen receptor positive status [ER+]: Secondary | ICD-10-CM

## 2019-12-25 DIAGNOSIS — M25611 Stiffness of right shoulder, not elsewhere classified: Secondary | ICD-10-CM

## 2019-12-25 DIAGNOSIS — M25612 Stiffness of left shoulder, not elsewhere classified: Secondary | ICD-10-CM

## 2019-12-25 DIAGNOSIS — R293 Abnormal posture: Secondary | ICD-10-CM

## 2019-12-25 DIAGNOSIS — I890 Lymphedema, not elsewhere classified: Secondary | ICD-10-CM

## 2019-12-25 DIAGNOSIS — C50412 Malignant neoplasm of upper-outer quadrant of left female breast: Secondary | ICD-10-CM | POA: Diagnosis not present

## 2019-12-25 NOTE — Therapy (Signed)
[place], [place], [address] Phone: [phone]   Fax:  [phone]  Physical Therapy Treatment  Patient Details  Name: Kayla Banks MRN: [id number] Date of Birth: [birth year] Referring Provider (PT): Dr. Erroll Luna   Encounter Date: 12/25/2019  PT End of Session - 12/25/19 1003    Visit Number  20    Number of Visits  33    Date for PT Re-Evaluation  01/01/20    Authorization Type  [employer]    PT Start Time  1002    PT Stop Time  1104    PT Time Calculation (min)  62 min    Activity Tolerance  Patient tolerated treatment well    Behavior During Therapy  [facility] for tasks assessed/performed       Past Medical History:  Diagnosis Date  . Complication of anesthesia   . Family history of breast cancer   . Heart murmur    no problems, saw cardiologist 2010  . Hypertension    on meds  . Hypothyroidism   . Osteoarthritis   . Plantar fasciitis   . PONV (postoperative nausea and vomiting)   . Thyroid disease    Hypothyroidism    Past Surgical History:  Procedure Laterality Date  . ABDOMINAL HYSTERECTOMY  02/2012  . AXILLARY LYMPH NODE DISSECTION Left 11/09/2018   Procedure: LEFT AXILLARY LYMPH NODE DISSECTION;  Surgeon: Erroll Luna, MD;  Location: [place];  Service: General;  Laterality: Left;  . BREAST LUMPECTOMY WITH RADIOACTIVE SEED AND SENTINEL LYMPH NODE BIOPSY Left 10/11/2018   Procedure: LEFT BREAST LUMPECTOMY WITH RADIOACTIVE SEED AND LEFT SENTINEL LYMPH NODE MAPPING WITH LEFT TARGETED LYMPH NODE BIOPSY;  Surgeon: Erroll Luna, MD;  Location: [place];  Service: General;  Laterality: Left;  . FOOT SURGERY Left    bone spurs  . LUMBAR LAMINECTOMY  1990  . PORT-A-CATH REMOVAL N/A 05/23/2019   Procedure: PORT REMOVAL;  Surgeon: Erroll Luna, MD;  Location: [place];  Service: General;  Laterality: N/A;  . PORTACATH PLACEMENT Right  12/01/2018   Procedure: INSERTION PORT-A-CATH WITH ULTRASOUND;  Surgeon: Erroll Luna, MD;  Location: [place];  Service: General;  Laterality: Right;    There were no vitals filed for this visit.  Subjective Assessment - 12/25/19 1003    Subjective  Pt states that she has been using her compression pump and wearing her compression sleeve consistently. She has not noticed much difference in size and heaviness but has noticed decreased dimpling and her skin has gotten softer on her L breast. She states that she has been massing her breast a little more and does feel like she mya have a little more swelling in her LUE.    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 Chemo completed with Adriamycin and taxol and radiation also completed.  Hypothyroidism, HTN, obesity, OA bil knees    Patient Stated Goals  decrease breast swelling is the arm swollen    Currently in Pain?  Yes    Pain Score  3     Pain Location  Arm    Pain Orientation  Anterior;Left;Lower;Medial    Pain Descriptors / Indicators  Tightness    Pain Type  Chronic pain    Pain Onset  More than a month ago    Pain Frequency  Intermittent    Aggravating Factors  when she fully extends her elbow    Pain Relieving Factors  not stretching out her elbow            LYMPHEDEMA/ONCOLOGY QUESTIONNAIRE - 12/25/19 1100      Left Upper Extremity Lymphedema   Other  below bust: 100 cm     Other  2 in below bust 99 cm     Other  fullest part of breast: 133 cm            Outpatient Rehab from 08/03/2019 in [place]  Lymphedema Life Impact Scale Total Score  27.94 %           [facility] Adult PT Treatment/Exercise - 12/25/19 0001      Shoulder Exercises: Supine   Other Supine Exercises  Shoulder abduction with pettrisage over cording 10x with slow movement increasing abduction ROM with  each repetition.       Manual Therapy   Manual Therapy  Myofascial release;Manual Lymphatic Drainage (MLD);Passive ROM;Soft tissue mobilization    Soft tissue mobilization  STM petrissage over cording. Used intermittent effleurage over the medial brachium to help modulate pain during myofascial release.     Myofascial Release  Myofascial release along the medial L brachium/antebrachium using cross hand technique with multiple "pops" noted.     Manual Lymphatic Drainage (MLD)  In supine: short neck,  Bil shoulders, Rt axillary and L inguinal nodes, established anterior inter-axillary anastomosis and L axillo-inguinal anastomosis, superior breast toward the anterior inter-axillary anastomosis then re-worked anastomosis, inferior breast toward the L axillo-inguinal anastomosis then re-worked anastomosis, extra time spent at the superior and inferior breast, lateral L brachium, medial to lateral L brachium, all surfaces of antebrachium, re-worked all surfaces after STM/Myofascial release and then deep abdominals    Passive ROM  Into abduction w/STM over cording.              PT Education - 12/25/19 1103    Education Details  Pt was educated to continue to work on her exercises at home especially stretching into her end range with easy end range stretch to help break up scar tissue. Discussed compression bra and pt was measured as well as agreed to have her information sent to [employer]    Person(s) Educated  Patient    Methods  Explanation    Comprehension  Verbalized understanding          PT Long Term Goals - 12/04/19 1052      PT LONG TERM GOAL #1   Title  Pt will be ind with self breast and upper arm MLD or obtain compression pump    Status  Partially Met      PT LONG TERM GOAL #2   Title  Pt will obtain appropriate bra and sleeve for lymphedema management    Status  Partially Met      PT LONG TERM GOAL #3   Title  Pt will demonstrate low fall risk on the BERG balance  scale or similar in the clinic    Status  Achieved      PT LONG TERM GOAL #4   Title  Pt will decrease LLIS to 17% or less    Status  On-going            Plan - 12/25/19 1002    Clinical Impression Statement  Pt reports that she has been wearing her compression garment and using her pump consistently. She continues with cording noted in her LUE that was  tender to touch initially with MLD that improved following myofascial release with multiple "pops" noted in the L medial antebrachium; pt became tearful due to pain and changed technique. Then tried A/ROM and P/ROM performed with petrissage with improvement in mobility following 10 repetitions of each; pt was able to tolerate this better. MLD was performed prior to STM/myofascial release and then re-worked following STM/Myofascial release in order to decrease fluid build up and promote fluid flow. Pt was measured for compression bra at this time. Pt will benefit from continued POC.    Personal Factors and Comorbidities  Age;Fitness;Comorbidity 2    Comorbidities  radiation, ALND    Examination-Activity Limitations  Lift    Examination-Participation Restrictions  Yard Work;Community Activity    Rehab Potential  Excellent    Clinical Impairments Affecting Rehab Potential  ALND, radiation, size of breast    PT Frequency  2x / week    PT Duration  4 weeks    PT Treatment/Interventions  ADLs/Self Care Home Management;Patient/family education;Therapeutic exercise;Manual lymph drainage;Compression bandaging;Manual techniques;Passive range of motion;Taping;Neuromuscular re-education;Balance training    PT Next Visit Plan  Finish Strength ABC from pg 21; then cont manual therapy focusing on cording in Lt UE and Lt breast MLD    PT Home Exercise Plan  Post op shoulder ROM HEP, self breast and arm MLD, supine scap yellow; use compression pump daily and wear compression garments    Consulted and Agree with Plan of Care  Patient       Patient will  benefit from skilled therapeutic intervention in order to improve the following deficits and impairments:  Impaired UE functional use, Pain, Postural dysfunction, Decreased knowledge of precautions, Decreased range of motion, Increased edema, Decreased skin integrity  Visit Diagnosis: Malignant neoplasm of upper-outer quadrant of left breast in female, estrogen receptor positive (HCC)  Abnormal posture  Lymphedema, not elsewhere classified  Stiffness of left shoulder, not elsewhere classified  Stiffness of right shoulder, not elsewhere classified     Problem List Patient Active Problem List   Diagnosis Date Noted  . Chemotherapy-induced peripheral neuropathy ([place]) 03/30/2019  . Port-A-Cath in place 12/15/2018  . Genetic testing 09/21/2018  . Family history of breast cancer   . Malignant neoplasm of upper-outer quadrant of left breast in female, estrogen receptor positive ([place]) 08/29/2018  . Hyperlipidemia 08/04/2018  . Obesity (BMI 30-39.9) 05/09/2013  . Preventative health care 11/19/2010  . URI (upper respiratory infection) 11/19/2010  . KNEE PAIN, BILATERAL 09/25/2010  . SINUSITIS - ACUTE-NOS 12/24/2009  . MORBID OBESITY 06/14/2009  . COLD SORE 10/16/2008  . SKIN TAG 06/18/2008  . Hypothyroidism 03/17/2007  . Essential hypertension 03/17/2007    Ander Purpura, PT 12/25/2019, 11:18 AM  [place], [place], [address] Phone: [phone]   Fax:  [phone]  Name: Jamisen Hawes MRN: [id number] Date of Birth: [birth year]

## 2019-12-27 ENCOUNTER — Encounter: Payer: Self-pay | Admitting: Rehabilitation

## 2019-12-27 ENCOUNTER — Ambulatory Visit: Payer: BC Managed Care – PPO | Admitting: Rehabilitation

## 2019-12-27 ENCOUNTER — Other Ambulatory Visit: Payer: Self-pay

## 2019-12-27 DIAGNOSIS — C50412 Malignant neoplasm of upper-outer quadrant of left female breast: Secondary | ICD-10-CM | POA: Diagnosis not present

## 2019-12-27 DIAGNOSIS — M25612 Stiffness of left shoulder, not elsewhere classified: Secondary | ICD-10-CM

## 2019-12-27 DIAGNOSIS — M25611 Stiffness of right shoulder, not elsewhere classified: Secondary | ICD-10-CM

## 2019-12-27 DIAGNOSIS — I890 Lymphedema, not elsewhere classified: Secondary | ICD-10-CM

## 2019-12-27 DIAGNOSIS — R293 Abnormal posture: Secondary | ICD-10-CM

## 2019-12-27 NOTE — Therapy (Signed)
[place], [place], [address] Phone: [phone]   Fax:  [phone]  Physical Therapy Treatment  Patient Details  Name: Kayla Banks MRN: [id number] Date of Birth: [birth year] Referring Provider (PT): Dr. Erroll Luna   Encounter Date: 12/27/2019  PT End of Session - 12/27/19 1538    Visit Number  21    Number of Visits  33    Date for PT Re-Evaluation  01/01/20    PT Start Time  3976    PT Stop Time  1055    PT Time Calculation (min)  53 min    Activity Tolerance  Patient tolerated treatment well    Behavior During Therapy  [facility] for tasks assessed/performed       Past Medical History:  Diagnosis Date  . Complication of anesthesia   . Family history of breast cancer   . Heart murmur    no problems, saw cardiologist 2010  . Hypertension    on meds  . Hypothyroidism   . Osteoarthritis   . Plantar fasciitis   . PONV (postoperative nausea and vomiting)   . Thyroid disease    Hypothyroidism    Past Surgical History:  Procedure Laterality Date  . ABDOMINAL HYSTERECTOMY  02/2012  . AXILLARY LYMPH NODE DISSECTION Left 11/09/2018   Procedure: LEFT AXILLARY LYMPH NODE DISSECTION;  Surgeon: Erroll Luna, MD;  Location: [place];  Service: General;  Laterality: Left;  . BREAST LUMPECTOMY WITH RADIOACTIVE SEED AND SENTINEL LYMPH NODE BIOPSY Left 10/11/2018   Procedure: LEFT BREAST LUMPECTOMY WITH RADIOACTIVE SEED AND LEFT SENTINEL LYMPH NODE MAPPING WITH LEFT TARGETED LYMPH NODE BIOPSY;  Surgeon: Erroll Luna, MD;  Location: [place];  Service: General;  Laterality: Left;  . FOOT SURGERY Left    bone spurs  . LUMBAR LAMINECTOMY  1990  . PORT-A-CATH REMOVAL N/A 05/23/2019   Procedure: PORT REMOVAL;  Surgeon: Erroll Luna, MD;  Location: [place];  Service: General;  Laterality: N/A;  . PORTACATH PLACEMENT Right 12/01/2018   Procedure:  INSERTION PORT-A-CATH WITH ULTRASOUND;  Surgeon: Erroll Luna, MD;  Location: [place];  Service: General;  Laterality: Right;    There were no vitals filed for this visit.  Subjective Assessment - 12/27/19 1005    Subjective  My arm is the trouble now.  I never really have breast pain anymore.  I am a little sore but not feeling the cording much anymore.    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 Chemo completed with Adriamycin and taxol and radiation also completed.  Hypothyroidism, HTN, obesity, OA bil knees    Currently in Pain?  No/denies         [facility] PT Assessment - 12/27/19 0001      AROM   Left Shoulder Flexion  128 Degrees   not axillary pull more shoulder pull and no arm pain   Left Shoulder ABduction  132 Degrees   pull in axilla   Left Shoulder Horizontal ABduction  50 Degrees               Outpatient Rehab from 08/03/2019 in [place]  Lymphedema Life Impact Scale Total Score  27.94 %           [facility] Adult PT Treatment/Exercise - 12/27/19 0001      Manual Therapy   Soft tissue  mobilization  STM petrissage over cording from wrist into axilla, work into the axillary borders in overhead radiation position    Myofascial Release  Myofascial release along the medial L brachium/antebrachium using cross hand technique and blocking of the cord at the elbow with pt performing active elbow flexion and extension as well as blocking of the cording at the axilla with pt performing active Y position diagonals.      Manual Lymphatic Drainage (MLD)  In supine: short neck,  Bil shoulders, Rt axillary and L inguinal nodes, established anterior inter-axillary anastomosis and L axillo-inguinal anastomosis, lateral breast toward the L axillo-inguinal anastomosis then re-worked anastomosis, lateral L brachium, medial to lateral L  brachium, all surfaces of antebrachium, re-worked all surfaces after STM/Myofascial release and then deep abdominals    Passive ROM  into flexion, abduction, ER at 90deg of abduction                  PT Long Term Goals - 12/27/19 1538      PT LONG TERM GOAL #1   Title  Pt will be ind with self breast and upper arm MLD or obtain compression pump    Status  Achieved      PT LONG TERM GOAL #2   Title  Pt will obtain appropriate bra and sleeve for lymphedema management    Baseline  has compression sleeve and working on bra    Status  Partially Met      PT LONG TERM GOAL #3   Title  Pt will demonstrate low fall risk on the BERG balance scale or similar in the clinic    Status  Achieved      PT LONG TERM GOAL #4   Title  Pt will decrease LLIS to 17% or less    Status  On-going      PT LONG TERM GOAL #5   Title  Pt will decrease Lt shoulder AROM to no pulling from the cording in the UE    Baseline  pulling into the arm with flexion AROM    Time  4    Period  Weeks    Status  New            Plan - 12/27/19 1539    Clinical Impression Statement  Pt with much improved Lt breast since this PT last saw pt with improved skin mobility and less overall hardness of the breast.  Pt has no remaining Lt breast pain and likes the compression pump for both the breast and UE.  Cording noted today in the antecubital fossa with one small cord here and more so in the axilla towards the upper arm with mutliple larger cords evident here most noticed in flexion, abduction, and ER position overhead.    PT Frequency  2x / week    PT Duration  4 weeks    PT Treatment/Interventions  ADLs/Self Care Home Management;Patient/family education;Therapeutic exercise;Manual lymph drainage;Compression bandaging;Manual techniques;Passive range of motion;Taping;Neuromuscular re-education;Balance training    PT Next Visit Plan  cont manual therapy focusing on cording in Lt UE and Lt breast MLD, pt may be  getting close to DC noting that the breast has minimal pain and the UE is not pulling much anymore.    PT Home Exercise Plan  Post op shoulder ROM HEP, self breast and arm MLD, supine scap yellow; use compression pump daily and wear compression garments    Consulted and Agree with Plan of Care  Patient         Patient will benefit from skilled therapeutic intervention in order to improve the following deficits and impairments:     Visit Diagnosis: Malignant neoplasm of upper-outer quadrant of left breast in female, estrogen receptor positive (HCC)  Abnormal posture  Lymphedema, not elsewhere classified  Stiffness of left shoulder, not elsewhere classified  Stiffness of right shoulder, not elsewhere classified     Problem List Patient Active Problem List   Diagnosis Date Noted  . Chemotherapy-induced peripheral neuropathy ([place]) 03/30/2019  . Port-A-Cath in place 12/15/2018  . Genetic testing 09/21/2018  . Family history of breast cancer   . Malignant neoplasm of upper-outer quadrant of left breast in female, estrogen receptor positive ([place]) 08/29/2018  . Hyperlipidemia 08/04/2018  . Obesity (BMI 30-39.9) 05/09/2013  . Preventative health care 11/19/2010  . URI (upper respiratory infection) 11/19/2010  . KNEE PAIN, BILATERAL 09/25/2010  . SINUSITIS - ACUTE-NOS 12/24/2009  . MORBID OBESITY 06/14/2009  . COLD SORE 10/16/2008  . SKIN TAG 06/18/2008  . Hypothyroidism 03/17/2007  . Essential hypertension 03/17/2007    Stark Bray 12/27/2019, 3:43 PM  [place], [place], [address] Phone: [phone]   Fax:  [phone]  Name: Kayla Banks MRN: [id number] Date of Birth: [birth year]

## 2019-12-29 ENCOUNTER — Encounter: Payer: Self-pay | Admitting: Physical Therapy

## 2019-12-29 ENCOUNTER — Encounter: Payer: Self-pay | Admitting: *Deleted

## 2020-01-01 ENCOUNTER — Ambulatory Visit: Payer: BC Managed Care – PPO | Admitting: Physical Therapy

## 2020-01-01 ENCOUNTER — Other Ambulatory Visit: Payer: Self-pay

## 2020-01-01 ENCOUNTER — Ambulatory Visit (INDEPENDENT_AMBULATORY_CARE_PROVIDER_SITE_OTHER): Payer: BC Managed Care – PPO | Admitting: Family Medicine

## 2020-01-01 ENCOUNTER — Encounter: Payer: Self-pay | Admitting: Family Medicine

## 2020-01-01 ENCOUNTER — Ambulatory Visit: Payer: BC Managed Care – PPO

## 2020-01-01 VITALS — BP 160/90 | HR 84 | Temp 97.4°F | Resp 18 | Ht 65.0 in | Wt 253.6 lb

## 2020-01-01 DIAGNOSIS — M170 Bilateral primary osteoarthritis of knee: Secondary | ICD-10-CM

## 2020-01-01 DIAGNOSIS — I890 Lymphedema, not elsewhere classified: Secondary | ICD-10-CM

## 2020-01-01 DIAGNOSIS — E039 Hypothyroidism, unspecified: Secondary | ICD-10-CM

## 2020-01-01 DIAGNOSIS — R293 Abnormal posture: Secondary | ICD-10-CM

## 2020-01-01 DIAGNOSIS — Z170 Estrogen receptor positive status [ER+]: Secondary | ICD-10-CM

## 2020-01-01 DIAGNOSIS — Z23 Encounter for immunization: Secondary | ICD-10-CM

## 2020-01-01 DIAGNOSIS — Z0000 Encounter for general adult medical examination without abnormal findings: Secondary | ICD-10-CM | POA: Diagnosis not present

## 2020-01-01 DIAGNOSIS — G629 Polyneuropathy, unspecified: Secondary | ICD-10-CM

## 2020-01-01 DIAGNOSIS — I10 Essential (primary) hypertension: Secondary | ICD-10-CM

## 2020-01-01 DIAGNOSIS — M25612 Stiffness of left shoulder, not elsewhere classified: Secondary | ICD-10-CM

## 2020-01-01 DIAGNOSIS — Z8619 Personal history of other infectious and parasitic diseases: Secondary | ICD-10-CM | POA: Diagnosis not present

## 2020-01-01 DIAGNOSIS — C50412 Malignant neoplasm of upper-outer quadrant of left female breast: Secondary | ICD-10-CM | POA: Diagnosis not present

## 2020-01-01 MED ORDER — VALACYCLOVIR HCL 1 G PO TABS: ORAL_TABLET | ORAL | 2 refills | Status: DC

## 2020-01-01 MED ORDER — MELOXICAM 15 MG PO TABS: ORAL_TABLET | ORAL | 0 refills | Status: DC

## 2020-01-01 MED ORDER — GABAPENTIN 300 MG PO CAPS: ORAL_CAPSULE | ORAL | 2 refills | Status: DC

## 2020-01-01 MED ORDER — SYNTHROID 100 MCG PO TABS: ORAL_TABLET | ORAL | 3 refills | Status: DC

## 2020-01-01 MED ORDER — AMLODIPINE BESYLATE 5 MG PO TABS: 5.0000 mg | ORAL_TABLET | Freq: Every day | ORAL | 1 refills | Status: DC

## 2020-01-01 MED ORDER — FUROSEMIDE 20 MG PO TABS: ORAL_TABLET | ORAL | 1 refills | Status: DC

## 2020-01-01 MED ORDER — CARVEDILOL 25 MG PO TABS: ORAL_TABLET | ORAL | 1 refills | Status: DC

## 2020-01-01 NOTE — Progress Notes (Signed)
Subjective:     Kayla Banks is a 64 y.o. female and is here for a comprehensive physical exam. The patient reports no new problems .   She is in cancer rehab for lymphedema and was released from oncology for breast cancer  She also needs meds refills and check labs   Social History   Socioeconomic History  . Marital status: Single    Spouse name: Not on file  . Number of children: Not on file  . Years of education: Not on file  . Highest education level: Not on file  Occupational History  . 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 Smoking status: Never Smoker  . Smokeless tobacco: Never Used  Substance and Sexual Activity  . Alcohol use: Yes    Comment: occassionally  . Drug use: No  . Sexual activity: Yes    Partners: Male  Other Topics Concern  . Not on file  Social History Narrative   Exercise-- no   Social Determinants of Health   Financial Resource Strain:   . Difficulty of Paying Living Expenses:   Food Insecurity:   . Worried About Charity fundraiser in the Last Year:   . Arboriculturist in the Last Year:   Transportation Needs:   . Film/video editor (Medical):   Marland Kitchen Lack of Transportation (Non-Medical):   Physical Activity:   . Days of Exercise per Week:   . Minutes of Exercise per Session:   Stress:   . Feeling of Stress :   Social Connections:   . Frequency of Communication with Friends and Family:   . Frequency of Social Gatherings with Friends and Family:   . Attends Religious Services:   . Active Member of Clubs or Organizations:   . Attends Archivist Meetings:   Marland Kitchen Marital Status:   Intimate Partner Violence:   . Fear of Current or Ex-Partner:   . Emotionally Abused:   Marland Kitchen Physically Abused:   . Sexually Abused:    Health Maintenance  Topic Date Due  . PAP SMEAR-Modifier  02/07/2018  . COLONOSCOPY  09/18/2018  . TETANUS/TDAP  11/16/2019  . INFLUENZA VACCINE  03/17/2020  . MAMMOGRAM  07/29/2020  . COVID-19  Vaccine  Completed  . Hepatitis C Screening  Completed  . HIV Screening  Completed    The following portions of the patient's history were reviewed and updated as appropriate:  She  has a past medical history of Complication of anesthesia, Family history of breast cancer, Heart murmur, Hypertension, Hypothyroidism, Osteoarthritis, Plantar fasciitis, PONV (postoperative nausea and vomiting), and Thyroid disease. She does not have any pertinent problems on file. She  has a past surgical history that includes Lumbar laminectomy (1990); Foot surgery (Left); Abdominal hysterectomy (02/2012); Breast lumpectomy with radioactive seed and sentinel lymph node biopsy (Left, 10/11/2018); Axillary lymph node dissection (Left, 11/09/2018); Portacath placement (Right, 12/01/2018); and Port-a-cath removal (N/A, 05/23/2019). Her family history includes Breast cancer in her maternal aunt and another family member; Diabetes in her mother; Heart disease (age of onset: 68) in her father; Hyperlipidemia in her mother; Hypertension in her father and mother; Hyperthyroidism in her sister; Hypothyroidism in her sister. She  reports that she has never smoked. She has never used smokeless tobacco. She reports current alcohol use. She reports that she does not use drugs. She has a current medication list which includes the following prescription(s): amlodipine, anastrozole, carvedilol, epinephrine, furosemide, gabapentin, ibuprofen, lorazepam, meloxicam, synthroid,  and valacyclovir. Current Outpatient Medications on File Prior to Visit  Medication Sig Dispense Refill  . amLODipine (NORVASC) 5 MG tablet Take 1 tablet (5 mg total) by mouth daily. Needs ov before any more refills 30 tablet 0  . anastrozole (ARIMIDEX) 1 MG tablet Take 1 tablet (1 mg total) by mouth daily. 90 tablet 3  . carvedilol (COREG) 25 MG tablet TAKE 1 TABLET(25 MG) BY MOUTH TWICE DAILY WITH A MEAL. Needs ov before any more refills 60 tablet 0  . EPINEPHRINE 0.3  mg/0.3 mL IJ SOAJ injection INJECT 0.3 ML(1 SYRINGE) IN THE MUSCLE AS NEEDED FOR ALLERGIC REACTION 2 Device 0  . furosemide (LASIX) 20 MG tablet Needs ov before any more refills 90 tablet 0  . gabapentin (NEURONTIN) 300 MG capsule TAKE 1 CAPSULE(300 MG) BY MOUTH AT BEDTIME 30 capsule 2  . ibuprofen (ADVIL) 800 MG tablet Take 1 tablet (800 mg total) by mouth every 8 (eight) hours as needed. 30 tablet 0  . LORazepam (ATIVAN) 0.5 MG tablet TAKE 1 TABLET(0.5 MG) BY MOUTH AT BEDTIME AS NEEDED FOR SLEEP 30 tablet 1  . meloxicam (MOBIC) 15 MG tablet TAKE 1 TABLET(15 MG) BY MOUTH DAILY AS NEEDED FOR PAIN 90 tablet 0  . SYNTHROID 100 MCG tablet TAKE 1 TABLET BY MOUTH EVERY DAY 90 tablet 0  . valACYclovir (VALTREX) 1000 MG tablet TAKE 1 TABLET BY MOUTH THREE TIMES DAILY AS NEEDED 30 tablet 0   No current facility-administered medications on file prior to visit.   She is allergic to erythromycin; lisinopril; penicillins; and adhesive [tape]..  Review of Systems Review of Systems  Constitutional: Negative for activity change, appetite change and fatigue.  HENT: Negative for hearing loss, congestion, tinnitus and ear discharge.  dentist q81m Eyes: Negative for visual disturbance (see optho q1y -- vision corrected to 20/20 with glasses).  Respiratory: Negative for cough, chest tightness and shortness of breath.   Cardiovascular: Negative for chest pain, palpitations and leg swelling.  Gastrointestinal: Negative for abdominal pain, diarrhea, constipation and abdominal distention.  Genitourinary: Negative for urgency, frequency, decreased urine volume and difficulty urinating.  Musculoskeletal: Negative for back pain, arthralgias and gait problem.  Skin: Negative for color change, pallor and rash.  Neurological: Negative for dizziness, light-headedness,and headaches. + numbness Hematological: Negative for adenopathy. Does not bruise/bleed easily.  Psychiatric/Behavioral: Negative for suicidal ideas,  confusion, sleep disturbance, self-injury, dysphoric mood, decreased concentration and agitation.       Objective:    BP (!) 160/90 (BP Location: Right Arm, Patient Position: Sitting, Cuff Size: Large)   Pulse 84   Temp (!) 97.4 F (36.3 C) (Temporal)   Resp 18   Ht 5\' 5"  (1.651 m)   Wt 253 lb 9.6 oz (115 kg)   LMP 01/28/2012   SpO2 98%   BMI 42.20 kg/m  General appearance: alert, cooperative, appears stated age and no distress Head: Normocephalic, without obvious abnormality, atraumatic Eyes: negative findings: lids and lashes normal, conjunctivae and sclerae normal and pupils equal, round, reactive to light and accomodation Ears: normal TM's and external ear canals both ears Neck: no adenopathy, no carotid bruit, no JVD, supple, symmetrical, trachea midline and thyroid not enlarged, symmetric, no tenderness/mass/nodules Back: symmetric, no curvature. ROM normal. No CVA tenderness. Lungs: clear to auscultation bilaterally Breasts: normal appearance, no masses or tenderness Heart: regular rate and rhythm, S1, S2 normal, no murmur, click, rub or gallop Abdomen: soft, non-tender; bowel sounds normal; no masses,  no organomegaly Pelvic: not indicated; status post  hysterectomy, negative ROS Extremities: extremities normal, atraumatic, no cyanosis or edema Pulses: 2+ and symmetric Skin: Skin color, texture, turgor normal. No rashes or lesions Lymph nodes: Cervical, supraclavicular, and axillary nodes normal. Neurologic: Alert and oriented X 3, normal strength and tone. Normal symmetric reflexes. Normal coordination and gait    Assessment:    Healthy female exam.      Plan:     ghm utd Check labs See After Visit Summary for Counseling Recommendations    1. Preventative health care ghm utd  Check labs  - Lipid panel - CBC with Differential/Platelet - TSH - Comprehensive metabolic panel  2. Need for prophylactic vaccination against diphtheria and tetanus  - Tdap  vaccine greater than or equal to 7yo IM  3. Essential hypertension Poorly controlled will alter medications, encouraged DASH diet, minimize caffeine and obtain adequate sleep. Report concerning symptoms and follow up as directed and as needed--- she did not take her meds today - furosemide (LASIX) 20 MG tablet; 1 po qd  Dispense: 90 tablet; Refill: 1 - carvedilol (COREG) 25 MG tablet; TAKE 1 TABLET(25 MG) BY MOUTH TWICE DAILY WITH A MEAL.  Dispense: 180 tablet; Refill: 1 - amLODipine (NORVASC) 5 MG tablet; Take 1 tablet (5 mg total) by mouth daily. Needs ov before any more refills  Dispense: 90 tablet; Refill: 1  4. Primary osteoarthritis of both knees  - meloxicam (MOBIC) 15 MG tablet; 1 po qd prn  Dispense: 90 tablet; Refill: 0  5. Hypothyroidism Check labs  - SYNTHROID 100 MCG tablet; TAKE 1 TABLET BY MOUTH EVERY DAY  Dispense: 90 tablet; Refill: 3  6. H/O cold sores   - valACYclovir (VALTREX) 1000 MG tablet; TAKE 1 TABLET BY MOUTH THREE TIMES DAILY AS NEEDED  Dispense: 30 tablet; Refill: 2  7. Neuropathy   - gabapentin (NEURONTIN) 300 MG capsule; TAKE 1 CAPSULE(300 MG) po bid  Dispense: 60 capsule; Refill: 2

## 2020-01-01 NOTE — Therapy (Signed)
[place], [place], [address] Phone: [phone]   Fax:  [phone]  Physical Therapy Treatment  Patient Details  Name: Kayla Banks MRN: [id number] Date of Birth: [birth year] Referring Provider (PT): Dr. Erroll Luna   Encounter Date: 01/01/2020  PT End of Session - 01/01/20 1056    Visit Number  22    Number of Visits  33    Date for PT Re-Evaluation  01/01/20    PT Start Time  1010    PT Stop Time  1054   pt arrived late and had to leave early for a meeting   PT Time Calculation (min)  44 min    Activity Tolerance  Patient tolerated treatment well    Behavior During Therapy  [facility] for tasks assessed/performed       Past Medical History:  Diagnosis Date  . Complication of anesthesia   . Family history of breast cancer   . Heart murmur    no problems, saw cardiologist 2010  . Hypertension    on meds  . Hypothyroidism   . Osteoarthritis   . Plantar fasciitis   . PONV (postoperative nausea and vomiting)   . Thyroid disease    Hypothyroidism    Past Surgical History:  Procedure Laterality Date  . ABDOMINAL HYSTERECTOMY  02/2012  . AXILLARY LYMPH NODE DISSECTION Left 11/09/2018   Procedure: LEFT AXILLARY LYMPH NODE DISSECTION;  Surgeon: Erroll Luna, MD;  Location: [place];  Service: General;  Laterality: Left;  . BREAST LUMPECTOMY WITH RADIOACTIVE SEED AND SENTINEL LYMPH NODE BIOPSY Left 10/11/2018   Procedure: LEFT BREAST LUMPECTOMY WITH RADIOACTIVE SEED AND LEFT SENTINEL LYMPH NODE MAPPING WITH LEFT TARGETED LYMPH NODE BIOPSY;  Surgeon: Erroll Luna, MD;  Location: [place];  Service: General;  Laterality: Left;  . FOOT SURGERY Left    bone spurs  . LUMBAR LAMINECTOMY  1990  . PORT-A-CATH REMOVAL N/A 05/23/2019   Procedure: PORT REMOVAL;  Surgeon: Erroll Luna, MD;  Location: [place];  Service: General;  Laterality: N/A;  .  PORTACATH PLACEMENT Right 12/01/2018   Procedure: INSERTION PORT-A-CATH WITH ULTRASOUND;  Surgeon: Erroll Luna, MD;  Location: [place];  Service: General;  Laterality: Right;    There were no vitals filed for this visit.  Subjective Assessment - 01/01/20 1014    Subjective  The tenderness is improving some at my anterior elbow and axilla. The forearm is much improved since Cathy loosened up those cords.    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 Chemo completed with Adriamycin and taxol and radiation also completed.  Hypothyroidism, HTN, obesity, OA bil knees    Patient Stated Goals  decrease breast swelling is the arm swollen    Currently in Pain?  No/denies                   [facility]  Lymphedema Life Impact Scale Total Score  27.94 %           [facility] Adult PT Treatment/Exercise - 01/01/20 0001      Manual Therapy   Myofascial Release  Myofascial release along the medial L brachium/antebrachium using cross hand technique and blocking of the cord at the elbow with pt performing     Manual Lymphatic Drainage (MLD)  In supine: short neck,  Bil shoulders, Rt axillary and Lt inguinal nodes, established anterior inter-axillary anastomosis and L axillo-inguinal anastomosis, lateral breast toward the L axillo-inguinal anastomosis then re-worked anastomosis, lateral L brachium, medial to lateral L brachium, all surfaces of antebrachium, re-worked all surfaces after STM/Myofascial release    Passive ROM  into flexion, abduction                  PT Long Term Goals - 12/27/19 1538      PT LONG TERM GOAL #1   Title  Pt will be ind with self breast and upper arm MLD or obtain compression pump    Status  Achieved      PT LONG TERM GOAL #2   Title  Pt will obtain appropriate bra and sleeve  for lymphedema management    Baseline  has compression sleeve and working on bra    Status  Partially Met      PT LONG TERM GOAL #3   Title  Pt will demonstrate low fall risk on the BERG balance scale or similar in the clinic    Status  Achieved      PT LONG TERM GOAL #4   Title  Pt will decrease LLIS to 17% or less    Status  On-going      PT LONG TERM GOAL #5   Title  Pt will decrease Lt shoulder AROM to no pulling from the cording in the UE    Baseline  pulling into the arm with flexion AROM    Time  4    Period  Weeks    Status  New            Plan - 01/01/20 1057    Clinical Impression Statement  Pt is continuing to use the compression pump daily reporting good results with self MLD after. Continued with focus on myofascial release to Lt axillary cording. Some improvement with this into forearm as pt reports previous PT visit had good progress with releasing this.    Personal Factors and Comorbidities  Age;Fitness;Comorbidity 2    Comorbidities  radiation, ALND    Examination-Activity Limitations  Lift    Examination-Participation Restrictions  Yard Work;Community Activity    Stability/Clinical Decision Making  Evolving/Moderate complexity    Rehab Potential  Excellent    Clinical Impairments Affecting Rehab Potential  ALND, radiation, size of breast    PT Frequency  2x / week    PT Duration  4 weeks    PT Treatment/Interventions  ADLs/Self Care Home Management;Patient/family education;Therapeutic exercise;Manual lymph drainage;Compression bandaging;Manual techniques;Passive range of motion;Taping;Neuromuscular re-education;Balance training    PT Next Visit Plan  cont manual therapy focusing on cording in Lt UE and Lt breast MLD    PT Home Exercise Plan  Post op shoulder ROM HEP, self breast and arm MLD, supine scap yellow; use compression pump daily and wear compression garments    Consulted and Agree with Plan of Care  Patient       Patient will benefit from skilled  therapeutic intervention in order to improve the following deficits and impairments:  Impaired UE functional use, Pain, Postural dysfunction, Decreased knowledge of precautions, Decreased range of motion, Increased edema, Decreased skin integrity  Visit Diagnosis: Malignant neoplasm of upper-outer quadrant of left breast in female, estrogen receptor positive (HCC)  Abnormal posture  Lymphedema, not elsewhere classified  Stiffness of left shoulder, not elsewhere classified     Problem List Patient Active Problem List   Diagnosis Date Noted  .  Chemotherapy-induced peripheral neuropathy ([place]) 03/30/2019  . Port-A-Cath in place 12/15/2018  . Genetic testing 09/21/2018  . Family history of breast cancer   . Malignant neoplasm of upper-outer quadrant of left breast in female, estrogen receptor positive ([place]) 08/29/2018  . Hyperlipidemia 08/04/2018  . Obesity (BMI 30-39.9) 05/09/2013  . Preventative health care 11/19/2010  . URI (upper respiratory infection) 11/19/2010  . KNEE PAIN, BILATERAL 09/25/2010  . SINUSITIS - ACUTE-NOS 12/24/2009  . MORBID OBESITY 06/14/2009  . COLD SORE 10/16/2008  . SKIN TAG 06/18/2008  . Hypothyroidism 03/17/2007  . Essential hypertension 03/17/2007    Otelia Limes, PTA 01/01/2020, 1:05 PM  [place], [place], [address] Phone: [phone]   Fax:  [phone]  Name: Kayla Banks MRN: [id number] Date of Birth: 08-23-1955

## 2020-01-01 NOTE — Patient Instructions (Signed)

## 2020-01-02 LAB — LIPID PANEL
Cholesterol: 205 mg/dL — ABNORMAL HIGH (ref 0–200)
HDL: 67.2 mg/dL (ref >39.00)
LDL Cholesterol: 127 mg/dL — ABNORMAL HIGH (ref 0–99)
NonHDL: 137.98
Total CHOL/HDL Ratio: 3
Triglycerides: 55 mg/dL (ref 0.0–149.0)
VLDL: 11 mg/dL (ref 0.0–40.0)

## 2020-01-02 LAB — CBC WITH DIFFERENTIAL/PLATELET
Basophils Absolute: 0.1 10*3/uL (ref 0.0–0.1)
Basophils Relative: 1.2 % (ref 0.0–3.0)
Eosinophils Absolute: 0.2 10*3/uL (ref 0.0–0.7)
Eosinophils Relative: 2 % (ref 0.0–5.0)
HCT: 39.2 % (ref 36.0–46.0)
Hemoglobin: 12.8 g/dL (ref 12.0–15.0)
Lymphocytes Relative: 17.3 % (ref 12.0–46.0)
Lymphs Abs: 1.6 10*3/uL (ref 0.7–4.0)
MCHC: 32.7 g/dL (ref 30.0–36.0)
MCV: 84.9 fl (ref 78.0–100.0)
Monocytes Absolute: 0.7 10*3/uL (ref 0.1–1.0)
Monocytes Relative: 7.7 % (ref 3.0–12.0)
Neutro Abs: 6.5 10*3/uL (ref 1.4–7.7)
Neutrophils Relative %: 71.8 % (ref 43.0–77.0)
Platelets: 254 10*3/uL (ref 150.0–400.0)
RBC: 4.62 Mil/uL (ref 3.87–5.11)
RDW: 15.7 % — ABNORMAL HIGH (ref 11.5–15.5)
WBC: 9 10*3/uL (ref 4.0–10.5)

## 2020-01-02 LAB — COMPREHENSIVE METABOLIC PANEL
ALT: 10 U/L (ref 0–35)
AST: 14 U/L (ref 0–37)
Albumin: 3.9 g/dL (ref 3.5–5.2)
Alkaline Phosphatase: 82 U/L (ref 39–117)
BUN: 13 mg/dL (ref 6–23)
CO2: 32 mEq/L (ref 19–32)
Calcium: 9.3 mg/dL (ref 8.4–10.5)
Chloride: 103 mEq/L (ref 96–112)
Creatinine, Ser: 0.6 mg/dL (ref 0.40–1.20)
GFR: 122.6 mL/min (ref >60.00)
Glucose, Bld: 86 mg/dL (ref 70–99)
Potassium: 4.5 mEq/L (ref 3.5–5.1)
Sodium: 140 mEq/L (ref 135–145)
Total Bilirubin: 0.8 mg/dL (ref 0.2–1.2)
Total Protein: 6.9 g/dL (ref 6.0–8.3)

## 2020-01-02 LAB — TSH: TSH: 2.61 u[IU]/mL (ref 0.35–4.50)

## 2020-01-03 ENCOUNTER — Ambulatory Visit: Payer: BC Managed Care – PPO

## 2020-01-03 ENCOUNTER — Other Ambulatory Visit: Payer: Self-pay

## 2020-01-03 DIAGNOSIS — Z170 Estrogen receptor positive status [ER+]: Secondary | ICD-10-CM

## 2020-01-03 DIAGNOSIS — M25611 Stiffness of right shoulder, not elsewhere classified: Secondary | ICD-10-CM

## 2020-01-03 DIAGNOSIS — R293 Abnormal posture: Secondary | ICD-10-CM

## 2020-01-03 DIAGNOSIS — I890 Lymphedema, not elsewhere classified: Secondary | ICD-10-CM

## 2020-01-03 DIAGNOSIS — C50412 Malignant neoplasm of upper-outer quadrant of left female breast: Secondary | ICD-10-CM | POA: Diagnosis not present

## 2020-01-03 DIAGNOSIS — M25612 Stiffness of left shoulder, not elsewhere classified: Secondary | ICD-10-CM

## 2020-01-03 NOTE — Therapy (Signed)
[place], [place], [address] Phone: [phone]   Fax:  [phone]  Physical Therapy Treatment  Patient Details  Name: Kayla Banks MRN: [id number] Date of Birth: 17-May-1956 Referring Provider (PT): Dr. Erroll Luna   Encounter Date: 01/03/2020  PT End of Session - 01/03/20 1010    Visit Number  23    Number of Visits  33    Date for PT Re-Evaluation  01/01/20    Authorization Type  [employer]    PT Start Time  1005    PT Stop Time  1100    PT Time Calculation (min)  55 min    Activity Tolerance  Patient tolerated treatment well    Behavior During Therapy  [facility] for tasks assessed/performed       Past Medical History:  Diagnosis Date  . Complication of anesthesia   . Family history of breast cancer   . Heart murmur    no problems, saw cardiologist 2010  . Hypertension    on meds  . Hypothyroidism   . Osteoarthritis   . Plantar fasciitis   . PONV (postoperative nausea and vomiting)   . Thyroid disease    Hypothyroidism    Past Surgical History:  Procedure Laterality Date  . ABDOMINAL HYSTERECTOMY  02/2012  . AXILLARY LYMPH NODE DISSECTION Left 11/09/2018   Procedure: LEFT AXILLARY LYMPH NODE DISSECTION;  Surgeon: Erroll Luna, MD;  Location: [facility];  Service: General;  Laterality: Left;  . BREAST LUMPECTOMY WITH RADIOACTIVE SEED AND SENTINEL LYMPH NODE BIOPSY Left 10/11/2018   Procedure: LEFT BREAST LUMPECTOMY WITH RADIOACTIVE SEED AND LEFT SENTINEL LYMPH NODE MAPPING WITH LEFT TARGETED LYMPH NODE BIOPSY;  Surgeon: Erroll Luna, MD;  Location: [place];  Service: General;  Laterality: Left;  . FOOT SURGERY Left    bone spurs  . LUMBAR LAMINECTOMY  1990  . PORT-A-CATH REMOVAL N/A 05/23/2019   Procedure: PORT REMOVAL;  Surgeon: Erroll Luna, MD;  Location: [place];  Service: General;  Laterality: N/A;  . PORTACATH PLACEMENT Right  12/01/2018   Procedure: INSERTION PORT-A-CATH WITH ULTRASOUND;  Surgeon: Erroll Luna, MD;  Location: [place];  Service: General;  Laterality: Right;    There were no vitals filed for this visit.  Subjective Assessment - 01/03/20 1010    Subjective  Pt states that she continues to feel pulling and pain in her medial L brachium down to her elbow.    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 Chemo completed with Adriamycin and taxol and radiation also completed.  Hypothyroidism, HTN, obesity, OA bil knees    Patient Stated Goals  decrease breast swelling is the arm swollen    Currently in Pain?  No/denies    Pain Score  0-No pain            LYMPHEDEMA/ONCOLOGY QUESTIONNAIRE - 01/03/20 1059      Left Upper Extremity Lymphedema   Other  115.5 cm bra measurements below the axilla            Outpatient Rehab from 08/03/2019 in [place]  Lymphedema Life Impact Scale Total Score  27.94 %           [facility] Adult PT Treatment/Exercise - 01/03/20 0001      Manual Therapy   Manual Therapy  Myofascial release;Manual Lymphatic Drainage (MLD);Edema management  Edema Management  measurement taken for bra strap this session.     Myofascial Release  Myofascial release in the medial L brachium to proximal antebrachium and into the L axilla with multiple pops noted during session.     Manual Lymphatic Drainage (MLD)  In supine: short neck,  Bil shoulders, Rt axillary and L inguinal nodes, established anterior inter-axillary anastomosis and L axillo-inguinal anastomosis, lateral breast toward the L axillo-inguinal anastomosis then re-worked anastomosis, lateral L brachium, medial to lateral L brachium, all surfaces of antebrachium, re-worked all surfaces after Myofascial release and then deep abdominals             PT Education - 01/03/20  1444    Education Details  Pt will continue with her currently exercises at home. She signed papers to agree to have information/pictures sent to [place] for custom made bra.    Person(s) Educated  Patient    Methods  Explanation    Comprehension  Verbalized understanding          PT Long Term Goals - 12/27/19 1538      PT LONG TERM GOAL #1   Title  Pt will be ind with self breast and upper arm MLD or obtain compression pump    Status  Achieved      PT LONG TERM GOAL #2   Title  Pt will obtain appropriate bra and sleeve for lymphedema management    Baseline  has compression sleeve and working on bra    Status  Partially Met      PT LONG TERM GOAL #3   Title  Pt will demonstrate low fall risk on the BERG balance scale or similar in the clinic    Status  Achieved      PT LONG TERM GOAL #4   Title  Pt will decrease LLIS to 17% or less    Status  On-going      PT LONG TERM GOAL #5   Title  Pt will decrease Lt shoulder AROM to no pulling from the cording in the UE    Baseline  pulling into the arm with flexion AROM    Time  4    Period  Weeks    Status  New            Plan - 01/03/20 1009    Clinical Impression Statement  Pt continues with cording in the LUE that goes past the elbow into the anterior antebrachium. Cross hand myofascial release this session into the L axilla and down into the anterior proximal L antebrachium; several "pops" noted in the L medial brachium and antebrachium across the antecubital fossa. MLD of the L breast and UE were performed prior to then re-worked after myofascial release. One last measurement was taken for custom made compression bra and pt agreed to information being sent to [facility] orthotics in order to obtain a compression bra. Pt will benefit from continued POC at this time.    Personal Factors and Comorbidities  Age;Fitness;Comorbidity 2    Comorbidities  radiation, ALND    Examination-Activity Limitations  Lift     Examination-Participation Restrictions  Yard Work;Community Activity    Rehab Potential  Excellent    Clinical Impairments Affecting Rehab Potential  ALND, radiation, size of breast    PT Frequency  2x / week    PT Duration  4 weeks    PT Treatment/Interventions  ADLs/Self Care Home Management;Patient/family education;Therapeutic exercise;Manual lymph drainage;Compression bandaging;Manual techniques;Passive range of motion;Taping;Neuromuscular re-education;Balance  training    PT Next Visit Plan  cont manual therapy focusing on cording in Lt UE and Lt breast MLD    PT Home Exercise Plan  Post op shoulder ROM HEP, self breast and arm MLD, supine scap yellow; use compression pump daily and wear compression garments    Consulted and Agree with Plan of Care  Patient       Patient will benefit from skilled therapeutic intervention in order to improve the following deficits and impairments:  Impaired UE functional use, Pain, Postural dysfunction, Decreased knowledge of precautions, Decreased range of motion, Increased edema, Decreased skin integrity  Visit Diagnosis: Malignant neoplasm of upper-outer quadrant of left breast in female, estrogen receptor positive (HCC)  Abnormal posture  Lymphedema, not elsewhere classified  Stiffness of left shoulder, not elsewhere classified  Stiffness of right shoulder, not elsewhere classified     Problem List Patient Active Problem List   Diagnosis Date Noted  . Chemotherapy-induced peripheral neuropathy ([place]) 03/30/2019  . Port-A-Cath in place 12/15/2018  . Genetic testing 09/21/2018  . Family history of breast cancer   . Malignant neoplasm of upper-outer quadrant of left breast in female, estrogen receptor positive ([place]) 08/29/2018  . Hyperlipidemia 08/04/2018  . Obesity (BMI 30-39.9) 05/09/2013  . Preventative health care 11/19/2010  . URI (upper respiratory infection) 11/19/2010  . KNEE PAIN, BILATERAL 09/25/2010  . SINUSITIS - ACUTE-NOS  12/24/2009  . MORBID OBESITY 06/14/2009  . COLD SORE 10/16/2008  . SKIN TAG 06/18/2008  . Hypothyroidism 03/17/2007  . Essential hypertension 03/17/2007    Ander Purpura, PT 01/03/2020, 2:49 PM  [place], [place], [address] Phone: [phone]   Fax:  [phone]  Name: Njeri Vicente MRN: [id number] Date of Birth: [birth year]

## 2020-01-08 ENCOUNTER — Other Ambulatory Visit: Payer: Self-pay | Admitting: Family Medicine

## 2020-01-08 ENCOUNTER — Ambulatory Visit: Payer: BC Managed Care – PPO

## 2020-01-08 DIAGNOSIS — E785 Hyperlipidemia, unspecified: Secondary | ICD-10-CM

## 2020-01-10 ENCOUNTER — Ambulatory Visit: Payer: BC Managed Care – PPO

## 2020-01-12 ENCOUNTER — Other Ambulatory Visit: Payer: Self-pay

## 2020-01-12 ENCOUNTER — Ambulatory Visit (INDEPENDENT_AMBULATORY_CARE_PROVIDER_SITE_OTHER): Payer: BC Managed Care – PPO | Admitting: Family Medicine

## 2020-01-12 VITALS — BP 159/87 | HR 75

## 2020-01-12 DIAGNOSIS — I10 Essential (primary) hypertension: Secondary | ICD-10-CM

## 2020-01-12 NOTE — Progress Notes (Addendum)
Pt here for Blood pressure check per Dr. Etter Sjogren  Pt currently takes: amlodipine 5 mg daily, carvedilol 25 mg bid   Pt reports compliance with medication.  BP today @ = 159/87 HR = 75  Please advise on what patient is to do regarding blood pressure.    Reviewed -- agree .Ann Held, DO

## 2020-03-11 ENCOUNTER — Other Ambulatory Visit: Payer: Self-pay | Admitting: Family Medicine

## 2020-03-11 DIAGNOSIS — E039 Hypothyroidism, unspecified: Secondary | ICD-10-CM

## 2020-04-05 ENCOUNTER — Other Ambulatory Visit: Payer: Self-pay | Admitting: Family Medicine

## 2020-04-05 DIAGNOSIS — M170 Bilateral primary osteoarthritis of knee: Secondary | ICD-10-CM

## 2020-04-28 NOTE — Progress Notes (Signed)
Patient Care Team: Carollee Herter, Alferd Apa, DO as PCP - General Dorna Leitz, MD as Consulting Physician (Orthopedic Surgery) Erroll Luna, MD as Consulting Physician (General Surgery) Nicholas Lose, MD as Consulting Physician (Hematology and Oncology) Kyung Rudd, MD as Consulting Physician (Radiation Oncology)  DIAGNOSIS:    ICD-10-CM   1. Malignant neoplasm of upper-outer quadrant of left breast in female, estrogen receptor positive ([place])  C50.412    Z17.0     SUMMARY OF ONCOLOGIC HISTORY: Oncology History  Malignant neoplasm of upper-outer quadrant of left breast in female, estrogen receptor positive ([place])  08/24/2018 Initial Biopsy   Screening detected left breast mass at 2:30 position 6 mm in size with one abnormal lymph node and stable benign calcifications, biopsy of the mass grade 2 IDC with DCIS and lymphovascular invasion, lymph node biopsy is positive, ER 95%, PR 40%, Ki-67 20%, HER-2 -1+ by IHC, T1b N1 stage Ib   08/31/2018 Cancer Staging   Staging form: Breast, AJCC 8th Edition - Clinical: Stage IB (cT1b, cN1(f), cM0, G2, ER+, PR+, HER2-) - Signed by Nicholas Lose, MD on 08/31/2018   09/21/2018 Genetic Testing   Genetic testing performed through [facility]'s Common Hereditary Cancers Panel reported out on 09/19/2018 showed no pathogenic mutations.  The Common Hereditary Cancer Panel offered by [facility] includes sequencing and/or deletion duplication testing of the following 53 genes: APC, ATM, AXIN2, BARD1, BMPR1A, BRCA1, BRCA2, BRIP1, BUB1B, CDH1, CDK4, CDKN2A, CHEK2, CTNNA1, DICER1, ENG, EPCAM, GALNT12, GREM1, HOXB13, KIT, MEN1, MLH1, MLH3, MSH2, MSH3, MSH6, MUTYH, NBN, NF1, NTHL1, PALB2, PDGFRA, PMS2, POLD1, POLE, PTEN, RAD50, RAD51C, RAD51D, RNF43, RPS20, SDHA, SDHB, SDHC, SDHD, SMAD4, SMARCA4, STK11, TP53, TSC1, TSC2, VHL.   A variant of uncertain significance (VUS) in a gene called NBN was also noted. c.1979G>C (p.Arg660Thr)   10/18/2018 Cancer Staging   Staging form:  Breast, AJCC 8th Edition - Pathologic stage from 10/18/2018: Stage IB (pT1b, pN2a, cM0, G2, ER+, PR+, HER2-) - Signed by Nicholas Lose, MD on 11/14/2018   11/09/2018 Surgery   Left lumpectomy ([place]) [phone]): Grade 2 IDC with DCIS, 1 cm, margins negative, DCIS focally 0.1 cm from superior margin, 2/2 lymph nodes positive, ER 95%, PR 45%, HER-2 negative, Ki-67 20% AXLND: 3/20 LN Pos (total 5/22)   12/15/2018 -  Chemotherapy   DOXOrubicin (ADRIAMYCIN) chemo injection 142 mg, 60 mg/m2 = 142 mg, Intravenous,  Once, 4 of 4 cycles. Administration: 142 mg (12/15/2018), 142 mg (12/29/2018), 142 mg (01/12/2019), 142 mg (01/26/2019)  palonosetron (ALOXI) injection 0.25 mg, 0.25 mg, Intravenous,  Once, 4 of 4 cycles. Administration: 0.25 mg (12/15/2018), 0.25 mg (12/29/2018), 0.25 mg (01/12/2019), 0.25 mg (01/26/2019)  pegfilgrastim-cbqv (UDENYCA) injection 6 mg, 6 mg, Subcutaneous, Once, 4 of 4 cycles. Administration: 6 mg (12/17/2018), 6 mg (12/31/2018), 6 mg (01/14/2019), 6 mg (01/28/2019)  cyclophosphamide (CYTOXAN) 1,420 mg in sodium chloride 0.9 % 250 mL chemo infusion, 600 mg/m2 = 1,420 mg, Intravenous,  Once, 4 of 4 cycles. Administration: 1,420 mg (12/15/2018), 1,420 mg (12/29/2018), 1,420 mg (01/12/2019), 1,420 mg (01/26/2019)  PACLitaxel (TAXOL) 192 mg in sodium chloride 0.9 % 250 mL chemo infusion (</= 7m/m2), 80 mg/m2 = 192 mg, Intravenous,  Once, 11 of 12 cycles. Dose modification: 60 mg/m2 (original dose 80 mg/m2, Cycle 12, Reason: Dose not tolerated). Administration: 192 mg (02/09/2019), 192 mg (02/16/2019), 192 mg (02/23/2019), 192 mg (03/02/2019), 192 mg (03/09/2019), 192 mg (03/16/2019), 192 mg (03/23/2019), 144 mg (03/30/2019), 144 mg (04/06/2019), 144 mg (04/14/2019), 144 mg (04/20/2019)  fosaprepitant (EMEND) 150 mg  dexamethasone (DECADRON) 12 mg in sodium chloride 0.9 % 145 mL IVPB, , Intravenous,  Once, 4 of 4 cycles Administration:  (12/15/2018),  (12/29/2018),  (01/12/2019),  (01/26/2019)     06/01/2019 -  07/21/2019 Radiation Therapy   The patient initially received a dose of 50.4 Gy in 28 fractions to the breast using whole-breast tangent fields. This was delivered using a 3-D conformal technique. The pt received a boost delivering an additional 10 Gy in 5 fractions using a electron boost with 26mV electrons. The total dose was 60.4 Gy.    08/2019 - 08/2024 Anti-estrogen oral therapy   Anastrozole     CHIEF COMPLIANT: Follow-up of left breast cancer on anastrozole   INTERVAL HISTORY: Kayla Banks a 64y.o. with above-mentioned history of left breast cancer who underwent a lumpectomy followed by an axillary lymph node dissection, adjuvant chemotherapy, radiation, and is currently on antiestrogen therapy with anastrozole. She presents to the clinic today for follow-up.  She is tolerating anastrozole fairly well except for occasional hot flashes.  She does have a trigger finger and there is some joint discomfort for which she is seeing orthopedics.  She has not had a mammogram in the last year and half.  She will make an appointment and do it.  ALLERGIES:  is allergic to erythromycin, lisinopril, penicillins, and adhesive [tape].  MEDICATIONS:  Current Outpatient Medications  Medication Sig Dispense Refill   amLODipine (NORVASC) 5 MG tablet Take 1 tablet (5 mg total) by mouth daily. Needs ov before any more refills 90 tablet 1   anastrozole (ARIMIDEX) 1 MG tablet Take 1 tablet (1 mg total) by mouth daily. 90 tablet 3   carvedilol (COREG) 25 MG tablet TAKE 1 TABLET(25 MG) BY MOUTH TWICE DAILY WITH A MEAL. 180 tablet 1   EPINEPHRINE 0.3 mg/0.3 mL IJ SOAJ injection INJECT 0.3 ML(1 SYRINGE) IN THE MUSCLE AS NEEDED FOR ALLERGIC REACTION 2 Device 0   furosemide (LASIX) 20 MG tablet 1 po qd 90 tablet 1   gabapentin (NEURONTIN) 300 MG capsule TAKE 1 CAPSULE(300 MG) po bid 60 capsule 2   ibuprofen (ADVIL) 800 MG tablet Take 1 tablet (800 mg total) by mouth every 8 (eight) hours as needed. 30  tablet 0   LORazepam (ATIVAN) 0.5 MG tablet TAKE 1 TABLET(0.5 MG) BY MOUTH AT BEDTIME AS NEEDED FOR SLEEP 30 tablet 1   meloxicam (MOBIC) 15 MG tablet Take 1 tablet (15 mg total) by mouth daily as needed for pain. 90 tablet 0   SYNTHROID 100 MCG tablet TAKE 1 TABLET BY MOUTH EVERY DAY 90 tablet 3   valACYclovir (VALTREX) 1000 MG tablet TAKE 1 TABLET BY MOUTH THREE TIMES DAILY AS NEEDED 30 tablet 2   No current facility-administered medications for this visit.    PHYSICAL EXAMINATION: ECOG PERFORMANCE STATUS: 1 - Symptomatic but completely ambulatory  Vitals:   04/29/20 1525  BP: (!) 146/75  Pulse: 71  Resp: 18  Temp: 97.8 F (36.6 C)  SpO2: 100%   Filed Weights   04/29/20 1525  Weight: 256 lb 11.2 oz (116.4 kg)       LABORATORY DATA:  I have reviewed the data as listed CMP Latest Ref Rng & Units 01/01/2020 05/19/2019 04/27/2019  Glucose 70 - 99 mg/dL 86 109(H) 110(H)  BUN 6 - 23 mg/dL 13 10 12   Creatinine 0.40 - 1.20 mg/dL 0.60 0.68 0.77  Sodium 135 - 145 mEq/L 140 142 144  Potassium 3.5 - 5.1 mEq/L 4.5 4.2 3.9  Chloride 96 - 112 mEq/L 103 110 109  CO2 19 - 32 mEq/L 32 22 27  Calcium 8.4 - 10.5 mg/dL 9.3 8.9 8.7(L)  Total Protein 6.0 - 8.3 g/dL 6.9 6.1(L) 6.2(L)  Total Bilirubin 0.2 - 1.2 mg/dL 0.8 0.7 0.8  Alkaline Phos 39 - 117 U/L 82 59 64  AST 0 - 37 U/L 14 21 17   ALT 0 - 35 U/L 10 21 17     Lab Results  Component Value Date   WBC 9.0 01/01/2020   HGB 12.8 01/01/2020   HCT 39.2 01/01/2020   MCV 84.9 01/01/2020   PLT 254.0 01/01/2020   NEUTROABS 6.5 01/01/2020    ASSESSMENT & PLAN:  Malignant neoplasm of upper-outer quadrant of left breast in female, estrogen receptor positive (HCC) Left lumpectomy: Grade 2 IDC with DCIS, 1 cm, margins negative, DCIS focally 0.1 cm from superior margin, 2/2 lymph nodes positive, ER 95%, PR 45%, HER-2 negative, Ki-67 20% AXLND: 3/20 LN Pos (total 5/22)  Treatment Plan: 1. Adj chemo with DD Adriamycin and Cytoxan  followed by Taxol weekly X 11 (stopped for neuropathy) completed 04/19/2019 2. Adj XRT 06/01/2019-07/21/2019 3. Adj Anti estrogen therapy --------------------------------------------------------------------------------------------- Current treatment: Anastrozole 1 mg daily started 07/26/2019 Anastrozole toxicities: Occasional hot flash but otherwise tolerating it fairly well. Trigger finger most likely caused by anastrozole.  She is seeing orthopedics.  Breast cancer surveillance: 1.  Breast exam 04/30/2019: Benign 2. Mammogram: I discussed with her that she needs to immediately schedule an appointment.  Return to clinic in 1 year for follow-up    No orders of the defined types were placed in this encounter.  The patient has a good understanding of the overall plan. she agrees with it. she will call with any problems that may develop before the next visit here.  Total time spent: 20 mins including face to face time and time spent for planning, charting and coordination of care  Nicholas Lose, MD 04/29/2020  I, Cloyde Reams Dorshimer, am acting as scribe for Dr. Nicholas Lose.  I have reviewed the above documentation for accuracy and completeness, and I agree with the above.

## 2020-04-29 ENCOUNTER — Other Ambulatory Visit: Payer: Self-pay

## 2020-04-29 ENCOUNTER — Inpatient Hospital Stay: Payer: BC Managed Care – PPO | Attending: Hematology and Oncology | Admitting: Hematology and Oncology

## 2020-04-29 ENCOUNTER — Telehealth: Payer: Self-pay | Admitting: Hematology and Oncology

## 2020-04-29 DIAGNOSIS — Z9221 Personal history of antineoplastic chemotherapy: Secondary | ICD-10-CM | POA: Insufficient documentation

## 2020-04-29 DIAGNOSIS — Z79811 Long term (current) use of aromatase inhibitors: Secondary | ICD-10-CM | POA: Insufficient documentation

## 2020-04-29 DIAGNOSIS — Z923 Personal history of irradiation: Secondary | ICD-10-CM | POA: Diagnosis not present

## 2020-04-29 DIAGNOSIS — Z79899 Other long term (current) drug therapy: Secondary | ICD-10-CM | POA: Diagnosis not present

## 2020-04-29 DIAGNOSIS — Z170 Estrogen receptor positive status [ER+]: Secondary | ICD-10-CM | POA: Insufficient documentation

## 2020-04-29 DIAGNOSIS — C50412 Malignant neoplasm of upper-outer quadrant of left female breast: Secondary | ICD-10-CM | POA: Insufficient documentation

## 2020-04-29 MED ORDER — ANASTROZOLE 1 MG PO TABS: 1.0000 mg | ORAL_TABLET | Freq: Every day | ORAL | 3 refills | Status: DC

## 2020-04-29 NOTE — Assessment & Plan Note (Signed)
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 Adj chemo with DD Adriamycin and Cytoxan followed by Taxol weekly X 11 (stopped for neuropathy) completed 04/19/2019 2. Adj XRT 06/01/2019-07/21/2019 3. Adj Anti estrogen therapy --------------------------------------------------------------------------------------------- Current treatment: Anastrozole 1 mg daily started 07/26/2019 Anastrozole toxicities:  Breast cancer surveillance: 1.  Breast exam 04/30/2019: Benign 2. Mammogram  Return to clinic in 1 year for follow-up

## 2020-04-29 NOTE — Telephone Encounter (Signed)
Scheduled appts per 9/13 los. Gave pt a print out of AVS.

## 2020-06-11 ENCOUNTER — Other Ambulatory Visit: Payer: Self-pay

## 2020-06-11 ENCOUNTER — Ambulatory Visit
Admission: RE | Admit: 2020-06-11 | Discharge: 2020-06-11 | Disposition: A | Discharge status: Home / Self Care | Payer: BC Managed Care – PPO | Source: Ambulatory Visit | Attending: Adult Health | Admitting: Adult Health

## 2020-06-11 DIAGNOSIS — C50412 Malignant neoplasm of upper-outer quadrant of left female breast: Secondary | ICD-10-CM

## 2020-06-11 DIAGNOSIS — Z170 Estrogen receptor positive status [ER+]: Secondary | ICD-10-CM

## 2020-06-11 HISTORY — DX: Malignant neoplasm of unspecified site of unspecified female breast: C50.919

## 2020-06-11 HISTORY — DX: Personal history of antineoplastic chemotherapy: Z92.21

## 2020-06-11 HISTORY — DX: Personal history of irradiation: Z92.3

## 2020-06-24 IMAGING — US ULTRASOUND LEFT BREAST LIMITED
1 series · 8 of 8 positions shown · non-contrast
Comparison: Previous exam(s).

CLINICAL DATA: Short-term follow-up for probably benign left breast
calcifications. Patient previously underwent biopsy of other left
breast calcifications, which were benign.

EXAM:
DIGITAL DIAGNOSTIC BILATERAL MAMMOGRAM WITH CAD AND TOMO
ULTRASOUND LEFT BREAST

[Series 1: ultrasound left breast limited · 0.06mm/px · 8 of 8 slices shown]
[im 1/8]
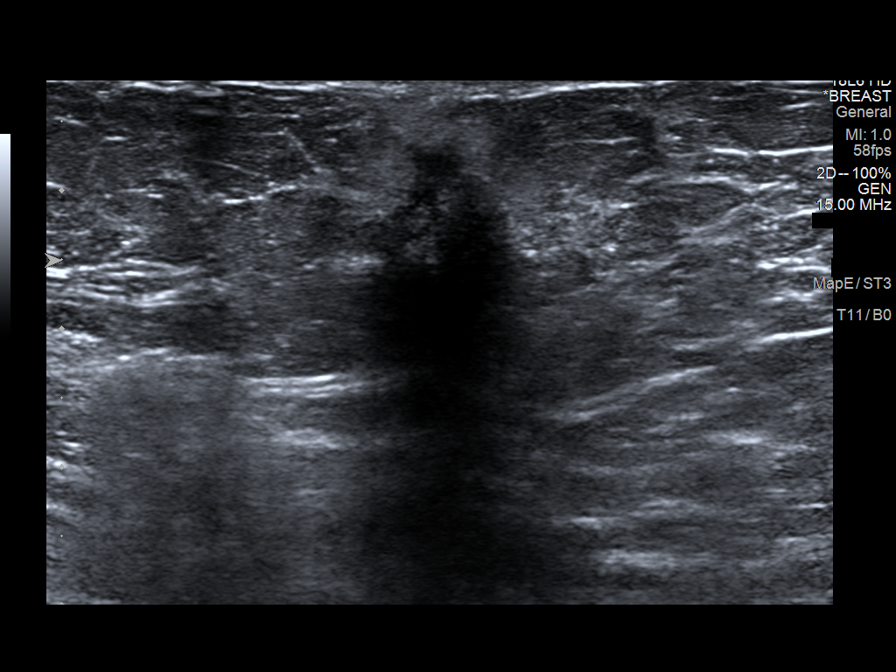
[im 2/8]
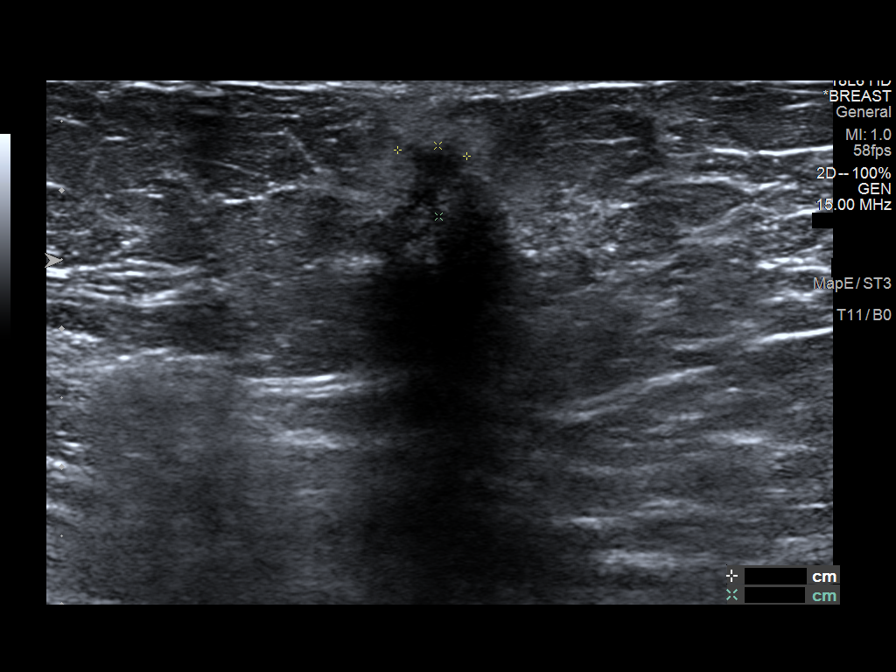
[im 3/8]
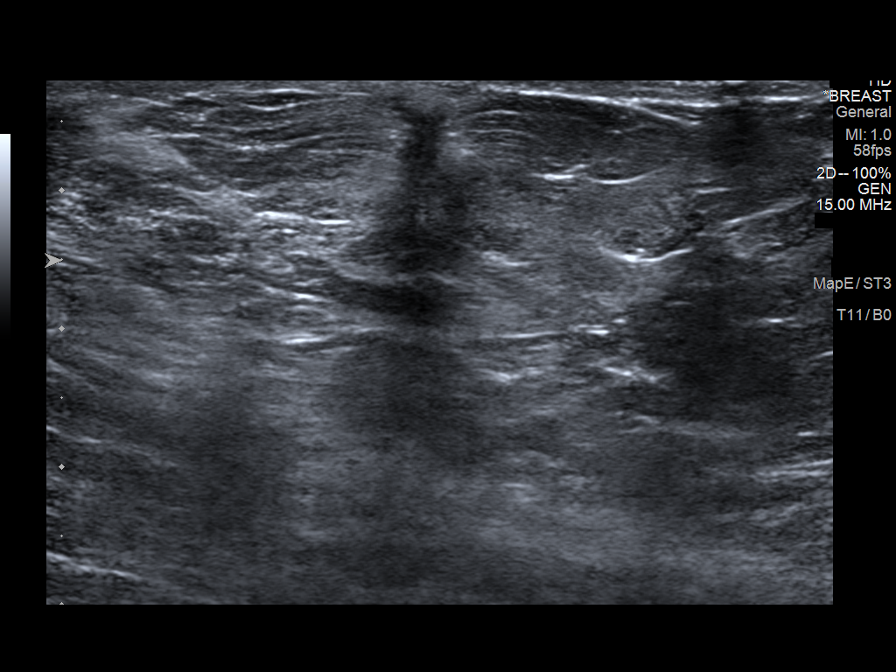
[im 4/8]
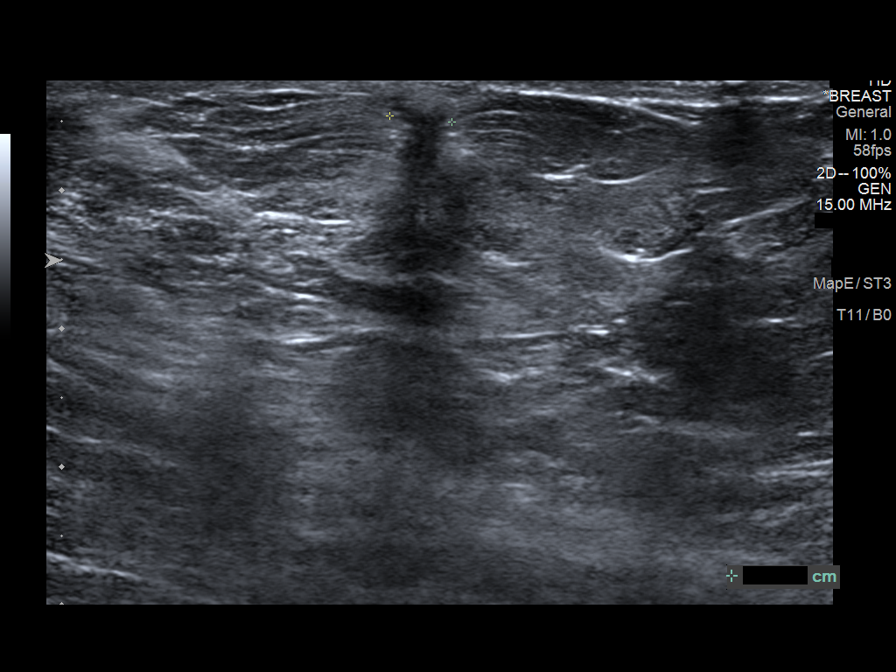
[im 5/8]
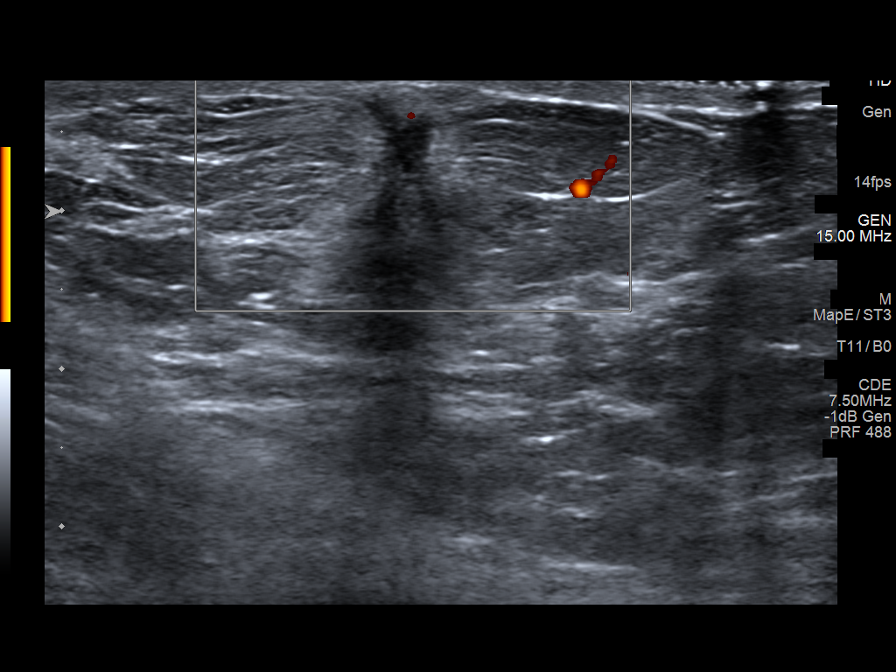
[im 6/8]
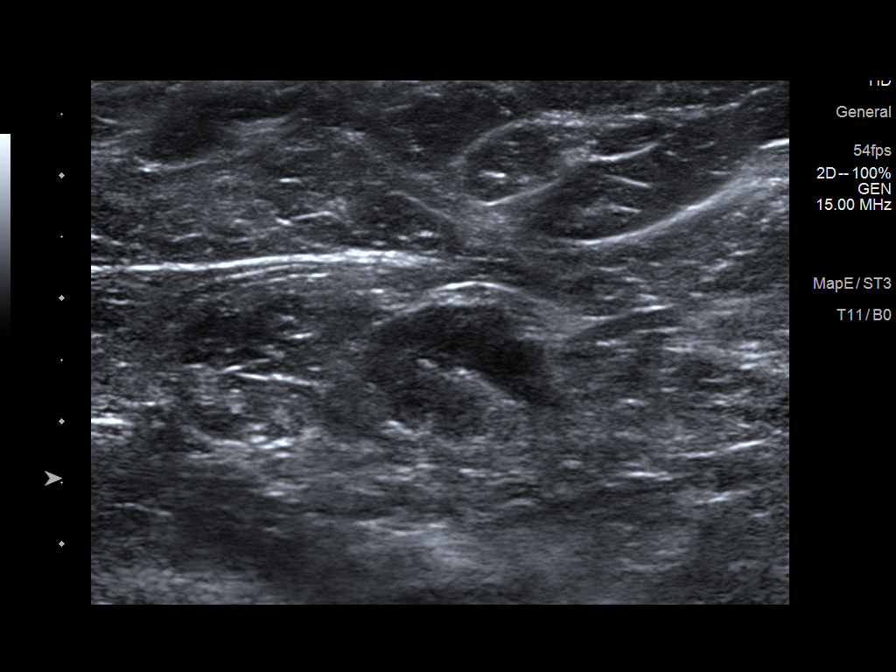
[im 7/8]
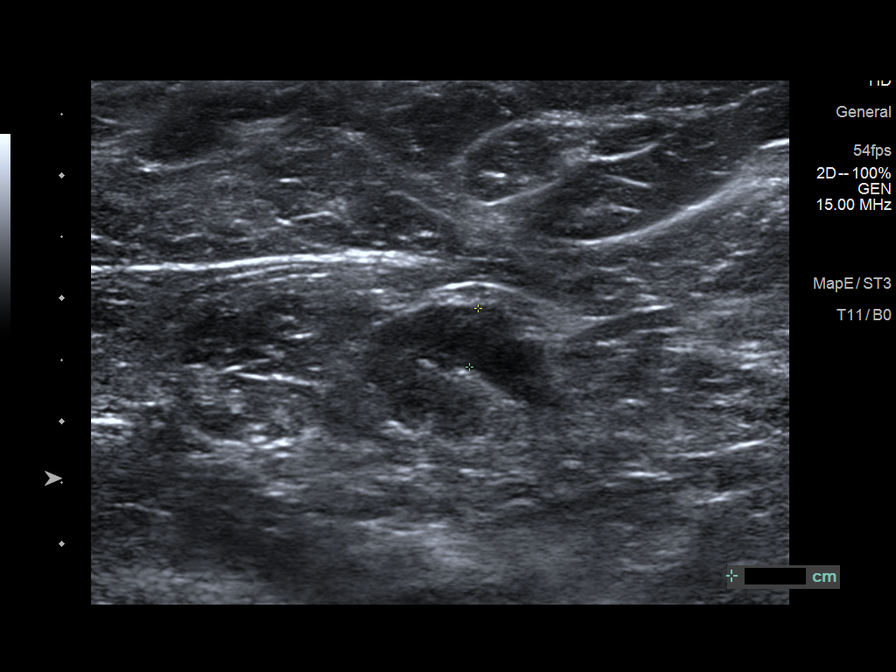
[im 8/8]
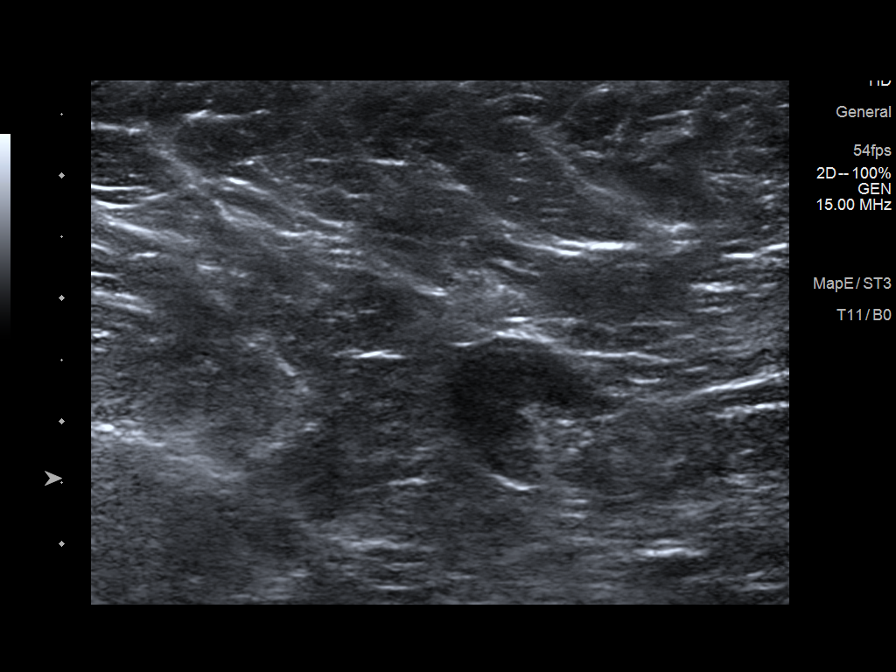

[8 of 8 positions shown; findings below may reference images not displayed]

ACR Breast Density Category b: There are scattered areas of
fibroglandular density.
FINDINGS: The probably benign small group of round and punctate calcifications
in the posterior left breast are stable. There are no new or
suspicious calcifications.

On the left, there is a small spiculated mass anteriorly, lateral to
midline.

There are no other discrete masses and no areas of architectural
distortion. No other mammographic changes.

Mammographic images were processed with CAD.

On physical exam, no mass is palpated in the lateral retroareolar
left breast.

Targeted ultrasound is performed, showing small hypoechoic irregular
shadowing mass, with a surrounding halo of increased echogenicity,
in the superficial aspect of the left breast at 2:30 o'clock, deep
to the areolar margin, 3 cm from the nipple. Mass measures 5 x 6 x 5
mm.

Sonographic evaluation of the left axilla demonstrates 1 abnormal
lymph node with a cortical thickness of 5 mm. It is normal in
overall size and shape. It has a thin stripe hilar fat.
IMPRESSION: 1. Small, 6 mm, suspicious mass in the left breast. Biopsy is
recommended.
2. Abnormal left axillary lymph node with a cortical thickness of 5
mm. Biopsy is recommended.
3. Benign left breast calcifications. No additional follow-up
imaging for these calcifications is indicated.

RECOMMENDATION:
1. Ultrasound-guided core needle biopsy of the 2:30 o'clock position
left breast mass.
2. Ultrasound guided core needle biopsy of the abnormal left
axillary lymph node.

I have discussed the findings and recommendations with the patient.
Results were also provided in writing at the conclusion of the
visit. If applicable, a reminder letter will be sent to the patient
regarding the next appointment.

BI-RADS CATEGORY  4: Suspicious.

## 2020-06-30 ENCOUNTER — Other Ambulatory Visit: Payer: Self-pay | Admitting: Family Medicine

## 2020-06-30 DIAGNOSIS — G629 Polyneuropathy, unspecified: Secondary | ICD-10-CM

## 2020-07-05 ENCOUNTER — Ambulatory Visit (INDEPENDENT_AMBULATORY_CARE_PROVIDER_SITE_OTHER): Payer: BC Managed Care – PPO | Admitting: Family Medicine

## 2020-07-05 ENCOUNTER — Other Ambulatory Visit: Payer: Self-pay

## 2020-07-05 ENCOUNTER — Encounter: Payer: Self-pay | Admitting: Family Medicine

## 2020-07-05 VITALS — BP 124/70 | HR 64 | Temp 99.1°F | Resp 18 | Ht 65.0 in | Wt 264.4 lb

## 2020-07-05 DIAGNOSIS — E039 Hypothyroidism, unspecified: Secondary | ICD-10-CM

## 2020-07-05 DIAGNOSIS — Z23 Encounter for immunization: Secondary | ICD-10-CM

## 2020-07-05 DIAGNOSIS — I10 Essential (primary) hypertension: Secondary | ICD-10-CM | POA: Diagnosis not present

## 2020-07-05 LAB — COMPREHENSIVE METABOLIC PANEL
ALT: 11 U/L (ref 0–35)
AST: 13 U/L (ref 0–37)
Albumin: 3.8 g/dL (ref 3.5–5.2)
Alkaline Phosphatase: 93 U/L (ref 39–117)
BUN: 17 mg/dL (ref 6–23)
CO2: 33 mEq/L — ABNORMAL HIGH (ref 19–32)
Calcium: 9.6 mg/dL (ref 8.4–10.5)
Chloride: 104 mEq/L (ref 96–112)
Creatinine, Ser: 0.61 mg/dL (ref 0.40–1.20)
GFR: 95.81 mL/min (ref >60.00)
Glucose, Bld: 101 mg/dL — ABNORMAL HIGH (ref 70–99)
Potassium: 4.9 mEq/L (ref 3.5–5.1)
Sodium: 143 mEq/L (ref 135–145)
Total Bilirubin: 0.9 mg/dL (ref 0.2–1.2)
Total Protein: 6.8 g/dL (ref 6.0–8.3)

## 2020-07-05 LAB — LIPID PANEL
Cholesterol: 186 mg/dL (ref 0–200)
HDL: 68.4 mg/dL (ref >39.00)
LDL Cholesterol: 108 mg/dL — ABNORMAL HIGH (ref 0–99)
NonHDL: 117.19
Total CHOL/HDL Ratio: 3
Triglycerides: 48 mg/dL (ref 0.0–149.0)
VLDL: 9.6 mg/dL (ref 0.0–40.0)

## 2020-07-05 LAB — TSH: TSH: 1.36 u[IU]/mL (ref 0.35–4.50)

## 2020-07-05 MED ORDER — AMLODIPINE BESYLATE 5 MG PO TABS: 5.0000 mg | ORAL_TABLET | Freq: Every day | ORAL | 1 refills | Status: DC

## 2020-07-05 NOTE — Assessment & Plan Note (Signed)
Check labs con't meds 

## 2020-07-05 NOTE — Patient Instructions (Signed)
DASH Eating Plan DASH stands for "Dietary Approaches to Stop Hypertension." The DASH eating plan is a healthy eating plan that has been shown to reduce high blood pressure (hypertension). It may also reduce your risk for type 2 diabetes, heart disease, and stroke. The DASH eating plan may also help with weight loss. What are tips for following this plan?  General guidelines  Avoid eating more than 2,300 mg (milligrams) of salt (sodium) a day. If you have hypertension, you may need to reduce your sodium intake to 1,500 mg a day.  Limit alcohol intake to no more than 1 drink a day for nonpregnant women and 2 drinks a day for men. One drink equals 12 oz of beer, 5 oz of wine, or 1 oz of hard liquor.  Work with your health care provider to maintain a healthy body weight or to lose weight. Ask what an ideal weight is for you.  Get at least 30 minutes of exercise that causes your heart to beat faster (aerobic exercise) most days of the week. Activities may include walking, swimming, or biking.  Work with your health care provider or diet and nutrition specialist (dietitian) to adjust your eating plan to your individual calorie needs. Reading food labels   Check food labels for the amount of sodium per serving. Choose foods with less than 5 percent of the Daily Value of sodium. Generally, foods with less than 300 mg of sodium per serving fit into this eating plan.  To find whole grains, look for the word "whole" as the first word in the ingredient list. Shopping  Buy products labeled as "low-sodium" or "no salt added."  Buy fresh foods. Avoid canned foods and premade or frozen meals. Cooking  Avoid adding salt when cooking. Use salt-free seasonings or herbs instead of table salt or sea salt. Check with your health care provider or pharmacist before using salt substitutes.  Do not fry foods. Cook foods using healthy methods such as baking, boiling, grilling, and broiling instead.  Cook with  heart-healthy oils, such as olive, canola, soybean, or sunflower oil. Meal planning  Eat a balanced diet that includes: ? 5 or more servings of fruits and vegetables each day. At each meal, try to fill half of your plate with fruits and vegetables. ? Up to 6-8 servings of whole grains each day. ? Less than 6 oz of lean meat, poultry, or fish each day. A 3-oz serving of meat is about the same size as a deck of cards. One egg equals 1 oz. ? 2 servings of low-fat dairy each day. ? A serving of nuts, seeds, or beans 5 times each week. ? Heart-healthy fats. Healthy fats called Omega-3 fatty acids are found in foods such as flaxseeds and coldwater fish, like sardines, salmon, and mackerel.  Limit how much you eat of the following: ? Canned or prepackaged foods. ? Food that is high in trans fat, such as fried foods. ? Food that is high in saturated fat, such as fatty meat. ? Sweets, desserts, sugary drinks, and other foods with added sugar. ? Full-fat dairy products.  Do not salt foods before eating.  Try to eat at least 2 vegetarian meals each week.  Eat more home-cooked food and less restaurant, buffet, and fast food.  When eating at a restaurant, ask that your food be prepared with less salt or no salt, if possible. What foods are recommended? The items listed may not be a complete list. Talk with your dietitian about   what dietary choices are best for you. Grains Whole-grain or whole-wheat bread. Whole-grain or whole-wheat pasta. Brown rice. Oatmeal. Quinoa. Bulgur. Whole-grain and low-sodium cereals. Pita bread. Low-fat, low-sodium crackers. Whole-wheat flour tortillas. Vegetables Fresh or frozen vegetables (raw, steamed, roasted, or grilled). Low-sodium or reduced-sodium tomato and vegetable juice. Low-sodium or reduced-sodium tomato sauce and tomato paste. Low-sodium or reduced-sodium canned vegetables. Fruits All fresh, dried, or frozen fruit. Canned fruit in natural juice (without  added sugar). Meat and other protein foods Skinless chicken or turkey. Ground chicken or turkey. Pork with fat trimmed off. Fish and seafood. Egg whites. Dried beans, peas, or lentils. Unsalted nuts, nut butters, and seeds. Unsalted canned beans. Lean cuts of beef with fat trimmed off. Low-sodium, lean deli meat. Dairy Low-fat (1%) or fat-free (skim) milk. Fat-free, low-fat, or reduced-fat cheeses. Nonfat, low-sodium ricotta or cottage cheese. Low-fat or nonfat yogurt. Low-fat, low-sodium cheese. Fats and oils Soft margarine without trans fats. Vegetable oil. Low-fat, reduced-fat, or light mayonnaise and salad dressings (reduced-sodium). Canola, safflower, olive, soybean, and sunflower oils. Avocado. Seasoning and other foods Herbs. Spices. Seasoning mixes without salt. Unsalted popcorn and pretzels. Fat-free sweets. What foods are not recommended? The items listed may not be a complete list. Talk with your dietitian about what dietary choices are best for you. Grains Baked goods made with fat, such as croissants, muffins, or some breads. Dry pasta or rice meal packs. Vegetables Creamed or fried vegetables. Vegetables in a cheese sauce. Regular canned vegetables (not low-sodium or reduced-sodium). Regular canned tomato sauce and paste (not low-sodium or reduced-sodium). Regular tomato and vegetable juice (not low-sodium or reduced-sodium). Pickles. Olives. Fruits Canned fruit in a light or heavy syrup. Fried fruit. Fruit in cream or butter sauce. Meat and other protein foods Fatty cuts of meat. Ribs. Fried meat. Bacon. Sausage. Bologna and other processed lunch meats. Salami. Fatback. Hotdogs. Bratwurst. Salted nuts and seeds. Canned beans with added salt. Canned or smoked fish. Whole eggs or egg yolks. Chicken or turkey with skin. Dairy Whole or 2% milk, cream, and half-and-half. Whole or full-fat cream cheese. Whole-fat or sweetened yogurt. Full-fat cheese. Nondairy creamers. Whipped toppings.  Processed cheese and cheese spreads. Fats and oils Butter. Stick margarine. Lard. Shortening. Ghee. Bacon fat. Tropical oils, such as coconut, palm kernel, or palm oil. Seasoning and other foods Salted popcorn and pretzels. Onion salt, garlic salt, seasoned salt, table salt, and sea salt. Worcestershire sauce. Tartar sauce. Barbecue sauce. Teriyaki sauce. Soy sauce, including reduced-sodium. Steak sauce. Canned and packaged gravies. Fish sauce. Oyster sauce. Cocktail sauce. Horseradish that you find on the shelf. Ketchup. Mustard. Meat flavorings and tenderizers. Bouillon cubes. Hot sauce and Tabasco sauce. Premade or packaged marinades. Premade or packaged taco seasonings. Relishes. Regular salad dressings. Where to find more information:  National Heart, Lung, and Blood Institute: www.nhlbi.nih.gov  American Heart Association: www.heart.org Summary  The DASH eating plan is a healthy eating plan that has been shown to reduce high blood pressure (hypertension). It may also reduce your risk for type 2 diabetes, heart disease, and stroke.  With the DASH eating plan, you should limit salt (sodium) intake to 2,300 mg a day. If you have hypertension, you may need to reduce your sodium intake to 1,500 mg a day.  When on the DASH eating plan, aim to eat more fresh fruits and vegetables, whole grains, lean proteins, low-fat dairy, and heart-healthy fats.  Work with your health care provider or diet and nutrition specialist (dietitian) to adjust your eating plan to your   individual calorie needs. This information is not intended to replace advice given to you by your health care provider. Make sure you discuss any questions you have with your health care provider. Document Revised: 07/16/2017 Document Reviewed: 07/27/2016 Elsevier Patient Education  2020 Elsevier Inc.  

## 2020-07-05 NOTE — Assessment & Plan Note (Addendum)
Well controlled, no changes to meds. Encouraged heart healthy diet such as the DASH diet and exercise as tolerated.  con't amlodapine and coreg

## 2020-07-05 NOTE — Progress Notes (Signed)
Patient ID: Kayla Banks, female    DOB: [birth year]  Age: 64 y.o. MRN: [id number]    Subjective:  Subjective  HPI Nashia Remus presents for f/u bp and hypothyroidism     No refills needed  Review of Systems  Constitutional: Negative for appetite change, diaphoresis, fatigue and unexpected weight change.  Eyes: Negative for pain, redness and visual disturbance.  Respiratory: Negative for cough, chest tightness, shortness of breath and wheezing.   Cardiovascular: Negative for chest pain, palpitations and leg swelling.  Endocrine: Negative for cold intolerance, heat intolerance, polydipsia, polyphagia and polyuria.  Genitourinary: Negative for difficulty urinating, dysuria and frequency.  Neurological: Negative for dizziness, light-headedness, numbness and headaches.    History Past Medical History:  Diagnosis Date  . Breast cancer ([place])   . Complication of anesthesia   . Family history of breast cancer   . Heart murmur    no problems, saw cardiologist 2010  . Hypertension    on meds  . Hypothyroidism   . Osteoarthritis   . Personal history of chemotherapy   . Personal history of radiation therapy    left  . Plantar fasciitis   . PONV (postoperative nausea and vomiting)   . Thyroid disease    Hypothyroidism    She has a past surgical history that includes Lumbar laminectomy (1990); Foot surgery (Left); Abdominal hysterectomy (02/2012); Breast lumpectomy with radioactive seed and sentinel lymph node biopsy (Left, 10/11/2018); Axillary lymph node dissection (Left, 11/09/2018); Portacath placement (Right, 12/01/2018); Port-a-cath removal (N/A, 05/23/2019); Breast biopsy (Left); Breast lumpectomy (Left, 10/11/2018); and Cataract extraction (Bilateral, 11/3  06/26/20).   Her family history includes Breast cancer in her maternal aunt and another family member; Diabetes in her mother; Heart disease (age of onset: 10) in her father; Hyperlipidemia in her mother; Hypertension in her  father and mother; Hyperthyroidism in her sister; Hypothyroidism in her sister.She reports that she has never smoked. She has never used smokeless tobacco. She reports current alcohol use. She reports that she does not use drugs.  Current Outpatient Medications on File Prior to Visit  Medication Sig Dispense Refill  . anastrozole (ARIMIDEX) 1 MG tablet Take 1 tablet (1 mg total) by mouth daily. 90 tablet 3  . carvedilol (COREG) 25 MG tablet TAKE 1 TABLET(25 MG) BY MOUTH TWICE DAILY WITH A MEAL. 180 tablet 1  . EPINEPHRINE 0.3 mg/0.3 mL IJ SOAJ injection INJECT 0.3 ML(1 SYRINGE) IN THE MUSCLE AS NEEDED FOR ALLERGIC REACTION 2 Device 0  . furosemide (LASIX) 20 MG tablet 1 po qd 90 tablet 1  . gabapentin (NEURONTIN) 300 MG capsule Take 1 capsule (300 mg total) by mouth 2 (two) times daily. 180 capsule 0  . ibuprofen (ADVIL) 800 MG tablet Take 1 tablet (800 mg total) by mouth every 8 (eight) hours as needed. 30 tablet 0  . LORazepam (ATIVAN) 0.5 MG tablet TAKE 1 TABLET(0.5 MG) BY MOUTH AT BEDTIME AS NEEDED FOR SLEEP 30 tablet 1  . meloxicam (MOBIC) 15 MG tablet Take 1 tablet (15 mg total) by mouth daily as needed for pain. 90 tablet 0  . ofloxacin (OCUFLOX) 0.3 % ophthalmic solution Instill one drop TID in operative eye(s) starting 2 days prior to surgery and 3 weeks following surgery    . prednisoLONE acetate (PRED FORTE) 1 % ophthalmic suspension Instill 1 drop TID in operative eye(s) starting 2 days prior to surgery and 3 weeks following surgery    . SYNTHROID 100 MCG tablet TAKE 1 TABLET BY MOUTH EVERY DAY  90 tablet 3  . valACYclovir (VALTREX) 1000 MG tablet TAKE 1 TABLET BY MOUTH THREE TIMES DAILY AS NEEDED 30 tablet 2   No current facility-administered medications on file prior to visit.     Objective:  Objective  Physical Exam Vitals and nursing note reviewed.  Constitutional:      Appearance: She is well-developed.  HENT:     Head: Normocephalic and atraumatic.  Eyes:      Conjunctiva/sclera: Conjunctivae normal.  Neck:     Thyroid: No thyromegaly.     Vascular: No carotid bruit or JVD.  Cardiovascular:     Rate and Rhythm: Normal rate and regular rhythm.     Heart sounds: Normal heart sounds. No murmur heard.   Pulmonary:     Effort: Pulmonary effort is normal. No respiratory distress.     Breath sounds: Normal breath sounds. No wheezing or rales.  Chest:     Chest wall: No tenderness.  Musculoskeletal:     Cervical back: Normal range of motion and neck supple.  Neurological:     Mental Status: She is alert and oriented to person, place, and time.    BP 124/70 (BP Location: Left Arm, Patient Position: Sitting, Cuff Size: Large)   Pulse 64   Temp 99.1 F (37.3 C) (Oral)   Resp 18   Ht 5\' 5"  (1.651 m)   Wt 264 lb 6.4 oz (119.9 kg)   LMP 01/28/2012   SpO2 99%   BMI 44.00 kg/m  Wt Readings from Last 3 Encounters:  07/05/20 264 lb 6.4 oz (119.9 kg)  04/29/20 256 lb 11.2 oz (116.4 kg)  01/01/20 253 lb 9.6 oz (115 kg)     Lab Results  Component Value Date   WBC 9.0 01/01/2020   HGB 12.8 01/01/2020   HCT 39.2 01/01/2020   PLT 254.0 01/01/2020   GLUCOSE 86 01/01/2020   CHOL 205 (H) 01/01/2020   TRIG 55.0 01/01/2020   HDL 67.20 01/01/2020   LDLCALC 127 (H) 01/01/2020   ALT 10 01/01/2020   AST 14 01/01/2020   NA 140 01/01/2020   K 4.5 01/01/2020   CL 103 01/01/2020   CREATININE 0.60 01/01/2020   BUN 13 01/01/2020   CO2 32 01/01/2020   TSH 2.61 01/01/2020   HGBA1C 6.0 11/04/2015    MM DIAG BREAST TOMO BILATERAL  Result Date: 06/11/2020 CLINICAL DATA:  Left lumpectomy.  Annual mammography. EXAM: DIGITAL DIAGNOSTIC BILATERAL MAMMOGRAM WITH TOMO AND CAD COMPARISON:  Previous exam(s). ACR Breast Density Category b: There are scattered areas of fibroglandular density. FINDINGS: No suspicious masses, calcifications, or distortion are identified. The left lumpectomy site appears as expected. Mammographic images were processed with CAD.  IMPRESSION: No mammographic evidence of malignancy. RECOMMENDATION: Annual diagnostic mammography. I have discussed the findings and recommendations with the patient. If applicable, a reminder letter will be sent to the patient regarding the next appointment. BI-RADS CATEGORY  2: Benign. Electronically Signed   By: Dorise Bullion III M.D   On: 06/11/2020 10:54     Assessment & Plan:  Plan  I am having Youlanda Parham "Kieth Brightly" maintain her EPINEPHrine, LORazepam, ibuprofen, furosemide, carvedilol, valACYclovir, Synthroid, meloxicam, anastrozole, gabapentin, ofloxacin, prednisoLONE acetate, and amLODipine.  Meds ordered this encounter  Medications  . amLODipine (NORVASC) 5 MG tablet    Sig: Take 1 tablet (5 mg total) by mouth daily. Needs ov before any more refills    Dispense:  90 tablet    Refill:  1    Problem  List Items Addressed This Visit      Unprioritized   Essential hypertension    Well controlled, no changes to meds. Encouraged heart healthy diet such as the DASH diet and exercise as tolerated.  con't amlodapine and coreg       Relevant Medications   amLODipine (NORVASC) 5 MG tablet   Hypothyroidism    Check labs  con't meds      Relevant Orders   TSH   Lipid panel   Comprehensive metabolic panel   MORBID OBESITY   Relevant Orders   Amb Ref to Medical Weight Management    Other Visit Diagnoses    Primary hypertension    -  Primary   Relevant Medications   amLODipine (NORVASC) 5 MG tablet   Other Relevant Orders   TSH   Lipid panel   Comprehensive metabolic panel   Need for influenza vaccination       Relevant Orders   Flu Vaccine QUAD 6+ mos PF IM (Fluarix Quad PF) (Completed)      Follow-up: Return in about 6 months (around 01/02/2021), or if symptoms worsen or fail to improve, for fasting, annual exam.  Ann Held, DO

## 2020-09-06 IMAGING — DX BREAST SURGICAL SPECIMEN
1 series · 2 of 2 positions shown · non-contrast
Comparison: Previous exam(s).

CLINICAL DATA: Evaluate specimen

EXAM:
SPECIMEN RADIOGRAPH OF THE LEFT BREAST

[Series 2: specimen digital x-ray, derived · left · 2 of 2 slices shown]
[im 1/2]
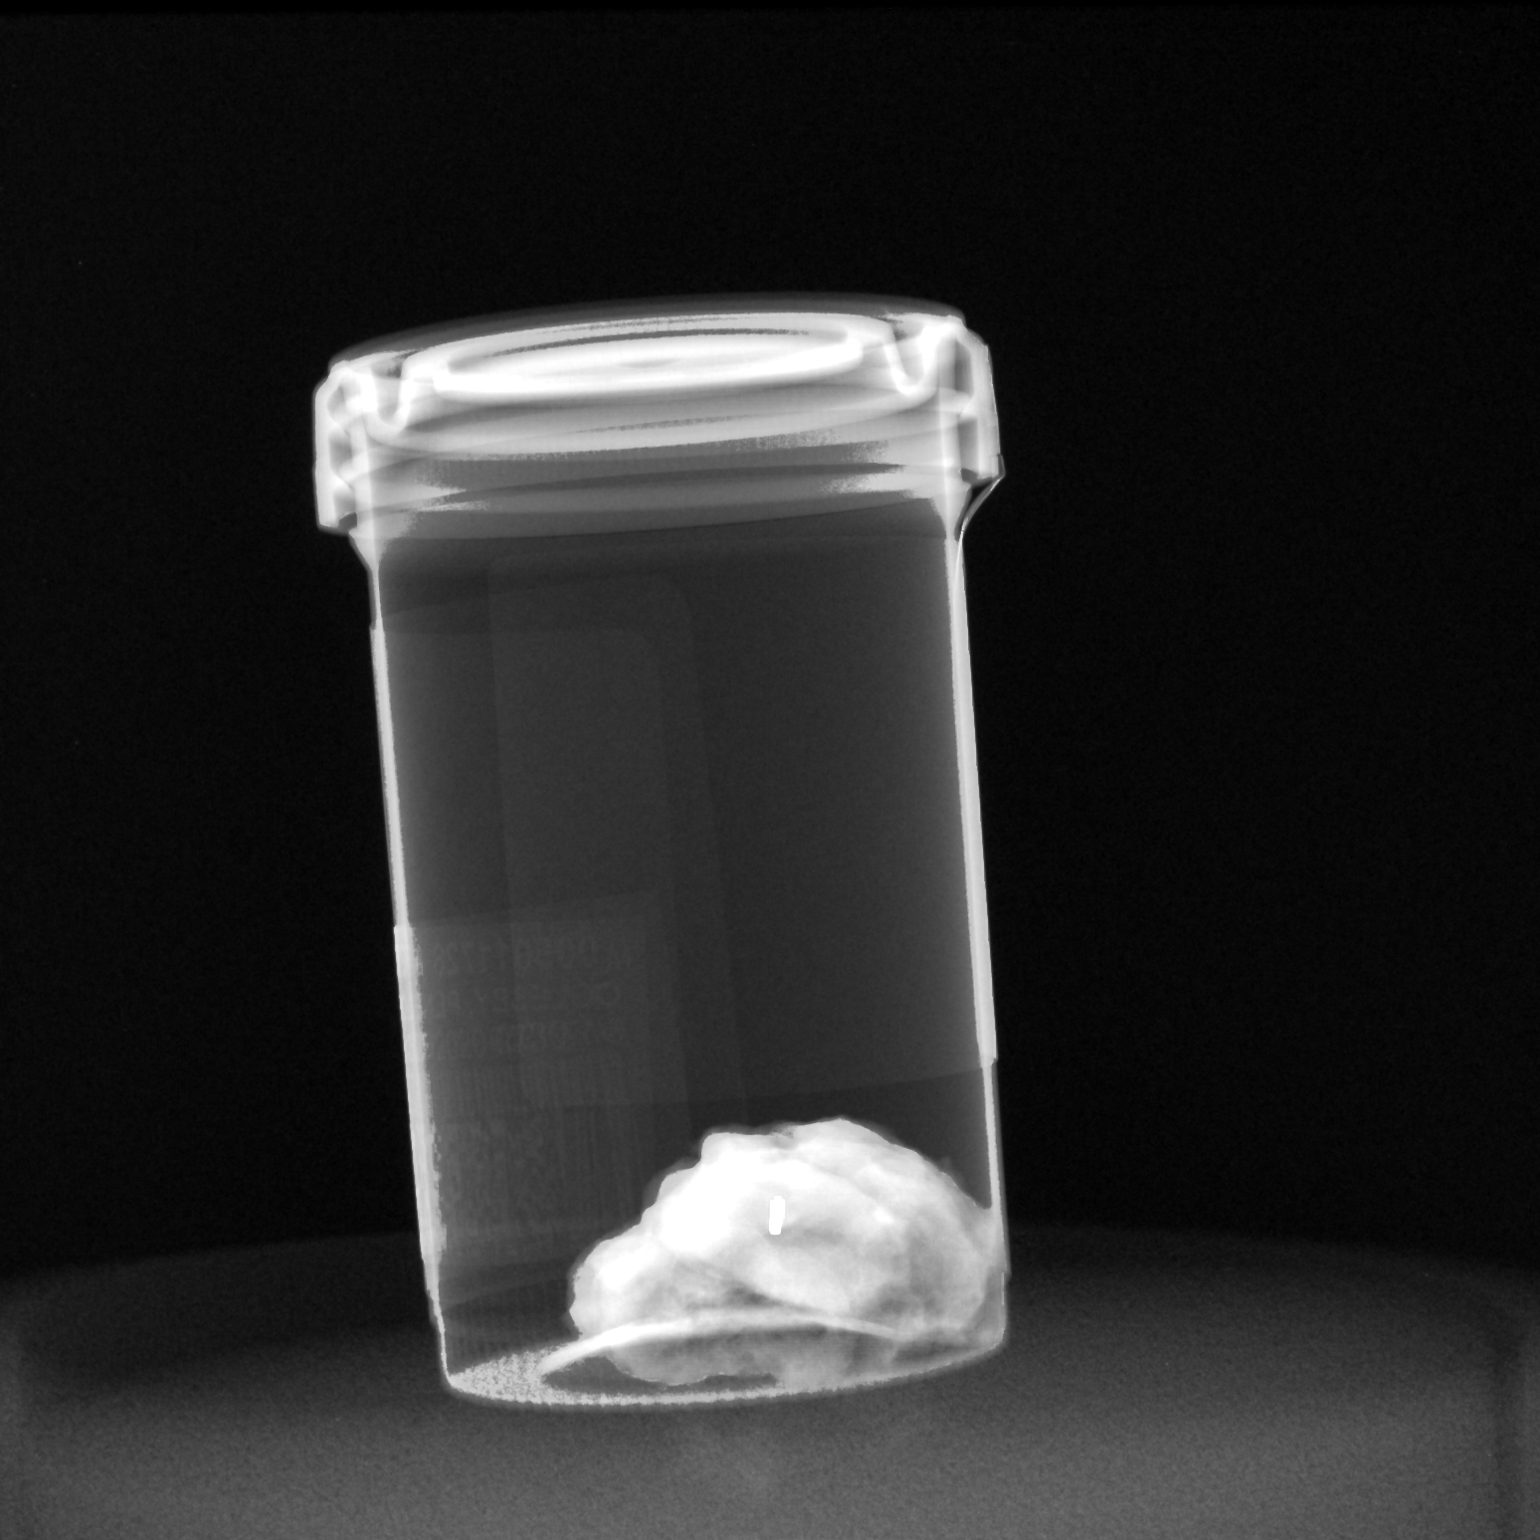
[im 2/2]
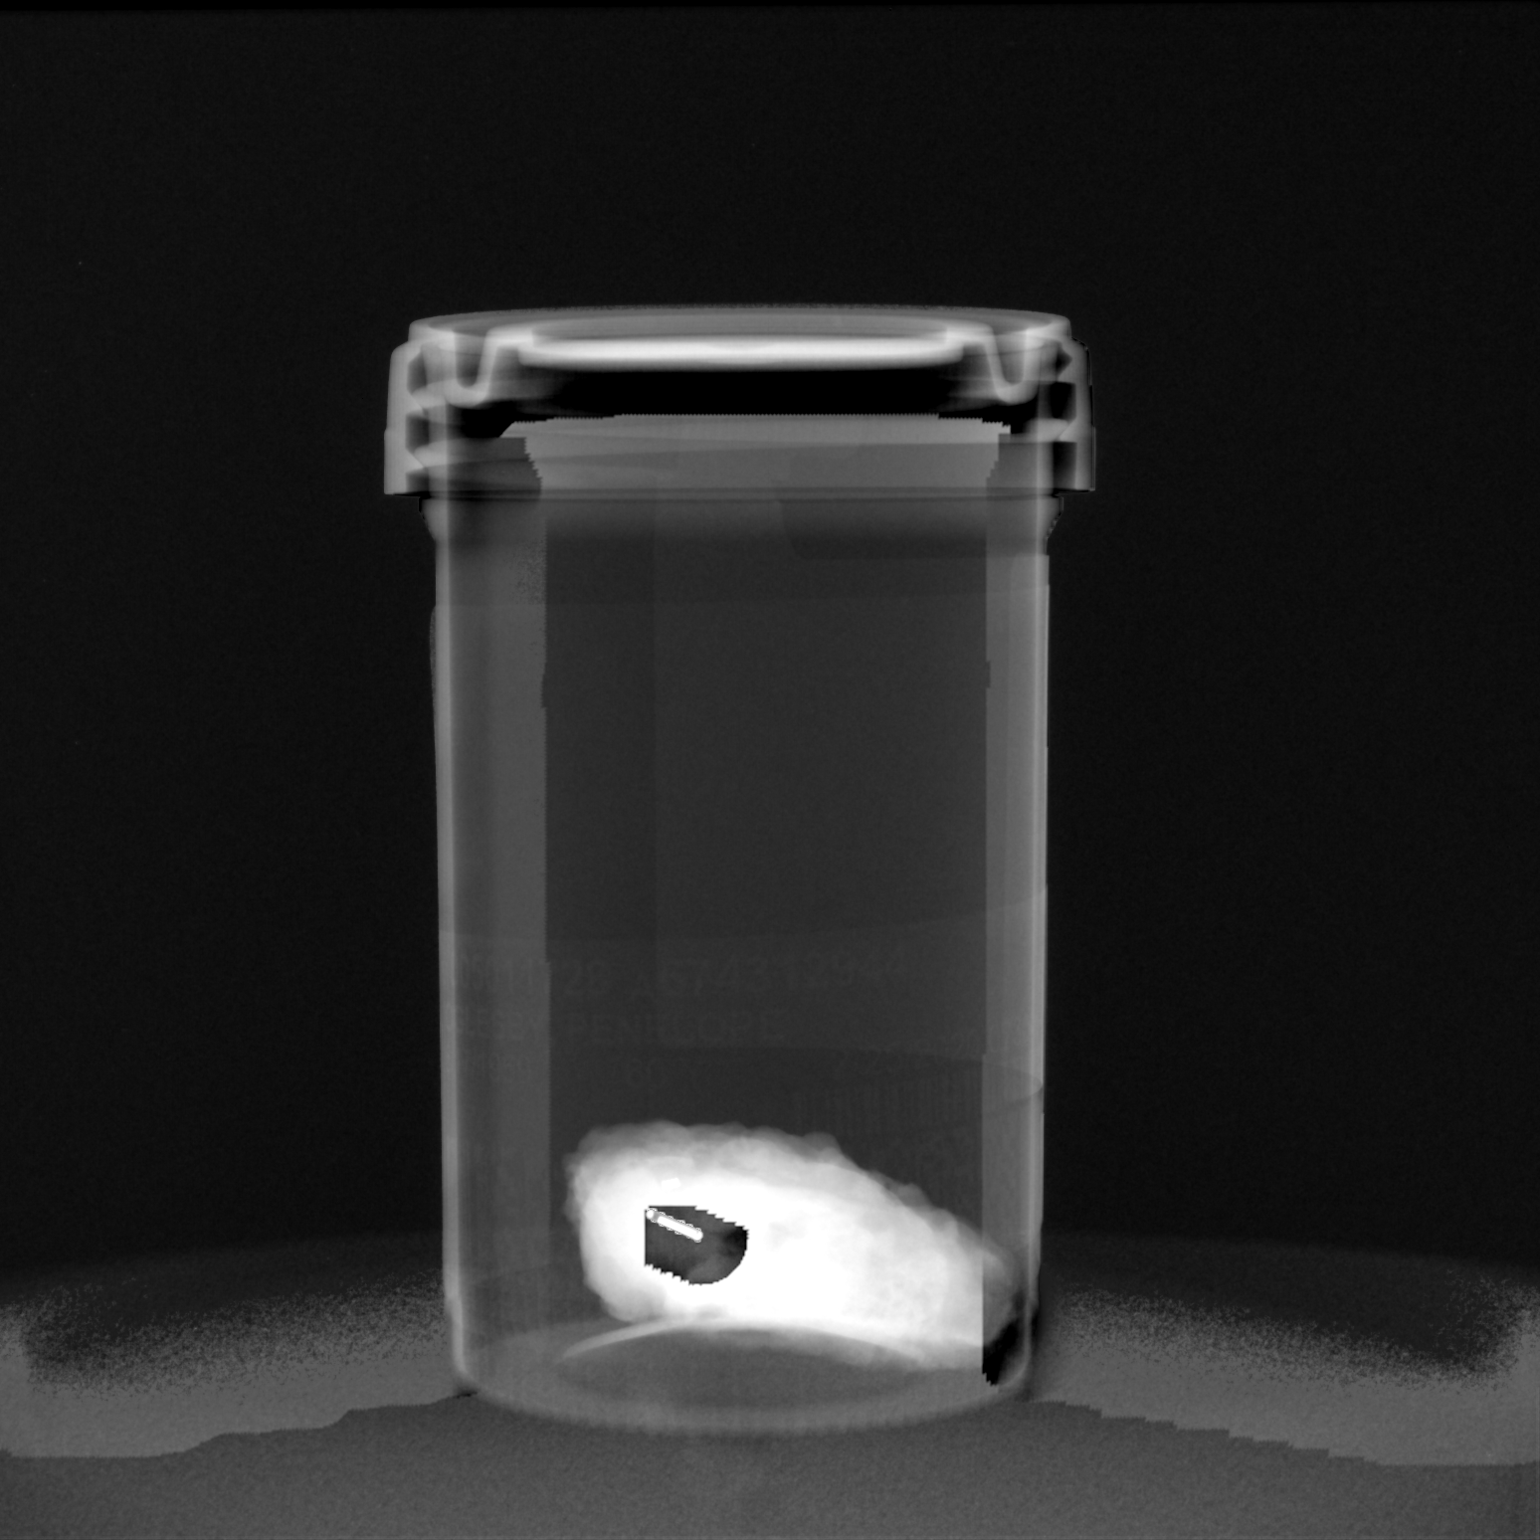

[2 of 2 positions shown; findings below may reference images not displayed]

FINDINGS: Status post excision of the left breast. The radioactive seed and
biopsy marker clip are present, completely intact, and were marked
for pathology.
IMPRESSION: Specimen radiograph of the left breast.

## 2020-09-06 IMAGING — DX BREAST SURGICAL SPECIMEN
1 series · 2 of 2 positions shown · non-contrast
Comparison: Previous exam(s).

CLINICAL DATA: Evaluate specimen

EXAM:
SPECIMEN RADIOGRAPH OF THE LEFT AXILLA

[Series 2: specimen digital x-ray, derived · left · 2 of 2 slices shown]
[im 1/2]
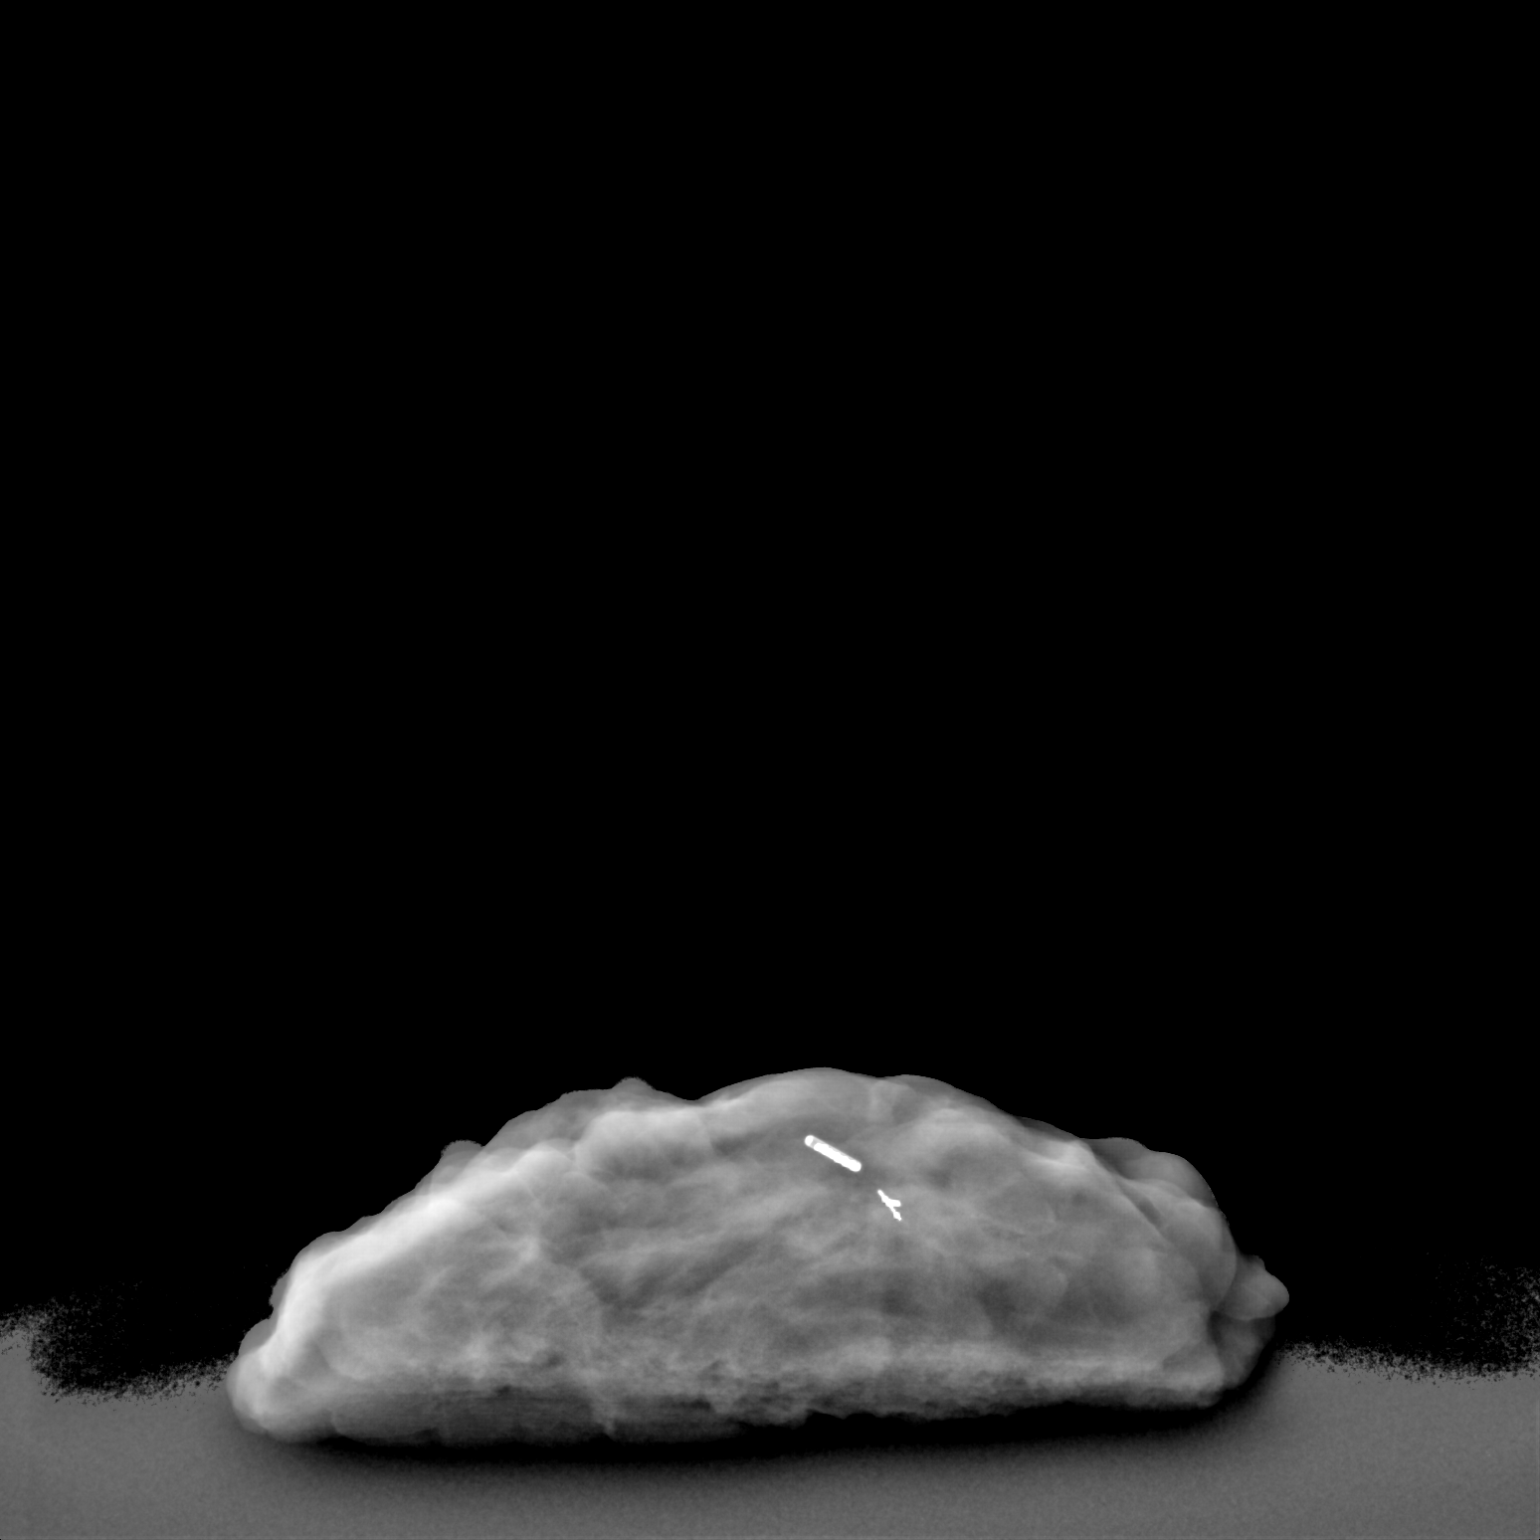
[im 2/2]
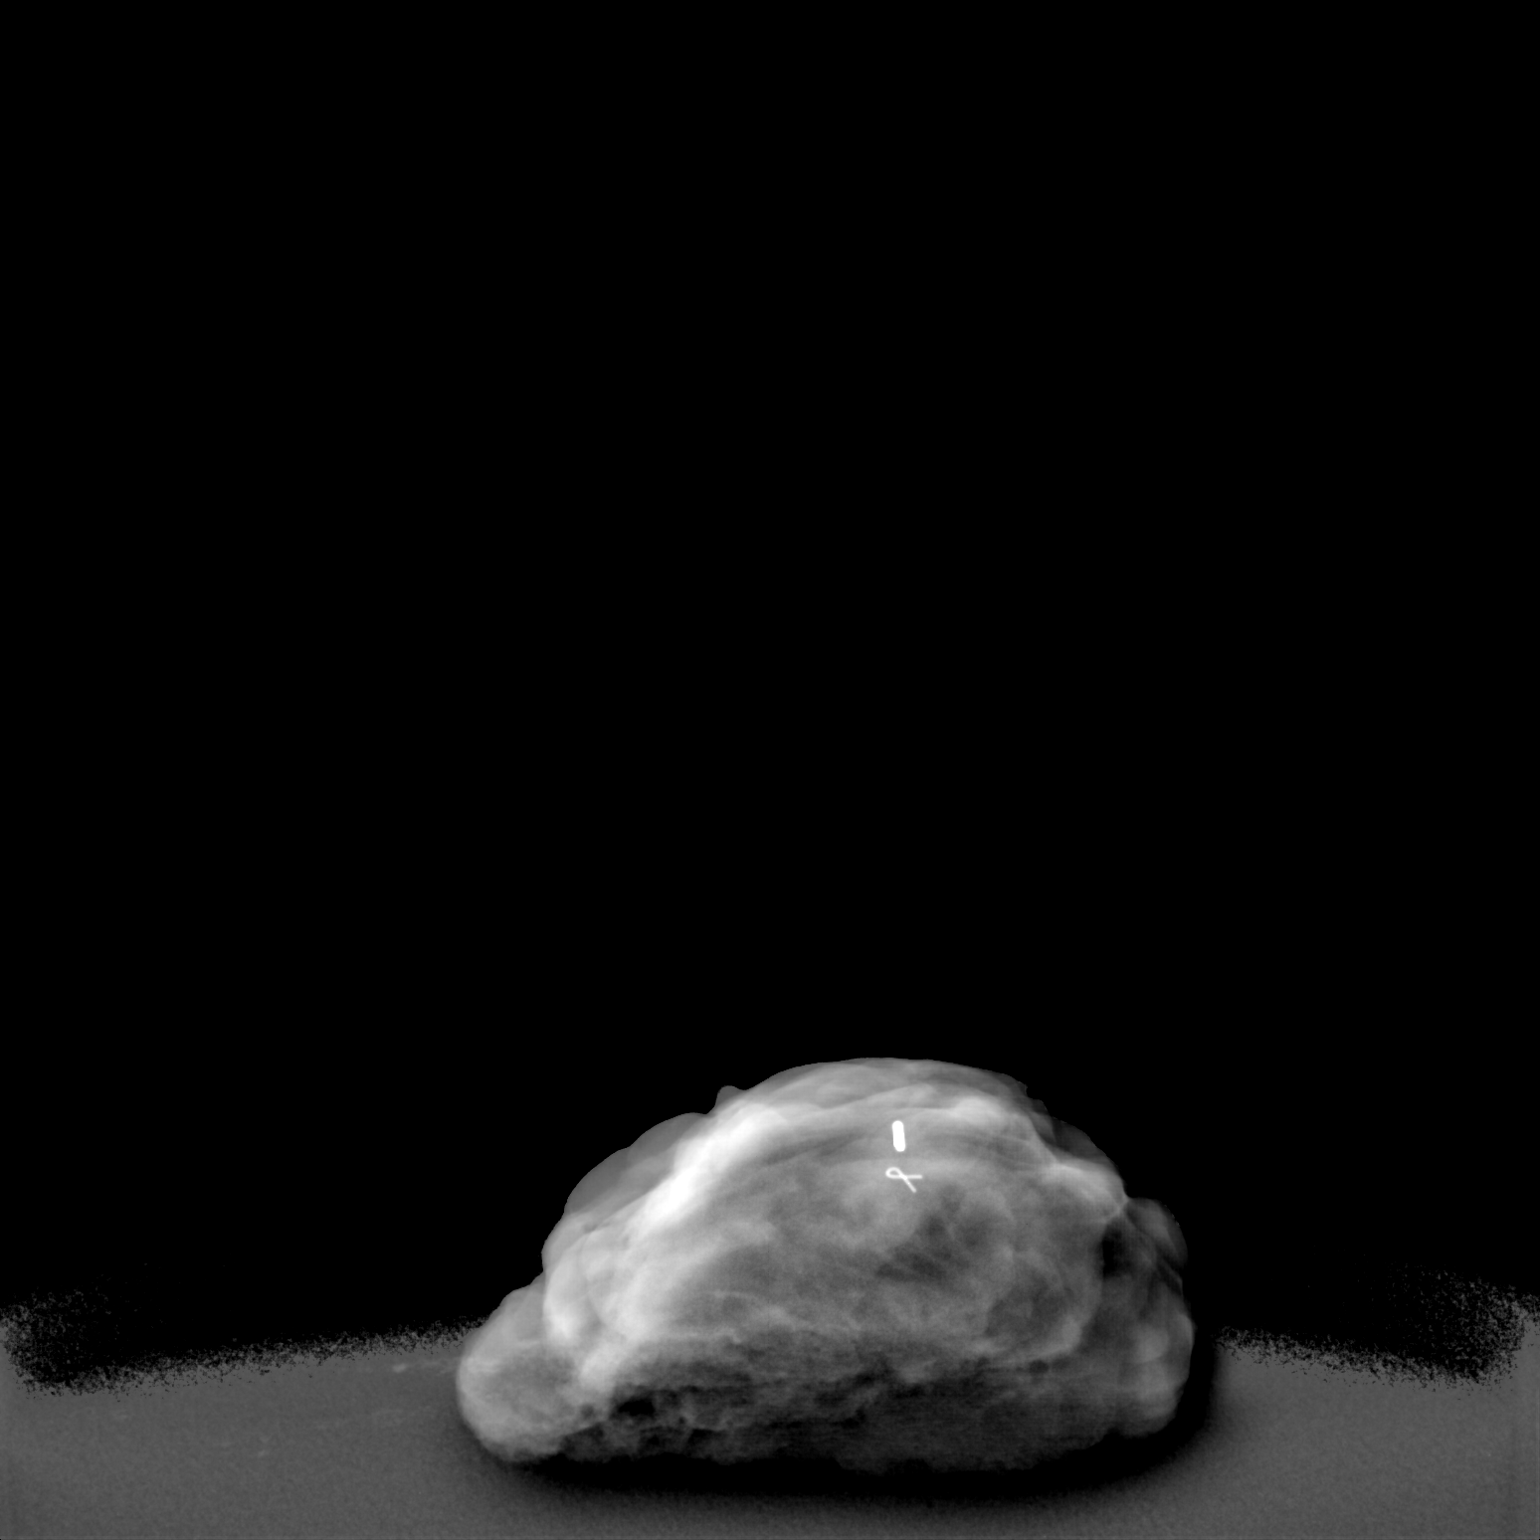

[2 of 2 positions shown; findings below may reference images not displayed]

FINDINGS: Status post excision of the left axilla. The radioactive seed and
biopsy marker clip are present, completely intact, and were marked
for pathology.
IMPRESSION: Specimen radiograph of the left axilla.

## 2020-10-11 ENCOUNTER — Other Ambulatory Visit: Payer: Self-pay | Admitting: Family Medicine

## 2020-10-11 DIAGNOSIS — I10 Essential (primary) hypertension: Secondary | ICD-10-CM

## 2020-10-11 DIAGNOSIS — M170 Bilateral primary osteoarthritis of knee: Secondary | ICD-10-CM

## 2020-10-27 IMAGING — CR PORTABLE CHEST - 1 VIEW
1 series · 1 of 1 positions shown · non-contrast
Comparison: None.

CLINICAL DATA: Port-A-Cath placement.

EXAM:
PORTABLE CHEST 1 VIEW

[chest ap]
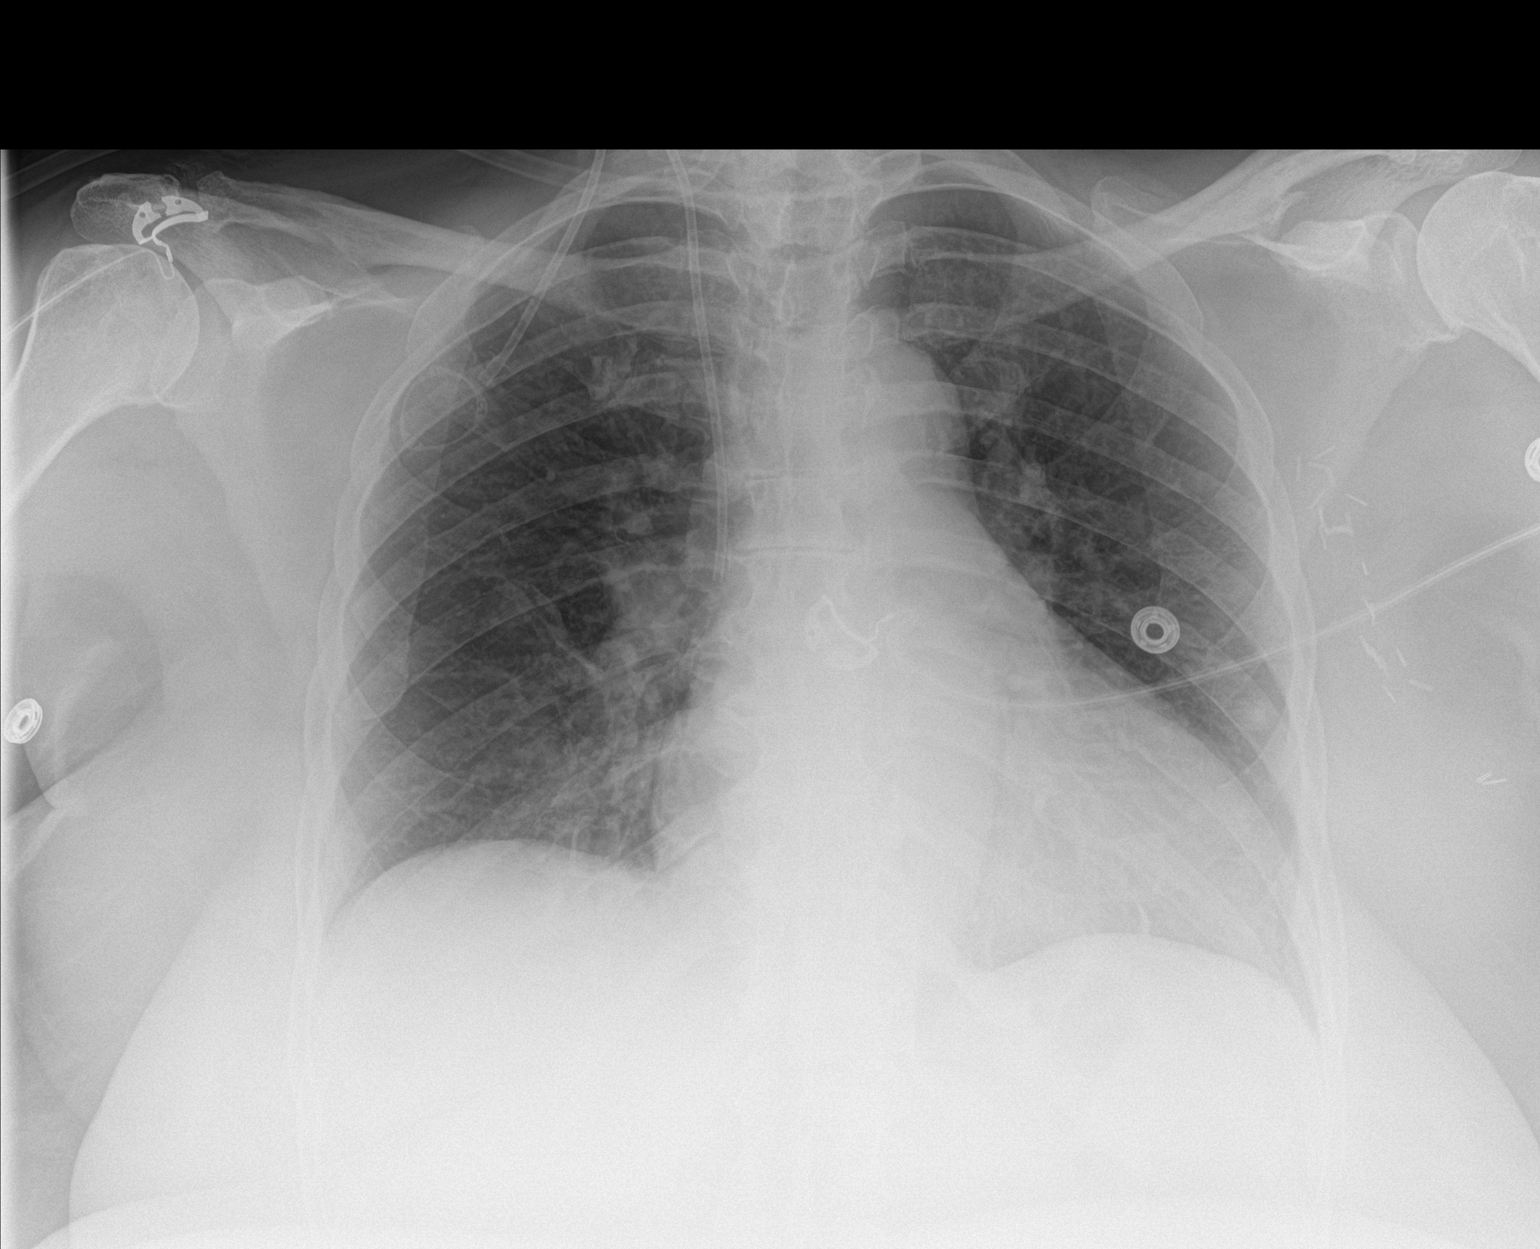

[1 of 1 positions shown; findings below may reference images not displayed]

FINDINGS: Mild cardiomegaly is noted. Right internal jugular Port-A-Cath is
noted with distal tip in expected position of the SVC. No
pneumothorax or pleural effusion is noted. Both lungs are clear. The
visualized skeletal structures are unremarkable.
IMPRESSION: Right internal jugular Port-A-Cath is noted with distal tip in
expected position of the SVC. No pneumothorax is noted.

## 2020-12-09 ENCOUNTER — Other Ambulatory Visit: Payer: Self-pay

## 2020-12-09 ENCOUNTER — Telehealth: Payer: Self-pay | Admitting: Family Medicine

## 2020-12-09 DIAGNOSIS — I10 Essential (primary) hypertension: Secondary | ICD-10-CM

## 2020-12-09 DIAGNOSIS — G629 Polyneuropathy, unspecified: Secondary | ICD-10-CM

## 2020-12-09 MED ORDER — AMLODIPINE BESYLATE 5 MG PO TABS: 5.0000 mg | ORAL_TABLET | Freq: Every day | ORAL | 1 refills | Status: DC

## 2020-12-09 MED ORDER — GABAPENTIN 300 MG PO CAPS: 300.0000 mg | ORAL_CAPSULE | Freq: Two times a day (BID) | ORAL | 0 refills | Status: DC

## 2020-12-09 NOTE — Telephone Encounter (Signed)
Patient has an appt on 01/03/21. Wondering if she could get enough meds till her appt   Medication: gabapentin (NEURONTIN) 300 MG capsule [[id number]]   amLODipine (NORVASC) 5 MG tablet [[id number]]      Has the patient contacted their pharmacy? No, pt need an appt  (If no, request that the patient contact the pharmacy for the refill.) (If yes, when and what did the pharmacy advise?)     Preferred Pharmacy (with phone number or street name): [facility], [place] - [address]  [place], [place] [address]  Phone:  [phone] Fax:  [phone]      Agent: Please be advised that RX refills may take up to 3 business days. We ask that you follow-up with your pharmacy.

## 2020-12-09 NOTE — Telephone Encounter (Signed)
Refills sent

## 2020-12-10 ENCOUNTER — Other Ambulatory Visit: Payer: Self-pay

## 2020-12-10 ENCOUNTER — Ambulatory Visit
Admission: RE | Admit: 2020-12-10 | Discharge: 2020-12-10 | Disposition: A | Discharge status: Home / Self Care | Payer: BC Managed Care – PPO | Source: Ambulatory Visit | Attending: Emergency Medicine | Admitting: Emergency Medicine

## 2020-12-10 VITALS — BP 204/99 | HR 64 | Temp 98.5°F | Resp 18

## 2020-12-10 DIAGNOSIS — S161XXA Strain of muscle, fascia and tendon at neck level, initial encounter: Secondary | ICD-10-CM

## 2020-12-10 MED ORDER — TIZANIDINE HCL 2 MG PO TABS
2.0000 mg | ORAL_TABLET | Freq: Three times a day (TID) | ORAL | 0 refills | Status: DC | PRN
Start: 2020-12-10 — End: 2021-05-08

## 2020-12-10 MED ORDER — PREDNISONE 10 MG PO TABS: ORAL_TABLET | ORAL | 0 refills | Status: DC

## 2020-12-10 NOTE — ED Provider Notes (Addendum)
[facility]    CSN: [id number] Arrival date & time: 12/10/20  1244      History   Chief Complaint Chief Complaint  Patient presents with  . appt 1p - back pain    HPI Kayla Banks is a 65 y.o. female history of breast cancer, hyperlipidemia, presenting today for evaluation of back pain.  Reports approximately 5-6 days ago developed discomfort in her neck.  Believes symptoms started after lifting a few cases of water at the grocery store.  She denies other injury or trauma.  Pain initially worse on the left, but now worse on the right.  Denies any numbness or tingling.  Did feel a slight discomfort in her left axillary area which she has had prior lymph ectomy from prior breast cancer surgery.  Denies continued discomfort in this area.  Using Tylenol and ibuprofen without relief of symptoms.  HPI  Past Medical History:  Diagnosis Date  . Breast cancer ([place])   . Complication of anesthesia   . Family history of breast cancer   . Heart murmur    no problems, saw cardiologist 2010  . Hypertension    on meds  . Hypothyroidism   . Osteoarthritis   . Personal history of chemotherapy   . Personal history of radiation therapy    left  . Plantar fasciitis   . PONV (postoperative nausea and vomiting)   . Thyroid disease    Hypothyroidism    Patient Active Problem List   Diagnosis Date Noted  . Chemotherapy-induced peripheral neuropathy ([place]) 03/30/2019  . Port-A-Cath in place 12/15/2018  . Genetic testing 09/21/2018  . Family history of breast cancer   . Malignant neoplasm of upper-outer quadrant of left breast in female, estrogen receptor positive ([place]) 08/29/2018  . Hyperlipidemia 08/04/2018  . Obesity (BMI 30-39.9) 05/09/2013  . Preventative health care 11/19/2010  . URI (upper respiratory infection) 11/19/2010  . KNEE PAIN, BILATERAL 09/25/2010  . SINUSITIS - ACUTE-NOS 12/24/2009  . MORBID OBESITY 06/14/2009  . COLD SORE 10/16/2008  . SKIN TAG  06/18/2008  . Hypothyroidism 03/17/2007  . Essential hypertension 03/17/2007    Past Surgical History:  Procedure Laterality Date  . ABDOMINAL HYSTERECTOMY  02/2012  . AXILLARY LYMPH NODE DISSECTION Left 11/09/2018   Procedure: LEFT AXILLARY LYMPH NODE DISSECTION;  Surgeon: Erroll Luna, MD;  Location: [facility];  Service: General;  Laterality: Left;  . BREAST BIOPSY Left    malignant  . 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/11/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  . BREAST LUMPECTOMY WITH RADIOACTIVE SEED AND SENTINEL LYMPH NODE BIOPSY Left 10/11/2018   Procedure: LEFT BREAST LUMPECTOMY WITH RADIOACTIVE SEED AND LEFT SENTINEL LYMPH NODE MAPPING WITH LEFT TARGETED LYMPH NODE BIOPSY;  Surgeon: Erroll Luna, MD;  Location: [facility];  Service: General;  Laterality: Left;  . CATARACT EXTRACTION Bilateral 11/3  06/26/20   shipiro   . FOOT SURGERY Left    bone spurs  . LUMBAR LAMINECTOMY  1990  . PORT-A-CATH REMOVAL N/A 05/23/2019   Procedure: PORT REMOVAL;  Surgeon: Erroll Luna, MD;  Location: [facility];  Service: General;  Laterality: N/A;  . PORTACATH PLACEMENT Right 12/01/2018   Procedure: INSERTION PORT-A-CATH WITH ULTRASOUND;  Surgeon: Erroll Luna, MD;  Location: [facility];  Service: General;  Laterality: Right;    OB History   No obstetric history on file.      Home Medications    Prior to Admission medications  Medication Sig Start Date End Date Taking? Authorizing Provider  predniSONE (DELTASONE) 10 MG tablet Begin with 6 tabs on day 1, 5 tab on day 2, 4 tab on day 3, 3 tab on day 4, 2 tab on day 5, 1 tab on day 6-take with food 12/10/20  Yes Cashlyn Huguley C, PA-C  tiZANidine (ZANAFLEX) 2 MG tablet Take 1-2 tablets (2-4 mg total) by mouth every 8 (eight) hours as needed for muscle spasms. 12/10/20  Yes Riel Hirschman C, PA-C  amLODipine (NORVASC) 5 MG tablet Take 1 tablet (5 mg total) by mouth daily.  Needs ov before any more refills 12/09/20   Carollee Herter, Kendrick Fries R, DO  anastrozole (ARIMIDEX) 1 MG tablet Take 1 tablet (1 mg total) by mouth daily. 04/29/20   Nicholas Lose, MD  carvedilol (COREG) 25 MG tablet Take 1 tablet (25 mg total) by mouth 2 (two) times daily with a meal. 10/11/20   Carollee Herter, Alferd Apa, DO  EPINEPHRINE 0.3 mg/0.3 mL IJ SOAJ injection INJECT 0.3 ML(1 SYRINGE) IN THE MUSCLE AS NEEDED FOR ALLERGIC REACTION 01/14/17   Carollee Herter, Alferd Apa, DO  furosemide (LASIX) 20 MG tablet 1 po qd 01/01/20   Ann Held, DO  gabapentin (NEURONTIN) 300 MG capsule Take 1 capsule (300 mg total) by mouth 2 (two) times daily. 12/09/20   Ann Held, DO  ibuprofen (ADVIL) 800 MG tablet Take 1 tablet (800 mg total) by mouth every 8 (eight) hours as needed. 05/23/19   Cornett, [name], MD  LORazepam (ATIVAN) 0.5 MG tablet TAKE 1 TABLET(0.5 MG) BY MOUTH AT BEDTIME AS NEEDED FOR SLEEP 01/23/19   Nicholas Lose, MD  meloxicam (MOBIC) 15 MG tablet Take 1 tablet (15 mg total) by mouth daily as needed for pain. 10/11/20   Carollee Herter, Kendrick Fries R, DO  SYNTHROID 100 MCG tablet TAKE 1 TABLET BY MOUTH EVERY DAY 03/11/20   Carollee Herter, Alferd Apa, DO  valACYclovir (VALTREX) 1000 MG tablet TAKE 1 TABLET BY MOUTH THREE TIMES DAILY AS NEEDED 01/01/20   Ann Held, DO    Family History Family History  Problem Relation Age of Onset  . Diabetes Mother   . Hyperlipidemia Mother   . Hypertension Mother   . Hypertension Father   . Heart disease Father 40       ?MI  . Hypothyroidism Sister   . Hyperthyroidism Sister   . Breast cancer Maternal Aunt        dx 60's/70's  . Breast cancer Other        dx early 55's    Social History Social History   Tobacco Use  . Smoking status: Never Smoker  . Smokeless tobacco: Never Used  Substance Use Topics  . Alcohol use: Yes    Comment: occassionally  . Drug use: No     Allergies   Erythromycin, Lisinopril, Penicillins, and Adhesive  [tape]   Review of Systems Review of Systems  Constitutional: Negative for fatigue and fever.  HENT: Negative for mouth sores.   Eyes: Negative for visual disturbance.  Respiratory: Negative for shortness of breath.   Cardiovascular: Negative for chest pain.  Gastrointestinal: Negative for abdominal pain, nausea and vomiting.  Musculoskeletal: Positive for back pain and myalgias. Negative for arthralgias and joint swelling.  Skin: Positive for color change and rash. Negative for wound.  Neurological: Negative for dizziness, weakness, light-headedness and headaches.     Physical Exam Triage Vital Signs ED Triage Vitals  Enc Vitals  Group     BP      Pulse      Resp      Temp      Temp src      SpO2      Weight      Height      Head Circumference      Peak Flow      Pain Score      Pain Loc      Pain Edu?      Excl. in [place]?    No data found.  Updated Vital Signs BP (!) 204/99 (BP Location: Left Arm)   Pulse 64   Temp 98.5 F (36.9 C) (Oral)   Resp 18   LMP 01/28/2012   SpO2 97%   Visual Acuity Right Eye Distance:   Left Eye Distance:   Bilateral Distance:    Right Eye Near:   Left Eye Near:    Bilateral Near:     Physical Exam Vitals and nursing note reviewed.  Constitutional:      Appearance: She is well-developed.     Comments: No acute distress  HENT:     Head: Normocephalic and atraumatic.     Nose: Nose normal.  Eyes:     Conjunctiva/sclera: Conjunctivae normal.  Cardiovascular:     Rate and Rhythm: Normal rate.  Pulmonary:     Effort: Pulmonary effort is normal. No respiratory distress.     Comments: Breathing comfortably at rest, CTABL, no wheezing, rales or other adventitious sounds auscultated Abdominal:     General: There is no distension.  Musculoskeletal:        General: Normal range of motion.     Cervical back: Neck supple.     Comments: Back: Nontender to palpation of cervical, thoracic and lumbar spine midline, no palpable  deformity or step-off, tenderness to palpation to bilateral cervical musculature and superior trapezius/thoracic areas, more prominent on right side, full active range of motion of shoulders  Skin:    General: Skin is warm and dry.  Neurological:     Mental Status: She is alert and oriented to person, place, and time.      UC Treatments / Results  Labs (all labs ordered are listed, but only abnormal results are displayed) Labs Reviewed - No data to display  EKG   Radiology No results found.  Procedures Procedures (including critical care time)  Medications Ordered in UC Medications - No data to display  Initial Impression / Assessment and Plan / UC Course  I have reviewed the triage vital signs and the nursing notes.  Pertinent labs & imaging results that were available during my care of the patient were reviewed by me and considered in my medical decision making (see chart for details).     Suspect cervical straining, no systemic symptoms, no neuro deficits, suspect likely MSK etiology and recommending continued anti-inflammatories and muscle relaxers.  Using NSAIDs without relief, will trial prednisone taper along with tizanidine.  Discussed activity modification, alternate ice and heat.  Continue to monitor,Discussed strict return precautions. Patient verbalized understanding and is agreeable with plan.  Final Clinical Impressions(s) / UC Diagnoses   Final diagnoses:  Cervical strain, acute, initial encounter     Discharge Instructions     Begin prednisone taper x6 days-begin with 6 tablets on day 1, decrease by 1 tablet each day until complete-6, 5, 4, 3, 2, 1-take with food and earlier in the day if possible Supplement with tizanidine-this is  a muscle laxer, may cause drowsiness, do not drive or work after taking Alternate ice and heat Gentle stretching Follow-up if not improving or worsening    ED Prescriptions    Medication Sig Dispense Auth. Provider    predniSONE (DELTASONE) 10 MG tablet Begin with 6 tabs on day 1, 5 tab on day 2, 4 tab on day 3, 3 tab on day 4, 2 tab on day 5, 1 tab on day 6-take with food 21 tablet Shayann Garbutt C, PA-C   tiZANidine (ZANAFLEX) 2 MG tablet Take 1-2 tablets (2-4 mg total) by mouth every 8 (eight) hours as needed for muscle spasms. 30 tablet Shondra Capps, [place] C, PA-C     PDMP not reviewed this encounter.   Janith Lima, PA-C 12/10/20 1335    Janith Lima, PA-C 12/10/20 1336

## 2020-12-10 NOTE — Discharge Instructions (Signed)
Begin prednisone taper x6 days-begin with 6 tablets on day 1, decrease by 1 tablet each day until complete-6, 5, 4, 3, 2, 1-take with food and earlier in the day if possible Supplement with tizanidine-this is a muscle laxer, may cause drowsiness, do not drive or work after taking Alternate ice and heat Gentle stretching Follow-up if not improving or worsening

## 2020-12-10 NOTE — ED Triage Notes (Signed)
Pt c/o upper back pain radiating up to neck over the past week after lifting a few cases of waters. States using head/ice and tylenol with some relief.

## 2021-01-02 ENCOUNTER — Other Ambulatory Visit: Payer: Self-pay

## 2021-01-03 ENCOUNTER — Other Ambulatory Visit: Payer: Self-pay | Admitting: Family Medicine

## 2021-01-03 ENCOUNTER — Ambulatory Visit: Payer: BC Managed Care – PPO | Attending: Internal Medicine

## 2021-01-03 ENCOUNTER — Encounter: Payer: Self-pay | Admitting: Family Medicine

## 2021-01-03 ENCOUNTER — Ambulatory Visit (INDEPENDENT_AMBULATORY_CARE_PROVIDER_SITE_OTHER): Payer: BC Managed Care – PPO | Admitting: Family Medicine

## 2021-01-03 VITALS — BP 130/80 | HR 70 | Temp 98.6°F | Resp 18 | Ht 65.0 in | Wt 269.2 lb

## 2021-01-03 DIAGNOSIS — T451X5A Adverse effect of antineoplastic and immunosuppressive drugs, initial encounter: Secondary | ICD-10-CM

## 2021-01-03 DIAGNOSIS — R739 Hyperglycemia, unspecified: Secondary | ICD-10-CM

## 2021-01-03 DIAGNOSIS — M170 Bilateral primary osteoarthritis of knee: Secondary | ICD-10-CM

## 2021-01-03 DIAGNOSIS — Z23 Encounter for immunization: Secondary | ICD-10-CM

## 2021-01-03 DIAGNOSIS — Z0000 Encounter for general adult medical examination without abnormal findings: Secondary | ICD-10-CM | POA: Diagnosis not present

## 2021-01-03 DIAGNOSIS — I10 Essential (primary) hypertension: Secondary | ICD-10-CM

## 2021-01-03 DIAGNOSIS — E039 Hypothyroidism, unspecified: Secondary | ICD-10-CM | POA: Diagnosis not present

## 2021-01-03 DIAGNOSIS — E785 Hyperlipidemia, unspecified: Secondary | ICD-10-CM

## 2021-01-03 DIAGNOSIS — G629 Polyneuropathy, unspecified: Secondary | ICD-10-CM

## 2021-01-03 DIAGNOSIS — R2689 Other abnormalities of gait and mobility: Secondary | ICD-10-CM

## 2021-01-03 DIAGNOSIS — G620 Drug-induced polyneuropathy: Secondary | ICD-10-CM

## 2021-01-03 DIAGNOSIS — Z1211 Encounter for screening for malignant neoplasm of colon: Secondary | ICD-10-CM

## 2021-01-03 LAB — CBC WITH DIFFERENTIAL/PLATELET
Basophils Absolute: 0 10*3/uL (ref 0.0–0.1)
Basophils Relative: 0.2 % (ref 0.0–3.0)
Eosinophils Absolute: 0.2 10*3/uL (ref 0.0–0.7)
Eosinophils Relative: 2.4 % (ref 0.0–5.0)
HCT: 41.8 % (ref 36.0–46.0)
Hemoglobin: 13.6 g/dL (ref 12.0–15.0)
Lymphocytes Relative: 20.4 % (ref 12.0–46.0)
Lymphs Abs: 1.8 10*3/uL (ref 0.7–4.0)
MCHC: 32.7 g/dL (ref 30.0–36.0)
MCV: 85.4 fl (ref 78.0–100.0)
Monocytes Absolute: 0.5 10*3/uL (ref 0.1–1.0)
Monocytes Relative: 5.2 % (ref 3.0–12.0)
Neutro Abs: 6.3 10*3/uL (ref 1.4–7.7)
Neutrophils Relative %: 71.8 % (ref 43.0–77.0)
Platelets: 221 10*3/uL (ref 150.0–400.0)
RBC: 4.89 Mil/uL (ref 3.87–5.11)
RDW: 15.4 % (ref 11.5–15.5)
WBC: 8.8 10*3/uL (ref 4.0–10.5)

## 2021-01-03 LAB — TSH: TSH: 1.66 u[IU]/mL (ref 0.35–4.50)

## 2021-01-03 LAB — COMPREHENSIVE METABOLIC PANEL
ALT: 12 U/L (ref 0–35)
AST: 12 U/L (ref 0–37)
Albumin: 3.9 g/dL (ref 3.5–5.2)
Alkaline Phosphatase: 93 U/L (ref 39–117)
BUN: 17 mg/dL (ref 6–23)
CO2: 29 mEq/L (ref 19–32)
Calcium: 9.3 mg/dL (ref 8.4–10.5)
Chloride: 104 mEq/L (ref 96–112)
Creatinine, Ser: 0.71 mg/dL (ref 0.40–1.20)
GFR: 90.81 mL/min (ref >60.00)
Glucose, Bld: 116 mg/dL — ABNORMAL HIGH (ref 70–99)
Potassium: 4 mEq/L (ref 3.5–5.1)
Sodium: 141 mEq/L (ref 135–145)
Total Bilirubin: 1 mg/dL (ref 0.2–1.2)
Total Protein: 6.7 g/dL (ref 6.0–8.3)

## 2021-01-03 LAB — LIPID PANEL
Cholesterol: 209 mg/dL — ABNORMAL HIGH (ref 0–200)
HDL: 71.6 mg/dL (ref >39.00)
LDL Cholesterol: 126 mg/dL — ABNORMAL HIGH (ref 0–99)
NonHDL: 137.2
Total CHOL/HDL Ratio: 3
Triglycerides: 58 mg/dL (ref 0.0–149.0)
VLDL: 11.6 mg/dL (ref 0.0–40.0)

## 2021-01-03 MED ORDER — FUROSEMIDE 20 MG PO TABS: ORAL_TABLET | ORAL | 3 refills | Status: DC

## 2021-01-03 MED ORDER — SYNTHROID 100 MCG PO TABS: 100.0000 ug | ORAL_TABLET | Freq: Every day | ORAL | 3 refills | Status: DC

## 2021-01-03 MED ORDER — MELOXICAM 15 MG PO TABS: 15.0000 mg | ORAL_TABLET | Freq: Every day | ORAL | 3 refills | Status: DC | PRN

## 2021-01-03 NOTE — Assessment & Plan Note (Signed)
Refer to pt due to balance issues because if it

## 2021-01-03 NOTE — Assessment & Plan Note (Signed)
Well controlled, no changes to meds. Encouraged heart healthy diet such as the DASH diet and exercise as tolerated.  °

## 2021-01-03 NOTE — Patient Instructions (Signed)
Preventive Care 63-63 Years Old, Female Preventive care refers to lifestyle choices and visits with your health care provider that can promote health and wellness. This includes:  A yearly physical exam. This is also called an annual wellness visit.  Regular dental and eye exams.  Immunizations.  Screening for certain conditions.  Healthy lifestyle choices, such as: ? Eating a healthy diet. ? Getting regular exercise. ? Not using drugs or products that contain nicotine and tobacco. ? Limiting alcohol use. What can I expect for my preventive care visit? Physical exam Your health care provider will check your:  Height and weight. These may be used to calculate your BMI (body mass index). BMI is a measurement that tells if you are at a healthy weight.  Heart rate and blood pressure.  Body temperature.  Skin for abnormal spots. Counseling Your health care provider may ask you questions about your:  Past medical problems.  Family's medical history.  Alcohol, tobacco, and drug use.  Emotional well-being.  Home life and relationship well-being.  Sexual activity.  Diet, exercise, and sleep habits.  Work and work Statistician.  Access to firearms.  Method of birth control.  Menstrual cycle.  Pregnancy history. What immunizations do I need? Vaccines are usually given at various ages, according to a schedule. Your health care provider will recommend vaccines for you based on your age, medical history, and lifestyle or other factors, such as travel or where you work.   What tests do I need? Blood tests  Lipid and cholesterol levels. These may be checked every 5 years, or more often if you are over 3 years old.  Hepatitis C test.  Hepatitis B test. Screening  Lung cancer screening. You may have this screening every year starting at age 73 if you have a 30-pack-year history of smoking and currently smoke or have quit within the past 15 years.  Colorectal cancer  screening. ? All adults should have this screening starting at age 52 and continuing until age 17. ? Your health care provider may recommend screening at age 49 if you are at increased risk. ? You will have tests every 1-10 years, depending on your results and the type of screening test.  Diabetes screening. ? This is done by checking your blood sugar (glucose) after you have not eaten for a while (fasting). ? You may have this done every 1-3 years.  Mammogram. ? This may be done every 1-2 years. ? Talk with your health care provider about when you should start having regular mammograms. This may depend on whether you have a family history of breast cancer.  BRCA-related cancer screening. This may be done if you have a family history of breast, ovarian, tubal, or peritoneal cancers.  Pelvic exam and Pap test. ? This may be done every 3 years starting at age 10. ? Starting at age 11, this may be done every 5 years if you have a Pap test in combination with an HPV test. Other tests  STD (sexually transmitted disease) testing, if you are at risk.  Bone density scan. This is done to screen for osteoporosis. You may have this scan if you are at high risk for osteoporosis. Talk with your health care provider about your test results, treatment options, and if necessary, the need for more tests. Follow these instructions at home: Eating and drinking  Eat a diet that includes fresh fruits and vegetables, whole grains, lean protein, and low-fat dairy products.  Take vitamin and mineral supplements  as recommended by your health care provider.  Do not drink alcohol if: ? Your health care provider tells you not to drink. ? You are pregnant, may be pregnant, or are planning to become pregnant.  If you drink alcohol: ? Limit how much you have to 0-1 drink a day. ? Be aware of how much alcohol is in your drink. In the [place] one drink equals one 12 oz bottle of beer (355 mL), one 5 oz glass of  wine (148 mL), or one 1 oz glass of hard liquor (44 mL).   Lifestyle  Take daily care of your teeth and gums. Brush your teeth every morning and night with fluoride toothpaste. Floss one time each day.  Stay active. Exercise for at least 30 minutes 5 or more days each week.  Do not use any products that contain nicotine or tobacco, such as cigarettes, e-cigarettes, and chewing tobacco. If you need help quitting, ask your health care provider.  Do not use drugs.  If you are sexually active, practice safe sex. Use a condom or other form of protection to prevent STIs (sexually transmitted infections).  If you do not wish to become pregnant, use a form of birth control. If you plan to become pregnant, see your health care provider for a prepregnancy visit.  If told by your health care provider, take low-dose aspirin daily starting at age 50.  Find healthy ways to cope with stress, such as: ? Meditation, yoga, or listening to music. ? Journaling. ? Talking to a trusted person. ? Spending time with friends and family. Safety  Always wear your seat belt while driving or riding in a vehicle.  Do not drive: ? If you have been drinking alcohol. Do not ride with someone who has been drinking. ? When you are tired or distracted. ? While texting.  Wear a helmet and other protective equipment during sports activities.  If you have firearms in your house, make sure you follow all gun safety procedures. What's next?  Visit your health care provider once a year for an annual wellness visit.  Ask your health care provider how often you should have your eyes and teeth checked.  Stay up to date on all vaccines. This information is not intended to replace advice given to you by your health care provider. Make sure you discuss any questions you have with your health care provider. Document Revised: 05/07/2020 Document Reviewed: 04/14/2018 Elsevier Patient Education  2021 Elsevier Inc.  

## 2021-01-03 NOTE — Progress Notes (Signed)
   Covid-19 Vaccination Clinic  Name:  Kayla Banks    MRN: [id number] DOB: 12-29-1955  01/03/2021  [name] was observed post Covid-19 immunization for 15 minutes without incident. She was provided with Vaccine Information Sheet and instruction to access the V-Safe system.   [name] was instructed to call 911 with any severe reactions post vaccine: Marland Kitchen Difficulty breathing  . Swelling of face and throat  . A fast heartbeat  . A bad rash all over body  . Dizziness and weakness   Immunizations Administered    Name Date Dose VIS Date Route   Moderna Covid-19 Booster Vaccine 01/03/2021 10:53 AM 0.25 mL 06/05/2020 Intramuscular   Manufacturer: Moderna   Lot: [id number]   [place]: [id number]     [id number]

## 2021-01-03 NOTE — Assessment & Plan Note (Signed)
Encouraged heart healthy diet, increase exercise, avoid trans fats, consider a krill oil cap daily 

## 2021-01-03 NOTE — Progress Notes (Signed)
Patient ID: Kayla Banks, female    DOB: 01-Dec-1955  Age: 65 y.o. MRN: [id number]    Subjective:  Subjective  HPI Kallan Herriman presents for a comprehensive physical examination today. She complains of neuropathy in her bilateral feet and hands. She notes that she felt unsteady. She states that it started several months ago, and her balance had worsen over time. She reports that the neuropathy is the result of chemotherapy.  She denies any chest pain, SOB, fever, abdominal pain, cough, chills, sore throat, dysuria, urinary incontinence, back pain, HA, or N/V/D at this time. She states that her eye are doing well. She notes that she is going to get glasses. She is planning on getting a colonoscopy.   Review of Systems  Constitutional: Negative for chills, fatigue and fever.  HENT: Negative for ear pain, rhinorrhea, sinus pressure, sinus pain, sore throat and tinnitus.   Eyes: Negative for pain.  Respiratory: Negative for cough, shortness of breath and wheezing.   Cardiovascular: Negative for chest pain.  Gastrointestinal: Negative for abdominal pain, anal bleeding, constipation, diarrhea, nausea and vomiting.  Genitourinary: Negative for flank pain.  Musculoskeletal: Negative for back pain and neck pain.  Skin: Negative for rash.  Neurological: Positive for weakness (Bilateral hands and feets) and numbness (bilateral hands and feet). Negative for seizures, light-headedness and headaches.       (+) neuropathy     History Past Medical History:  Diagnosis Date  . Breast cancer ([place])   . Complication of anesthesia   . Family history of breast cancer   . Heart murmur    no problems, saw cardiologist 2010  . Hypertension    on meds  . Hypothyroidism   . Osteoarthritis   . Personal history of chemotherapy   . Personal history of radiation therapy    left  . Plantar fasciitis   . PONV (postoperative nausea and vomiting)   . Thyroid disease    Hypothyroidism    She has a past  surgical history that includes Lumbar laminectomy (1990); Foot surgery (Left); Abdominal hysterectomy (02/2012); Breast lumpectomy with radioactive seed and sentinel lymph node biopsy (Left, 10/11/2018); Axillary lymph node dissection (Left, 11/09/2018); Portacath placement (Right, 12/01/2018); Port-a-cath removal (N/A, 05/23/2019); Breast biopsy (Left); Breast lumpectomy (Left, 10/11/2018); and Cataract extraction (Bilateral, 11/3  06/26/20).   Her family history includes Breast cancer in her maternal aunt and another family member; Diabetes in her mother; Heart disease (age of onset: 41) in her father; Hyperlipidemia in her mother; Hypertension in her father and mother; Hyperthyroidism in her sister; Hypothyroidism in her sister.She reports that she has never smoked. She has never used smokeless tobacco. She reports current alcohol use. She reports that she does not use drugs.  Current Outpatient Medications on File Prior to Visit  Medication Sig Dispense Refill  . amLODipine (NORVASC) 5 MG tablet Take 1 tablet (5 mg total) by mouth daily. Needs ov before any more refills 90 tablet 1  . anastrozole (ARIMIDEX) 1 MG tablet Take 1 tablet (1 mg total) by mouth daily. 90 tablet 3  . carvedilol (COREG) 25 MG tablet Take 1 tablet (25 mg total) by mouth 2 (two) times daily with a meal. 180 tablet 1  . EPINEPHRINE 0.3 mg/0.3 mL IJ SOAJ injection INJECT 0.3 ML(1 SYRINGE) IN THE MUSCLE AS NEEDED FOR ALLERGIC REACTION 2 Device 0  . gabapentin (NEURONTIN) 300 MG capsule Take 1 capsule (300 mg total) by mouth 2 (two) times daily. 180 capsule 0  . ibuprofen (ADVIL)  800 MG tablet Take 1 tablet (800 mg total) by mouth every 8 (eight) hours as needed. 30 tablet 0  . LORazepam (ATIVAN) 0.5 MG tablet TAKE 1 TABLET(0.5 MG) BY MOUTH AT BEDTIME AS NEEDED FOR SLEEP 30 tablet 1  . valACYclovir (VALTREX) 1000 MG tablet TAKE 1 TABLET BY MOUTH THREE TIMES DAILY AS NEEDED 30 tablet 2  . tiZANidine (ZANAFLEX) 2 MG tablet Take 1-2  tablets (2-4 mg total) by mouth every 8 (eight) hours as needed for muscle spasms. (Patient not taking: Reported on 01/03/2021) 30 tablet 0   No current facility-administered medications on file prior to visit.     Objective:  Objective  Physical Exam Vitals and nursing note reviewed.  Constitutional:      General: She is not in acute distress.    Appearance: Normal appearance. She is well-developed. She is not ill-appearing.  HENT:     Head: Normocephalic and atraumatic.     Right Ear: External ear normal.     Left Ear: External ear normal.     Nose: Nose normal.  Eyes:     General:        Right eye: No discharge.        Left eye: No discharge.     Extraocular Movements: Extraocular movements intact.     Pupils: Pupils are equal, round, and reactive to light.  Cardiovascular:     Rate and Rhythm: Normal rate and regular rhythm.     Pulses: Normal pulses.     Heart sounds: Normal heart sounds. No murmur heard. No friction rub. No gallop.   Pulmonary:     Effort: Pulmonary effort is normal. No respiratory distress.     Breath sounds: Normal breath sounds. No stridor. No wheezing, rhonchi or rales.  Chest:     Chest wall: No tenderness.  Abdominal:     General: Bowel sounds are normal. There is no distension.     Palpations: Abdomen is soft. There is no mass.     Tenderness: There is no abdominal tenderness. There is no guarding or rebound.     Hernia: No hernia is present.  Musculoskeletal:        General: Normal range of motion.     Cervical back: Normal range of motion and neck supple.     Right lower leg: No edema.     Left lower leg: No edema.  Skin:    General: Skin is warm and dry.  Neurological:     Mental Status: She is alert and oriented to person, place, and time.     Comments: There are decrease sensation present in the bilateral feet and hands.   Psychiatric:        Behavior: Behavior normal.        Thought Content: Thought content normal.    BP 130/80  (BP Location: Right Arm, Patient Position: Sitting, Cuff Size: Large)   Pulse 70   Temp 98.6 F (37 C) (Oral)   Resp 18   Ht 5\' 5"  (1.651 m)   Wt 269 lb 3.2 oz (122.1 kg)   LMP 01/28/2012   SpO2 98%   BMI 44.80 kg/m  Wt Readings from Last 3 Encounters:  01/03/21 269 lb 3.2 oz (122.1 kg)  07/05/20 264 lb 6.4 oz (119.9 kg)  04/29/20 256 lb 11.2 oz (116.4 kg)     Lab Results  Component Value Date   WBC 9.0 01/01/2020   HGB 12.8 01/01/2020   HCT 39.2 01/01/2020  PLT 254.0 01/01/2020   GLUCOSE 101 (H) 07/05/2020   CHOL 186 07/05/2020   TRIG 48.0 07/05/2020   HDL 68.40 07/05/2020   LDLCALC 108 (H) 07/05/2020   ALT 11 07/05/2020   AST 13 07/05/2020   NA 143 07/05/2020   K 4.9 07/05/2020   CL 104 07/05/2020   CREATININE 0.61 07/05/2020   BUN 17 07/05/2020   CO2 33 (H) 07/05/2020   TSH 1.36 07/05/2020   HGBA1C 6.0 11/04/2015    No results found.   Assessment & Plan:  Plan    Meds ordered this encounter  Medications  . furosemide (LASIX) 20 MG tablet    Sig: 1 po qd    Dispense:  90 tablet    Refill:  3  . SYNTHROID 100 MCG tablet    Sig: Take 1 tablet (100 mcg total) by mouth daily.    Dispense:  90 tablet    Refill:  3  . meloxicam (MOBIC) 15 MG tablet    Sig: Take 1 tablet (15 mg total) by mouth daily as needed for pain.    Dispense:  90 tablet    Refill:  3    Problem List Items Addressed This Visit      Unprioritized   Chemotherapy-induced peripheral neuropathy ([place])    Refer to pt due to balance issues because if it      Essential hypertension    Well controlled, no changes to meds. Encouraged heart healthy diet such as the DASH diet and exercise as tolerated.       Relevant Medications   furosemide (LASIX) 20 MG tablet   Other Relevant Orders   Lipid panel   CBC with Differential/Platelet   TSH   Comprehensive metabolic panel   Hyperlipidemia    Encouraged heart healthy diet, increase exercise, avoid trans fats, consider a krill oil cap  daily      Relevant Medications   furosemide (LASIX) 20 MG tablet   Hypothyroidism    Check labs       Relevant Medications   SYNTHROID 100 MCG tablet   Other Relevant Orders   TSH   Preventative health care - Primary    ghm utd Check labs See avs      Relevant Orders   Lipid panel   CBC with Differential/Platelet   TSH   Comprehensive metabolic panel    Other Visit Diagnoses    Primary osteoarthritis of both knees       Relevant Medications   meloxicam (MOBIC) 15 MG tablet   Colon cancer screening       Relevant Orders   Ambulatory referral to Gastroenterology   Neuropathy       Relevant Orders   Ambulatory referral to Physical Therapy   Balance problem       Relevant Orders   Ambulatory referral to Physical Therapy     Colonoscopy: Last completed on 09/18/2008, polyps, diverticulosis, and hemorrhoids noted, repeat every 10 years.   Mammo: Last completed on 06/11/2020, results were normal, repeat every 1 year.   Pap Smear: Last completed on 02/08/2015, results were normal.  Follow-up: Return in about 6 months (around 07/06/2021), or if symptoms worsen or fail to improve, for hypertension.   I,Gordon Zheng,acting as a Education administrator for [employer], DO.,have documented all relevant documentation on the behalf of Ann Held, DO,as directed by  Ann Held, DO while in the presence of Lise Auer  Carollee Herter, DO, have reviewed all documentation for this visit. The documentation on 01/03/21 for the exam, diagnosis, procedures, and orders are all accurate and complete.

## 2021-01-03 NOTE — Assessment & Plan Note (Signed)
Check labs 

## 2021-01-03 NOTE — Assessment & Plan Note (Signed)
ghm utd Check labs  See avs  

## 2021-01-06 ENCOUNTER — Other Ambulatory Visit (HOSPITAL_BASED_OUTPATIENT_CLINIC_OR_DEPARTMENT_OTHER): Payer: Self-pay

## 2021-01-06 ENCOUNTER — Encounter: Payer: Self-pay | Admitting: Hematology and Oncology

## 2021-01-06 MED ORDER — MODERNA COVID-19 VACCINE 100 MCG/0.5ML IM SUSP
INTRAMUSCULAR | 0 refills | Status: DC
  Filled 2021-01-06: qty 0.25, 1d supply, fill #0

## 2021-04-29 ENCOUNTER — Inpatient Hospital Stay: Payer: BC Managed Care – PPO | Admitting: Hematology and Oncology

## 2021-04-29 NOTE — Assessment & Plan Note (Deleted)
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 Adj chemo with DD Adriamycin and Cytoxan followed by Taxol weekly X 11 (stopped for neuropathy)completed 04/19/2019 2. Adj XRT10/15/2020-07/21/2019 3. Adj Anti estrogen therapy --------------------------------------------------------------------------------------------- Current treatment: Anastrozole 1 mg daily started 07/26/2019 Anastrozole toxicities: Occasional hot flash but otherwise tolerating it fairly well. Trigger finger most likely caused by anastrozole. She is seeing orthopedics.  Breast cancer surveillance: 1.  Breast exam 04/30/2019: Benign 2. Mammogram 06/11/20: I discussed with her that she needs to immediately schedule an appointment.  Return to clinic in 1 year for follow-up

## 2021-05-07 NOTE — Progress Notes (Signed)
Patient Care Team: Carollee Herter, Alferd Apa, DO as PCP - General Dorna Leitz, MD as Consulting Physician (Orthopedic Surgery) Erroll Luna, MD as Consulting Physician (General Surgery) Nicholas Lose, MD as Consulting Physician (Hematology and Oncology) Kyung Rudd, MD as Consulting Physician (Radiation Oncology) Rutherford Guys, MD as Consulting Physician (Ophthalmology)  DIAGNOSIS:    ICD-10-CM   1. Malignant neoplasm of upper-outer quadrant of left breast in female, estrogen receptor positive ([place])  C50.412    Z17.0       SUMMARY OF ONCOLOGIC HISTORY: Oncology History  Malignant neoplasm of upper-outer quadrant of left breast in female, estrogen receptor positive ([place])  08/24/2018 Initial Biopsy   Screening detected left breast mass at 2:30 position 6 mm in size with one abnormal lymph node and stable benign calcifications, biopsy of the mass grade 2 IDC with DCIS and lymphovascular invasion, lymph node biopsy is positive, ER 95%, PR 40%, Ki-67 20%, HER-2 -1+ by IHC, T1b N1 stage Ib   08/31/2018 Cancer Staging   Staging form: Breast, AJCC 8th Edition - Clinical: Stage IB (cT1b, cN1(f), cM0, G2, ER+, PR+, HER2-) - Signed by Nicholas Lose, MD on 08/31/2018   09/21/2018 Genetic Testing   Genetic testing performed through [facility]'s Common Hereditary Cancers Panel reported out on 09/19/2018 showed no pathogenic mutations.  The Common Hereditary Cancer Panel offered by [facility] includes sequencing and/or deletion duplication testing of the following 53 genes: APC, ATM, AXIN2, BARD1, BMPR1A, BRCA1, BRCA2, BRIP1, BUB1B, CDH1, CDK4, CDKN2A, CHEK2, CTNNA1, DICER1, ENG, EPCAM, GALNT12, GREM1, HOXB13, KIT, MEN1, MLH1, MLH3, MSH2, MSH3, MSH6, MUTYH, NBN, NF1, NTHL1, PALB2, PDGFRA, PMS2, POLD1, POLE, PTEN, RAD50, RAD51C, RAD51D, RNF43, RPS20, SDHA, SDHB, SDHC, SDHD, SMAD4, SMARCA4, STK11, TP53, TSC1, TSC2, VHL.   A variant of uncertain significance (VUS) in a gene called NBN was also noted. c.1979G>C  (p.Arg660Thr)   10/18/2018 Cancer Staging   Staging form: Breast, AJCC 8th Edition - Pathologic stage from 10/18/2018: Stage IB (pT1b, pN2a, cM0, G2, ER+, PR+, HER2-) - Signed by Nicholas Lose, MD on 11/14/2018   11/09/2018 Surgery   Left lumpectomy ([place]) [phone]): Grade 2 IDC with DCIS, 1 cm, margins negative, DCIS focally 0.1 cm from superior margin, 2/2 lymph nodes positive, ER 95%, PR 45%, HER-2 negative, Ki-67 20% AXLND: 3/20 LN Pos (total 5/22)   12/15/2018 -  Chemotherapy   DOXOrubicin (ADRIAMYCIN) chemo injection 142 mg, 60 mg/m2 = 142 mg, Intravenous,  Once, 4 of 4 cycles. Administration: 142 mg (12/15/2018), 142 mg (12/29/2018), 142 mg (01/12/2019), 142 mg (01/26/2019)  palonosetron (ALOXI) injection 0.25 mg, 0.25 mg, Intravenous,  Once, 4 of 4 cycles. Administration: 0.25 mg (12/15/2018), 0.25 mg (12/29/2018), 0.25 mg (01/12/2019), 0.25 mg (01/26/2019)  pegfilgrastim-cbqv (UDENYCA) injection 6 mg, 6 mg, Subcutaneous, Once, 4 of 4 cycles. Administration: 6 mg (12/17/2018), 6 mg (12/31/2018), 6 mg (01/14/2019), 6 mg (01/28/2019)  cyclophosphamide (CYTOXAN) 1,420 mg in sodium chloride 0.9 % 250 mL chemo infusion, 600 mg/m2 = 1,420 mg, Intravenous,  Once, 4 of 4 cycles. Administration: 1,420 mg (12/15/2018), 1,420 mg (12/29/2018), 1,420 mg (01/12/2019), 1,420 mg (01/26/2019)  PACLitaxel (TAXOL) 192 mg in sodium chloride 0.9 % 250 mL chemo infusion (</= 89m/m2), 80 mg/m2 = 192 mg, Intravenous,  Once, 11 of 12 cycles. Dose modification: 60 mg/m2 (original dose 80 mg/m2, Cycle 12, Reason: Dose not tolerated). Administration: 192 mg (02/09/2019), 192 mg (02/16/2019), 192 mg (02/23/2019), 192 mg (03/02/2019), 192 mg (03/09/2019), 192 mg (03/16/2019), 192 mg (03/23/2019), 144 mg (03/30/2019), 144 mg (04/06/2019), 144 mg (04/14/2019),  144 mg (04/20/2019)  fosaprepitant (EMEND) 150 mg  dexamethasone (DECADRON) 12 mg in sodium chloride 0.9 % 145 mL IVPB, , Intravenous,  Once, 4 of 4 cycles Administration:  (12/15/2018),   (12/29/2018),  (01/12/2019),  (01/26/2019)     06/01/2019 - 07/21/2019 Radiation Therapy   The patient initially received a dose of 50.4 Gy in 28 fractions to the breast using whole-breast tangent fields. This was delivered using a 3-D conformal technique. The pt received a boost delivering an additional 10 Gy in 5 fractions using a electron boost with 37mV electrons. The total dose was 60.4 Gy.    08/2019 - 08/2024 Anti-estrogen oral therapy   Anastrozole     CHIEF COMPLIANT: Follow-up of left breast cancer on anastrozole   INTERVAL HISTORY: Kayla Delanyis a 65y.o. with above-mentioned history of left breast cancer who underwent a lumpectomy followed by an axillary lymph node dissection, adjuvant chemotherapy, radiation, and is currently on antiestrogen therapy with anastrozole. Mammogram on 06/11/2020 showed no evidence of malignancy. She presents to the clinic today for follow-up.   ALLERGIES:  is allergic to erythromycin, lisinopril, penicillins, and adhesive [tape].  MEDICATIONS:  Current Outpatient Medications  Medication Sig Dispense Refill   amLODipine (NORVASC) 5 MG tablet Take 1 tablet (5 mg total) by mouth daily. Needs ov before any more refills 90 tablet 1   anastrozole (ARIMIDEX) 1 MG tablet Take 1 tablet (1 mg total) by mouth daily. 90 tablet 3   carvedilol (COREG) 25 MG tablet Take 1 tablet (25 mg total) by mouth 2 (two) times daily with a meal. 180 tablet 1   COVID-19 mRNA vaccine, Moderna, (MODERNA COVID-19 VACCINE) 100 MCG/0.5ML injection Inject into the muscle. 0.25 mL 0   EPINEPHRINE 0.3 mg/0.3 mL IJ SOAJ injection INJECT 0.3 ML(1 SYRINGE) IN THE MUSCLE AS NEEDED FOR ALLERGIC REACTION 2 Device 0   furosemide (LASIX) 20 MG tablet 1 po qd 90 tablet 3   gabapentin (NEURONTIN) 300 MG capsule Take 1 capsule (300 mg total) by mouth 2 (two) times daily. 180 capsule 0   ibuprofen (ADVIL) 800 MG tablet Take 1 tablet (800 mg total) by mouth every 8 (eight) hours as needed. 30  tablet 0   LORazepam (ATIVAN) 0.5 MG tablet TAKE 1 TABLET(0.5 MG) BY MOUTH AT BEDTIME AS NEEDED FOR SLEEP 30 tablet 1   meloxicam (MOBIC) 15 MG tablet Take 1 tablet (15 mg total) by mouth daily as needed for pain. 90 tablet 3   SYNTHROID 100 MCG tablet Take 1 tablet (100 mcg total) by mouth daily. 90 tablet 3   tiZANidine (ZANAFLEX) 2 MG tablet Take 1-2 tablets (2-4 mg total) by mouth every 8 (eight) hours as needed for muscle spasms. (Patient not taking: Reported on 01/03/2021) 30 tablet 0   valACYclovir (VALTREX) 1000 MG tablet TAKE 1 TABLET BY MOUTH THREE TIMES DAILY AS NEEDED 30 tablet 2   No current facility-administered medications for this visit.    PHYSICAL EXAMINATION: ECOG PERFORMANCE STATUS: 1 - Symptomatic but completely ambulatory  Vitals:   05/08/21 1007  BP: (!) 152/73  Pulse: 71  Resp: 18  Temp: (!) 97.2 F (36.2 C)  SpO2: 99%   Filed Weights   05/08/21 1007  Weight: 269 lb (122 kg)    BREAST: No palpable masses or nodules in either right or left breasts. No palpable axillary supraclavicular or infraclavicular adenopathy no breast tenderness or nipple discharge.  She has significant lymphedema in the left arm as well as the left  breast.  She has not been using the pump very often.  (exam performed in the presence of a chaperone)  LABORATORY DATA:  I have reviewed the data as listed CMP Latest Ref Rng & Units 01/03/2021 07/05/2020 01/01/2020  Glucose 70 - 99 mg/dL 116(H) 101(H) 86  BUN 6 - 23 mg/dL 17 17 13   Creatinine 0.40 - 1.20 mg/dL 0.71 0.61 0.60  Sodium 135 - 145 mEq/L 141 143 140  Potassium 3.5 - 5.1 mEq/L 4.0 4.9 4.5  Chloride 96 - 112 mEq/L 104 104 103  CO2 19 - 32 mEq/L 29 33(H) 32  Calcium 8.4 - 10.5 mg/dL 9.3 9.6 9.3  Total Protein 6.0 - 8.3 g/dL 6.7 6.8 6.9  Total Bilirubin 0.2 - 1.2 mg/dL 1.0 0.9 0.8  Alkaline Phos 39 - 117 U/L 93 93 82  AST 0 - 37 U/L 12 13 14   ALT 0 - 35 U/L 12 11 10     Lab Results  Component Value Date   WBC 8.8 01/03/2021    HGB 13.6 01/03/2021   HCT 41.8 01/03/2021   MCV 85.4 01/03/2021   PLT 221.0 01/03/2021   NEUTROABS 6.3 01/03/2021    ASSESSMENT & PLAN:  Malignant neoplasm of upper-outer quadrant of left breast in female, estrogen receptor positive ([place]) Left lumpectomy: Grade 2 IDC with DCIS, 1 cm, margins negative, DCIS focally 0.1 cm from superior margin, 2/2 lymph nodes positive, ER 95%, PR 45%, HER-2 negative, Ki-67 20% AXLND: 3/20 LN Pos (total 5/22)   Treatment Plan: 1. Adj chemo with DD Adriamycin and Cytoxan followed by Taxol weekly X 11 (stopped for neuropathy) completed 04/19/2019 2. Adj XRT 06/01/2019-07/21/2019 3. Adj Anti estrogen therapy --------------------------------------------------------------------------------------------- Current treatment: Anastrozole 1 mg daily started 07/26/2019 Anastrozole toxicities: Occasional hot flash but otherwise tolerating it fairly well. Trigger finger most likely caused by anastrozole.  She is seeing orthopedics.   Breast cancer surveillance: 1.  Breast exam 05/08/2021: Benign 2. Mammogram: 06/11/2020: Benign, breast density category B.   Patient will call us with the pressure on the sleeve so that we can send her a refill on the sleeves. Breast lymphedema and left arm lymphedema: I discussed with her about being compliant with a breast lymphedema pump.  If necessary we can refer her to physical therapy.  Patient did not want to do physical therapy at this time because of the expense involved.  Peripheral neuropathy: Chemotherapy-induced.  He is currently on gabapentin but rates her symptoms as 7 out of 10.  I recommended participating in the ACCRUE neuropathy clinical trial.  Return to clinic for follow-up based upon the clinical trial participation.    No orders of the defined types were placed in this encounter.  The patient has a good understanding of the overall plan. she agrees with it. she will call with any problems that may develop  before the next visit here.  Total time spent: 30 mins including face to face time and time spent for planning, charting and coordination of care  Rulon Eisenmenger, MD, MPH 05/08/2021  I, Thana Ates, am acting as scribe for Dr. Nicholas Lose.  I have reviewed the above documentation for accuracy and completeness, and I agree with the above.

## 2021-05-08 ENCOUNTER — Encounter: Payer: Self-pay | Admitting: Emergency Medicine

## 2021-05-08 ENCOUNTER — Inpatient Hospital Stay: Payer: BC Managed Care – PPO | Attending: Hematology and Oncology | Admitting: Hematology and Oncology

## 2021-05-08 ENCOUNTER — Other Ambulatory Visit: Payer: Self-pay

## 2021-05-08 DIAGNOSIS — Z79811 Long term (current) use of aromatase inhibitors: Secondary | ICD-10-CM | POA: Diagnosis not present

## 2021-05-08 DIAGNOSIS — Z9221 Personal history of antineoplastic chemotherapy: Secondary | ICD-10-CM | POA: Insufficient documentation

## 2021-05-08 DIAGNOSIS — Z923 Personal history of irradiation: Secondary | ICD-10-CM | POA: Diagnosis not present

## 2021-05-08 DIAGNOSIS — Z79899 Other long term (current) drug therapy: Secondary | ICD-10-CM | POA: Insufficient documentation

## 2021-05-08 DIAGNOSIS — C50412 Malignant neoplasm of upper-outer quadrant of left female breast: Secondary | ICD-10-CM | POA: Insufficient documentation

## 2021-05-08 DIAGNOSIS — Z170 Estrogen receptor positive status [ER+]: Secondary | ICD-10-CM | POA: Insufficient documentation

## 2021-05-08 MED ORDER — ANASTROZOLE 1 MG PO TABS: 1.0000 mg | ORAL_TABLET | Freq: Every day | ORAL | 3 refills | Status: DC

## 2021-05-08 NOTE — Assessment & Plan Note (Signed)
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 Adj chemo with DD Adriamycin and Cytoxan followed by Taxol weekly X 11 (stopped for neuropathy)completed 04/19/2019 2. Adj XRT10/15/2020-07/21/2019 3. Adj Anti estrogen therapy --------------------------------------------------------------------------------------------- Current treatment: Anastrozole 1 mg daily started 07/26/2019 Anastrozole toxicities: Occasional hot flash but otherwise tolerating it fairly well. Trigger finger most likely caused by anastrozole.  She is seeing orthopedics.  Breast cancer surveillance: 1.  Breast exam 05/08/2021: Benign 2. Mammogram: 06/11/2020: Benign, breast density category B.  Return to clinic in 1 year for follow-up

## 2021-05-08 NOTE — Research (Signed)
[facility]U-[place]-2102 - TREATMENT OF ESTABLISHED CHEMOTHERAPY-INDUCED NEUROPATHY WITH N-PALMITOYLETHANOLAMIDE, A CANNABIMIMETIC NUTRACEUTICAL: A RANDOMIZED DOUBLE-BLIND PHASE II PILOT TRIAL  05/08/21  Patient Kayla Banks was identified by Dr. Lindi Adie as a potential candidate for the above listed study.  This Clinical Research Coordinator met with Kayla Banks, MRN [id number], on 05/08/21 in a manner and location that ensures patient privacy to discuss participation in the above listed research study.  Patient is Unaccompanied.  A copy of the informed consent document with embedded HIPAA language was provided to the patient.  Patient reads, speaks, and understands [place].    Patient was provided with the business card of this Coordinator and encouraged to contact the research team with any questions.  Approximately 10 minutes were spent with the patient reviewing the informed consent documents.  Patient was provided the option of taking informed consent documents home to review and was encouraged to review at their convenience with their support network, including other care providers. Patient took the consent documents home to review.  The patient is currently taking gabapentin and aware that she would need to stop this 1 week prior to being enrolled in this study.  She rates her neuropathy symptoms of tingling, numbness, and pain at a 7 on a scale of 0-10 in the past week.   Clabe Seal Clinical Research Coordinator I  05/08/21 10:46 AM

## 2021-05-14 ENCOUNTER — Telehealth: Payer: Self-pay | Admitting: Emergency Medicine

## 2021-05-14 NOTE — Telephone Encounter (Signed)
[facility]-[place]-2102 - TREATMENT OF ESTABLISHED CHEMOTHERAPY-INDUCED NEUROPATHY WITH N-PALMITOYLETHANOLAMIDE, A CANNABIMIMETIC NUTRACEUTICAL: A RANDOMIZED DOUBLE-BLIND PHASE II PILOT TRIAL  Called pt to f/u on potential interest in trial.  Pt states she is still going over consent materials and will call back next week with her decision.  Provided call back information for patient.  Wells Guiles 'Learta CoddingNeysa Bonito, RN, BSN Clinical Research Nurse I 05/14/21 11:07 AM

## 2021-05-23 ENCOUNTER — Telehealth: Payer: Self-pay | Admitting: Oncology

## 2021-05-23 NOTE — Telephone Encounter (Signed)
Sch per 9/21 los, left message

## 2021-05-27 ENCOUNTER — Telehealth: Payer: Self-pay | Admitting: Emergency Medicine

## 2021-05-27 NOTE — Telephone Encounter (Signed)
[facility]-[place]-2102 - TREATMENT OF ESTABLISHED CHEMOTHERAPY-INDUCED NEUROPATHY WITH N-PALMITOYLETHANOLAMIDE, A CANNABIMIMETIC NUTRACEUTICAL: A RANDOMIZED DOUBLE-BLIND PHASE II PILOT TRIAL  Called pt to f/u on potential interest in study, no answer.  Left VM requesting call back if pt is interested with contact info.  Wells Guiles 'Learta CoddingNeysa Bonito, RN, BSN Clinical Research Nurse I 05/27/21 3:57 PM

## 2021-06-02 ENCOUNTER — Telehealth: Payer: Self-pay | Admitting: Emergency Medicine

## 2021-06-02 NOTE — Telephone Encounter (Signed)
[facility] - TREATMENT OF ESTABLISHED CHEMOTHERAPY-INDUCED NEUROPATHY WITH N-PALMITOYLETHANOLAMIDE, A CANNABIMIMETIC NUTRACEUTICAL: A RANDOMIZED DOUBLE-BLIND PHASE II PILOT TRIAL   Called pt to f/u on potential interest in study, no answer.  Left VM requesting call back if pt is interested with contact info.  Wells Guiles 'Learta CoddingNeysa Bonito, RN, BSN Clinical Research Nurse I 06/02/21 3:41 PM

## 2021-07-07 ENCOUNTER — Encounter: Payer: Self-pay | Admitting: Family Medicine

## 2021-07-07 ENCOUNTER — Other Ambulatory Visit: Payer: Self-pay

## 2021-07-07 ENCOUNTER — Ambulatory Visit: Payer: BC Managed Care – PPO | Admitting: Family Medicine

## 2021-07-07 VITALS — BP 140/70 | HR 68 | Temp 98.5°F | Resp 18 | Ht 65.0 in | Wt 268.0 lb

## 2021-07-07 DIAGNOSIS — G629 Polyneuropathy, unspecified: Secondary | ICD-10-CM

## 2021-07-07 DIAGNOSIS — I10 Essential (primary) hypertension: Secondary | ICD-10-CM

## 2021-07-07 DIAGNOSIS — Z1211 Encounter for screening for malignant neoplasm of colon: Secondary | ICD-10-CM

## 2021-07-07 DIAGNOSIS — Z23 Encounter for immunization: Secondary | ICD-10-CM

## 2021-07-07 DIAGNOSIS — E039 Hypothyroidism, unspecified: Secondary | ICD-10-CM

## 2021-07-07 DIAGNOSIS — E785 Hyperlipidemia, unspecified: Secondary | ICD-10-CM | POA: Diagnosis not present

## 2021-07-07 LAB — COMPREHENSIVE METABOLIC PANEL
ALT: 11 U/L (ref 0–35)
AST: 14 U/L (ref 0–37)
Albumin: 3.8 g/dL (ref 3.5–5.2)
Alkaline Phosphatase: 89 U/L (ref 39–117)
BUN: 11 mg/dL (ref 6–23)
CO2: 31 mEq/L (ref 19–32)
Calcium: 9.4 mg/dL (ref 8.4–10.5)
Chloride: 103 mEq/L (ref 96–112)
Creatinine, Ser: 0.67 mg/dL (ref 0.40–1.20)
GFR: 93.01 mL/min (ref >60.00)
Glucose, Bld: 92 mg/dL (ref 70–99)
Potassium: 4.6 mEq/L (ref 3.5–5.1)
Sodium: 141 mEq/L (ref 135–145)
Total Bilirubin: 0.8 mg/dL (ref 0.2–1.2)
Total Protein: 6.8 g/dL (ref 6.0–8.3)

## 2021-07-07 LAB — LIPID PANEL
Cholesterol: 189 mg/dL (ref 0–200)
HDL: 63.5 mg/dL (ref >39.00)
LDL Cholesterol: 115 mg/dL — ABNORMAL HIGH (ref 0–99)
NonHDL: 125.5
Total CHOL/HDL Ratio: 3
Triglycerides: 54 mg/dL (ref 0.0–149.0)
VLDL: 10.8 mg/dL (ref 0.0–40.0)

## 2021-07-07 LAB — TSH: TSH: 1.63 u[IU]/mL (ref 0.35–5.50)

## 2021-07-07 LAB — CBC WITH DIFFERENTIAL/PLATELET
Basophils Absolute: 0 10*3/uL (ref 0.0–0.1)
Basophils Relative: 0.4 % (ref 0.0–3.0)
Eosinophils Absolute: 0.1 10*3/uL (ref 0.0–0.7)
Eosinophils Relative: 1.6 % (ref 0.0–5.0)
HCT: 40.6 % (ref 36.0–46.0)
Hemoglobin: 13.1 g/dL (ref 12.0–15.0)
Lymphocytes Relative: 20.3 % (ref 12.0–46.0)
Lymphs Abs: 1.6 10*3/uL (ref 0.7–4.0)
MCHC: 32.1 g/dL (ref 30.0–36.0)
MCV: 86.3 fl (ref 78.0–100.0)
Monocytes Absolute: 0.5 10*3/uL (ref 0.1–1.0)
Monocytes Relative: 6 % (ref 3.0–12.0)
Neutro Abs: 5.8 10*3/uL (ref 1.4–7.7)
Neutrophils Relative %: 71.7 % (ref 43.0–77.0)
Platelets: 235 10*3/uL (ref 150.0–400.0)
RBC: 4.71 Mil/uL (ref 3.87–5.11)
RDW: 15.1 % (ref 11.5–15.5)
WBC: 8.1 10*3/uL (ref 4.0–10.5)

## 2021-07-07 MED ORDER — GABAPENTIN 300 MG PO CAPS: 300.0000 mg | ORAL_CAPSULE | Freq: Two times a day (BID) | ORAL | 1 refills | Status: DC

## 2021-07-07 MED ORDER — FUROSEMIDE 20 MG PO TABS: ORAL_TABLET | ORAL | 3 refills | Status: DC

## 2021-07-07 MED ORDER — CARVEDILOL 25 MG PO TABS: 25.0000 mg | ORAL_TABLET | Freq: Two times a day (BID) | ORAL | 1 refills | Status: DC

## 2021-07-07 MED ORDER — AMLODIPINE BESYLATE 5 MG PO TABS: 5.0000 mg | ORAL_TABLET | Freq: Every day | ORAL | 1 refills | Status: DC

## 2021-07-07 NOTE — Assessment & Plan Note (Signed)
Well controlled, no changes to meds. Encouraged heart healthy diet such as the DASH diet and exercise as tolerated.  °

## 2021-07-07 NOTE — Assessment & Plan Note (Signed)
Encourage heart healthy diet such as MIND or DASH diet, increase exercise, avoid trans fats, simple carbohydrates and processed foods, consider a krill or fish or flaxseed oil cap daily.  °

## 2021-07-07 NOTE — Assessment & Plan Note (Signed)
Check labs 

## 2021-07-07 NOTE — Progress Notes (Signed)
Established Patient Office Visit  Subjective:  Patient ID: Kayla Banks, female    DOB: [birth year]  Age: 65 y.o. MRN: [id number]  CC:  Chief Complaint  Patient presents with   Hypertension   Hypothyroidism   Follow-up    HPI Kayla Banks presents for f/u bp and thyroid.   No complaints   Past Medical History:  Diagnosis Date   Breast cancer ([place])    Complication of anesthesia    Family history of breast cancer    Heart murmur    no problems, saw cardiologist 2010   Hypertension    on meds   Hypothyroidism    Osteoarthritis    Personal history of chemotherapy    Personal history of radiation therapy    left   Plantar fasciitis    PONV (postoperative nausea and vomiting)    Thyroid disease    Hypothyroidism    Past Surgical History:  Procedure Laterality Date   ABDOMINAL HYSTERECTOMY  02/2012   AXILLARY LYMPH NODE DISSECTION Left 11/09/2018   Procedure: LEFT AXILLARY LYMPH NODE DISSECTION;  Surgeon: Erroll Luna, MD;  Location: [place];  Service: General;  Laterality: Left;   BREAST BIOPSY Left    malignant   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/11/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   BREAST LUMPECTOMY WITH RADIOACTIVE SEED AND SENTINEL LYMPH NODE BIOPSY Left 10/11/2018   Procedure: LEFT BREAST LUMPECTOMY WITH RADIOACTIVE SEED AND LEFT SENTINEL LYMPH NODE MAPPING WITH LEFT TARGETED LYMPH NODE BIOPSY;  Surgeon: Erroll Luna, MD;  Location: [place];  Service: General;  Laterality: Left;   CATARACT EXTRACTION Bilateral 11/3  06/26/20   shipiro    FOOT SURGERY Left    bone spurs   LUMBAR LAMINECTOMY  1990   PORT-A-CATH REMOVAL N/A 05/23/2019   Procedure: PORT REMOVAL;  Surgeon: Erroll Luna, MD;  Location: [place];  Service: General;  Laterality: N/A;   PORTACATH PLACEMENT Right 12/01/2018   Procedure: INSERTION PORT-A-CATH WITH ULTRASOUND;  Surgeon: Erroll Luna, MD;  Location: [place];  Service: General;  Laterality: Right;    Family History  Problem Relation Age of Onset   Diabetes Mother    Hyperlipidemia Mother    Hypertension Mother    Hypertension Father    Heart disease Father 60       ?MI   Hypothyroidism Sister    Hyperthyroidism Sister    Breast cancer Maternal Aunt        dx 60's/70's   Breast cancer Other        dx early 75's    Social History   Socioeconomic History   Marital status: Single    Spouse name: Not on file   Number of children: Not on file   Years of education: Not on file   Highest education level: Not on file  Occupational History   Occupation: [employer]  Tobacco Use   Smoking status: Never   Smokeless tobacco: Never  Substance and Sexual Activity   Alcohol use: Yes    Comment: occassionally   Drug use: No   Sexual activity: Yes    Partners: Male  Other Topics Concern   Not on file  Social History Narrative   Exercise-- no   Social Determinants of Health   Financial Resource Strain: Not on file  Food Insecurity: Not on file  Transportation Needs: Not on file  Physical Activity: Not on file  Stress: Not on file  Social Connections: Not on file  Intimate Partner Violence: Not on file    Outpatient Medications Prior to Visit  Medication Sig Dispense Refill   anastrozole (ARIMIDEX) 1 MG tablet Take 1 tablet (1 mg total) by mouth daily. 90 tablet 3   EPINEPHRINE 0.3 mg/0.3 mL IJ SOAJ injection INJECT 0.3 ML(1 SYRINGE) IN THE MUSCLE AS NEEDED FOR ALLERGIC REACTION 2 Device 0   ibuprofen (ADVIL) 800 MG tablet Take 1 tablet (800 mg total) by mouth every 8 (eight) hours as needed. 30 tablet 0   LORazepam (ATIVAN) 0.5 MG tablet TAKE 1 TABLET(0.5 MG) BY MOUTH AT BEDTIME AS NEEDED FOR SLEEP 30 tablet 1   meloxicam (MOBIC) 15 MG tablet Take 1 tablet (15 mg total) by mouth daily as needed for pain. 90 tablet 3   SYNTHROID 100 MCG tablet Take 1 tablet (100 mcg total) by mouth daily. 90 tablet 3    valACYclovir (VALTREX) 1000 MG tablet TAKE 1 TABLET BY MOUTH THREE TIMES DAILY AS NEEDED 30 tablet 2   amLODipine (NORVASC) 5 MG tablet Take 1 tablet (5 mg total) by mouth daily. Needs ov before any more refills 90 tablet 1   carvedilol (COREG) 25 MG tablet Take 1 tablet (25 mg total) by mouth 2 (two) times daily with a meal. 180 tablet 1   furosemide (LASIX) 20 MG tablet 1 po qd 90 tablet 3   gabapentin (NEURONTIN) 300 MG capsule Take 1 capsule (300 mg total) by mouth 2 (two) times daily. 180 capsule 0   No facility-administered medications prior to visit.    Allergies  Allergen Reactions   Erythromycin Swelling    Lip swelling; angioedema   Lisinopril Swelling    REACTION: angioedema   Penicillins Hives and Swelling    Swelling in arms & hands   Adhesive [Tape] Rash    ROS Review of Systems  Constitutional:  Negative for appetite change, diaphoresis, fatigue and unexpected weight change.  Eyes:  Negative for pain, redness and visual disturbance.  Respiratory:  Negative for cough, chest tightness, shortness of breath and wheezing.   Cardiovascular:  Negative for chest pain, palpitations and leg swelling.  Endocrine: Negative for cold intolerance, heat intolerance, polydipsia, polyphagia and polyuria.  Genitourinary:  Negative for difficulty urinating, dysuria and frequency.  Neurological:  Positive for numbness. Negative for dizziness, light-headedness and headaches.     Objective:    Physical Exam Vitals and nursing note reviewed.  Constitutional:      Appearance: She is well-developed.  HENT:     Head: Normocephalic and atraumatic.  Eyes:     Conjunctiva/sclera: Conjunctivae normal.  Neck:     Thyroid: No thyromegaly.     Vascular: No carotid bruit or JVD.  Cardiovascular:     Rate and Rhythm: Normal rate and regular rhythm.     Heart sounds: Normal heart sounds. No murmur heard. Pulmonary:     Effort: Pulmonary effort is normal. No respiratory distress.     Breath  sounds: Normal breath sounds. No wheezing or rales.  Chest:     Chest wall: No tenderness.  Musculoskeletal:     Cervical back: Normal range of motion and neck supple.  Neurological:     Mental Status: She is alert and oriented to person, place, and time.    BP 140/70 (BP Location: Right Arm, Patient Position: Sitting, Cuff Size: Large)   Pulse 68   Temp 98.5 F (36.9 C) (Oral)   Resp 18  Ht 5\' 5"  (1.651 m)   Wt 268 lb (121.6 kg)   LMP 01/28/2012   SpO2 97%   BMI 44.60 kg/m  Wt Readings from Last 3 Encounters:  07/07/21 268 lb (121.6 kg)  05/08/21 269 lb (122 kg)  01/03/21 269 lb 3.2 oz (122.1 kg)     Health Maintenance Due  Topic Date Due   Zoster Vaccines- Shingrix (1 of 2) Never done   COLONOSCOPY (Pts 45-80yrs Insurance coverage will need to be confirmed)  09/18/2018   COVID-19 Vaccine (3 - Moderna risk series) 01/31/2021   MAMMOGRAM  06/11/2021    There are no preventive care reminders to display for this patient.  Lab Results  Component Value Date   TSH 1.66 01/03/2021   Lab Results  Component Value Date   WBC 8.8 01/03/2021   HGB 13.6 01/03/2021   HCT 41.8 01/03/2021   MCV 85.4 01/03/2021   PLT 221.0 01/03/2021   Lab Results  Component Value Date   NA 141 01/03/2021   K 4.0 01/03/2021   CO2 29 01/03/2021   GLUCOSE 116 (H) 01/03/2021   BUN 17 01/03/2021   CREATININE 0.71 01/03/2021   BILITOT 1.0 01/03/2021   ALKPHOS 93 01/03/2021   AST 12 01/03/2021   ALT 12 01/03/2021   PROT 6.7 01/03/2021   ALBUMIN 3.9 01/03/2021   CALCIUM 9.3 01/03/2021   ANIONGAP 10 05/19/2019   GFR 90.81 01/03/2021   Lab Results  Component Value Date   CHOL 209 (H) 01/03/2021   Lab Results  Component Value Date   HDL 71.60 01/03/2021   Lab Results  Component Value Date   LDLCALC 126 (H) 01/03/2021   Lab Results  Component Value Date   TRIG 58.0 01/03/2021   Lab Results  Component Value Date   CHOLHDL 3 01/03/2021   Lab Results  Component Value Date    HGBA1C 6.0 11/04/2015      Assessment & Plan:   Problem List Items Addressed This Visit       Unprioritized   Essential hypertension    Well controlled, no changes to meds. Encouraged heart healthy diet such as the DASH diet and exercise as tolerated.       Relevant Medications   carvedilol (COREG) 25 MG tablet   amLODipine (NORVASC) 5 MG tablet   furosemide (LASIX) 20 MG tablet   Hyperlipidemia    Encourage heart healthy diet such as MIND or DASH diet, increase exercise, avoid trans fats, simple carbohydrates and processed foods, consider a krill or fish or flaxseed oil cap daily.       Relevant Medications   carvedilol (COREG) 25 MG tablet   amLODipine (NORVASC) 5 MG tablet   furosemide (LASIX) 20 MG tablet   Other Relevant Orders   TSH   Comprehensive metabolic panel   CBC with Differential/Platelet   Lipid panel   Hypothyroidism    Check labs        Relevant Medications   carvedilol (COREG) 25 MG tablet   Other Relevant Orders   TSH   Other Visit Diagnoses     Need for influenza vaccination    -  Primary   Relevant Orders   Flu Vaccine QUAD 43mo+IM (Fluarix, Fluzone & Alfiuria Quad PF) (Completed)   Primary hypertension       Relevant Medications   carvedilol (COREG) 25 MG tablet   amLODipine (NORVASC) 5 MG tablet   furosemide (LASIX) 20 MG tablet   Other Relevant Orders   TSH  Comprehensive metabolic panel   CBC with Differential/Platelet   Lipid panel   Neuropathy       Relevant Medications   gabapentin (NEURONTIN) 300 MG capsule   Colon cancer screening       Relevant Orders   Ambulatory referral to Gastroenterology   Need for pneumococcal vaccination       Relevant Orders   Pneumococcal conjugate vaccine 20-valent (Prevnar 20) (Completed)       Meds ordered this encounter  Medications   carvedilol (COREG) 25 MG tablet    Sig: Take 1 tablet (25 mg total) by mouth 2 (two) times daily with a meal.    Dispense:  180 tablet    Refill:  1    amLODipine (NORVASC) 5 MG tablet    Sig: Take 1 tablet (5 mg total) by mouth daily. Needs ov before any more refills    Dispense:  90 tablet    Refill:  1   furosemide (LASIX) 20 MG tablet    Sig: 1 po qd    Dispense:  90 tablet    Refill:  3   gabapentin (NEURONTIN) 300 MG capsule    Sig: Take 1 capsule (300 mg total) by mouth 2 (two) times daily.    Dispense:  180 capsule    Refill:  1    Follow-up: Return in about 6 months (around 01/04/2022), or if symptoms worsen or fail to improve, for annual exam, fasting.    Ann Held, DO

## 2021-07-07 NOTE — Patient Instructions (Signed)

## 2022-01-06 ENCOUNTER — Encounter: Payer: BC Managed Care – PPO | Admitting: Family Medicine

## 2022-01-24 ENCOUNTER — Other Ambulatory Visit: Payer: Self-pay | Admitting: Family Medicine

## 2022-01-24 DIAGNOSIS — I10 Essential (primary) hypertension: Secondary | ICD-10-CM

## 2022-02-03 ENCOUNTER — Encounter: Payer: Self-pay | Admitting: Family Medicine

## 2022-02-03 ENCOUNTER — Ambulatory Visit (INDEPENDENT_AMBULATORY_CARE_PROVIDER_SITE_OTHER): Payer: BC Managed Care – PPO | Admitting: Family Medicine

## 2022-02-03 ENCOUNTER — Other Ambulatory Visit: Payer: Self-pay | Admitting: Family Medicine

## 2022-02-03 VITALS — BP 140/90 | HR 63 | Temp 98.7°F | Resp 18 | Ht 65.0 in | Wt 270.6 lb

## 2022-02-03 DIAGNOSIS — Z853 Personal history of malignant neoplasm of breast: Secondary | ICD-10-CM

## 2022-02-03 DIAGNOSIS — I10 Essential (primary) hypertension: Secondary | ICD-10-CM | POA: Diagnosis not present

## 2022-02-03 DIAGNOSIS — Z1211 Encounter for screening for malignant neoplasm of colon: Secondary | ICD-10-CM

## 2022-02-03 DIAGNOSIS — Z23 Encounter for immunization: Secondary | ICD-10-CM

## 2022-02-03 DIAGNOSIS — R739 Hyperglycemia, unspecified: Secondary | ICD-10-CM | POA: Diagnosis not present

## 2022-02-03 DIAGNOSIS — M170 Bilateral primary osteoarthritis of knee: Secondary | ICD-10-CM

## 2022-02-03 DIAGNOSIS — E039 Hypothyroidism, unspecified: Secondary | ICD-10-CM

## 2022-02-03 DIAGNOSIS — Z0000 Encounter for general adult medical examination without abnormal findings: Secondary | ICD-10-CM | POA: Diagnosis not present

## 2022-02-03 DIAGNOSIS — E785 Hyperlipidemia, unspecified: Secondary | ICD-10-CM

## 2022-02-03 DIAGNOSIS — Z170 Estrogen receptor positive status [ER+]: Secondary | ICD-10-CM

## 2022-02-03 DIAGNOSIS — M25562 Pain in left knee: Secondary | ICD-10-CM

## 2022-02-03 DIAGNOSIS — M25561 Pain in right knee: Secondary | ICD-10-CM

## 2022-02-03 DIAGNOSIS — E2839 Other primary ovarian failure: Secondary | ICD-10-CM

## 2022-02-03 DIAGNOSIS — C50412 Malignant neoplasm of upper-outer quadrant of left female breast: Secondary | ICD-10-CM

## 2022-02-03 LAB — TSH: TSH: 1.68 u[IU]/mL (ref 0.35–5.50)

## 2022-02-03 LAB — CBC WITH DIFFERENTIAL/PLATELET
Basophils Absolute: 0.1 10*3/uL (ref 0.0–0.1)
Basophils Relative: 1.2 % (ref 0.0–3.0)
Eosinophils Absolute: 0.2 10*3/uL (ref 0.0–0.7)
Eosinophils Relative: 2.4 % (ref 0.0–5.0)
HCT: 40.7 % (ref 36.0–46.0)
Hemoglobin: 13.1 g/dL (ref 12.0–15.0)
Lymphocytes Relative: 20.4 % (ref 12.0–46.0)
Lymphs Abs: 1.9 10*3/uL (ref 0.7–4.0)
MCHC: 32.1 g/dL (ref 30.0–36.0)
MCV: 85.9 fl (ref 78.0–100.0)
Monocytes Absolute: 0.6 10*3/uL (ref 0.1–1.0)
Monocytes Relative: 6 % (ref 3.0–12.0)
Neutro Abs: 6.5 10*3/uL (ref 1.4–7.7)
Neutrophils Relative %: 70 % (ref 43.0–77.0)
Platelets: 244 10*3/uL (ref 150.0–400.0)
RBC: 4.74 Mil/uL (ref 3.87–5.11)
RDW: 15.1 % (ref 11.5–15.5)
WBC: 9.2 10*3/uL (ref 4.0–10.5)

## 2022-02-03 LAB — COMPREHENSIVE METABOLIC PANEL
ALT: 11 U/L (ref 0–35)
AST: 12 U/L (ref 0–37)
Albumin: 3.7 g/dL (ref 3.5–5.2)
Alkaline Phosphatase: 91 U/L (ref 39–117)
BUN: 13 mg/dL (ref 6–23)
CO2: 30 mEq/L (ref 19–32)
Calcium: 9.3 mg/dL (ref 8.4–10.5)
Chloride: 104 mEq/L (ref 96–112)
Creatinine, Ser: 0.62 mg/dL (ref 0.40–1.20)
GFR: 94.38 mL/min (ref >60.00)
Glucose, Bld: 91 mg/dL (ref 70–99)
Potassium: 4.5 mEq/L (ref 3.5–5.1)
Sodium: 142 mEq/L (ref 135–145)
Total Bilirubin: 0.9 mg/dL (ref 0.2–1.2)
Total Protein: 6.9 g/dL (ref 6.0–8.3)

## 2022-02-03 LAB — LIPID PANEL
Cholesterol: 177 mg/dL (ref 0–200)
HDL: 65.8 mg/dL (ref >39.00)
LDL Cholesterol: 102 mg/dL — ABNORMAL HIGH (ref 0–99)
NonHDL: 111.63
Total CHOL/HDL Ratio: 3
Triglycerides: 47 mg/dL (ref 0.0–149.0)
VLDL: 9.4 mg/dL (ref 0.0–40.0)

## 2022-02-03 LAB — HEMOGLOBIN A1C: Hgb A1c MFr Bld: 6.1 % (ref 4.6–6.5)

## 2022-02-03 MED ORDER — AMLODIPINE BESYLATE 5 MG PO TABS: 5.0000 mg | ORAL_TABLET | Freq: Every day | ORAL | 1 refills | Status: DC

## 2022-02-03 NOTE — Assessment & Plan Note (Signed)
ghm utd Check labs  See avs  

## 2022-02-03 NOTE — Assessment & Plan Note (Signed)
Refer to ortho  Pt has not seen them in 3 years

## 2022-02-03 NOTE — Assessment & Plan Note (Signed)
Encourage heart healthy diet such as MIND or DASH diet, increase exercise, avoid trans fats, simple carbohydrates and processed foods, consider a krill or fish or flaxseed oil cap daily.  °

## 2022-02-03 NOTE — Patient Instructions (Addendum)
[place] for weight loss    Preventive Care 40-64 Years Old, Female Preventive care refers to lifestyle choices and visits with your health care provider that can promote health and wellness. Preventive care visits are also called wellness exams. What can I expect for my preventive care visit? Counseling Your health care provider may ask you questions about your: Medical history, including: Past medical problems. Family medical history. Pregnancy history. Current health, including: Menstrual cycle. Method of birth control. Emotional well-being. Home life and relationship well-being. Sexual activity and sexual health. Lifestyle, including: Alcohol, nicotine or tobacco, and drug use. Access to firearms. Diet, exercise, and sleep habits. Work and work environment. Sunscreen use. Safety issues such as seatbelt and bike helmet use. Physical exam Your health care provider will check your: Height and weight. These may be used to calculate your BMI (body mass index). BMI is a measurement that tells if you are at a healthy weight. Waist circumference. This measures the distance around your waistline. This measurement also tells if you are at a healthy weight and may help predict your risk of certain diseases, such as type 2 diabetes and high blood pressure. Heart rate and blood pressure. Body temperature. Skin for abnormal spots. What immunizations do I need?  Vaccines are usually given at various ages, according to a schedule. Your health care provider will recommend vaccines for you based on your age, medical history, and lifestyle or other factors, such as travel or where you work. What tests do I need? Screening Your health care provider may recommend screening tests for certain conditions. This may include: Lipid and cholesterol levels. Diabetes screening. This is done by checking your blood sugar (glucose) after you have not eaten for a while (fasting). Pelvic exam and Pap  test. Hepatitis B test. Hepatitis C test. HIV (human immunodeficiency virus) test. STI (sexually transmitted infection) testing, if you are at risk. Lung cancer screening. Colorectal cancer screening. Mammogram. Talk with your health care provider about when you should start having regular mammograms. This may depend on whether you have a family history of breast cancer. BRCA-related cancer screening. This may be done if you have a family history of breast, ovarian, tubal, or peritoneal cancers. Bone density scan. This is done to screen for osteoporosis. Talk with your health care provider about your test results, treatment options, and if necessary, the need for more tests. Follow these instructions at home: Eating and drinking  Eat a diet that includes fresh fruits and vegetables, whole grains, lean protein, and low-fat dairy products. Take vitamin and mineral supplements as recommended by your health care provider. Do not drink alcohol if: Your health care provider tells you not to drink. You are pregnant, may be pregnant, or are planning to become pregnant. If you drink alcohol: Limit how much you have to 0-1 drink a day. Know how much alcohol is in your drink. In the [place] one drink equals one 12 oz bottle of beer (355 mL), one 5 oz glass of wine (148 mL), or one 1 oz glass of hard liquor (44 mL). Lifestyle Brush your teeth every morning and night with fluoride toothpaste. Floss one time each day. Exercise for at least 30 minutes 5 or more days each week. Do not use any products that contain nicotine or tobacco. These products include cigarettes, chewing tobacco, and vaping devices, such as e-cigarettes. If you need help quitting, ask your health care provider. Do not use drugs. If you are sexually active, practice safe sex. Use a   condom or other form of protection to prevent STIs. If you do not wish to become pregnant, use a form of birth control. If you plan to become pregnant,  see your health care provider for a prepregnancy visit. Take aspirin only as told by your health care provider. Make sure that you understand how much to take and what form to take. Work with your health care provider to find out whether it is safe and beneficial for you to take aspirin daily. Find healthy ways to manage stress, such as: Meditation, yoga, or listening to music. Journaling. Talking to a trusted person. Spending time with friends and family. Minimize exposure to UV radiation to reduce your risk of skin cancer. Safety Always wear your seat belt while driving or riding in a vehicle. Do not drive: If you have been drinking alcohol. Do not ride with someone who has been drinking. When you are tired or distracted. While texting. If you have been using any mind-altering substances or drugs. Wear a helmet and other protective equipment during sports activities. If you have firearms in your house, make sure you follow all gun safety procedures. Seek help if you have been physically or sexually abused. What's next? Visit your health care provider once a year for an annual wellness visit. Ask your health care provider how often you should have your eyes and teeth checked. Stay up to date on all vaccines. This information is not intended to replace advice given to you by your health care provider. Make sure you discuss any questions you have with your health care provider. Document Revised: 01/29/2021 Document Reviewed: 01/29/2021 Elsevier Patient Education  2023 [employer]  

## 2022-02-03 NOTE — Assessment & Plan Note (Signed)
Per onc  

## 2022-02-03 NOTE — Assessment & Plan Note (Signed)
Well controlled, no changes to meds. Encouraged heart healthy diet such as the DASH diet and exercise as tolerated.  °

## 2022-02-03 NOTE — Progress Notes (Signed)
Subjective:   By signing my name below, I, Shehryar Baig, attest that this documentation has been prepared under the direction and in the presence of Ann Held, DO  02/03/2022    Patient ID: Kayla Banks, female    DOB: 07/31/1956, 66 y.o.   MRN: [id number]  Chief Complaint  Patient presents with   Annual Exam    Pt states fasting     HPI Patient is in today for a comprehensive physical exam.   She is requesting a refill on 5 mg amlodipine daily PO.  She continues seeing her oncologist regularly.  She continues having knee pain but has not followed up with her orthopedic specialist. She is interested in getting treated for her knee pain.  She reports having a flat lesion on her left lower leg that is not bothering her at this time. It has not changed since she discovered it.   She denies having any fever, new moles, congestion, sinus pain, sore throat, chest pain, palpitations, cough, shortness of breath, wheezing, nausea, vomiting, diarrhea, constipation, dysuria, frequency, abdominal pain, hematuria, new muscle pain, new joint pain, headaches. She has no changes to her family medical history. She has no new surgeries to report.  She does not participate in regular exercise.  She has 3 moderna vaccines at this time. She is interested in receiving the shingles vaccines.  She does not have an upcomming dexa scan appointment and is interested in setting up an appointment.    Past Medical History:  Diagnosis Date   Breast cancer ([place])    Complication of anesthesia    Family history of breast cancer    Heart murmur    no problems, saw cardiologist 2010   Hypertension    on meds   Hypothyroidism    Osteoarthritis    Personal history of chemotherapy    Personal history of radiation therapy    left   Plantar fasciitis    PONV (postoperative nausea and vomiting)    Thyroid disease    Hypothyroidism    Past Surgical History:  Procedure Laterality Date    ABDOMINAL HYSTERECTOMY  02/2012   AXILLARY LYMPH NODE DISSECTION Left 11/09/2018   Procedure: LEFT AXILLARY LYMPH NODE DISSECTION;  Surgeon: Erroll Luna, MD;  Location: [place];  Service: General;  Laterality: Left;   BREAST BIOPSY Left    malignant   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/11/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   BREAST LUMPECTOMY WITH RADIOACTIVE SEED AND SENTINEL LYMPH NODE BIOPSY Left 10/11/2018   Procedure: LEFT BREAST LUMPECTOMY WITH RADIOACTIVE SEED AND LEFT SENTINEL LYMPH NODE MAPPING WITH LEFT TARGETED LYMPH NODE BIOPSY;  Surgeon: Erroll Luna, MD;  Location: [place];  Service: General;  Laterality: Left;   CATARACT EXTRACTION Bilateral 11/3  06/26/20   shipiro    FOOT SURGERY Left    bone spurs   LUMBAR LAMINECTOMY  1990   PORT-A-CATH REMOVAL N/A 05/23/2019   Procedure: PORT REMOVAL;  Surgeon: Erroll Luna, MD;  Location: [place];  Service: General;  Laterality: N/A;   PORTACATH PLACEMENT Right 12/01/2018   Procedure: INSERTION PORT-A-CATH WITH ULTRASOUND;  Surgeon: Erroll Luna, MD;  Location: [place];  Service: General;  Laterality: Right;    Family History  Problem Relation Age of Onset   Diabetes Mother    Hyperlipidemia Mother    Hypertension Mother    Hypertension Father    Heart disease Father 70       ?  MI   Hypothyroidism Sister    Hyperthyroidism Sister    Breast cancer Maternal Aunt        dx 60's/70's   Breast cancer Other        dx early 65's    Social History   Socioeconomic History   Marital status: Single    Spouse name: Not on file   Number of children: Not on file   Years of education: Not on file   Highest education level: Not on file  Occupational History   Occupation: [employer]  Tobacco Use   Smoking status: Never   Smokeless tobacco: Never  Substance and Sexual Activity   Alcohol use: Yes    Comment: occassionally   Drug use: No    Sexual activity: Yes    Partners: Male  Other Topics Concern   Not on file  Social History Narrative   Exercise-- no   Social Determinants of Health   Financial Resource Strain: Not on file  Food Insecurity: Not on file  Transportation Needs: Not on file  Physical Activity: Not on file  Stress: Not on file  Social Connections: Not on file  Intimate Partner Violence: Not on file    Outpatient Medications Prior to Visit  Medication Sig Dispense Refill   anastrozole (ARIMIDEX) 1 MG tablet Take 1 tablet (1 mg total) by mouth daily. 90 tablet 3   carvedilol (COREG) 25 MG tablet TAKE 1 TABLET(25 MG) BY MOUTH TWICE DAILY WITH A MEAL 180 tablet 1   EPINEPHRINE 0.3 mg/0.3 mL IJ SOAJ injection INJECT 0.3 ML(1 SYRINGE) IN THE MUSCLE AS NEEDED FOR ALLERGIC REACTION 2 Device 0   furosemide (LASIX) 20 MG tablet 1 po qd 90 tablet 3   gabapentin (NEURONTIN) 300 MG capsule Take 1 capsule (300 mg total) by mouth 2 (two) times daily. 180 capsule 1   ibuprofen (ADVIL) 800 MG tablet Take 1 tablet (800 mg total) by mouth every 8 (eight) hours as needed. 30 tablet 0   LORazepam (ATIVAN) 0.5 MG tablet TAKE 1 TABLET(0.5 MG) BY MOUTH AT BEDTIME AS NEEDED FOR SLEEP 30 tablet 1   meloxicam (MOBIC) 15 MG tablet Take 1 tablet (15 mg total) by mouth daily as needed for pain. 90 tablet 3   SYNTHROID 100 MCG tablet Take 1 tablet (100 mcg total) by mouth daily. 90 tablet 3   valACYclovir (VALTREX) 1000 MG tablet TAKE 1 TABLET BY MOUTH THREE TIMES DAILY AS NEEDED 30 tablet 2   amLODipine (NORVASC) 5 MG tablet Take 1 tablet (5 mg total) by mouth daily. Needs ov before any more refills 90 tablet 1   No facility-administered medications prior to visit.    Allergies  Allergen Reactions   Erythromycin Swelling    Lip swelling; angioedema   Lisinopril Swelling    REACTION: angioedema   Penicillins Hives and Swelling    Swelling in arms & hands   Adhesive [Tape] Rash    Review of Systems  Constitutional:   Negative for fever.  HENT:  Negative for congestion, sinus pain and sore throat.   Respiratory:  Negative for cough, shortness of breath and wheezing.   Cardiovascular:  Negative for chest pain and palpitations.  Gastrointestinal:  Negative for abdominal pain, constipation, diarrhea, nausea and vomiting.  Genitourinary:  Negative for dysuria, frequency and hematuria.  Musculoskeletal:        (-)new muscle pain (-)new joint pain  Skin:        (-)New  moles (+)flat, non-changing lesion on left lower leg  Neurological:  Negative for headaches.       Objective:    Physical Exam Constitutional:      General: She is not in acute distress.    Appearance: Normal appearance. She is not ill-appearing.  HENT:     Head: Normocephalic and atraumatic.     Right Ear: Tympanic membrane, ear canal and external ear normal.     Left Ear: Tympanic membrane, ear canal and external ear normal.  Eyes:     Extraocular Movements: Extraocular movements intact.     Pupils: Pupils are equal, round, and reactive to light.  Cardiovascular:     Rate and Rhythm: Normal rate and regular rhythm.     Heart sounds: Normal heart sounds. No murmur heard.    No gallop.  Pulmonary:     Effort: Pulmonary effort is normal. No respiratory distress.     Breath sounds: Normal breath sounds. No wheezing or rales.  Abdominal:     General: Bowel sounds are normal. There is no distension.     Palpations: Abdomen is soft.     Tenderness: There is no abdominal tenderness. There is no guarding.  Skin:    General: Skin is warm and dry.  Neurological:     Mental Status: She is alert and oriented to person, place, and time.  Psychiatric:        Judgment: Judgment normal.     BP 140/90 (BP Location: Right Arm, Patient Position: Sitting, Cuff Size: Large)   Pulse 63   Temp 98.7 F (37.1 C) (Oral)   Resp 18   Ht '5\' 5"'$  (1.651 m)   Wt 270 lb 9.6 oz (122.7 kg)   LMP 01/28/2012   SpO2 99%   BMI 45.03 kg/m  Wt Readings  from Last 3 Encounters:  02/03/22 270 lb 9.6 oz (122.7 kg)  07/07/21 268 lb (121.6 kg)  05/08/21 269 lb (122 kg)    Diabetic Foot Exam - Simple   No data filed    Lab Results  Component Value Date   WBC 8.1 07/07/2021   HGB 13.1 07/07/2021   HCT 40.6 07/07/2021   PLT 235.0 07/07/2021   GLUCOSE 92 07/07/2021   CHOL 189 07/07/2021   TRIG 54.0 07/07/2021   HDL 63.50 07/07/2021   LDLCALC 115 (H) 07/07/2021   ALT 11 07/07/2021   AST 14 07/07/2021   NA 141 07/07/2021   K 4.6 07/07/2021   CL 103 07/07/2021   CREATININE 0.67 07/07/2021   BUN 11 07/07/2021   CO2 31 07/07/2021   TSH 1.63 07/07/2021   HGBA1C 6.0 11/04/2015    Lab Results  Component Value Date   TSH 1.63 07/07/2021   Lab Results  Component Value Date   WBC 8.1 07/07/2021   HGB 13.1 07/07/2021   HCT 40.6 07/07/2021   MCV 86.3 07/07/2021   PLT 235.0 07/07/2021   Lab Results  Component Value Date   NA 141 07/07/2021   K 4.6 07/07/2021   CO2 31 07/07/2021   GLUCOSE 92 07/07/2021   BUN 11 07/07/2021   CREATININE 0.67 07/07/2021   BILITOT 0.8 07/07/2021   ALKPHOS 89 07/07/2021   AST 14 07/07/2021   ALT 11 07/07/2021   PROT 6.8 07/07/2021   ALBUMIN 3.8 07/07/2021   CALCIUM 9.4 07/07/2021   ANIONGAP 10 05/19/2019   GFR 93.01 07/07/2021   Lab Results  Component Value Date   CHOL 189 07/07/2021   Lab  Results  Component Value Date   HDL 63.50 07/07/2021   Lab Results  Component Value Date   LDLCALC 115 (H) 07/07/2021   Lab Results  Component Value Date   TRIG 54.0 07/07/2021   Lab Results  Component Value Date   CHOLHDL 3 07/07/2021   Lab Results  Component Value Date   HGBA1C 6.0 11/04/2015   Mammogram- Last completed 06/11/2020. Results are normal. Repeat in 1 year.  Colonoscopy- Last completed 09/18/2018. She is due for a colonoscopy and is interested in setting up an appointment.      Assessment & Plan:   Problem List Items Addressed This Visit       Unprioritized   MORBID  OBESITY   Preventative health care - Primary    ghm utd Check labs  See avs      Relevant Orders   CBC with Differential/Platelet   Comprehensive metabolic panel   Lipid panel   TSH   Malignant neoplasm of upper-outer quadrant of left breast in female, estrogen receptor positive ([place])    Per onc       KNEE PAIN, BILATERAL    Refer to ortho  Pt has not seen them in 3 years       Hypothyroidism    Check labs  con't synthroid       Relevant Orders   TSH   Hyperlipidemia    Encourage heart healthy diet such as MIND or DASH diet, increase exercise, avoid trans fats, simple carbohydrates and processed foods, consider a krill or fish or flaxseed oil cap daily.       Relevant Medications   amLODipine (NORVASC) 5 MG tablet   Other Relevant Orders   Comprehensive metabolic panel   Lipid panel   Essential hypertension    Well controlled, no changes to meds. Encouraged heart healthy diet such as the DASH diet and exercise as tolerated.       Relevant Medications   amLODipine (NORVASC) 5 MG tablet   Other Relevant Orders   CBC with Differential/Platelet   Comprehensive metabolic panel   Lipid panel   TSH   Other Visit Diagnoses     Hyperglycemia       Relevant Orders   Comprehensive metabolic panel   Hemoglobin A1c   Colon cancer screening       Relevant Orders   Ambulatory referral to Gastroenterology   Need for shingles vaccine       Relevant Orders   Varicella-zoster vaccine IM (Completed)   Primary osteoarthritis of both knees       Relevant Orders   Ambulatory referral to Orthopedic Surgery   Estrogen deficiency       Relevant Orders   DG Bone Density        Meds ordered this encounter  Medications   amLODipine (NORVASC) 5 MG tablet    Sig: Take 1 tablet (5 mg total) by mouth daily. Needs ov before any more refills    Dispense:  90 tablet    Refill:  1    I, Ann Held, DO, personally preformed the services described in this  documentation.  All medical record entries made by the scribe were at my direction and in my presence.  I have reviewed the chart and discharge instructions (if applicable) and agree that the record reflects my personal performance and is accurate and complete. 02/03/2022   I,Shehryar Baig,acting as a Education administrator for [employer], DO.,have documented all relevant documentation on the  behalf of Ann Held, DO,as directed by  Ann Held, DO while in the presence of Ann Held, DO.   Ann Held, DO

## 2022-02-03 NOTE — Assessment & Plan Note (Signed)
Check labs con't synthroid 

## 2022-02-09 ENCOUNTER — Telehealth: Payer: Self-pay | Admitting: Family Medicine

## 2022-02-09 NOTE — Telephone Encounter (Signed)
[name] ([facility]) called stating that the diagnostic MM that is scheduled for pt tomorrow is missing a signature for the order and she needs that in order to preform the MM.

## 2022-02-09 NOTE — Telephone Encounter (Signed)
Orders faxed with sig

## 2022-02-18 ENCOUNTER — Ambulatory Visit
Admission: RE | Admit: 2022-02-18 | Discharge: 2022-02-18 | Disposition: A | Discharge status: Home / Self Care | Payer: BC Managed Care – PPO | Source: Ambulatory Visit | Attending: Family Medicine | Admitting: Family Medicine

## 2022-02-18 DIAGNOSIS — Z853 Personal history of malignant neoplasm of breast: Secondary | ICD-10-CM

## 2022-03-09 ENCOUNTER — Other Ambulatory Visit: Payer: Self-pay

## 2022-03-09 ENCOUNTER — Other Ambulatory Visit (HOSPITAL_BASED_OUTPATIENT_CLINIC_OR_DEPARTMENT_OTHER): Payer: BC Managed Care – PPO

## 2022-03-09 ENCOUNTER — Encounter (HOSPITAL_BASED_OUTPATIENT_CLINIC_OR_DEPARTMENT_OTHER): Payer: Self-pay

## 2022-03-15 ENCOUNTER — Other Ambulatory Visit: Payer: Self-pay | Admitting: Family Medicine

## 2022-03-15 DIAGNOSIS — M170 Bilateral primary osteoarthritis of knee: Secondary | ICD-10-CM

## 2022-04-07 ENCOUNTER — Ambulatory Visit: Payer: BC Managed Care – PPO

## 2022-04-29 ENCOUNTER — Ambulatory Visit: Payer: BC Managed Care – PPO

## 2022-05-05 ENCOUNTER — Ambulatory Visit (HOSPITAL_BASED_OUTPATIENT_CLINIC_OR_DEPARTMENT_OTHER)
Admission: RE | Admit: 2022-05-05 | Discharge: 2022-05-05 | Disposition: A | Discharge status: Home / Self Care | Payer: BC Managed Care – PPO | Source: Ambulatory Visit | Attending: Family Medicine | Admitting: Family Medicine

## 2022-05-05 ENCOUNTER — Ambulatory Visit (INDEPENDENT_AMBULATORY_CARE_PROVIDER_SITE_OTHER): Payer: BC Managed Care – PPO

## 2022-05-05 DIAGNOSIS — Z23 Encounter for immunization: Secondary | ICD-10-CM

## 2022-05-05 DIAGNOSIS — E2839 Other primary ovarian failure: Secondary | ICD-10-CM | POA: Diagnosis present

## 2022-05-05 NOTE — Progress Notes (Signed)
Per orders of Dr. Etter Sjogren, injection of Shingrix given by Petra Kuba, [employer] student, direct supervision by Gerilyn Nestle. Patient tolerated injection well.

## 2022-05-06 NOTE — Progress Notes (Signed)
Patient Care Team: Carollee Herter, Alferd Apa, DO as PCP - General Dorna Leitz, MD as Consulting Physician (Orthopedic Surgery) Erroll Luna, MD as Consulting Physician (General Surgery) Nicholas Lose, MD as Consulting Physician (Hematology and Oncology) Kyung Rudd, MD as Consulting Physician (Radiation Oncology) Rutherford Guys, MD as Consulting Physician (Ophthalmology)  DIAGNOSIS: No diagnosis found.  SUMMARY OF ONCOLOGIC HISTORY: Oncology History  Malignant neoplasm of upper-outer quadrant of left breast in female, estrogen receptor positive ([place])  08/24/2018 Initial Biopsy   Screening detected left breast mass at 2:30 position 6 mm in size with one abnormal lymph node and stable benign calcifications, biopsy of the mass grade 2 IDC with DCIS and lymphovascular invasion, lymph node biopsy is positive, ER 95%, PR 40%, Ki-67 20%, HER-2 -1+ by IHC, T1b N1 stage Ib   08/31/2018 Cancer Staging   Staging form: Breast, AJCC 8th Edition - Clinical: Stage IB (cT1b, cN1(f), cM0, G2, ER+, PR+, HER2-) - Signed by Nicholas Lose, MD on 08/31/2018   09/21/2018 Genetic Testing   Genetic testing performed through [facility]'s Common Hereditary Cancers Panel reported out on 09/19/2018 showed no pathogenic mutations.  The Common Hereditary Cancer Panel offered by [facility] includes sequencing and/or deletion duplication testing of the following 53 genes: APC, ATM, AXIN2, BARD1, BMPR1A, BRCA1, BRCA2, BRIP1, BUB1B, CDH1, CDK4, CDKN2A, CHEK2, CTNNA1, DICER1, ENG, EPCAM, GALNT12, GREM1, HOXB13, KIT, MEN1, MLH1, MLH3, MSH2, MSH3, MSH6, MUTYH, NBN, NF1, NTHL1, PALB2, PDGFRA, PMS2, POLD1, POLE, PTEN, RAD50, RAD51C, RAD51D, RNF43, RPS20, SDHA, SDHB, SDHC, SDHD, SMAD4, SMARCA4, STK11, TP53, TSC1, TSC2, VHL.   A variant of uncertain significance (VUS) in a gene called NBN was also noted. c.1979G>C (p.Arg660Thr)   10/18/2018 Cancer Staging   Staging form: Breast, AJCC 8th Edition - Pathologic stage from 10/18/2018: Stage IB  (pT1b, pN2a, cM0, G2, ER+, PR+, HER2-) - Signed by Nicholas Lose, MD on 11/14/2018   11/09/2018 Surgery   Left lumpectomy ([place]) [phone]): Grade 2 IDC with DCIS, 1 cm, margins negative, DCIS focally 0.1 cm from superior margin, 2/2 lymph nodes positive, ER 95%, PR 45%, HER-2 negative, Ki-67 20% AXLND: 3/20 LN Pos (total 5/22)   12/15/2018 -  Chemotherapy   DOXOrubicin (ADRIAMYCIN) chemo injection 142 mg, 60 mg/m2 = 142 mg, Intravenous,  Once, 4 of 4 cycles. Administration: 142 mg (12/15/2018), 142 mg (12/29/2018), 142 mg (01/12/2019), 142 mg (01/26/2019)  palonosetron (ALOXI) injection 0.25 mg, 0.25 mg, Intravenous,  Once, 4 of 4 cycles. Administration: 0.25 mg (12/15/2018), 0.25 mg (12/29/2018), 0.25 mg (01/12/2019), 0.25 mg (01/26/2019)  pegfilgrastim-cbqv (UDENYCA) injection 6 mg, 6 mg, Subcutaneous, Once, 4 of 4 cycles. Administration: 6 mg (12/17/2018), 6 mg (12/31/2018), 6 mg (01/14/2019), 6 mg (01/28/2019)  cyclophosphamide (CYTOXAN) 1,420 mg in sodium chloride 0.9 % 250 mL chemo infusion, 600 mg/m2 = 1,420 mg, Intravenous,  Once, 4 of 4 cycles. Administration: 1,420 mg (12/15/2018), 1,420 mg (12/29/2018), 1,420 mg (01/12/2019), 1,420 mg (01/26/2019)  PACLitaxel (TAXOL) 192 mg in sodium chloride 0.9 % 250 mL chemo infusion (</= 75m/m2), 80 mg/m2 = 192 mg, Intravenous,  Once, 11 of 12 cycles. Dose modification: 60 mg/m2 (original dose 80 mg/m2, Cycle 12, Reason: Dose not tolerated). Administration: 192 mg (02/09/2019), 192 mg (02/16/2019), 192 mg (02/23/2019), 192 mg (03/02/2019), 192 mg (03/09/2019), 192 mg (03/16/2019), 192 mg (03/23/2019), 144 mg (03/30/2019), 144 mg (04/06/2019), 144 mg (04/14/2019), 144 mg (04/20/2019)  fosaprepitant (EMEND) 150 mg  dexamethasone (DECADRON) 12 mg in sodium chloride 0.9 % 145 mL IVPB, , Intravenous,  Once, 4 of 4 cycles  Administration:  (12/15/2018),  (12/29/2018),  (01/12/2019),  (01/26/2019)     06/01/2019 - 07/21/2019 Radiation Therapy   The patient initially received a dose  of 50.4 Gy in 28 fractions to the breast using whole-breast tangent fields. This was delivered using a 3-D conformal technique. The pt received a boost delivering an additional 10 Gy in 5 fractions using a electron boost with 50mV electrons. The total dose was 60.4 Gy.    08/2019 - 08/2024 Anti-estrogen oral therapy   Anastrozole     CHIEF COMPLIANT:   INTERVAL HISTORY: Kayla Banks a   ALLERGIES:  is allergic to erythromycin, lisinopril, penicillins, and adhesive [tape].  MEDICATIONS:  Current Outpatient Medications  Medication Sig Dispense Refill   meloxicam (MOBIC) 15 MG tablet Take 1 tablet (15 mg total) by mouth daily as needed for pain. 30 tablet 1   amLODipine (NORVASC) 5 MG tablet Take 1 tablet (5 mg total) by mouth daily. Needs ov before any more refills 90 tablet 1   anastrozole (ARIMIDEX) 1 MG tablet Take 1 tablet (1 mg total) by mouth daily. 90 tablet 3   carvedilol (COREG) 25 MG tablet TAKE 1 TABLET(25 MG) BY MOUTH TWICE DAILY WITH A MEAL 180 tablet 1   EPINEPHRINE 0.3 mg/0.3 mL IJ SOAJ injection INJECT 0.3 ML(1 SYRINGE) IN THE MUSCLE AS NEEDED FOR ALLERGIC REACTION 2 Device 0   furosemide (LASIX) 20 MG tablet 1 po qd 90 tablet 3   gabapentin (NEURONTIN) 300 MG capsule Take 1 capsule (300 mg total) by mouth 2 (two) times daily. 180 capsule 1   ibuprofen (ADVIL) 800 MG tablet Take 1 tablet (800 mg total) by mouth every 8 (eight) hours as needed. 30 tablet 0   LORazepam (ATIVAN) 0.5 MG tablet TAKE 1 TABLET(0.5 MG) BY MOUTH AT BEDTIME AS NEEDED FOR SLEEP 30 tablet 1   SYNTHROID 100 MCG tablet Take 1 tablet (100 mcg total) by mouth daily. 90 tablet 3   valACYclovir (VALTREX) 1000 MG tablet TAKE 1 TABLET BY MOUTH THREE TIMES DAILY AS NEEDED 30 tablet 2   No current facility-administered medications for this visit.    PHYSICAL EXAMINATION: ECOG PERFORMANCE STATUS: {CHL ONC ECOG [id number]:[phone]}  There were no vitals filed for this visit. There were no vitals filed for  this visit.  BREAST:*** No palpable masses or nodules in either right or left breasts. No palpable axillary supraclavicular or infraclavicular adenopathy no breast tenderness or nipple discharge. (exam performed in the presence of a chaperone)  LABORATORY DATA:  I have reviewed the data as listed    Latest Ref Rng & Units 02/03/2022   12:03 PM 07/07/2021   11:39 AM 01/03/2021   10:40 AM  CMP  Glucose 70 - 99 mg/dL 91  92  116   BUN 6 - 23 mg/dL _0 Creatinine 0.40 - 1.20 mg/dL 0.62  0.67  0.71   Sodium 135 - 145 mEq/L 142  141  141   Potassium 3.5 - 5.1 mEq/L 4.5  4.6  4.0   Chloride 96 - 112 mEq/L 104  103  104   CO2 19 - 32 mEq/L _1 Calcium 8.4 - 10.5 mg/dL 9.3  9.4  9.3   Total Protein 6.0 - 8.3 g/dL 6.9  6.8  6.7   Total Bilirubin 0.2 - 1.2 mg/dL 0.9  0.8  1.0   Alkaline Phos 39 - 117 U/L 91  89  93  AST 0 - 37 U/L _0 ALT 0 - 35 U/L _1 Lab Results  Component Value Date   WBC 9.2 02/03/2022   HGB 13.1 02/03/2022   HCT 40.7 02/03/2022   MCV 85.9 02/03/2022   PLT 244.0 02/03/2022   NEUTROABS 6.5 02/03/2022    ASSESSMENT & PLAN:  No problem-specific Assessment & Plan notes found for this encounter.    No orders of the defined types were placed in this encounter.  The patient has a good understanding of the overall plan. she agrees with it. she will call with any problems that may develop before the next visit here. Total time spent: 30 mins including face to face time and time spent for planning, charting and co-ordination of care   Kayla Banks, [place] 05/06/22    I Gardiner Coins am scribing for Dr. Lindi Adie  ***

## 2022-05-07 ENCOUNTER — Inpatient Hospital Stay: Payer: BC Managed Care – PPO | Attending: Hematology and Oncology | Admitting: Hematology and Oncology

## 2022-05-07 ENCOUNTER — Other Ambulatory Visit: Payer: Self-pay

## 2022-05-07 DIAGNOSIS — C50412 Malignant neoplasm of upper-outer quadrant of left female breast: Secondary | ICD-10-CM | POA: Insufficient documentation

## 2022-05-07 DIAGNOSIS — Z170 Estrogen receptor positive status [ER+]: Secondary | ICD-10-CM | POA: Diagnosis not present

## 2022-05-07 DIAGNOSIS — Z79899 Other long term (current) drug therapy: Secondary | ICD-10-CM | POA: Diagnosis not present

## 2022-05-07 DIAGNOSIS — Z79811 Long term (current) use of aromatase inhibitors: Secondary | ICD-10-CM | POA: Insufficient documentation

## 2022-05-07 MED ORDER — ANASTROZOLE 1 MG PO TABS: 1.0000 mg | ORAL_TABLET | Freq: Every day | ORAL | 3 refills | Status: DC

## 2022-05-07 NOTE — Assessment & Plan Note (Addendum)
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 Adj chemo with DD Adriamycin and Cytoxan followed by Taxol weekly X 11 (stopped for neuropathy)completed 04/19/2019 2. Adj XRT10/15/2020-07/21/2019 3. Adj Anti estrogen therapy --------------------------------------------------------------------------------------------- Current treatment: Anastrozole 1 mg dailystarted 07/26/2019 Anastrozoletoxicities:Occasional hot flashes but otherwise tolerating it fairly well. Trigger finger most likely caused by anastrozole. She is seeing orthopedics.  Breast cancer surveillance: 1.Breast exam  02/18/2022: Benign 2.Mammogram: 06/11/2020: Benign, breast density category B. Bone density 05/05/2022: T score of 0.3: Normal   Breast lymphedema and left arm lymphedema: Stable  Peripheral neuropathy: Chemotherapy-induced. she is currently on gabapentin but rates her symptoms as 7 out of 10.  I recommended participating in the ACCRUE neuropathy clinical trial.  Return to clinic in 1 year for follow-up

## 2022-05-08 IMAGING — MG DIGITAL DIAGNOSTIC BILAT W/ TOMO W/ CAD
6 of 10 series · 6 of 26 positions shown · non-contrast
Comparison: Previous exam(s).

CLINICAL DATA: Left lumpectomy.  Annual mammography.

EXAM:
DIGITAL DIAGNOSTIC BILATERAL MAMMOGRAM WITH TOMO AND CAD

[L CC]
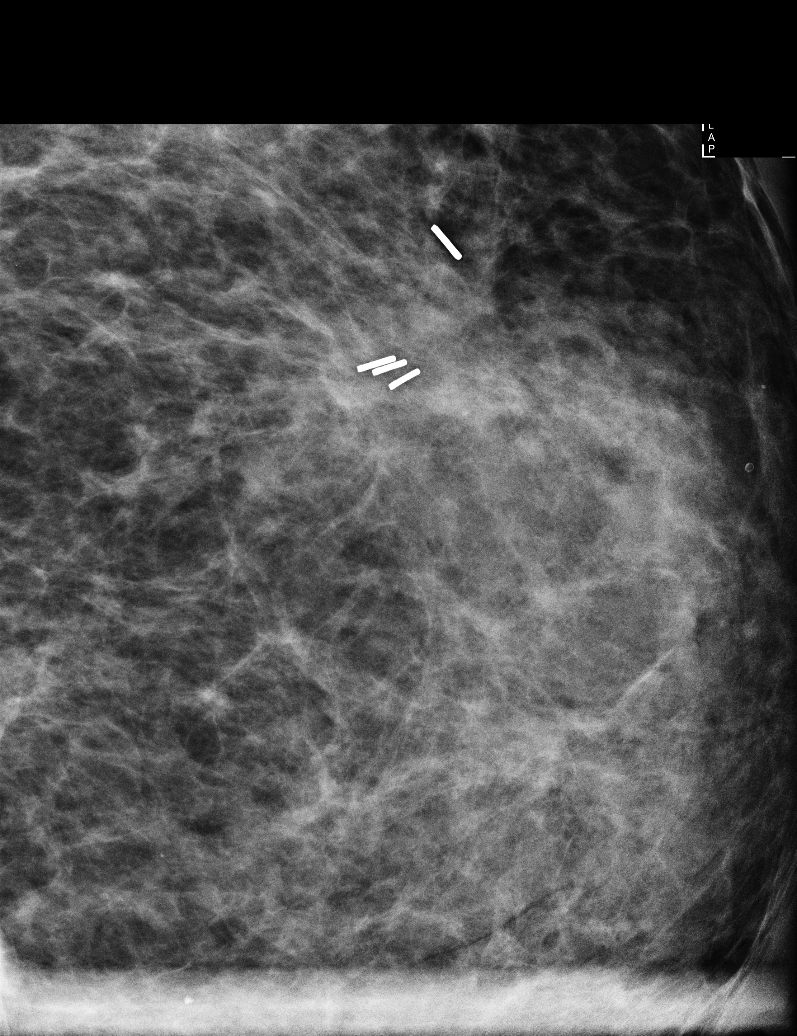

[L MLO synth-2D]
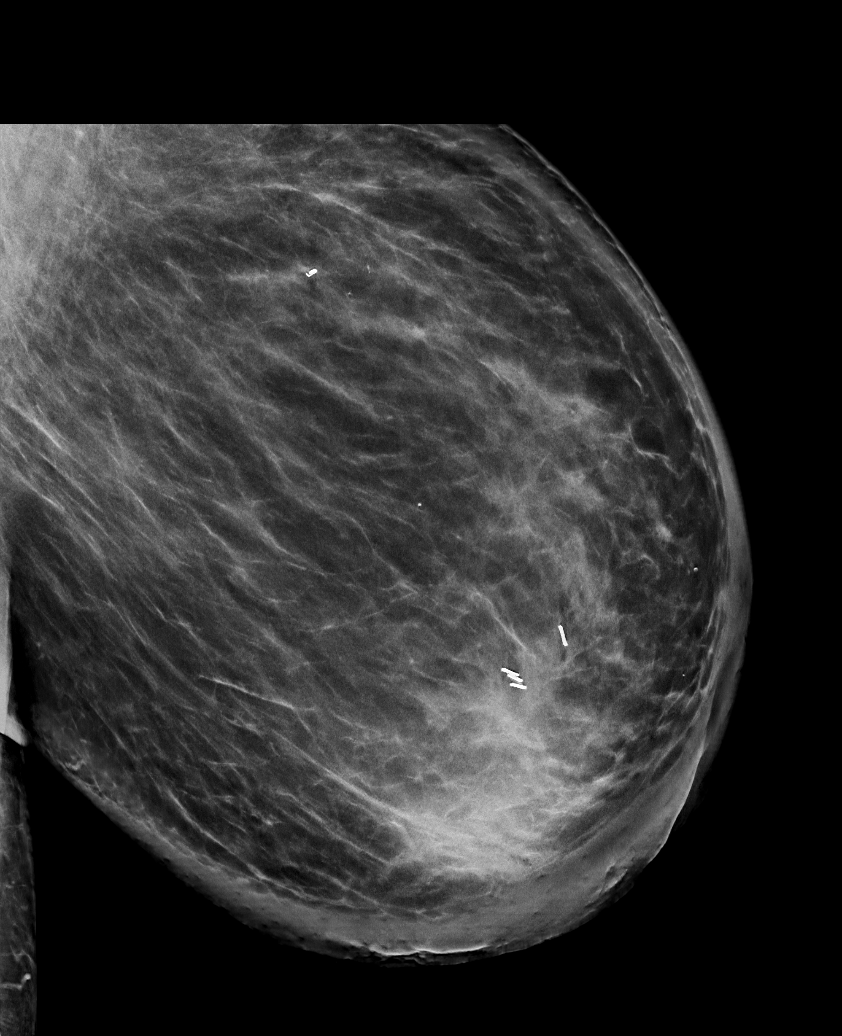

[L CC synth-2D]
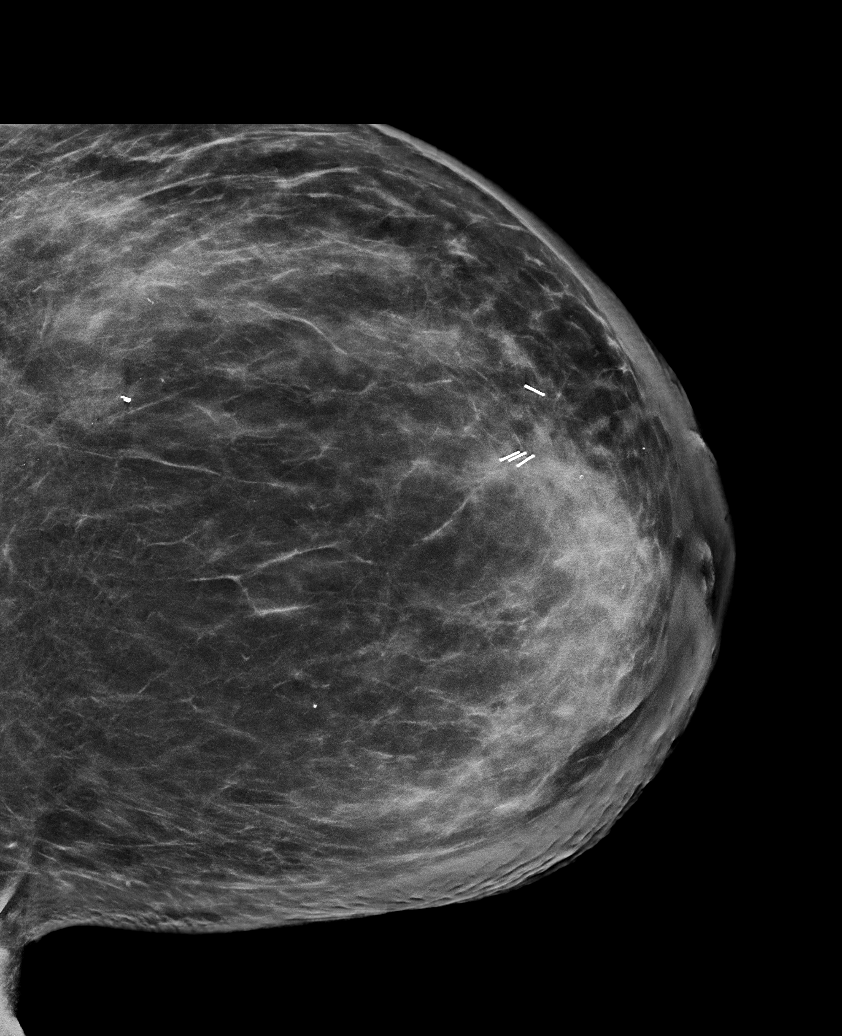

[R CC synth-2D]
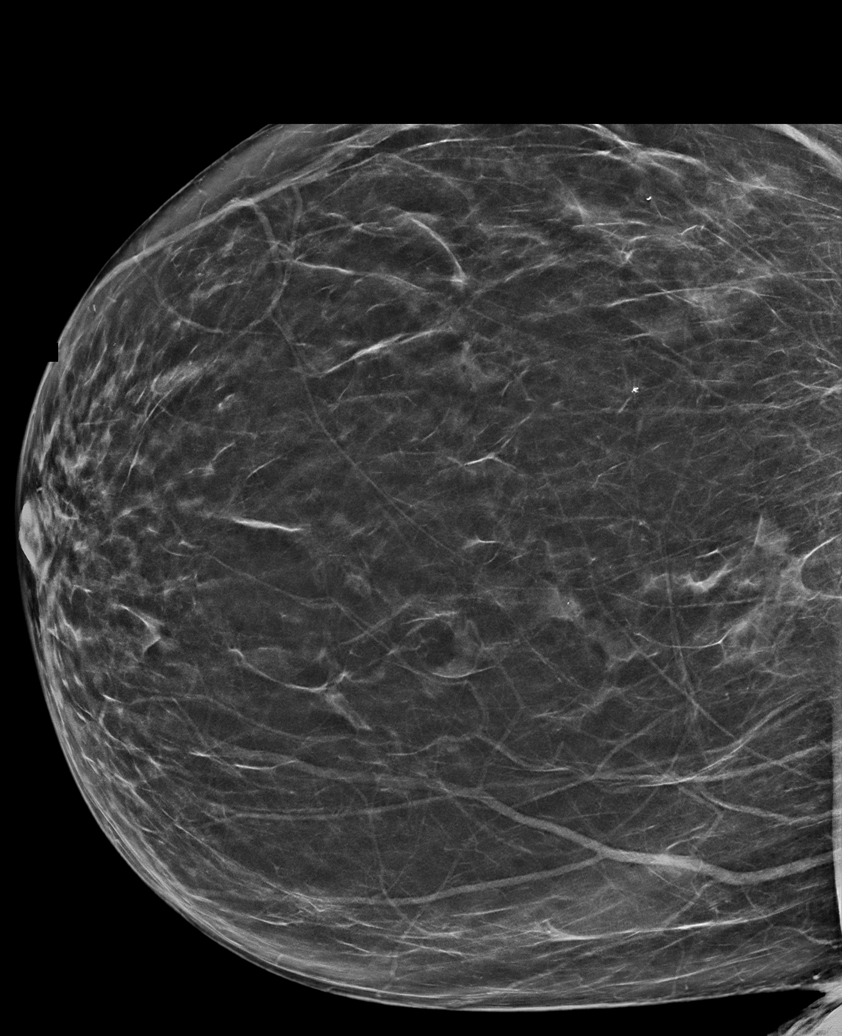

[R MLO synth-2D (1 of 2)]
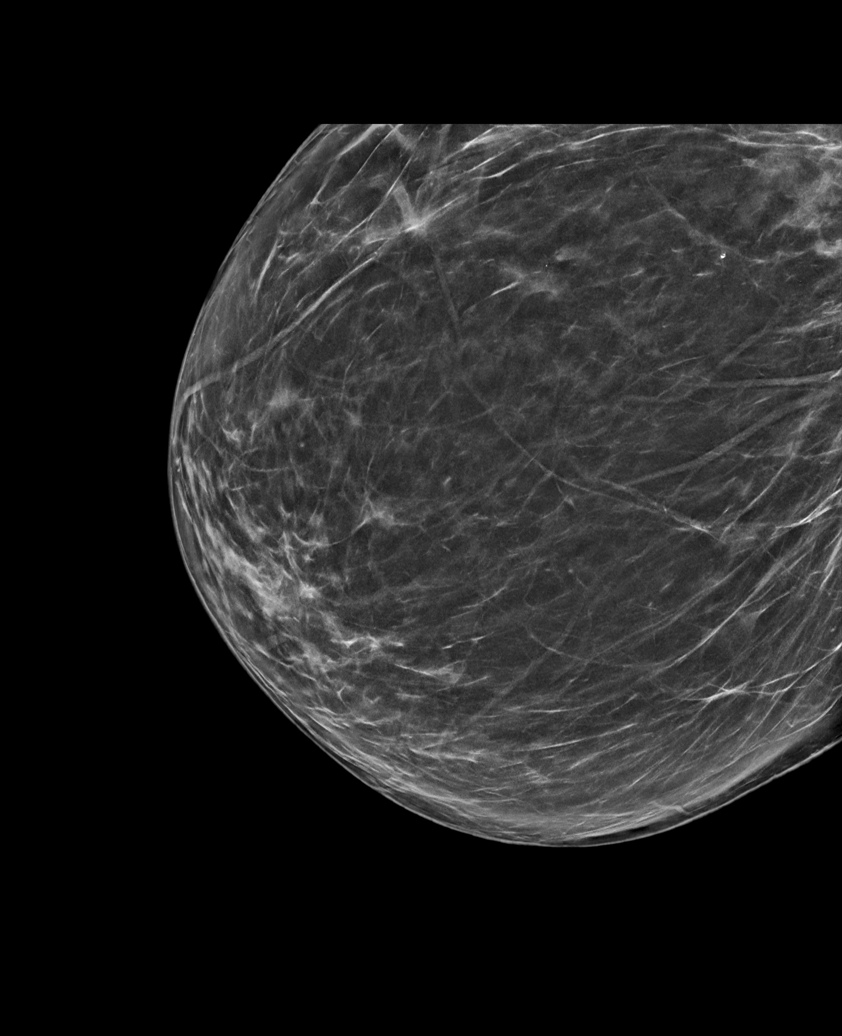

[R MLO synth-2D (2 of 2)]
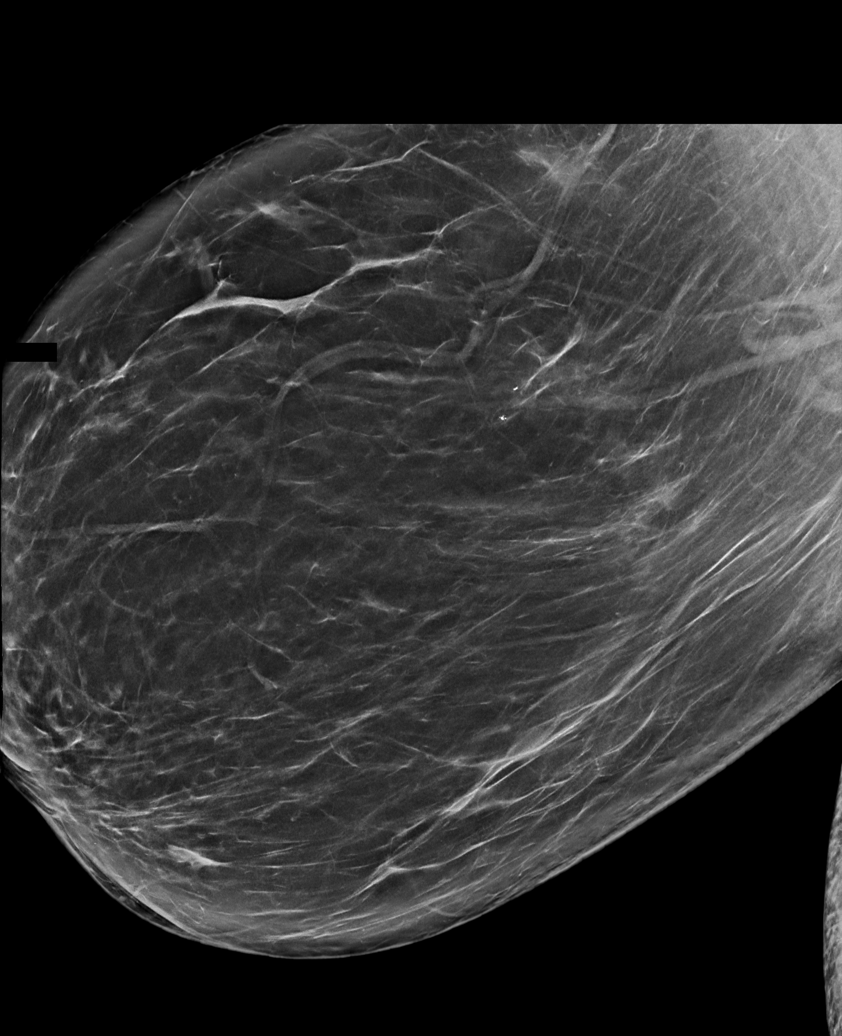

[6 of 26 positions shown; findings below may reference images not displayed]

ACR Breast Density Category b: There are scattered areas of
fibroglandular density.
FINDINGS: No suspicious masses, calcifications, or distortion are identified.
The left lumpectomy site appears as expected.

Mammographic images were processed with CAD.
IMPRESSION: No mammographic evidence of malignancy.

RECOMMENDATION:
Annual diagnostic mammography.

I have discussed the findings and recommendations with the patient.
If applicable, a reminder letter will be sent to the patient
regarding the next appointment.

BI-RADS CATEGORY  2: Benign.

## 2022-05-12 ENCOUNTER — Telehealth: Payer: Self-pay | Admitting: Hematology and Oncology

## 2022-05-12 NOTE — Telephone Encounter (Signed)
Scheduled appointment per 9/21 los. Left voicemail.

## 2022-05-14 ENCOUNTER — Other Ambulatory Visit: Payer: Self-pay | Admitting: Family Medicine

## 2022-05-14 DIAGNOSIS — M170 Bilateral primary osteoarthritis of knee: Secondary | ICD-10-CM

## 2022-07-02 ENCOUNTER — Encounter: Payer: Self-pay | Admitting: Family Medicine

## 2022-07-02 DIAGNOSIS — M170 Bilateral primary osteoarthritis of knee: Secondary | ICD-10-CM

## 2022-07-02 DIAGNOSIS — E039 Hypothyroidism, unspecified: Secondary | ICD-10-CM

## 2022-07-02 DIAGNOSIS — G629 Polyneuropathy, unspecified: Secondary | ICD-10-CM

## 2022-07-02 MED ORDER — MELOXICAM 15 MG PO TABS: 15.0000 mg | ORAL_TABLET | Freq: Every day | ORAL | 1 refills | Status: DC | PRN

## 2022-07-02 MED ORDER — GABAPENTIN 300 MG PO CAPS: 300.0000 mg | ORAL_CAPSULE | Freq: Two times a day (BID) | ORAL | 1 refills | Status: DC

## 2022-07-02 MED ORDER — SYNTHROID 100 MCG PO TABS: 100.0000 ug | ORAL_TABLET | Freq: Every day | ORAL | 1 refills | Status: DC

## 2022-07-28 ENCOUNTER — Other Ambulatory Visit: Payer: Self-pay | Admitting: Hematology and Oncology

## 2022-10-30 ENCOUNTER — Ambulatory Visit
Admission: RE | Admit: 2022-10-30 | Discharge: 2022-10-30 | Disposition: A | Discharge status: Home / Self Care | Payer: BC Managed Care – PPO | Source: Ambulatory Visit | Attending: Family Medicine | Admitting: Family Medicine

## 2022-10-30 VITALS — BP 135/77 | HR 75 | Temp 98.2°F | Resp 16

## 2022-10-30 DIAGNOSIS — Z1152 Encounter for screening for COVID-19: Secondary | ICD-10-CM | POA: Insufficient documentation

## 2022-10-30 DIAGNOSIS — R059 Cough, unspecified: Secondary | ICD-10-CM | POA: Diagnosis present

## 2022-10-30 DIAGNOSIS — J069 Acute upper respiratory infection, unspecified: Secondary | ICD-10-CM | POA: Diagnosis not present

## 2022-10-30 LAB — POCT INFLUENZA A/B
Influenza A, POC: NEGATIVE
Influenza B, POC: NEGATIVE

## 2022-10-30 MED ORDER — BENZONATATE 100 MG PO CAPS: 100.0000 mg | ORAL_CAPSULE | Freq: Three times a day (TID) | ORAL | 0 refills | Status: DC | PRN

## 2022-10-30 NOTE — ED Triage Notes (Signed)
Wednesday started with pain in right side of neck, back, shoulder area. Yesterday began with headache, runny nose, nasal congestion, sore throat, cough, generalized aches, and chills. Denies abdominal pain, N/V/D, documented fever. Taking aleve and advil

## 2022-10-30 NOTE — Discharge Instructions (Addendum)
The flu test is negative.   You have been swabbed for COVID, and the test will result in the next 24 hours. Our staff will call you if positive. If the COVID test is positive, you should quarantine until you are fever free for 24 hours and you are starting to feel better, and then take added precautions for the next 5 days, such as physical distancing/wearing a mask and good hand hygiene/washing.  Take benzonatate 100 mg, 1 tab every 8 hours as needed for cough.  It might be best to stick with Tylenol for your aches and pain, especially if you take a daily anti-inflammatory like meloxicam.

## 2022-10-30 NOTE — ED Provider Notes (Addendum)
[facility]    CSN: [id number] Arrival date & time: 10/30/22  1349      History   Chief Complaint Chief Complaint  Patient presents with   Influenza    Been having flu-like symptoms since Wednesday evening, 3/13-sore throat, coughing, sneezing, runny nose. Slight fever 3/14 @ 99.1. Having neck-shoulder-upper back pain periodically though that pain is not as bad as it was on Wednesday evening. - Entered by patient    HPI Kayla Banks is a 67 y.o. female.    Influenza  Here for myalgia and cough and sore throat.  Her temperature has been mildly elevated at 99.1.  Symptoms began on March 13.  Past Medical History:  Diagnosis Date   Breast cancer ([place])    Complication of anesthesia    Family history of breast cancer    Heart murmur    no problems, saw cardiologist 2010   Hypertension    on meds   Hypothyroidism    Osteoarthritis    Personal history of chemotherapy    Personal history of radiation therapy    left   Plantar fasciitis    PONV (postoperative nausea and vomiting)    Thyroid disease    Hypothyroidism    Patient Active Problem List   Diagnosis Date Noted   Chemotherapy-induced peripheral neuropathy ([place]) 03/30/2019   Port-A-Cath in place 12/15/2018   Genetic testing 09/21/2018   Family history of breast cancer    Malignant neoplasm of upper-outer quadrant of left breast in female, estrogen receptor positive ([place]) 08/29/2018   Hyperlipidemia 08/04/2018   Obesity (BMI 30-39.9) 05/09/2013   Preventative health care 11/19/2010   URI (upper respiratory infection) 11/19/2010   KNEE PAIN, BILATERAL 09/25/2010   SINUSITIS - ACUTE-NOS 12/24/2009   MORBID OBESITY 06/14/2009   COLD SORE 10/16/2008   SKIN TAG 06/18/2008   Hypothyroidism 03/17/2007   Essential hypertension 03/17/2007    Past Surgical History:  Procedure Laterality Date   ABDOMINAL HYSTERECTOMY  02/2012   AXILLARY LYMPH NODE DISSECTION Left 11/09/2018   Procedure: LEFT  AXILLARY LYMPH NODE DISSECTION;  Surgeon: Erroll Luna, MD;  Location: [place];  Service: General;  Laterality: Left;   BREAST BIOPSY Left    malignant   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/11/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   BREAST LUMPECTOMY WITH RADIOACTIVE SEED AND SENTINEL LYMPH NODE BIOPSY Left 10/11/2018   Procedure: LEFT BREAST LUMPECTOMY WITH RADIOACTIVE SEED AND LEFT SENTINEL LYMPH NODE MAPPING WITH LEFT TARGETED LYMPH NODE BIOPSY;  Surgeon: Erroll Luna, MD;  Location: [place];  Service: General;  Laterality: Left;   CATARACT EXTRACTION Bilateral 11/3  06/26/20   shipiro    FOOT SURGERY Left    bone spurs   LUMBAR LAMINECTOMY  1990   PORT-A-CATH REMOVAL N/A 05/23/2019   Procedure: PORT REMOVAL;  Surgeon: Erroll Luna, MD;  Location: [place];  Service: General;  Laterality: N/A;   PORTACATH PLACEMENT Right 12/01/2018   Procedure: INSERTION PORT-A-CATH WITH ULTRASOUND;  Surgeon: Erroll Luna, MD;  Location: [place];  Service: General;  Laterality: Right;    OB History   No obstetric history on file.      Home Medications    Prior to Admission medications   Medication Sig Start Date End Date Taking? Authorizing Provider  amLODipine (NORVASC) 5 MG tablet Take 1 tablet (5 mg total) by mouth daily. Needs ov before any more refills 02/03/22  Yes Ann Held,  DO  anastrozole (ARIMIDEX) 1 MG tablet TAKE 1 TABLET(1 MG) BY MOUTH DAILY 07/28/22  Yes Nicholas Lose, MD  benzonatate (TESSALON) 100 MG capsule Take 1 capsule (100 mg total) by mouth 3 (three) times daily as needed for cough. 10/30/22  Yes Barrett Henle, MD  carvedilol (COREG) 25 MG tablet TAKE 1 TABLET(25 MG) BY MOUTH TWICE DAILY WITH A MEAL 01/26/22  Yes Roma Schanz R, DO  gabapentin (NEURONTIN) 300 MG capsule Take 1 capsule (300 mg total) by mouth 2 (two) times daily. 07/02/22  Yes Roma Schanz R, DO  meloxicam  (MOBIC) 15 MG tablet Take 1 tablet (15 mg total) by mouth daily as needed for pain. 07/02/22  Yes Lowne Chase, Yvonne R, DO  SYNTHROID 100 MCG tablet Take 1 tablet (100 mcg total) by mouth daily. 07/02/22  Yes Roma Schanz R, DO  EPINEPHRINE 0.3 mg/0.3 mL IJ SOAJ injection INJECT 0.3 ML(1 SYRINGE) IN THE MUSCLE AS NEEDED FOR ALLERGIC REACTION 01/14/17   Carollee Herter, Alferd Apa, DO  LORazepam (ATIVAN) 0.5 MG tablet TAKE 1 TABLET(0.5 MG) BY MOUTH AT BEDTIME AS NEEDED FOR SLEEP 01/23/19   Nicholas Lose, MD  valACYclovir (VALTREX) 1000 MG tablet TAKE 1 TABLET BY MOUTH THREE TIMES DAILY AS NEEDED 01/01/20   Ann Held, DO    Family History Family History  Problem Relation Age of Onset   Diabetes Mother    Hyperlipidemia Mother    Hypertension Mother    Hypertension Father    Heart disease Father 42       ?MI   Hypothyroidism Sister    Hyperthyroidism Sister    Breast cancer Maternal Aunt        dx 60's/70's   Breast cancer Other        dx early 34's    Social History Social History   Tobacco Use   Smoking status: Never   Smokeless tobacco: Never  Substance Use Topics   Alcohol use: Yes    Comment: occassionally   Drug use: No     Allergies   Erythromycin, Lisinopril, Penicillins, and Adhesive [tape]   Review of Systems Review of Systems   Physical Exam Triage Vital Signs ED Triage Vitals [10/30/22 1418]  Enc Vitals Group     BP 135/77     Pulse Rate 75     Resp 16     Temp 98.2 F (36.8 C)     Temp Source Oral     SpO2 95 %     Weight      Height      Head Circumference      Peak Flow      Pain Score      Pain Loc      Pain Edu?      Excl. in [place]?    No data found.  Updated Vital Signs BP 135/77 (BP Location: Right Arm)   Pulse 75   Temp 98.2 F (36.8 C) (Oral)   Resp 16   LMP 01/28/2012   SpO2 95%   Visual Acuity Right Eye Distance:   Left Eye Distance:   Bilateral Distance:    Right Eye Near:   Left Eye Near:    Bilateral  Near:     Physical Exam Vitals reviewed.  Constitutional:      General: She is not in acute distress.    Appearance: She is not toxic-appearing.  HENT:     Nose: Nose normal.  Mouth/Throat:     Mouth: Mucous membranes are moist.     Pharynx: No oropharyngeal exudate or posterior oropharyngeal erythema.  Eyes:     Extraocular Movements: Extraocular movements intact.     Conjunctiva/sclera: Conjunctivae normal.     Pupils: Pupils are equal, round, and reactive to light.  Cardiovascular:     Rate and Rhythm: Normal rate and regular rhythm.     Heart sounds: No murmur heard. Pulmonary:     Effort: Pulmonary effort is normal. No respiratory distress.     Breath sounds: No stridor. No wheezing, rhonchi or rales.  Musculoskeletal:     Cervical back: Neck supple.  Lymphadenopathy:     Cervical: No cervical adenopathy.  Skin:    Capillary Refill: Capillary refill takes less than 2 seconds.     Coloration: Skin is not jaundiced or pale.  Neurological:     General: No focal deficit present.     Mental Status: She is alert and oriented to person, place, and time.  Psychiatric:        Behavior: Behavior normal.      UC Treatments / Results  Labs (all labs ordered are listed, but only abnormal results are displayed) Labs Reviewed  SARS CORONAVIRUS 2 (TAT 6-24 HRS)  POCT INFLUENZA A/B    EKG   Radiology No results found.  Procedures Procedures (including critical care time)  Medications Ordered in UC Medications - No data to display  Initial Impression / Assessment and Plan / UC Course  I have reviewed the triage vital signs and the nursing notes.  Pertinent labs & imaging results that were available during my care of the patient were reviewed by me and considered in my medical decision making (see chart for details).          Flu test is negative.  She will be swabbed for COVID and if positive she is a candidate for Paxlovid. Last eGFR was 94    Final  Clinical Impressions(s) / UC Diagnoses   Final diagnoses:  Viral URI with cough     Discharge Instructions      The flu test is negative.   You have been swabbed for COVID, and the test will result in the next 24 hours. Our staff will call you if positive. If the COVID test is positive, you should quarantine until you are fever free for 24 hours and you are starting to feel better, and then take added precautions for the next 5 days, such as physical distancing/wearing a mask and good hand hygiene/washing.  Take benzonatate 100 mg, 1 tab every 8 hours as needed for cough.  It might be best to stick with Tylenol for your aches and pain, especially if you take a daily anti-inflammatory like meloxicam.       ED Prescriptions     Medication Sig Dispense Auth. Provider   benzonatate (TESSALON) 100 MG capsule Take 1 capsule (100 mg total) by mouth 3 (three) times daily as needed for cough. 21 capsule Barrett Henle, MD      PDMP not reviewed this encounter.   Barrett Henle, MD 10/30/22 1458    Barrett Henle, MD 10/30/22 [phone]

## 2022-10-31 LAB — SARS CORONAVIRUS 2 (TAT 6-24 HRS): SARS Coronavirus 2: NEGATIVE

## 2022-12-30 ENCOUNTER — Other Ambulatory Visit: Payer: Self-pay | Admitting: Hematology and Oncology

## 2023-01-08 ENCOUNTER — Other Ambulatory Visit: Payer: Self-pay | Admitting: Family Medicine

## 2023-01-08 DIAGNOSIS — E039 Hypothyroidism, unspecified: Secondary | ICD-10-CM

## 2023-02-15 ENCOUNTER — Other Ambulatory Visit: Payer: Self-pay | Admitting: Family Medicine

## 2023-02-15 DIAGNOSIS — I10 Essential (primary) hypertension: Secondary | ICD-10-CM

## 2023-02-20 ENCOUNTER — Other Ambulatory Visit: Payer: Self-pay | Admitting: Family Medicine

## 2023-02-20 DIAGNOSIS — E039 Hypothyroidism, unspecified: Secondary | ICD-10-CM

## 2023-04-02 ENCOUNTER — Ambulatory Visit
Admission: EM | Admit: 2023-04-02 | Discharge: 2023-04-02 | Disposition: A | Discharge status: Home / Self Care | Payer: BC Managed Care – PPO | Source: Home / Self Care

## 2023-04-02 ENCOUNTER — Emergency Department (HOSPITAL_COMMUNITY)
Admission: EM | Admit: 2023-04-02 | Discharge: 2023-04-03 | Disposition: A | Discharge status: Home / Self Care | Payer: BC Managed Care – PPO | Attending: Emergency Medicine | Admitting: Emergency Medicine

## 2023-04-02 ENCOUNTER — Emergency Department (HOSPITAL_COMMUNITY): Payer: BC Managed Care – PPO

## 2023-04-02 ENCOUNTER — Encounter (HOSPITAL_COMMUNITY): Payer: Self-pay | Admitting: Emergency Medicine

## 2023-04-02 ENCOUNTER — Other Ambulatory Visit: Payer: Self-pay

## 2023-04-02 DIAGNOSIS — M549 Dorsalgia, unspecified: Secondary | ICD-10-CM

## 2023-04-02 DIAGNOSIS — I10 Essential (primary) hypertension: Secondary | ICD-10-CM | POA: Insufficient documentation

## 2023-04-02 DIAGNOSIS — X58XXXA Exposure to other specified factors, initial encounter: Secondary | ICD-10-CM | POA: Diagnosis not present

## 2023-04-02 DIAGNOSIS — R109 Unspecified abdominal pain: Secondary | ICD-10-CM | POA: Insufficient documentation

## 2023-04-02 DIAGNOSIS — S2242XA Multiple fractures of ribs, left side, initial encounter for closed fracture: Secondary | ICD-10-CM

## 2023-04-02 DIAGNOSIS — R911 Solitary pulmonary nodule: Secondary | ICD-10-CM | POA: Insufficient documentation

## 2023-04-02 DIAGNOSIS — Z79899 Other long term (current) drug therapy: Secondary | ICD-10-CM | POA: Insufficient documentation

## 2023-04-02 DIAGNOSIS — R918 Other nonspecific abnormal finding of lung field: Secondary | ICD-10-CM

## 2023-04-02 DIAGNOSIS — Z853 Personal history of malignant neoplasm of breast: Secondary | ICD-10-CM | POA: Insufficient documentation

## 2023-04-02 DIAGNOSIS — M25511 Pain in right shoulder: Secondary | ICD-10-CM | POA: Insufficient documentation

## 2023-04-02 DIAGNOSIS — U071 COVID-19: Secondary | ICD-10-CM

## 2023-04-02 DIAGNOSIS — I160 Hypertensive urgency: Secondary | ICD-10-CM

## 2023-04-02 DIAGNOSIS — S29001A Unspecified injury of muscle and tendon of front wall of thorax, initial encounter: Secondary | ICD-10-CM | POA: Diagnosis present

## 2023-04-02 LAB — URINALYSIS, W/ REFLEX TO CULTURE (INFECTION SUSPECTED)
Bilirubin Urine: NEGATIVE
Glucose, UA: NEGATIVE mg/dL
Hgb urine dipstick: NEGATIVE
Ketones, ur: NEGATIVE mg/dL
Nitrite: NEGATIVE
Protein, ur: NEGATIVE mg/dL
Specific Gravity, Urine: 1.021 (ref 1.005–1.030)
pH: 5 (ref 5.0–8.0)

## 2023-04-02 LAB — COMPREHENSIVE METABOLIC PANEL
ALT: 15 U/L (ref 0–44)
AST: 19 U/L (ref 15–41)
Albumin: 4.1 g/dL (ref 3.5–5.0)
Alkaline Phosphatase: 122 U/L (ref 38–126)
Anion gap: 12 (ref 5–15)
BUN: 17 mg/dL (ref 8–23)
CO2: 27 mmol/L (ref 22–32)
Calcium: 10.3 mg/dL (ref 8.9–10.3)
Chloride: 101 mmol/L (ref 98–111)
Creatinine, Ser: 0.54 mg/dL (ref 0.44–1.00)
GFR, Estimated: >60 mL/min (ref >60)
Glucose, Bld: 130 mg/dL — ABNORMAL HIGH (ref 70–99)
Potassium: 3.5 mmol/L (ref 3.5–5.1)
Sodium: 140 mmol/L (ref 135–145)
Total Bilirubin: 1 mg/dL (ref 0.3–1.2)
Total Protein: 8.7 g/dL — ABNORMAL HIGH (ref 6.5–8.1)

## 2023-04-02 LAB — CBC WITH DIFFERENTIAL/PLATELET
Abs Immature Granulocytes: 0.04 10*3/uL (ref 0.00–0.07)
Basophils Absolute: 0 10*3/uL (ref 0.0–0.1)
Basophils Relative: 0 %
Eosinophils Absolute: 0.1 10*3/uL (ref 0.0–0.5)
Eosinophils Relative: 1 %
HCT: 45.8 % (ref 36.0–46.0)
Hemoglobin: 14.5 g/dL (ref 12.0–15.0)
Immature Granulocytes: 0 %
Lymphocytes Relative: 5 %
Lymphs Abs: 0.6 10*3/uL — ABNORMAL LOW (ref 0.7–4.0)
MCH: 27.1 pg (ref 26.0–34.0)
MCHC: 31.7 g/dL (ref 30.0–36.0)
MCV: 85.4 fL (ref 80.0–100.0)
Monocytes Absolute: 0.7 10*3/uL (ref 0.1–1.0)
Monocytes Relative: 7 %
Neutro Abs: 9.6 10*3/uL — ABNORMAL HIGH (ref 1.7–7.7)
Neutrophils Relative %: 87 %
Platelets: 259 10*3/uL (ref 150–400)
RBC: 5.36 MIL/uL — ABNORMAL HIGH (ref 3.87–5.11)
RDW: 14.3 % (ref 11.5–15.5)
WBC: 11.1 10*3/uL — ABNORMAL HIGH (ref 4.0–10.5)
nRBC: 0 % (ref 0.0–0.2)

## 2023-04-02 LAB — LIPASE, BLOOD: Lipase: 23 U/L (ref 11–51)

## 2023-04-02 LAB — TROPONIN I (HIGH SENSITIVITY)
Troponin I (High Sensitivity): 10 ng/L (ref <18)
Troponin I (High Sensitivity): 10 ng/L (ref <18)

## 2023-04-02 LAB — SARS CORONAVIRUS 2 BY RT PCR: SARS Coronavirus 2 by RT PCR: POSITIVE — AB

## 2023-04-02 MED ORDER — ACETAMINOPHEN 325 MG PO TABS
650.0000 mg | ORAL_TABLET | Freq: Once | ORAL | Status: AC
  Administered 2023-04-02: 650 mg via ORAL
  Filled 2023-04-02: qty 2

## 2023-04-02 MED ORDER — IOHEXOL 350 MG/ML SOLN
100.0000 mL | Freq: Once | INTRAVENOUS | Status: AC | PRN
  Administered 2023-04-02: 100 mL via INTRAVENOUS

## 2023-04-02 MED ORDER — HYDROCODONE-ACETAMINOPHEN 5-325 MG PO TABS: 1.0000 | ORAL_TABLET | ORAL | 0 refills | Status: DC | PRN

## 2023-04-02 MED ORDER — PAXLOVID (300/100) 20 X 150 MG & 10 X 100MG PO TBPK: 3.0000 | ORAL_TABLET | Freq: Two times a day (BID) | ORAL | 0 refills | Status: AC

## 2023-04-02 MED ORDER — MORPHINE SULFATE (PF) 4 MG/ML IV SOLN
4.0000 mg | Freq: Once | INTRAVENOUS | Status: AC
  Administered 2023-04-02: 4 mg via INTRAVENOUS
  Filled 2023-04-02: qty 1

## 2023-04-02 MED ORDER — AMLODIPINE BESYLATE 5 MG PO TABS
10.0000 mg | ORAL_TABLET | Freq: Once | ORAL | Status: AC
  Administered 2023-04-02: 10 mg via ORAL
  Filled 2023-04-02: qty 2

## 2023-04-02 NOTE — ED Provider Notes (Signed)
[facility]    CSN: [id number] Arrival date & time: 04/02/23  1551      History   Chief Complaint Chief Complaint  Patient presents with   Back Pain    HPI Kayla Banks is a 67 y.o. female.   Patient presents to urgent care for evaluation of pain that started to the left breastbone a couple of days ago, then moved to the right shoulder blade yesterday, and is now to the bilateral upper back.  Pain is mostly localized to the left upper back at this time and is worse with movement and with taking deep breaths.  Specifically, she experiences significant pain when attempting to turn over in the bed, get out of the bed, and when reaching upwards with both arms.  Pain does not radiate to the arm and she denies new paresthesias/extremity weakness.  History of left-sided breast cancer treated in 2020 with surgery, chemo, and radiation.  She still takes oral chemotherapy daily.  History of chemotherapy-induced peripheral neuropathy, states this does not feel similar.  Reports minor dry cough that started last night.  States it is "hard to take in a big deep breath" and she has noticed some "shallow breathing" over the last couple of days.  No recent surgeries, urinary symptoms, flank pain, leg swelling, or orthopnea.  Denies anterior chest wall pain, heart palpitations, nausea, vomiting, dizziness, and fever/chills.  She has not attempted treatment of symptoms prior to arrival.  Blood pressure noted to be very elevated from baseline at 193/84 and remains elevated on recheck.  History of hypertension, taking medications as prescribed (carvedilol, amlodipine).  She has taken her blood pressure medication this morning and states her blood pressure is usually in the 140s over 80s at the highest.  Follows with PCP for management of hypertension.   Back Pain   Past Medical History:  Diagnosis Date   Breast cancer ([facility])    Complication of anesthesia    Family history of breast cancer     Heart murmur    no problems, saw cardiologist 2010   Hypertension    on meds   Hypothyroidism    Osteoarthritis    Personal history of chemotherapy    Personal history of radiation therapy    left   Plantar fasciitis    PONV (postoperative nausea and vomiting)    Thyroid disease    Hypothyroidism    Patient Active Problem List   Diagnosis Date Noted   Chemotherapy-induced peripheral neuropathy ([facility]) 03/30/2019   Port-A-Cath in place 12/15/2018   Genetic testing 09/21/2018   Family history of breast cancer    Malignant neoplasm of upper-outer quadrant of left breast in female, estrogen receptor positive ([facility]) 08/29/2018   Hyperlipidemia 08/04/2018   Obesity (BMI 30-39.9) 05/09/2013   Preventative health care 11/19/2010   URI (upper respiratory infection) 11/19/2010   KNEE PAIN, BILATERAL 09/25/2010   SINUSITIS - ACUTE-NOS 12/24/2009   MORBID OBESITY 06/14/2009   COLD SORE 10/16/2008   SKIN TAG 06/18/2008   Hypothyroidism 03/17/2007   Essential hypertension 03/17/2007    Past Surgical History:  Procedure Laterality Date   ABDOMINAL HYSTERECTOMY  02/2012   AXILLARY LYMPH NODE DISSECTION Left 11/09/2018   Procedure: LEFT AXILLARY LYMPH NODE DISSECTION;  Surgeon: Harriette Bouillon, MD;  Location: [facility];  Service: General;  Laterality: Left;   BREAST BIOPSY Left    malignant   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/11/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   BREAST LUMPECTOMY  WITH RADIOACTIVE SEED AND SENTINEL LYMPH NODE BIOPSY Left 10/11/2018   Procedure: LEFT BREAST LUMPECTOMY WITH RADIOACTIVE SEED AND LEFT SENTINEL LYMPH NODE MAPPING WITH LEFT TARGETED LYMPH NODE BIOPSY;  Surgeon: Harriette Bouillon, MD;  Location: [facility];  Service: General;  Laterality: Left;   CATARACT EXTRACTION Bilateral 11/3  06/26/20   shipiro    FOOT SURGERY Left    bone spurs   LUMBAR LAMINECTOMY  1990   PORT-A-CATH REMOVAL N/A 05/23/2019   Procedure: PORT REMOVAL;   Surgeon: Harriette Bouillon, MD;  Location: [facility];  Service: General;  Laterality: N/A;   PORTACATH PLACEMENT Right 12/01/2018   Procedure: INSERTION PORT-A-CATH WITH ULTRASOUND;  Surgeon: Harriette Bouillon, MD;  [facility];  Service: General;  Laterality: Right;    OB History   No obstetric history on file.      Home Medications    Prior to Admission medications   Medication Sig Start Date End Date Taking? Authorizing Provider  anastrozole (ARIMIDEX) 1 MG tablet TAKE 1 TABLET(1 MG) BY MOUTH DAILY 07/28/22  Yes Serena Croissant, MD  carvedilol (COREG) 25 MG tablet Take 1 tablet (25 mg total) by mouth 2 (two) times daily with a meal. Due for appt 03/2023 02/15/23  Yes Lowne Florina Ou R, DO  LORazepam (ATIVAN) 0.5 MG tablet TAKE 1 TABLET(0.5 MG) BY MOUTH AT BEDTIME AS NEEDED FOR SLEEP 01/23/19  Yes Serena Croissant, MD  meloxicam (MOBIC) 15 MG tablet Take 1 tablet (15 mg total) by mouth daily as needed for pain. 07/02/22  Yes Donato Schultz, DO  SYNTHROID 100 MCG tablet Take 1 tablet (100 mcg total) by mouth daily before breakfast. 01/08/23  Yes Zola Button, Yvonne R, DO  amLODipine (NORVASC) 5 MG tablet Take 1 tablet (5 mg total) by mouth daily. Needs ov before any more refills 02/03/22   Zola Button, Myrene Buddy R, DO  benzonatate (TESSALON) 100 MG capsule Take 1 capsule (100 mg total) by mouth 3 (three) times daily as needed for cough. 10/30/22   Zenia Resides, MD  EPINEPHRINE 0.3 mg/0.3 mL IJ SOAJ injection INJECT 0.3 ML(1 SYRINGE) IN THE MUSCLE AS NEEDED FOR ALLERGIC REACTION 01/14/17   Zola Button, Grayling Congress, DO  gabapentin (NEURONTIN) 300 MG capsule Take 1 capsule (300 mg total) by mouth 2 (two) times daily. 07/02/22   Donato Schultz, DO  valACYclovir (VALTREX) 1000 MG tablet TAKE 1 TABLET BY MOUTH THREE TIMES DAILY AS NEEDED 01/01/20   Donato Schultz, DO    Family History Family History  Problem Relation Age of Onset   Diabetes Mother     Hyperlipidemia Mother    Hypertension Mother    Hypertension Father    Heart disease Father 63       ?MI   Hypothyroidism Sister    Hyperthyroidism Sister    Breast cancer Maternal Aunt        dx 60's/70's   Breast cancer Other        dx early 57's    Social History Social History   Tobacco Use   Smoking status: Never   Smokeless tobacco: Never  Vaping Use   Vaping status: Never Used  Substance Use Topics   Alcohol use: Not Currently    Comment: occassionally   Drug use: Never     Allergies   Erythromycin, Lisinopril, Penicillins, and Adhesive [tape]   Review of Systems Review of Systems  Musculoskeletal:  Positive for back pain.  Per HPI   Physical Exam Triage Vital Signs ED Triage Vitals  Encounter Vitals Group     BP 04/02/23 1604 (!) 199/96     Systolic BP Percentile --      Diastolic BP Percentile --      Pulse Rate 04/02/23 1604 80     Resp 04/02/23 1604 18     Temp 04/02/23 1604 99.4 F (37.4 C)     Temp Source 04/02/23 1604 Oral     SpO2 04/02/23 1604 98 %     Weight 04/02/23 1601 270 lb (122.5 kg)     Height 04/02/23 1601 5\' 6"  (1.676 m)     Head Circumference --      Peak Flow --      Pain Score 04/02/23 1559 8     Pain Loc --      Pain Education --      Exclude from Growth Chart --    No data found.  Updated Vital Signs BP (!) 193/84 (BP Location: Right Arm) Comment: pt states she has taken her BP meds for today  Pulse 80   Temp 99.4 F (37.4 C) (Oral)   Resp 18   Ht 5\' 6"  (1.676 m)   Wt 270 lb (122.5 kg)   LMP 01/28/2012   SpO2 98%   BMI 43.58 kg/m   Visual Acuity Right Eye Distance:   Left Eye Distance:   Bilateral Distance:    Right Eye Near:   Left Eye Near:    Bilateral Near:     Physical Exam Vitals and nursing note reviewed.  Constitutional:      Appearance: She is not ill-appearing or toxic-appearing.  HENT:     Head: Normocephalic and atraumatic.     Right Ear: Hearing and external ear normal.     Left  Ear: Hearing and external ear normal.     Nose: Nose normal.     Mouth/Throat:     Lips: Pink.  Eyes:     General: Lids are normal. Vision grossly intact. Gaze aligned appropriately.     Extraocular Movements: Extraocular movements intact.     Conjunctiva/sclera: Conjunctivae normal.  Cardiovascular:     Rate and Rhythm: Normal rate and regular rhythm.     Heart sounds: Normal heart sounds, S1 normal and S2 normal.  Pulmonary:     Effort: Pulmonary effort is normal. No respiratory distress.     Breath sounds: Normal breath sounds and air entry. No decreased breath sounds, wheezing, rhonchi or rales.     Comments: Normal breath sounds, speaks in full sentences without difficulty. Chest:     Chest wall: No swelling or tenderness.     Comments: No chest wall tenderness. Musculoskeletal:       Arms:     Cervical back: Neck supple.     Comments: Point tender to area indicated above to the left upper back/left side.  No overlying rash to skin or signs of injury.  Strength and sensation intact to extremities.  Skin:    General: Skin is warm and dry.     Capillary Refill: Capillary refill takes less than 2 seconds.     Findings: No rash.  Neurological:     General: No focal deficit present.     Mental Status: She is alert and oriented to person, place, and time. Mental status is at baseline.     Cranial Nerves: No dysarthria or facial asymmetry.  Psychiatric:  Mood and Affect: Mood normal.        Speech: Speech normal.        Behavior: Behavior normal.        Thought Content: Thought content normal.        Judgment: Judgment normal.      UC Treatments / Results  Labs (all labs ordered are listed, but only abnormal results are displayed) Labs Reviewed - No data to display  EKG   Radiology No results found.  Procedures Procedures (including critical care time)  Medications Ordered in UC Medications - No data to display  Initial Impression / Assessment and Plan /  UC Course  I have reviewed the triage vital signs and the nursing notes.  Pertinent labs & imaging results that were available during my care of the patient were reviewed by me and considered in my medical decision making (see chart for details).   1.  Upper back pain of left side, elevated blood pressure reading with diagnosis of hypertension Unclear etiology of patient's left upper back discomfort.  Pain has a degree of reproducibility on exam, therefore question musculoskeletal etiology versus acute cardiopulmonary abnormality.  Viral URI symptoms present as well, question viral illness etiology.  Given patient's history of immunosuppression, malignancy, and elevated BP, I would like for her to go to the nearest emergency department for further workup and evaluation of this to rule out emergent etiology of pain. Lungs clear, vital signs stable patient to go to the nearest emergency department with family member via POV.  Discharged in stable condition.  Discussed risks of deferring ED visit to which she expresses understanding and agreement with plan.     Final Clinical Impressions(s) / UC Diagnoses   Final diagnoses:  Upper back pain on left side  Elevated blood pressure reading with diagnosis of hypertension     Discharge Instructions      Please go to the nearest emergency department for further evaluation and management of your symptoms.     ED Prescriptions   None    PDMP not reviewed this encounter.   Carlisle Beers, [place] 04/02/23 1756

## 2023-04-02 NOTE — Discharge Instructions (Addendum)
It is very important to take your blood pressure medicine as prescribed.  We are also prescribing you Paxlovid to help treat your COVID-19 infection.  Your CT scan shows multiple spots in your lungs that could be from metastatic cancer disease.  You will need to follow-up closely with Dr. Pamelia Hoit.  Call their office on Monday.  Otherwise, we are giving you a short course of hydrocodone to help for your left chest/rib pain.  These could be from some fractures.  Follow-up with your primary care physician in regards to this as well as the high blood pressure.  If you develop new or worsening pain, uncontrolled fever, trouble breathing, or any other new/concerning symptoms then return to the ER or call 911.

## 2023-04-02 NOTE — ED Provider Notes (Signed)
[place] EMERGENCY DEPARTMENT AT [facility] Provider Note   CSN: [id number] Arrival date & time: 04/02/23  1747     History  Chief Complaint  Patient presents with   Flank Pain    Kayla Banks is a 67 y.o. female.  HPI 67 year old female presents with left-sided flank pain and right trapezius pain.  Symptoms started a few days ago.  She has a prior history of breast cancer status post radiation.  She also has a history of hypertension.  This pain was atraumatic and seems to be getting worse.  She went to urgent care but because her blood pressure was so high she was sent here.  She states that she has been compliant with her carvedilol but has not taken her Norvasc for over 1 month due to not liking the leg swelling it causes.  She states she has a slight headache today.  No focal weakness or numbness.  No midline back pain, abdominal pain.  She does not feel short of breath but sometimes the pain hurts when she breathes.  She is has a little bit of chest pressure on the left side of her chest that has been there all day that she thinks is from the bra she is wearing.  Pain right now is a 7-9 in her left flank.  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  She has been taking some Advil. Pain seems to worse with certain movements/positions.  Home Medications Prior to Admission medications   Medication Sig Start Date End Date Taking? Authorizing Provider  amLODipine (NORVASC) 5 MG tablet Take 1 tablet (5 mg total) by mouth daily. Needs ov before any more refills 02/03/22   Zola Button, Myrene Buddy R, DO  anastrozole (ARIMIDEX) 1 MG tablet TAKE 1 TABLET(1 MG) BY MOUTH DAILY 07/28/22   Serena Croissant, MD  benzonatate (TESSALON) 100 MG capsule Take 1 capsule (100 mg total) by mouth 3 (three) times daily as needed for cough. 10/30/22   Zenia Resides, MD  carvedilol (COREG) 25 MG tablet Take 1 tablet (25 mg total) by mouth 2 (two) times daily with a meal.  Due for appt 03/2023 02/15/23   Zola Button, Grayling Congress, DO  EPINEPHRINE 0.3 mg/0.3 mL IJ SOAJ injection INJECT 0.3 ML(1 SYRINGE) IN THE MUSCLE AS NEEDED FOR ALLERGIC REACTION 01/14/17   Zola Button, Grayling Congress, DO  gabapentin (NEURONTIN) 300 MG capsule Take 1 capsule (300 mg total) by mouth 2 (two) times daily. 07/02/22   Seabron Spates R, DO  LORazepam (ATIVAN) 0.5 MG tablet TAKE 1 TABLET(0.5 MG) BY MOUTH AT BEDTIME AS NEEDED FOR SLEEP 01/23/19   Serena Croissant, MD  meloxicam (MOBIC) 15 MG tablet Take 1 tablet (15 mg total) by mouth daily as needed for pain. 07/02/22   Donato Schultz, DO  SYNTHROID 100 MCG tablet Take 1 tablet (100 mcg total) by mouth daily before breakfast. 01/08/23   Zola Button, Grayling Congress, DO  valACYclovir (VALTREX) 1000 MG tablet TAKE 1 TABLET BY MOUTH THREE TIMES DAILY AS NEEDED 01/01/20   Donato Schultz, DO      Allergies    Erythromycin, Lisinopril, Penicillins, and Adhesive [tape]    Review of Systems   Review of Systems  Respiratory:  Positive for cough (developed a cough last night').   Cardiovascular:  Positive for chest pain.  Gastrointestinal:  Negative for abdominal pain.  Genitourinary:  Positive for flank pain.  Musculoskeletal:  Negative for back pain.  Neurological:  Positive for headaches. Negative for weakness and numbness.    Physical Exam Updated Vital Signs BP (!) 183/88   Pulse 85   Temp (!) 101.7 F (38.7 C) (Oral)   Resp 18   LMP 01/28/2012   SpO2 96%  Physical Exam Vitals and nursing note reviewed.  Constitutional:      General: She is not in acute distress.    Appearance: She is well-developed. She is obese. She is not ill-appearing or diaphoretic.  HENT:     Head: Normocephalic and atraumatic.  Eyes:     Extraocular Movements: Extraocular movements intact.     Pupils: Pupils are equal, round, and reactive to light.  Cardiovascular:     Rate and Rhythm: Normal rate and regular rhythm.     Heart sounds: Murmur heard.   Pulmonary:     Effort: Pulmonary effort is normal.     Breath sounds: Normal breath sounds.  Abdominal:     Palpations: Abdomen is soft.     Tenderness: There is no abdominal tenderness. There is no right CVA tenderness or left CVA tenderness.       Comments: No rash c/w zoster.  Musculoskeletal:     Comments: No pain to the trapezius bilaterally  Skin:    General: Skin is warm and dry.  Neurological:     Mental Status: She is alert.     Comments: CN 3-12 grossly intact. 5/5 strength in all 4 extremities. Grossly normal sensation. Normal finger to nose.      ED Results / Procedures / Treatments   Labs (all labs ordered are listed, but only abnormal results are displayed) Labs Reviewed  SARS CORONAVIRUS 2 BY RT PCR - Abnormal; Notable for the following components:      Result Value   SARS Coronavirus 2 by RT PCR POSITIVE (*)    All other components within normal limits  COMPREHENSIVE METABOLIC PANEL - Abnormal; Notable for the following components:   Glucose, Bld 130 (*)    Total Protein 8.7 (*)    All other components within normal limits  CBC WITH DIFFERENTIAL/PLATELET - Abnormal; Notable for the following components:   WBC 11.1 (*)    RBC 5.36 (*)    Neutro Abs 9.6 (*)    Lymphs Abs 0.6 (*)    All other components within normal limits  URINALYSIS, W/ REFLEX TO CULTURE (INFECTION SUSPECTED) - Abnormal; Notable for the following components:   Leukocytes,Ua SMALL (*)    Bacteria, UA RARE (*)    All other components within normal limits  URINE CULTURE  LIPASE, BLOOD  TROPONIN I (HIGH SENSITIVITY)  TROPONIN I (HIGH SENSITIVITY)    EKG EKG Interpretation Date/Time:  Friday April 02 2023 19:48:50 EDT Ventricular Rate:  85 PR Interval:  141 QRS Duration:  101 QT Interval:  367 QTC Calculation: 437 R Axis:   -38  Text Interpretation: Sinus rhythm Consider right atrial enlargement Left ventricular hypertrophy similar to earlier in the day Confirmed by Pricilla Loveless [phone]) on 04/02/2023 7:53:36 PM  Radiology DG Chest 2 View  Result Date: 04/02/2023 CLINICAL DATA:  History of breast cancer, presenting with cough. EXAM: CHEST - 2 VIEW COMPARISON:  December 01, 2018 FINDINGS: The cardiac silhouette is mildly enlarged and unchanged in size. Mildly decreased lung volumes are seen. Both lungs are clear. Multiple radiopaque surgical clips are seen overlying the left axilla. The visualized skeletal structures are unremarkable. IMPRESSION: Mild cardiomegaly without acute or  active cardiopulmonary disease. Electronically Signed   By: Aram Candela M.D.   On: 04/02/2023 19:37    Procedures Procedures    Medications Ordered in ED Medications  morphine (PF) 4 MG/ML injection 4 mg (4 mg Intravenous Given 04/02/23 2008)  amLODipine (NORVASC) tablet 10 mg (10 mg Oral Given 04/02/23 2242)  acetaminophen (TYLENOL) tablet 650 mg (650 mg Oral Given 04/02/23 2241)  iohexol (OMNIPAQUE) 350 MG/ML injection 100 mL (100 mLs Intravenous Contrast Given 04/02/23 2301)    ED Course/ Medical Decision Making/ A&P                                 Medical Decision Making Amount and/or Complexity of Data Reviewed Labs: ordered.    Details: COVID test is positive.  Troponins negative x 2.  She does have leukocytes in her urine but no urinary symptoms. Radiology: ordered.    Details: No CHF ECG/medicine tests: ordered and independent interpretation performed.    Details: No ischemia  Risk OTC drugs. Prescription drug management.   Patient presents with multiple areas of pain as well as uncontrolled hypertension.  However this seems to be due to poor compliance with her Norvasc.  She was given dose of her Norvasc as well as some pain medicine.  Her blood pressure is coming down a small amount and is now 183/88.  CT was obtained to help rule out dissection.  Otherwise, there is no evidence of MI.  Her urine is questionable with small leukocytes though this has reflexively  been sent to culture.  Given she has no UTI symptoms think we will wait and see if the culture is positive before deciding about antibiotics.  She is febrile but this seems to be due to COVID which seems to be symptomatic as she developed a cough last night.  However she is not hypoxic or in any distress.  She would like to start Paxlovid.  I discussed her CT results.  This is concerning for metastatic disease.  She will need to follow-up with her oncologist, Dr. Pamelia Hoit.  Otherwise, the CT does seem to show some possible rib fractures, which could be causing her pain and so I will give her a short course of hydrocodone.  Overall she appears stable for discharge with outpatient management and follow-up with PCP and oncology.  Will give return precautions.       Final Clinical Impression(s) / ED Diagnoses Final diagnoses:  None    Rx / DC Orders ED Discharge Orders     None         Pricilla Loveless, MD 04/02/23 2358

## 2023-04-02 NOTE — ED Triage Notes (Addendum)
Patient present due to left flank pain (had breast cancer on this side). The pain moved up into her left shoulder and upper back. She now reports upper back stiffness. Urgent care sent her here due to her blood pressure. Patient has not been compliant with taking all medications.

## 2023-04-02 NOTE — ED Notes (Signed)
Pt made aware of need for UA sample. 

## 2023-04-02 NOTE — ED Triage Notes (Signed)
"  This pain started in left lower side/back and moved to right shoulder and now upper-mid back". A few days ago "started spasms but not now". No injury known. No abd pain. No chest pain or sob.

## 2023-04-02 NOTE — Discharge Instructions (Addendum)
Please go to the nearest emergency department for further evaluation and management of your symptoms.

## 2023-04-03 ENCOUNTER — Ambulatory Visit: Payer: BC Managed Care – PPO

## 2023-04-04 LAB — URINE CULTURE

## 2023-04-09 ENCOUNTER — Encounter: Payer: Self-pay | Admitting: Family Medicine

## 2023-04-09 ENCOUNTER — Ambulatory Visit: Payer: BC Managed Care – PPO | Admitting: Family Medicine

## 2023-04-09 VITALS — BP 140/70 | HR 57 | Temp 98.0°F | Resp 18 | Ht 66.0 in | Wt 244.6 lb

## 2023-04-09 DIAGNOSIS — I10 Essential (primary) hypertension: Secondary | ICD-10-CM | POA: Diagnosis not present

## 2023-04-09 DIAGNOSIS — E039 Hypothyroidism, unspecified: Secondary | ICD-10-CM

## 2023-04-09 DIAGNOSIS — E785 Hyperlipidemia, unspecified: Secondary | ICD-10-CM

## 2023-04-09 DIAGNOSIS — S2249XA Multiple fractures of ribs, unspecified side, initial encounter for closed fracture: Secondary | ICD-10-CM

## 2023-04-09 DIAGNOSIS — M170 Bilateral primary osteoarthritis of knee: Secondary | ICD-10-CM | POA: Diagnosis not present

## 2023-04-09 MED ORDER — AMLODIPINE BESYLATE 5 MG PO TABS
5.0000 mg | ORAL_TABLET | Freq: Every day | ORAL | 1 refills | Status: DC
Start: 2023-04-09 — End: 2024-01-04

## 2023-04-09 MED ORDER — MELOXICAM 15 MG PO TABS
15.0000 mg | ORAL_TABLET | Freq: Every day | ORAL | 1 refills | Status: DC | PRN
Start: 2023-04-09 — End: 2023-10-12

## 2023-04-09 MED ORDER — SYNTHROID 100 MCG PO TABS
100.0000 ug | ORAL_TABLET | Freq: Every day | ORAL | 1 refills | Status: DC
Start: 2023-04-09 — End: 2024-01-04

## 2023-04-09 MED ORDER — CARVEDILOL 25 MG PO TABS
25.0000 mg | ORAL_TABLET | Freq: Two times a day (BID) | ORAL | 1 refills | Status: DC
Start: 2023-04-09 — End: 2024-01-04

## 2023-04-09 MED ORDER — HYDROCODONE-ACETAMINOPHEN 5-325 MG PO TABS: 1.0000 | ORAL_TABLET | ORAL | 0 refills | Status: DC | PRN

## 2023-04-09 NOTE — Assessment & Plan Note (Signed)
Check labs  Con't synthroid 

## 2023-04-09 NOTE — Assessment & Plan Note (Signed)
Encourage heart healthy diet such as MIND or DASH diet, increase exercise, avoid trans fats, simple carbohydrates and processed foods, consider a krill or fish or flaxseed oil cap daily.  °

## 2023-04-09 NOTE — Assessment & Plan Note (Signed)
Well controlled, no changes to meds. Encouraged heart healthy diet such as the DASH diet and exercise as tolerated.  °

## 2023-04-09 NOTE — Progress Notes (Signed)
Patient Care Team: Zola Button, Grayling Congress, DO as PCP - General Jodi Geralds, MD as Consulting Physician (Orthopedic Surgery) Harriette Bouillon, MD as Consulting Physician (General Surgery) Serena Croissant, MD as Consulting Physician (Hematology and Oncology) Dorothy Puffer, MD as Consulting Physician (Radiation Oncology) Jethro Bolus, MD as Consulting Physician (Ophthalmology)  DIAGNOSIS: No diagnosis found.  SUMMARY OF ONCOLOGIC HISTORY: Oncology History  Malignant neoplasm of upper-outer quadrant of left breast in female, estrogen receptor positive (HCC)  08/24/2018 Initial Biopsy   Screening detected left breast mass at 2:30 position 6 mm in size with one abnormal lymph node and stable benign calcifications, biopsy of the mass grade 2 IDC with DCIS and lymphovascular invasion, lymph node biopsy is positive, ER 95%, PR 40%, Ki-67 20%, HER-2 -1+ by IHC, T1b N1 stage Ib   08/31/2018 Cancer Staging   Staging form: Breast, AJCC 8th Edition - Clinical: Stage IB (cT1b, cN1(f), cM0, G2, ER+, PR+, HER2-) - Signed by Serena Croissant, MD on 08/31/2018   09/21/2018 Genetic Testing   Genetic testing performed through [facility]'s Common Hereditary Cancers Panel reported out on 09/19/2018 showed no pathogenic mutations.  The Common Hereditary Cancer Panel offered by [facility] includes sequencing and/or deletion duplication testing of the following 53 genes: APC, ATM, AXIN2, BARD1, BMPR1A, BRCA1, BRCA2, BRIP1, BUB1B, CDH1, CDK4, CDKN2A, CHEK2, CTNNA1, DICER1, ENG, EPCAM, GALNT12, GREM1, HOXB13, KIT, MEN1, MLH1, MLH3, MSH2, MSH3, MSH6, MUTYH, NBN, NF1, NTHL1, PALB2, PDGFRA, PMS2, POLD1, POLE, PTEN, RAD50, RAD51C, RAD51D, RNF43, RPS20, SDHA, SDHB, SDHC, SDHD, SMAD4, SMARCA4, STK11, TP53, TSC1, TSC2, VHL.   A variant of uncertain significance (VUS) in a gene called NBN was also noted. c.1979G>C (p.Arg660Thr)   10/18/2018 Cancer Staging   Staging form: Breast, AJCC 8th Edition - Pathologic stage from 10/18/2018: Stage IB  (pT1b, pN2a, cM0, G2, ER+, PR+, HER2-) - Signed by Serena Croissant, MD on 11/14/2018   11/09/2018 Surgery   Left lumpectomy ([facility]) [phone]): Grade 2 IDC with DCIS, 1 cm, margins negative, DCIS focally 0.1 cm from superior margin, 2/2 lymph nodes positive, ER 95%, PR 45%, HER-2 negative, Ki-67 20% AXLND: 3/20 LN Pos (total 5/22)   12/15/2018 -  Chemotherapy   DOXOrubicin (ADRIAMYCIN) chemo injection 142 mg, 60 mg/m2 = 142 mg, Intravenous,  Once, 4 of 4 cycles. Administration: 142 mg (12/15/2018), 142 mg (12/29/2018), 142 mg (01/12/2019), 142 mg (01/26/2019)  palonosetron (ALOXI) injection 0.25 mg, 0.25 mg, Intravenous,  Once, 4 of 4 cycles. Administration: 0.25 mg (12/15/2018), 0.25 mg (12/29/2018), 0.25 mg (01/12/2019), 0.25 mg (01/26/2019)  pegfilgrastim-cbqv (UDENYCA) injection 6 mg, 6 mg, Subcutaneous, Once, 4 of 4 cycles. Administration: 6 mg (12/17/2018), 6 mg (12/31/2018), 6 mg (01/14/2019), 6 mg (01/28/2019)  cyclophosphamide (CYTOXAN) 1,420 mg in sodium chloride 0.9 % 250 mL chemo infusion, 600 mg/m2 = 1,420 mg, Intravenous,  Once, 4 of 4 cycles. Administration: 1,420 mg (12/15/2018), 1,420 mg (12/29/2018), 1,420 mg (01/12/2019), 1,420 mg (01/26/2019)  PACLitaxel (TAXOL) 192 mg in sodium chloride 0.9 % 250 mL chemo infusion (</= 80mg /m2), 80 mg/m2 = 192 mg, Intravenous,  Once, 11 of 12 cycles. Dose modification: 60 mg/m2 (original dose 80 mg/m2, Cycle 12, Reason: Dose not tolerated). Administration: 192 mg (02/09/2019), 192 mg (02/16/2019), 192 mg (02/23/2019), 192 mg (03/02/2019), 192 mg (03/09/2019), 192 mg (03/16/2019), 192 mg (03/23/2019), 144 mg (03/30/2019), 144 mg (04/06/2019), 144 mg (04/14/2019), 144 mg (04/20/2019)  fosaprepitant (EMEND) 150 mg  dexamethasone (DECADRON) 12 mg in sodium chloride 0.9 % 145 mL IVPB, , Intravenous,  Once, 4 of 4 cycles  Administration:  (12/15/2018),  (12/29/2018),  (01/12/2019),  (01/26/2019)     06/01/2019 - 07/21/2019 Radiation Therapy   The patient initially received a dose  of 50.4 Gy in 28 fractions to the breast using whole-breast tangent fields. This was delivered using a 3-D conformal technique. The pt received a boost delivering an additional 10 Gy in 5 fractions using a electron boost with electrons. The total dose was 60.4 Gy.    08/2019 - 08/2024 Anti-estrogen oral therapy   Anastrozole     CHIEF COMPLIANT:   INTERVAL HISTORY: Kayla Banks is a   ALLERGIES:  is allergic to erythromycin, lisinopril, penicillins, and adhesive [tape].  MEDICATIONS:  Current Outpatient Medications  Medication Sig Dispense Refill   amLODipine (NORVASC) 5 MG tablet Take 1 tablet (5 mg total) by mouth daily. Needs ov before any more refills 90 tablet 1   anastrozole (ARIMIDEX) 1 MG tablet TAKE 1 TABLET(1 MG) BY MOUTH DAILY 90 tablet 3   carvedilol (COREG) 25 MG tablet Take 1 tablet (25 mg total) by mouth 2 (two) times daily with a meal. Due for appt 03/2023 180 tablet 1   EPINEPHRINE 0.3 mg/0.3 mL IJ SOAJ injection INJECT 0.3 ML(1 SYRINGE) IN THE MUSCLE AS NEEDED FOR ALLERGIC REACTION 2 Device 0   gabapentin (NEURONTIN) 300 MG capsule Take 1 capsule (300 mg total) by mouth 2 (two) times daily. 180 capsule 1   HYDROcodone-acetaminophen (NORCO) 5-325 MG tablet Take 1 tablet by mouth every 4 (four) hours as needed. 30 tablet 0   LORazepam (ATIVAN) 0.5 MG tablet TAKE 1 TABLET(0.5 MG) BY MOUTH AT BEDTIME AS NEEDED FOR SLEEP 30 tablet 1   meloxicam (MOBIC) 15 MG tablet Take 1 tablet (15 mg total) by mouth daily as needed for pain. 90 tablet 1   SYNTHROID 100 MCG tablet Take 1 tablet (100 mcg total) by mouth daily before breakfast. 90 tablet 1   valACYclovir (VALTREX) 1000 MG tablet TAKE 1 TABLET BY MOUTH THREE TIMES DAILY AS NEEDED 30 tablet 2   No current facility-administered medications for this visit.    PHYSICAL EXAMINATION: ECOG PERFORMANCE STATUS: {CHL ONC ECOG PS:[id number]}  There were no vitals filed for this visit. There were no vitals filed for this  visit.  BREAST:*** No palpable masses or nodules in either right or left breasts. No palpable axillary supraclavicular or infraclavicular adenopathy no breast tenderness or nipple discharge. (exam performed in the presence of a chaperone)  LABORATORY DATA:  I have reviewed the data as listed    Latest Ref Rng & Units 04/02/2023    8:06 PM 02/03/2022   12:03 PM 07/07/2021   11:39 AM  CMP  Glucose 70 - 99 mg/dL 960  91  92   BUN 8 - 23 mg/dL 17  13  11    Creatinine 0.44 - 1.00 mg/dL 4.54  0.98  1.19   Sodium 135 - 145 mmol/L 140  142  141   Potassium 3.5 - 5.1 mmol/L 3.5  4.5  4.6   Chloride 98 - 111 mmol/L 101  104  103   CO2 22 - 32 mmol/L 27  30  31    Calcium 8.9 - 10.3 mg/dL 14.7  9.3  9.4   Total Protein 6.5 - 8.1 g/dL 8.7  6.9  6.8   Total Bilirubin 0.3 - 1.2 mg/dL 1.0  0.9  0.8   Alkaline Phos 38 - 126 U/L 122  91  89   AST 15 - 41 U/L 19  12  14   ALT 0 - 44 U/L 15  11  11      Lab Results  Component Value Date   WBC 11.1 (H) 04/02/2023   HGB 14.5 04/02/2023   HCT 45.8 04/02/2023   MCV 85.4 04/02/2023   PLT 259 04/02/2023   NEUTROABS 9.6 (H) 04/02/2023    ASSESSMENT & PLAN:  No problem-specific Assessment & Plan notes found for this encounter.    No orders of the defined types were placed in this encounter.  The patient has a good understanding of the overall plan. she agrees with it. she will call with any problems that may develop before the next visit here. Total time spent: 30 mins including face to face time and time spent for planning, charting and co-ordination of care   Sherlyn Lick, CMA 04/09/23    I Janan Ridge am acting as a Neurosurgeon for [employer]  ***

## 2023-04-09 NOTE — Progress Notes (Signed)
Established Patient Office Visit  Subjective   Patient ID: Kayla Banks, female    DOB: [birth year]  Age: 67 y.o. MRN: [id number]  Chief Complaint  Patient presents with   Hypertension   Hyperlipidemia   Hypothyroidism   Follow-up    HPI Discussed the use of AI scribe software for clinical note transcription with the patient, who gave verbal consent to proceed.  History of Present Illness   The patient, with a history of hypertension, hyperlipidemia, neuropathy, arthritis, hypothyroidism, and herpes simplex, presents for medication refills. She reports that she is not out of medications, but is nearing the end of their current supply. She requests refills for amlodipine, carvedilol, meloxicam, and Synthroid. She has sufficient gabapentin and acyclovir, the latter of which she takes as needed for cold sores.  Additionally, the patient reports a recent diagnosis of COVID-19 and associated back and side pain. She was evaluated in the emergency department where a CT scan revealed acute left seventh, eighth, and ninth rib fractures. The patient is unsure of the cause of the fractures, but recalls a fall last year. The pain is new, having started within the last week. The patient also reports that the CT scan showed a couple of spots on the lungs, and she is scheduled to follow up with her pulmonologist.      Patient Active Problem List   Diagnosis Date Noted   Chemotherapy-induced peripheral neuropathy (HCC) 03/30/2019   Port-A-Cath in place 12/15/2018   Genetic testing 09/21/2018   Family history of breast cancer    Malignant neoplasm of upper-outer quadrant of left breast in female, estrogen receptor positive (HCC) 08/29/2018   Hyperlipidemia 08/04/2018   Obesity (BMI 30-39.9) 05/09/2013   Preventative health care 11/19/2010   URI (upper respiratory infection) 11/19/2010   KNEE PAIN, BILATERAL 09/25/2010   SINUSITIS - ACUTE-NOS 12/24/2009   MORBID OBESITY 06/14/2009   COLD SORE  10/16/2008   SKIN TAG 06/18/2008   Hypothyroidism 03/17/2007   Essential hypertension 03/17/2007   Past Medical History:  Diagnosis Date   Breast cancer (HCC)    Complication of anesthesia    Family history of breast cancer    Heart murmur    no problems, saw cardiologist 2010   Hypertension    on meds   Hypothyroidism    Osteoarthritis    Personal history of chemotherapy    Personal history of radiation therapy    left   Plantar fasciitis    PONV (postoperative nausea and vomiting)    Thyroid disease    Hypothyroidism   Past Surgical History:  Procedure Laterality Date   ABDOMINAL HYSTERECTOMY  02/2012   AXILLARY LYMPH NODE DISSECTION Left 11/09/2018   Procedure: LEFT AXILLARY LYMPH NODE DISSECTION;  Surgeon: Harriette Bouillon, MD;  Location: [facility];  Service: General;  Laterality: Left;   BREAST BIOPSY Left    malignant   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/11/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   BREAST LUMPECTOMY WITH RADIOACTIVE SEED AND SENTINEL LYMPH NODE BIOPSY Left 10/11/2018   Procedure: LEFT BREAST LUMPECTOMY WITH RADIOACTIVE SEED AND LEFT SENTINEL LYMPH NODE MAPPING WITH LEFT TARGETED LYMPH NODE BIOPSY;  Surgeon: Harriette Bouillon, MD;  Location: [facility];  Service: General;  Laterality: Left;   CATARACT EXTRACTION Bilateral 11/3  06/26/20   shipiro    FOOT SURGERY Left    bone spurs   LUMBAR LAMINECTOMY  1990   PORT-A-CATH REMOVAL N/A 05/23/2019   Procedure: PORT REMOVAL;  Surgeon: Harriette Bouillon, MD;  Location: [facility];  Service: General;  Laterality: N/A;   PORTACATH PLACEMENT Right 12/01/2018   Procedure: INSERTION PORT-A-CATH WITH ULTRASOUND;  Surgeon: Harriette Bouillon, MD;  Location: [facility];  Service: General;  Laterality: Right;   Social History   Tobacco Use   Smoking status: Never   Smokeless tobacco: Never  Vaping Use   Vaping status: Never Used  Substance Use Topics   Alcohol  use: Not Currently    Comment: occassionally   Drug use: Never   Social History   Socioeconomic History   Marital status: Single    Spouse name: Not on file   Number of children: Not on file   Years of education: Not on file   Highest education level: Master's degree (e.g., MA, MS, MEng, MEd, MSW, MBA)  Occupational History   Occupation: [employer]  Tobacco Use   Smoking status: Never   Smokeless tobacco: Never  Vaping Use   Vaping status: Never Used  Substance and Sexual Activity   Alcohol use: Not Currently    Comment: occassionally   Drug use: Never   Sexual activity: Not Currently    Partners: Male  Other Topics Concern   Not on file  Social History Narrative   Exercise-- no   Social Determinants of Health   Financial Resource Strain: Low Risk  (04/02/2023)   Overall Financial Resource Strain (CARDIA)    Difficulty of Paying Living Expenses: Not very hard  Food Insecurity: No Food Insecurity (04/02/2023)   Hunger Vital Sign    Worried About Running Out of Food in the Last Year: Never true    Ran Out of Food in the Last Year: Never true  Transportation Needs: No Transportation Needs (04/02/2023)   PRAPARE - Administrator, Civil Service (Medical): No    Lack of Transportation (Non-Medical): No  Physical Activity: Unknown (04/02/2023)   Exercise Vital Sign    Days of Exercise per Week: 0 days    Minutes of Exercise per Session: Not on file  Stress: Stress Concern Present (04/02/2023)   [employer] of Occupational Health - Occupational Stress Questionnaire    Feeling of Stress : To some extent  Social Connections: Moderately Integrated (04/02/2023)   Social Connection and Isolation Panel [NHANES]    Frequency of Communication with Friends and Family: Once a week    Frequency of Social Gatherings with Friends and Family: More than three times a week    Attends Religious Services: More than 4 times per year    Active Member of [employer] or  Organizations: Yes    Attends Engineer, structural: More than 4 times per year    Marital Status: Never married  Catering manager Violence: Not on file   Family Status  Relation Name Status   Mother  Alive   Father  Deceased   Sister  Alive   Sister  Alive   Mat Aunt  Deceased   Other MGaung Alive  No partnership data on file   Family History  Problem Relation Age of Onset   Diabetes Mother    Hyperlipidemia Mother    Hypertension Mother    Hypertension Father    Heart disease Father 90       ?MI   Hypothyroidism Sister    Hyperthyroidism Sister    Breast cancer Maternal Aunt        dx 60's/70's   Breast cancer  Other        dx early 61's   Allergies  Allergen Reactions   Erythromycin Swelling    Lip swelling; angioedema   Lisinopril Swelling    REACTION: angioedema   Penicillins Hives and Swelling    Swelling in arms & hands   Adhesive [Tape] Rash      Review of Systems  Constitutional:  Negative for chills, fever and malaise/fatigue.  HENT:  Negative for congestion and hearing loss.   Eyes:  Negative for blurred vision and discharge.  Respiratory:  Negative for cough, sputum production and shortness of breath.   Cardiovascular:  Negative for chest pain, palpitations and leg swelling.  Gastrointestinal:  Negative for abdominal pain, blood in stool, constipation, diarrhea, heartburn, nausea and vomiting.  Genitourinary:  Negative for dysuria, frequency, hematuria and urgency.  Musculoskeletal:  Negative for back pain, falls and myalgias.  Skin:  Negative for rash.  Neurological:  Negative for dizziness, sensory change, loss of consciousness, weakness and headaches.  Endo/Heme/Allergies:  Negative for environmental allergies. Does not bruise/bleed easily.  Psychiatric/Behavioral:  Negative for depression and suicidal ideas. The patient is not nervous/anxious and does not have insomnia.       Objective:     BP (!) 140/70 (BP Location: Right Arm,  Patient Position: Sitting, Cuff Size: Large)   Pulse (!) 57   Temp 98 F (36.7 C) (Oral)   Resp 18   Ht 5\' 6"  (1.676 m)   Wt 244 lb 9.6 oz (110.9 kg)   LMP 01/28/2012   SpO2 98%   BMI 39.48 kg/m  BP Readings from Last 3 Encounters:  04/09/23 (!) 140/70  04/03/23 129/66  04/02/23 (!) 193/84   Wt Readings from Last 3 Encounters:  04/09/23 244 lb 9.6 oz (110.9 kg)  04/02/23 270 lb (122.5 kg)  05/07/22 264 lb 8 oz (120 kg)   SpO2 Readings from Last 3 Encounters:  04/09/23 98%  04/03/23 97%  04/02/23 98%      Physical Exam Vitals and nursing note reviewed.  Constitutional:      General: She is not in acute distress.    Appearance: Normal appearance. She is well-developed.  HENT:     Head: Normocephalic and atraumatic.     Right Ear: Tympanic membrane, ear canal and external ear normal. There is no impacted cerumen.     Left Ear: Tympanic membrane, ear canal and external ear normal. There is no impacted cerumen.     Nose: Nose normal.     Mouth/Throat:     Mouth: Mucous membranes are moist.     Pharynx: Oropharynx is clear. No oropharyngeal exudate or posterior oropharyngeal erythema.  Eyes:     General: No scleral icterus.       Right eye: No discharge.        Left eye: No discharge.     Conjunctiva/sclera: Conjunctivae normal.     Pupils: Pupils are equal, round, and reactive to light.  Neck:     Thyroid: No thyromegaly or thyroid tenderness.     Vascular: No JVD.  Cardiovascular:     Rate and Rhythm: Normal rate and regular rhythm.     Heart sounds: Normal heart sounds. No murmur heard. Pulmonary:     Effort: Pulmonary effort is normal. No respiratory distress.     Breath sounds: Normal breath sounds.  Abdominal:     General: Bowel sounds are normal. There is no distension.     Palpations: Abdomen is soft. There is no  mass.     Tenderness: There is no abdominal tenderness. There is no guarding or rebound.  Genitourinary:    Vagina: Normal.  Musculoskeletal:         General: Normal range of motion.     Cervical back: Normal range of motion and neck supple.     Right lower leg: No edema.     Left lower leg: No edema.  Lymphadenopathy:     Cervical: No cervical adenopathy.  Skin:    General: Skin is warm and dry.     Findings: No erythema or rash.  Neurological:     Mental Status: She is alert and oriented to person, place, and time.     Cranial Nerves: No cranial nerve deficit.     Deep Tendon Reflexes: Reflexes are normal and symmetric.  Psychiatric:        Mood and Affect: Mood normal.        Behavior: Behavior normal.        Thought Content: Thought content normal.        Judgment: Judgment normal.      No results found for any visits on 04/09/23.  Last CBC Lab Results  Component Value Date   WBC 11.1 (H) 04/02/2023   HGB 14.5 04/02/2023   HCT 45.8 04/02/2023   MCV 85.4 04/02/2023   MCH 27.1 04/02/2023   RDW 14.3 04/02/2023   PLT 259 04/02/2023   Last metabolic panel Lab Results  Component Value Date   GLUCOSE 130 (H) 04/02/2023   NA 140 04/02/2023   K 3.5 04/02/2023   CL 101 04/02/2023   CO2 27 04/02/2023   BUN 17 04/02/2023   CREATININE 0.54 04/02/2023   GFRNONAA >60 04/02/2023   CALCIUM 10.3 04/02/2023   PROT 8.7 (H) 04/02/2023   ALBUMIN 4.1 04/02/2023   BILITOT 1.0 04/02/2023   ALKPHOS 122 04/02/2023   AST 19 04/02/2023   ALT 15 04/02/2023   ANIONGAP 12 04/02/2023   Last lipids Lab Results  Component Value Date   CHOL 177 02/03/2022   HDL 65.80 02/03/2022   LDLCALC 102 (H) 02/03/2022   TRIG 47.0 02/03/2022   CHOLHDL 3 02/03/2022   Last hemoglobin A1c Lab Results  Component Value Date   HGBA1C 6.1 02/03/2022   Last thyroid functions Lab Results  Component Value Date   TSH 1.68 02/03/2022   Last vitamin D No results found for: "25OHVITD2", "25OHVITD3", "VD25OH" Last vitamin B12 and Folate No results found for: "VITAMINB12", "FOLATE"    The 10-year ASCVD risk score (Arnett DK, et al.,  2019) is: 9.7%    Assessment & Plan:   Problem List Items Addressed This Visit       Unprioritized   Hypothyroidism - Primary    Check labs Con't synthroid       Relevant Medications   carvedilol (COREG) 25 MG tablet   SYNTHROID 100 MCG tablet   Other Relevant Orders   TSH   Hyperlipidemia    Encourage heart healthy diet such as MIND or DASH diet, increase exercise, avoid trans fats, simple carbohydrates and processed foods, consider a krill or fish or flaxseed oil cap daily.        Relevant Medications   amLODipine (NORVASC) 5 MG tablet   carvedilol (COREG) 25 MG tablet   Other Relevant Orders   CBC with Differential/Platelet   Comprehensive metabolic panel   Lipid panel   Essential hypertension    Well controlled, no changes to meds. Encouraged heart healthy  diet such as the DASH diet and exercise as tolerated.        Relevant Medications   amLODipine (NORVASC) 5 MG tablet   carvedilol (COREG) 25 MG tablet   Other Relevant Orders   CBC with Differential/Platelet   Comprehensive metabolic panel   Lipid panel   Other Visit Diagnoses     Primary osteoarthritis of both knees       Relevant Medications   meloxicam (MOBIC) 15 MG tablet   HYDROcodone-acetaminophen (NORCO) 5-325 MG tablet   Closed fracture of multiple ribs, unspecified laterality, initial encounter       Relevant Medications   HYDROcodone-acetaminophen (NORCO) 5-325 MG tablet     Assessment and Plan    COVID-19 Recent diagnosis with ongoing symptoms of congestion and cough. -Continue supportive care.  Acute Rib Fractures Recent onset of back and side pain. CT scan revealed acute left seventh, eighth, and ninth rib fractures. -Refilled pain medication (30 pills).  Lung Nodules CT scan revealed lung nodules. Patient scheduled to meet with Dr. Ave Filter for further evaluation. -Await consultation note from Dr. Ave Filter.  Medication Refills Patient requires refills for multiple chronic  medications. -Refill Amlodipine, Carvedilol, Meloxicam, and Synthroid.  Follow-up -Complete blood work today.        No follow-ups on file.    Donato Schultz, DO

## 2023-04-10 LAB — CBC WITH DIFFERENTIAL/PLATELET
Absolute Monocytes: 529 {cells}/uL (ref 200–950)
Basophils Absolute: 19 {cells}/uL (ref 0–200)
Basophils Relative: 0.3 %
Eosinophils Absolute: 50 {cells}/uL (ref 15–500)
Eosinophils Relative: 0.8 %
HCT: 44.4 % (ref 35.0–45.0)
Hemoglobin: 14.2 g/dL (ref 11.7–15.5)
Lymphs Abs: 1625 {cells}/uL (ref 850–3900)
MCH: 26.8 pg — ABNORMAL LOW (ref 27.0–33.0)
MCHC: 32 g/dL (ref 32.0–36.0)
MCV: 83.9 fL (ref 80.0–100.0)
MPV: 12 fL (ref 7.5–12.5)
Monocytes Relative: 8.4 %
Neutro Abs: 4076 {cells}/uL (ref 1500–7800)
Neutrophils Relative %: 64.7 %
Platelets: 223 10*3/uL (ref 140–400)
RBC: 5.29 10*6/uL — ABNORMAL HIGH (ref 3.80–5.10)
RDW: 13.8 % (ref 11.0–15.0)
Total Lymphocyte: 25.8 %
WBC: 6.3 10*3/uL (ref 3.8–10.8)

## 2023-04-10 LAB — COMPREHENSIVE METABOLIC PANEL
AG Ratio: 1.2 (calc) (ref 1.0–2.5)
ALT: 19 U/L (ref 6–29)
AST: 22 U/L (ref 10–35)
Albumin: 4 g/dL (ref 3.6–5.1)
Alkaline phosphatase (APISO): 117 U/L (ref 37–153)
BUN: 22 mg/dL (ref 7–25)
CO2: 30 mmol/L (ref 20–32)
Calcium: 10.3 mg/dL (ref 8.6–10.4)
Chloride: 102 mmol/L (ref 98–110)
Creat: 0.94 mg/dL (ref 0.50–1.05)
Globulin: 3.3 g/dL (ref 1.9–3.7)
Glucose, Bld: 94 mg/dL (ref 65–99)
Potassium: 4.3 mmol/L (ref 3.5–5.3)
Sodium: 143 mmol/L (ref 135–146)
Total Bilirubin: 0.7 mg/dL (ref 0.2–1.2)
Total Protein: 7.3 g/dL (ref 6.1–8.1)

## 2023-04-10 LAB — LIPID PANEL
Cholesterol: 173 mg/dL (ref <200)
HDL: 55 mg/dL (ref >=50)
LDL Cholesterol (Calc): 99 mg/dL
Non-HDL Cholesterol (Calc): 118 mg/dL (ref <130)
Total CHOL/HDL Ratio: 3.1 (calc) (ref <5.0)
Triglycerides: 93 mg/dL (ref <150)

## 2023-04-10 LAB — TSH: TSH: 3.75 m[IU]/L (ref 0.40–4.50)

## 2023-04-12 ENCOUNTER — Telehealth: Payer: Self-pay | Admitting: Hematology and Oncology

## 2023-04-12 ENCOUNTER — Inpatient Hospital Stay: Payer: BC Managed Care – PPO | Attending: Hematology and Oncology | Admitting: Hematology and Oncology

## 2023-04-12 DIAGNOSIS — C50412 Malignant neoplasm of upper-outer quadrant of left female breast: Secondary | ICD-10-CM | POA: Insufficient documentation

## 2023-04-12 DIAGNOSIS — G620 Drug-induced polyneuropathy: Secondary | ICD-10-CM | POA: Insufficient documentation

## 2023-04-12 DIAGNOSIS — Z170 Estrogen receptor positive status [ER+]: Secondary | ICD-10-CM | POA: Diagnosis not present

## 2023-04-12 DIAGNOSIS — Z9221 Personal history of antineoplastic chemotherapy: Secondary | ICD-10-CM | POA: Insufficient documentation

## 2023-04-12 DIAGNOSIS — Z79811 Long term (current) use of aromatase inhibitors: Secondary | ICD-10-CM | POA: Insufficient documentation

## 2023-04-12 DIAGNOSIS — Z923 Personal history of irradiation: Secondary | ICD-10-CM | POA: Diagnosis not present

## 2023-04-12 DIAGNOSIS — J90 Pleural effusion, not elsewhere classified: Secondary | ICD-10-CM | POA: Insufficient documentation

## 2023-04-12 DIAGNOSIS — T451X5D Adverse effect of antineoplastic and immunosuppressive drugs, subsequent encounter: Secondary | ICD-10-CM | POA: Diagnosis not present

## 2023-04-12 DIAGNOSIS — R918 Other nonspecific abnormal finding of lung field: Secondary | ICD-10-CM | POA: Insufficient documentation

## 2023-04-12 DIAGNOSIS — C7951 Secondary malignant neoplasm of bone: Secondary | ICD-10-CM | POA: Diagnosis not present

## 2023-04-12 NOTE — Telephone Encounter (Signed)
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 Per Dr.Gudena, OK to change visit to telephone visit. Called and notified pt. Pt verbalized understanding.

## 2023-04-12 NOTE — Assessment & Plan Note (Signed)
Left lumpectomy: Grade 2 IDC with DCIS, 1 cm, margins negative, DCIS focally 0.1 cm from superior margin, 2/2 lymph nodes positive, ER 95%, PR 45%, HER-2 negative, Ki-67 20% AXLND: 3/20 LN Pos (total 5/22)   Treatment Plan: 1. Adj chemo with DD Adriamycin and Cytoxan followed by Taxol weekly X 11 (stopped for neuropathy) completed 04/19/2019 2. Adj XRT 06/01/2019-07/21/2019 3. Adj Anti estrogen therapy --------------------------------------------------------------------------------------------- Current treatment: Anastrozole 1 mg daily started 07/26/2019 Anastrozole toxicities: Occasional hot flashes but otherwise tolerating it fairly well. Trigger finger most likely caused by anastrozole.  She is seeing orthopedics.   Breast cancer surveillance: Mammogram: 06/11/2020: Benign, breast density category B. Bone density 05/05/2022: T score of 0.3: Normal  CT CAP 04/02/2023: Small bilateral pleural effusions, multiple small bilateral pulmonary nodules measuring up to 5 mm, right paratracheal and right hilar lymph nodes indeterminate.  Numerous lytic lesions in axial and appendical skeleton concerning for metastatic disease or myeloma.  Possible acute left seventh eighth ninth rib fractures hypodensities within the liver subcentimeter  Treatment plan: Pulmonary consultation for biopsy Recommended adding Verzinio to her treatment plan since her symptoms and suggest metastatic breast cancer..   Peripheral neuropathy: Chemotherapy-induced.  She was offered participation in the neuropathy clinical trial but she decided not to participate in it.   Return to clinic in 1 year for follow-up

## 2023-04-14 ENCOUNTER — Telehealth: Payer: Self-pay | Admitting: Hematology and Oncology

## 2023-04-14 NOTE — Telephone Encounter (Signed)
Rescheduled appointment room/resource. Patient is aware of the changes made to her upcoming appointment.

## 2023-04-16 ENCOUNTER — Ambulatory Visit: Payer: BC Managed Care – PPO | Admitting: Family Medicine

## 2023-04-26 ENCOUNTER — Ambulatory Visit (HOSPITAL_COMMUNITY)
Admission: RE | Admit: 2023-04-26 | Discharge: 2023-04-26 | Disposition: A | Discharge status: Home / Self Care | Payer: BC Managed Care – PPO | Source: Ambulatory Visit | Attending: Hematology and Oncology | Admitting: Hematology and Oncology

## 2023-04-26 DIAGNOSIS — C7951 Secondary malignant neoplasm of bone: Secondary | ICD-10-CM

## 2023-04-26 LAB — GLUCOSE, CAPILLARY: Glucose-Capillary: 94 mg/dL (ref 70–99)

## 2023-04-26 MED ORDER — FLUDEOXYGLUCOSE F - 18 (FDG) INJECTION
12.1800 | Freq: Once | INTRAVENOUS | Status: AC
  Administered 2023-04-26: 12.18 via INTRAVENOUS

## 2023-04-30 ENCOUNTER — Telehealth: Payer: Self-pay | Admitting: Hematology and Oncology

## 2023-04-30 ENCOUNTER — Other Ambulatory Visit: Payer: Self-pay | Admitting: Hematology and Oncology

## 2023-04-30 DIAGNOSIS — S2249XA Multiple fractures of ribs, unspecified side, initial encounter for closed fracture: Secondary | ICD-10-CM

## 2023-04-30 MED ORDER — HYDROCODONE-ACETAMINOPHEN 5-325 MG PO TABS
1.0000 | ORAL_TABLET | ORAL | 0 refills | Status: DC | PRN
Start: 2023-04-30 — End: 2023-07-13

## 2023-04-30 NOTE — Telephone Encounter (Signed)
I discussed with the patient about the PET CT scan finding. I would like to consult pulmonary for lymph node biopsy. Will request radiation oncology consultation for palliative radiation. Once we have the pathology we can discuss change in treatment plan.

## 2023-05-03 NOTE — Progress Notes (Signed)
Histology and Location of Primary Cancer: Left Breast cancer metastatic to bone  Sites of Visceral and Bony Metastatic Disease: Right and Left sides, upper right back at shoulder, across lower back, right side worse.    PET 04/26/2023: Widespread hypermetabolic bone metastases involving cervical, thoracic, and lumbar spine as well as the sternum, ribs, bony anatomy of the shoulders, and bony pelvis. Hypermetabolic right paratracheal and right hilar lymphadenopathy consistent with metastatic disease.  Tiny bilateral pulmonary nodules are below size threshold for resolution on PET imaging but raise concern for metastatic disease.  No evidence for hypermetabolic metastatic disease in the soft tissue organ anatomy of the abdomen/pelvis.   Past/Anticipated chemotherapy by medical oncology, if any:  Dr. Pamelia Hoit 05/04/2023 Treatment plan: PET/CT scan 04/30/2023: Widespread bone metastases cervical thoracic lumbar spine sternum ribs shoulders and pelvis, hypermetabolic right paratracheal and right hilar lymphadenopathy, tiny pulmonary nodules: Patient has appointment to see Dr. Tonia Brooms for bronchoscopy and biopsy. Because of the delay in getting bronch and biopsy, we decided to add Verzinio and switch anastrozole to Letrozole Patient seeing Dr. Mitzi Hansen for palliative radiation. 04/30/2023-I would like to consult pulmonary for lymph node biopsy.  Will request radiation oncology consultation for palliative radiation.  Once we have the pathology we can discuss change in treatment plan. Treatment plan: PET/CT scan and based on the result we will try to obtain a biopsy The final pathology is estrogen receptor positive, I recommend adding Verzinio -Treatment Plan: 1. Adj chemo with DD Adriamycin and Cytoxan followed by Taxol weekly X 11 (stopped for neuropathy) completed 04/19/2019 2. Adj XRT 06/01/2019-07/21/2019 3. Adj Anti estrogen therapy   Pain on a scale of 0-10 is: Pain in both sides at her rib cage.  She  states right side is a little worse.    If Spine Met(s), symptoms, if any, include: Bowel/Bladder retention or incontinence (please describe): She reports some constipation in the last week.  Denies incontinence. Numbness or weakness in extremities (please describe): She reports continues neuropathy in her hands and feet.  Current Decadron regimen, if applicable:   Ambulatory status? Walker? Wheelchair?: Ambulatory  SAFETY ISSUES: Prior radiation? Left Breast 10/15-12/11/2018, with Dr. Mitzi Hansen.  Total dose 60.4 Gy. Pacemaker/ICD? No Possible current pregnancy? Hysterectomy Is the patient on methotrexate? No  Current Complaints / other details:

## 2023-05-04 ENCOUNTER — Telehealth: Payer: Self-pay

## 2023-05-04 ENCOUNTER — Other Ambulatory Visit: Payer: Self-pay

## 2023-05-04 ENCOUNTER — Other Ambulatory Visit (HOSPITAL_COMMUNITY): Payer: Self-pay

## 2023-05-04 ENCOUNTER — Telehealth: Payer: Self-pay | Admitting: Pharmacy Technician

## 2023-05-04 ENCOUNTER — Encounter: Payer: Self-pay | Admitting: Hematology and Oncology

## 2023-05-04 ENCOUNTER — Inpatient Hospital Stay: Payer: BC Managed Care – PPO | Admitting: Hematology and Oncology

## 2023-05-04 DIAGNOSIS — C50412 Malignant neoplasm of upper-outer quadrant of left female breast: Secondary | ICD-10-CM

## 2023-05-04 DIAGNOSIS — Z79811 Long term (current) use of aromatase inhibitors: Secondary | ICD-10-CM | POA: Insufficient documentation

## 2023-05-04 DIAGNOSIS — Z79899 Other long term (current) drug therapy: Secondary | ICD-10-CM | POA: Insufficient documentation

## 2023-05-04 DIAGNOSIS — Z170 Estrogen receptor positive status [ER+]: Secondary | ICD-10-CM

## 2023-05-04 DIAGNOSIS — C7951 Secondary malignant neoplasm of bone: Secondary | ICD-10-CM | POA: Insufficient documentation

## 2023-05-04 DIAGNOSIS — Z510 Encounter for antineoplastic radiation therapy: Secondary | ICD-10-CM | POA: Diagnosis present

## 2023-05-04 MED ORDER — ABEMACICLIB 100 MG PO TABS
100.0000 mg | ORAL_TABLET | Freq: Two times a day (BID) | ORAL | 3 refills | Status: DC
  Filled 2023-05-04: qty 70, 35d supply, fill #0
  Filled 2023-05-07: qty 56, 28d supply, fill #0
  Filled 2023-05-28: qty 56, 28d supply, fill #1

## 2023-05-04 MED ORDER — LETROZOLE 2.5 MG PO TABS: 2.5000 mg | ORAL_TABLET | Freq: Every day | ORAL | 3 refills | Status: DC

## 2023-05-04 NOTE — Telephone Encounter (Signed)
Oral Oncology Patient Advocate Encounter   Was successful in obtaining a copay card for Verzenio.  This copay card will make the patients copay $0.  The billing information is as follows and has been shared with [facility].   RxBin: [id number] PCN: [id number] Member ID: [id number] Group ID: [id number]   Jinger Neighbors, CPhT-Adv Oncology Pharmacy Patient Advocate [facility] Direct Number: [phone]  Fax: [phone]

## 2023-05-04 NOTE — Assessment & Plan Note (Addendum)
Left lumpectomy: Grade 2 IDC with DCIS, 1 cm, margins negative, DCIS focally 0.1 cm from superior margin, 2/2 lymph nodes positive, ER 95%, PR 45%, HER-2 negative, Ki-67 20% AXLND: 3/20 LN Pos (total 5/22)   Treatment Plan: 1. Adj chemo with DD Adriamycin and Cytoxan followed by Taxol weekly X 11 (stopped for neuropathy) completed 04/19/2019 2. Adj XRT 06/01/2019-07/21/2019 3. Adj Anti estrogen therapy --------------------------------------------------------------------------------------------- Current treatment: Anastrozole 1 mg daily started 07/26/2019   Bone density 05/05/2022: T score of 0.3: Normal   CT CAP 04/02/2023: Small bilateral pleural effusions, multiple small bilateral pulmonary nodules measuring up to 5 mm, right paratracheal and right hilar lymph nodes indeterminate.  Numerous lytic lesions in axial and appendical skeleton concerning for metastatic disease or myeloma.  Possible acute left seventh eighth ninth rib fractures hypodensities within the liver subcentimeter   Treatment plan: PET/CT scan 04/30/2023: Widespread bone metastases cervical thoracic lumbar spine sternum ribs shoulders and pelvis, hypermetabolic right paratracheal and right hilar lymphadenopathy, tiny pulmonary nodules: Patient has appointment to see Dr. Tonia Brooms for bronchoscopy and biopsy. Because of the delay in getting bronch and biopsy, we decided to add Verzinio and switch anastrozole to Letrozole Patient seeing Dr. Mitzi Hansen for palliative radiation   RTC in 2 weeks for xgeva and F/U with Jonny Ruiz

## 2023-05-04 NOTE — Progress Notes (Signed)
Patient Care Team: Zola Button, Grayling Congress, DO as PCP - General Jodi Geralds, MD as Consulting Physician (Orthopedic Surgery) Harriette Bouillon, MD as Consulting Physician (General Surgery) Serena Croissant, MD as Consulting Physician (Hematology and Oncology) Dorothy Puffer, MD as Consulting Physician (Radiation Oncology) Jethro Bolus, MD as Consulting Physician (Ophthalmology)  DIAGNOSIS:  Encounter Diagnosis  Name Primary?   Malignant neoplasm of upper-outer quadrant of left breast in female, estrogen receptor positive (HCC)     SUMMARY OF ONCOLOGIC HISTORY: Oncology History  Malignant neoplasm of upper-outer quadrant of left breast in female, estrogen receptor positive (HCC)  08/24/2018 Initial Biopsy   Screening detected left breast mass at 2:30 position 6 mm in size with one abnormal lymph node and stable benign calcifications, biopsy of the mass grade 2 IDC with DCIS and lymphovascular invasion, lymph node biopsy is positive, ER 95%, PR 40%, Ki-67 20%, HER-2 -1+ by IHC, T1b N1 stage Ib   08/31/2018 Cancer Staging   Staging form: Breast, AJCC 8th Edition - Clinical: Stage IB (cT1b, cN1(f), cM0, G2, ER+, PR+, HER2-) - Signed by Serena Croissant, MD on 08/31/2018   09/21/2018 Genetic Testing   Genetic testing performed through [facility]'s Common Hereditary Cancers Panel reported out on 09/19/2018 showed no pathogenic mutations.  The Common Hereditary Cancer Panel offered by [facility] includes sequencing and/or deletion duplication testing of the following 53 genes: APC, ATM, AXIN2, BARD1, BMPR1A, BRCA1, BRCA2, BRIP1, BUB1B, CDH1, CDK4, CDKN2A, CHEK2, CTNNA1, DICER1, ENG, EPCAM, GALNT12, GREM1, HOXB13, KIT, MEN1, MLH1, MLH3, MSH2, MSH3, MSH6, MUTYH, NBN, NF1, NTHL1, PALB2, PDGFRA, PMS2, POLD1, POLE, PTEN, RAD50, RAD51C, RAD51D, RNF43, RPS20, SDHA, SDHB, SDHC, SDHD, SMAD4, SMARCA4, STK11, TP53, TSC1, TSC2, VHL.   A variant of uncertain significance (VUS) in a gene called NBN was also noted. c.1979G>C  (p.Arg660Thr)   10/18/2018 Cancer Staging   Staging form: Breast, AJCC 8th Edition - Pathologic stage from 10/18/2018: Stage IB (pT1b, pN2a, cM0, G2, ER+, PR+, HER2-) - Signed by Serena Croissant, MD on 11/14/2018   11/09/2018 Surgery   Left lumpectomy ([facility]) [phone]): Grade 2 IDC with DCIS, 1 cm, margins negative, DCIS focally 0.1 cm from superior margin, 2/2 lymph nodes positive, ER 95%, PR 45%, HER-2 negative, Ki-67 20% AXLND: 3/20 LN Pos (total 5/22)   12/15/2018 -  Chemotherapy   DOXOrubicin (ADRIAMYCIN) chemo injection 142 mg, 60 mg/m2 = 142 mg, Intravenous,  Once, 4 of 4 cycles. Administration: 142 mg (12/15/2018), 142 mg (12/29/2018), 142 mg (01/12/2019), 142 mg (01/26/2019)  palonosetron (ALOXI) injection 0.25 mg, 0.25 mg, Intravenous,  Once, 4 of 4 cycles. Administration: 0.25 mg (12/15/2018), 0.25 mg (12/29/2018), 0.25 mg (01/12/2019), 0.25 mg (01/26/2019)  pegfilgrastim-cbqv (UDENYCA) injection 6 mg, 6 mg, Subcutaneous, Once, 4 of 4 cycles. Administration: 6 mg (12/17/2018), 6 mg (12/31/2018), 6 mg (01/14/2019), 6 mg (01/28/2019)  cyclophosphamide (CYTOXAN) 1,420 mg in sodium chloride 0.9 % 250 mL chemo infusion, 600 mg/m2 = 1,420 mg, Intravenous,  Once, 4 of 4 cycles. Administration: 1,420 mg (12/15/2018), 1,420 mg (12/29/2018), 1,420 mg (01/12/2019), 1,420 mg (01/26/2019)  PACLitaxel (TAXOL) 192 mg in sodium chloride 0.9 % 250 mL chemo infusion (</= 80mg /m2), 80 mg/m2 = 192 mg, Intravenous,  Once, 11 of 12 cycles. Dose modification: 60 mg/m2 (original dose 80 mg/m2, Cycle 12, Reason: Dose not tolerated). Administration: 192 mg (02/09/2019), 192 mg (02/16/2019), 192 mg (02/23/2019), 192 mg (03/02/2019), 192 mg (03/09/2019), 192 mg (03/16/2019), 192 mg (03/23/2019), 144 mg (03/30/2019), 144 mg (04/06/2019), 144 mg (04/14/2019), 144 mg (04/20/2019)  fosaprepitant (EMEND) 150  mg  dexamethasone (DECADRON) 12 mg in sodium chloride 0.9 % 145 mL IVPB, , Intravenous,  Once, 4 of 4 cycles Administration:  (12/15/2018),   (12/29/2018),  (01/12/2019),  (01/26/2019)     06/01/2019 - 07/21/2019 Radiation Therapy   The patient initially received a dose of 50.4 Gy in 28 fractions to the breast using whole-breast tangent fields. This was delivered using a 3-D conformal technique. The pt received a boost delivering an additional 10 Gy in 5 fractions using a electron boost with electrons. The total dose was 60.4 Gy.    08/2019 - 08/2024 Anti-estrogen oral therapy   Anastrozole     CHIEF COMPLIANT:   Discussed the use of AI scribe software for clinical note transcription with the patient, who gave verbal consent to proceed.  History of Present Illness   The patient, with a history of breast cancer, presents with widespread bone metastases. They report pain in the ribs bilaterally, right lower back, and upper shoulder. The patient has been experiencing constipation, but otherwise has regular bowel movements. They have a prescription for pain management, but have not been taking it regularly. The patient is currently on anastrozole.         ALLERGIES:  is allergic to erythromycin, lisinopril, penicillins, and adhesive [tape].  MEDICATIONS:  Current Outpatient Medications  Medication Sig Dispense Refill   abemaciclib (VERZENIO) 100 MG tablet Take 1 tablet (100 mg total) by mouth 2 (two) times daily. 60 tablet 3   letrozole (FEMARA) 2.5 MG tablet Take 1 tablet (2.5 mg total) by mouth daily. 90 tablet 3   amLODipine (NORVASC) 5 MG tablet Take 1 tablet (5 mg total) by mouth daily. Needs ov before any more refills 90 tablet 1   carvedilol (COREG) 25 MG tablet Take 1 tablet (25 mg total) by mouth 2 (two) times daily with a meal. Due for appt 03/2023 180 tablet 1   EPINEPHRINE 0.3 mg/0.3 mL IJ SOAJ injection INJECT 0.3 ML(1 SYRINGE) IN THE MUSCLE AS NEEDED FOR ALLERGIC REACTION (Patient not taking: Reported on 05/04/2023) 2 Device 0   gabapentin (NEURONTIN) 300 MG capsule Take 1 capsule (300 mg total) by mouth 2 (two)  times daily. 180 capsule 1   HYDROcodone-acetaminophen (NORCO) 5-325 MG tablet Take 1-2 tablets by mouth every 4 (four) hours as needed. 60 tablet 0   LORazepam (ATIVAN) 0.5 MG tablet TAKE 1 TABLET(0.5 MG) BY MOUTH AT BEDTIME AS NEEDED FOR SLEEP 30 tablet 1   meloxicam (MOBIC) 15 MG tablet Take 1 tablet (15 mg total) by mouth daily as needed for pain. 90 tablet 1   SYNTHROID 100 MCG tablet Take 1 tablet (100 mcg total) by mouth daily before breakfast. 90 tablet 1   valACYclovir (VALTREX) 1000 MG tablet TAKE 1 TABLET BY MOUTH THREE TIMES DAILY AS NEEDED 30 tablet 2   No current facility-administered medications for this visit.    PHYSICAL EXAMINATION: ECOG PERFORMANCE STATUS: 1 - Symptomatic but completely ambulatory  Vitals:   05/04/23 1348  BP: (!) 149/72  Pulse: 69  Resp: 18  Temp: (!) 97.2 F (36.2 C)  SpO2: 100%   Filed Weights   05/04/23 1348  Weight: 246 lb 3.2 oz (111.7 kg)      LABORATORY DATA:  I have reviewed the data as listed    Latest Ref Rng & Units 04/09/2023    2:13 PM 04/02/2023    8:06 PM 02/03/2022   12:03 PM  CMP  Glucose 65 - 99 mg/dL 94  130  91   BUN 7 - 25 mg/dL 22  17  13    Creatinine 0.50 - 1.05 mg/dL 1.61  0.96  0.45   Sodium 135 - 146 mmol/L 143  140  142   Potassium 3.5 - 5.3 mmol/L 4.3  3.5  4.5   Chloride 98 - 110 mmol/L 102  101  104   CO2 20 - 32 mmol/L 30  27  30    Calcium 8.6 - 10.4 mg/dL 40.9  81.1  9.3   Total Protein 6.1 - 8.1 g/dL 7.3  8.7  6.9   Total Bilirubin 0.2 - 1.2 mg/dL 0.7  1.0  0.9   Alkaline Phos 38 - 126 U/L  122  91   AST 10 - 35 U/L 22  19  12    ALT 6 - 29 U/L 19  15  11      Lab Results  Component Value Date   WBC 6.3 04/09/2023   HGB 14.2 04/09/2023   HCT 44.4 04/09/2023   MCV 83.9 04/09/2023   PLT 223 04/09/2023   NEUTROABS 4,076 04/09/2023    ASSESSMENT & PLAN:  Malignant neoplasm of upper-outer quadrant of left breast in female, estrogen receptor positive (HCC) Left lumpectomy: Grade 2 IDC with DCIS,  1 cm, margins negative, DCIS focally 0.1 cm from superior margin, 2/2 lymph nodes positive, ER 95%, PR 45%, HER-2 negative, Ki-67 20% AXLND: 3/20 LN Pos (total 5/22)   Treatment Plan: 1. Adj chemo with DD Adriamycin and Cytoxan followed by Taxol weekly X 11 (stopped for neuropathy) completed 04/19/2019 2. Adj XRT 06/01/2019-07/21/2019 3. Adj Anti estrogen therapy --------------------------------------------------------------------------------------------- Current treatment: Anastrozole 1 mg daily started 07/26/2019   Bone density 05/05/2022: T score of 0.3: Normal   CT CAP 04/02/2023: Small bilateral pleural effusions, multiple small bilateral pulmonary nodules measuring up to 5 mm, right paratracheal and right hilar lymph nodes indeterminate.  Numerous lytic lesions in axial and appendical skeleton concerning for metastatic disease or myeloma.  Possible acute left seventh eighth ninth rib fractures hypodensities within the liver subcentimeter   Treatment plan: PET/CT scan 04/30/2023: Widespread bone metastases cervical thoracic lumbar spine sternum ribs shoulders and pelvis, hypermetabolic right paratracheal and right hilar lymphadenopathy, tiny pulmonary nodules: Patient has appointment to see Dr. Tonia Brooms for bronchoscopy and biopsy. Because of the delay in getting bronch and biopsy, we decided to add Verzinio and switch anastrozole to Letrozole Patient seeing Dr. Mitzi Hansen for palliative radiation   RTC in 2 weeks for xgeva and F/U with Jonny Ruiz to discuss tolerance to Verzinio  ------------------------------------- Assessment and Plan    Metastatic Breast Cancer Widespread bone metastases identified on PET scan, causing pain in ribs, lower back, and shoulder. Original pathology was estrogen receptor positive. Awaiting biopsy results from lung specialist to confirm current pathology. -Discontinue Anastrozole. -Start Letrozole. -Plan to start Verzenio (middle dose, 1 pill twice daily) pending biopsy  results. Side effects discussed include diarrhea, fatigue, potential liver function impact, and rare lung inflammation. -Obtain Imodium for potential diarrhea management. -Initiate Xgeva injections every three months to strengthen bones and prevent fractures. -Check liver function regularly. -Plan for follow-up in two weeks with blood work and first [employer] injection.  Pain Management Current pain medication prescription issue with pharmacy due to timing of refill. -Continue current pain management regimen as prescribed.  -Follow-up with lung specialist for biopsy. -Consider radiation for pain relief, patient to discuss pros and cons with radiation specialist.          No orders  of the defined types were placed in this encounter.  The patient has a good understanding of the overall plan. she agrees with it. she will call with any problems that may develop before the next visit here. Total time spent: 30 mins including face to face time and time spent for planning, charting and co-ordination of care   Tamsen Meek, MD 05/04/23

## 2023-05-04 NOTE — Telephone Encounter (Signed)
Oral Oncology Patient Advocate Encounter   Received notification that prior authorization for Verzenio is required.   PA submitted on 05/04/23 Key [id number] Status is pending     Jinger Neighbors, CPhT-Adv Oncology Pharmacy Patient Advocate [facility] Direct Number: [phone]  Fax: [phone]

## 2023-05-04 NOTE — Telephone Encounter (Signed)
Oral Oncology Pharmacist Encounter  Received new prescription for Verzenio (abemaciclib) for the treatment of metastatic HR positive, HER2 negative breast cancer in conjunction with letrozole, planned duration until disease progression or unacceptable toxicity.  Patient is going to get new labs completed this week. Prescription dose and frequency assessed for appropriateness.   Current medication list in Epic reviewed, no significant/ relevant DDIs with Verzenio identified.   Evaluated chart and no patient barriers to medication adherence noted.   Patient agreement for treatment documented in MD note on 05/04/2023.  Prescription has been e-scribed to the [facility] for benefits analysis and approval.  Oral Oncology Clinic will continue to follow for insurance authorization, copayment issues, initial counseling and start date.  Kayla Banks, PharmD Hematology/Oncology Clinical Pharmacist [facility] Oral Chemotherapy [facility] [phone] 05/04/2023 3:07 PM

## 2023-05-05 ENCOUNTER — Ambulatory Visit
Admission: RE | Admit: 2023-05-05 | Discharge: 2023-05-05 | Disposition: A | Discharge status: Home / Self Care | Payer: BC Managed Care – PPO | Source: Ambulatory Visit | Attending: Radiation Oncology | Admitting: Radiation Oncology

## 2023-05-05 ENCOUNTER — Encounter: Payer: Self-pay | Admitting: Radiation Oncology

## 2023-05-05 ENCOUNTER — Other Ambulatory Visit (HOSPITAL_COMMUNITY): Payer: Self-pay

## 2023-05-05 ENCOUNTER — Inpatient Hospital Stay: Payer: BC Managed Care – PPO

## 2023-05-05 VITALS — Ht 66.0 in | Wt 246.0 lb

## 2023-05-05 DIAGNOSIS — C7951 Secondary malignant neoplasm of bone: Secondary | ICD-10-CM

## 2023-05-05 DIAGNOSIS — C50412 Malignant neoplasm of upper-outer quadrant of left female breast: Secondary | ICD-10-CM

## 2023-05-05 NOTE — Telephone Encounter (Signed)
Oral Oncology Patient Advocate Encounter  Prior Authorization for Kayla Banks has been approved.    PA# [id number] Effective dates: 05/04/23 through 05/03/24  Patients co-pay is $1,803.60.  Patient has Verzenio savings card to lower cost to $0    Jinger Neighbors, CPhT-Adv Oncology Pharmacy Patient Advocate [facility] Direct Number: [phone]  Fax: [phone]

## 2023-05-05 NOTE — Progress Notes (Addendum)
Radiation Oncology         ([phone]) [phone] ________________________________  Outpatient Re-Consultation - Conducted via telephone at patient request.  I spoke with the patient to conduct this consult visit via telephone. The patient was notified in advance and was offered an in person or telemedicine meeting to allow for face to face communication but instead preferred to proceed with a telephone consult.     Name: Kayla Banks        MRN: [id number]  Date of Service: 05/05/2023 DOB: [birth year]  XB:JYNWG Chase, Grayling Congress, DO  Serena Croissant, MD     REFERRING PHYSICIAN: Serena Croissant, MD  ------------------------------------------------------------------------------------------------ Addendum Please see the note from Laurence Aly, PA-C from today's visit for more details of today's encounter.  I have personally performed a face to face diagnostic evaluation on this patient and devised the following assessment and plan.  The patient underwent evaluation today for her recent diagnosis of stage IV disease.  She was previously treated for left-sided breast cancer in 2020.  She has a biopsy which is being scheduled through pulmonary medicine currently.  PET scan showed widespread bone metastases and the patient is having some pain associated with this involvement, particularly in the bilateral ribs, right hip, and to a lesser degree in the right shoulder.  I believe she is a good candidate for palliative radiation treatment to these regions if feasible.  We discussed that we often can treat up to 3 lesions pretty easily but it does have some greater difficulty to go beyond this.  We will have her wire out the worst areas when she comes in for simulation and then correlate this with her recent imaging.  We discussed the rationale of treatment and all of her questions were answered.  We will proceed with simulation as soon as possible for treatment planning.  Staging-stage IV breast cancer with bone  metastases, biopsy pending     Radene Gunning, MD, PhD ------------------------------------------------------------------------------------------------    DIAGNOSIS: The primary encounter diagnosis was Metastasis to bone [facility]). A diagnosis of Malignant neoplasm of upper-outer quadrant of left breast in female, estrogen receptor positive (HCC) was also pertinent to this visit.   HISTORY OF PRESENT ILLNESS: Kayla Banks is a 67 y.o. 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 A suspicious axillary node was also biopsied and consistent with carcinoma. Her tumor was  ER/PR positive, HER2 negative, Ki 67 of 40%. She underwent left lumpectomy with sentinel node biopsy on 10/11/2018 which revealed a 1 cm grade 2 invasive ductal carcinoma with associated DCIS. Her margins were negative for the invasive component, but the DCIS was focally 1 mm from the superior margin, and 2/2 nodes were positive. She underwent axillary lymph node dissection on 11/09/2018 which revealed 3 positive nodes out of 20 removed. The largest focus of cancer measured 5 mm without extranodal extension. She proceeded with adjuvant chemotherapy between 12/15/2018-04/20/2019. She then completed comprehensive left breast and regional node radiotherapy in December 2020.  She began Anastrozole in January 2021, and continued with this while being followed in surveillance. She developed progressive symptoms of pain in her chest wall and right shoulder and upper back and was seen in an urgent care clinic. Her symptoms persistent and ultimately she was seen in the ED on 04/02/23 also complaining  of a cough. A CXR showed mildly enlarged cardiac silhouette and CT Angio to rule out dissection of the CAP, showed no dissection or vascular abnormalities but  showed small bilateral pleural effusions, multiple small pulmonary nodules bilaterally, borderline right paratracheal and hilar nodes, and numerous lytic lesions throughout the axial and appendicular skeleton. There were possible acute left: seventh, eighth, and ninth lateral rib fractures. She had a PET scan on 04/26/23 that showed widespread hypermetabolism of the cervical, thoracic and lumbar spine, as well as the sternum, ribs, shoulders and pelvis. There were also hypermetabolic changes of the right paratracheal and hilar stations, and tiny bilateral nodules below resolution for PET but suspicious for metastatic disease. She's started on Letrozole with receive Xgeva, and is being referred to pulmonary to see about sampling her mediastinal nodes. If positive she will start Verzenio. She's contacted by phone today to discuss palliative radiotherapy to painful bone disease.     PREVIOUS RADIATION THERAPY:  06/01/2019-07/21/2019: The left breast and regional nodes were treated to 43.2 Gy over 24 fractions Gy, followed by 7.2 Gy over 4 fractions after replanning and a 10 Gy boost over 5 fractions.    PAST MEDICAL HISTORY:  Past Medical History:  Diagnosis Date   Breast cancer (HCC)    Complication of anesthesia    Family history of breast cancer    Heart murmur    no problems, saw cardiologist 2010   Hypertension    on meds   Hypothyroidism    Osteoarthritis    Personal history of chemotherapy    Personal history of radiation therapy    left   Plantar fasciitis    PONV (postoperative nausea and vomiting)    Thyroid disease    Hypothyroidism       PAST SURGICAL HISTORY: Past Surgical History:  Procedure Laterality Date   ABDOMINAL HYSTERECTOMY  02/2012   AXILLARY LYMPH NODE DISSECTION Left 11/09/2018   Procedure: LEFT AXILLARY LYMPH NODE DISSECTION;  Surgeon: Harriette Bouillon, MD;  Location: [facility];  Service: General;  Laterality: Left;   BREAST BIOPSY Left     malignant   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/11/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   BREAST LUMPECTOMY WITH RADIOACTIVE SEED AND SENTINEL LYMPH NODE BIOPSY Left 10/11/2018   Procedure: LEFT BREAST LUMPECTOMY WITH RADIOACTIVE SEED AND LEFT SENTINEL LYMPH NODE MAPPING WITH LEFT TARGETED LYMPH NODE BIOPSY;  Surgeon: Harriette Bouillon, MD;  Location: [facility];  Service: General;  Laterality: Left;   CATARACT EXTRACTION Bilateral 11/3  06/26/20   shipiro    FOOT SURGERY Left    bone spurs   LUMBAR LAMINECTOMY  1990   PORT-A-CATH REMOVAL N/A 05/23/2019   Procedure: PORT REMOVAL;  Surgeon: Harriette Bouillon, MD;  Location: [facility];  Service: General;  Laterality: N/A;   PORTACATH PLACEMENT Right 12/01/2018   Procedure: INSERTION PORT-A-CATH WITH ULTRASOUND;  Surgeon: Harriette Bouillon, MD;  Location:  SURGERY CENTER;  Service: General;  Laterality: Right;     FAMILY HISTORY:  Family History  Problem Relation Age of Onset   Diabetes Mother    Hyperlipidemia Mother    Hypertension Mother    Hypertension Father    Heart disease Father 51       ?MI   Hypothyroidism Sister    Hyperthyroidism Sister    Breast cancer Maternal Aunt        dx 60's/70's   Breast cancer Other  dx early 75's     SOCIAL HISTORY:  reports that she has never smoked. She has never used smokeless tobacco. She reports that she does not currently use alcohol. She reports that she does not use drugs. The patient is single and lives in [place]. 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    ALLERGIES: Erythromycin, Lisinopril, Penicillins, and Adhesive [tape]   MEDICATIONS:  Current Outpatient Medications  Medication Sig Dispense Refill   abemaciclib (VERZENIO) 100 MG tablet Take 1 tablet (100 mg total) by mouth 2 (two) times daily. 60 tablet 3   amLODipine (NORVASC) 5 MG tablet Take 1 tablet (5 mg total) by mouth daily. Needs ov before any  more refills 90 tablet 1   carvedilol (COREG) 25 MG tablet Take 1 tablet (25 mg total) by mouth 2 (two) times daily with a meal. Due for appt 03/2023 180 tablet 1   gabapentin (NEURONTIN) 300 MG capsule Take 1 capsule (300 mg total) by mouth 2 (two) times daily. 180 capsule 1   HYDROcodone-acetaminophen (NORCO) 5-325 MG tablet Take 1-2 tablets by mouth every 4 (four) hours as needed. 60 tablet 0   letrozole (FEMARA) 2.5 MG tablet Take 1 tablet (2.5 mg total) by mouth daily. 90 tablet 3   LORazepam (ATIVAN) 0.5 MG tablet TAKE 1 TABLET(0.5 MG) BY MOUTH AT BEDTIME AS NEEDED FOR SLEEP 30 tablet 1   meloxicam (MOBIC) 15 MG tablet Take 1 tablet (15 mg total) by mouth daily as needed for pain. 90 tablet 1   SYNTHROID 100 MCG tablet Take 1 tablet (100 mcg total) by mouth daily before breakfast. 90 tablet 1   valACYclovir (VALTREX) 1000 MG tablet TAKE 1 TABLET BY MOUTH THREE TIMES DAILY AS NEEDED 30 tablet 2   EPINEPHRINE 0.3 mg/0.3 mL IJ SOAJ injection INJECT 0.3 ML(1 SYRINGE) IN THE MUSCLE AS NEEDED FOR ALLERGIC REACTION (Patient not taking: Reported on 05/04/2023) 2 Device 0   No current facility-administered medications for this encounter.     REVIEW OF SYSTEMS: On review of systems, the patient reports that she is having a lot of pain in her ribcage on both sides. Her right side is more uncomfortable and is along the flank region of the wall of the chest. She has right hip pain as well that creates pain with walking, and some right upper shoulder pain as well (all sites described in order of severity). She is taking Hydrocodone/APAP, and has made some adjustments in her dosing of this which help improves her pain but does not completely eliminate this. No other complaints are verbalized.      PHYSICAL EXAM:   Unable to assess due to encounter type   ECOG =1  0 - Asymptomatic (Fully active, able to carry on all predisease activities without restriction)  1 - Symptomatic but completely ambulatory  (Restricted in physically strenuous activity but ambulatory and able to carry out work of a light or sedentary nature. For example, light housework, office work)  2 - Symptomatic, <50% in bed during the day (Ambulatory and capable of all self care but unable to carry out any work activities. Up and about more than 50% of waking hours)  3 - Symptomatic, >50% in bed, but not bedbound (Capable of only limited self-care, confined to bed or chair 50% or more of waking hours)  4 - Bedbound (Completely disabled. Cannot carry on any self-care. Totally confined to bed or chair)  5 - Death   Santiago Glad  MM, Creech RH, Tormey DC, et al. [phone]). "Toxicity and response criteria of the [facility]". Am. Evlyn Clines. Oncol. 5 (6): 649-55    LABORATORY DATA:  Lab Results  Component Value Date   WBC 6.3 04/09/2023   HGB 14.2 04/09/2023   HCT 44.4 04/09/2023   MCV 83.9 04/09/2023   PLT 223 04/09/2023   Lab Results  Component Value Date   NA 143 04/09/2023   K 4.3 04/09/2023   CL 102 04/09/2023   CO2 30 04/09/2023   Lab Results  Component Value Date   ALT 19 04/09/2023   AST 22 04/09/2023   ALKPHOS 122 04/02/2023   BILITOT 0.7 04/09/2023      RADIOGRAPHY: NM PET Image Initial (PI) Skull Base To Thigh  Result Date: 04/30/2023 CLINICAL DATA:  Initial treatment strategy for breast cancer. EXAM: NUCLEAR MEDICINE PET SKULL BASE TO THIGH TECHNIQUE: 12.2 mCi F-18 FDG was injected intravenously. Full-ring PET imaging was performed from the skull base to thigh after the radiotracer. CT data was obtained and used for attenuation correction and anatomic localization. Fasting blood glucose: 94 mg/dl COMPARISON:  CTA chest abdomen pelvis 04/02/2023 FINDINGS: Mediastinal blood pool activity: SUV max 2.8 Liver activity: SUV max NA NECK: Symmetric uptake is identified in the tonsillar region bilaterally, potentially reactive. Incidental CT findings: None. CHEST: Right paratracheal and hilar  lymphadenopathy seen on previous chest CT is hypermetabolic with uptake in the right paratracheal node demonstrating SUV max = 6.6 in right hilar hypermetabolism at SUV max = 6.9. No hypermetabolic axillary lymphadenopathy. No hypermetabolic breast lesion evident.Tiny bilateral pulmonary nodules are noted, better demonstrated on the previous chest CTA. While these are concerning for metastatic disease, they are below size threshold for resolution on PET imaging. Incidental CT findings: Coronary artery calcification is evident. Trace right pleural effusion. Skin thickening noted left breast. ABDOMEN/PELVIS: No abnormal hypermetabolic activity within the liver, pancreas, adrenal glands, or spleen. No hypermetabolic lymph nodes in the abdomen or pelvis. Incidental CT findings: Punctate nonobstructing stone identified interpolar left kidney. Mild diverticular disease noted left colon without diverticulitis. SKELETON: Widespread hypermetabolic bone metastases evident. Index 2.4 cm lesion right T12 vertebral body demonstrates SUV max = 10.1. 1.7 cm posterior right iliac lesion on 143/4 demonstrates SUV max = 11.7. Incidental CT findings: None. IMPRESSION: 1. Widespread hypermetabolic bone metastases involving cervical, thoracic, and lumbar spine as well as the sternum, ribs, bony anatomy of the shoulders, and bony pelvis. 2. Hypermetabolic right paratracheal and right hilar lymphadenopathy consistent with metastatic disease. 3. Tiny bilateral pulmonary nodules are below size threshold for resolution on PET imaging but raise concern for metastatic disease. 4. No evidence for hypermetabolic metastatic disease in the soft tissue organ anatomy of the abdomen/pelvis. Electronically Signed   By: Kennith Center M.D.   On: 04/30/2023 09:37       IMPRESSION/PLAN: 1. 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 Dr. Mitzi Hansen discusses the patient's course to date and recent work up. He agrees with the plans for meeting with Dr. Tonia Brooms and to consider EBUS with mediastinal biopsy. Dr. Mitzi Hansen also recommends a course of palliative radiotherapy. After discussing her symptoms, Dr. Mitzi Hansen believes the bilateral ribs (as separate isocenters) and the right hip/pelvis can be treated for palliative purposes to improve pain. We discussed the risks, benefits, short, and long term effects of radiotherapy, as well as the palliative intent, and the  patient is interested in proceeding. Dr. Mitzi Hansen discusses the delivery and logistics of radiotherapy and anticipates a course of 2 weeks of radiotherapy. The patient will simulate tomorrow at which time she will sign consent to proceed and we will ask our staff to place wires over the sites she has pain to better plan the location of her therapy.    This encounter was conducted via telephone.  The patient has provided two factor identification and has given verbal consent for this type of encounter and has been advised to only accept a meeting of this type in a secure network environment. The time spent during this encounter was 60 minutes including preparation, discussion, and coordination of the patient's care. The attendants for this meeting include Rober Minion, RN, Dr. Mitzi Hansen, Ronny Bacon  and [facility].  During the encounter,  Rober Minion, RN, Dr. Mitzi Hansen, and Ronny Bacon were located at [facility] Radiation Oncology Department.  Kayla Banks was located at home.     The above documentation reflects my direct findings during this shared patient visit. Please see the separate note by Dr. Mitzi Hansen on this date for the remainder of the patient's plan of care.    Osker Mason, PAC

## 2023-05-06 ENCOUNTER — Inpatient Hospital Stay: Payer: BC Managed Care – PPO

## 2023-05-06 ENCOUNTER — Other Ambulatory Visit (HOSPITAL_COMMUNITY): Payer: Self-pay

## 2023-05-06 ENCOUNTER — Ambulatory Visit
Admission: RE | Admit: 2023-05-06 | Discharge: 2023-05-06 | Disposition: A | Discharge status: Home / Self Care | Payer: BC Managed Care – PPO | Source: Ambulatory Visit | Attending: Radiation Oncology | Admitting: Radiation Oncology

## 2023-05-06 DIAGNOSIS — Z170 Estrogen receptor positive status [ER+]: Secondary | ICD-10-CM

## 2023-05-06 DIAGNOSIS — Z510 Encounter for antineoplastic radiation therapy: Secondary | ICD-10-CM | POA: Diagnosis not present

## 2023-05-06 LAB — CMP (CANCER CENTER ONLY)
ALT: 21 U/L (ref 0–44)
AST: 25 U/L (ref 15–41)
Albumin: 3.7 g/dL (ref 3.5–5.0)
Alkaline Phosphatase: 126 U/L (ref 38–126)
Anion gap: 7 (ref 5–15)
BUN: 25 mg/dL — ABNORMAL HIGH (ref 8–23)
CO2: 34 mmol/L — ABNORMAL HIGH (ref 22–32)
Calcium: 10.7 mg/dL — ABNORMAL HIGH (ref 8.9–10.3)
Chloride: 103 mmol/L (ref 98–111)
Creatinine: 1.15 mg/dL — ABNORMAL HIGH (ref 0.44–1.00)
GFR, Estimated: 53 mL/min — ABNORMAL LOW (ref >60)
Glucose, Bld: 103 mg/dL — ABNORMAL HIGH (ref 70–99)
Potassium: 3.7 mmol/L (ref 3.5–5.1)
Sodium: 144 mmol/L (ref 135–145)
Total Bilirubin: 0.7 mg/dL (ref 0.3–1.2)
Total Protein: 7.3 g/dL (ref 6.5–8.1)

## 2023-05-06 LAB — CBC WITH DIFFERENTIAL (CANCER CENTER ONLY)
Abs Immature Granulocytes: 0.07 10*3/uL (ref 0.00–0.07)
Basophils Absolute: 0 10*3/uL (ref 0.0–0.1)
Basophils Relative: 1 %
Eosinophils Absolute: 0.3 10*3/uL (ref 0.0–0.5)
Eosinophils Relative: 3 %
HCT: 36.8 % (ref 36.0–46.0)
Hemoglobin: 12.1 g/dL (ref 12.0–15.0)
Immature Granulocytes: 1 %
Lymphocytes Relative: 23 %
Lymphs Abs: 2.1 10*3/uL (ref 0.7–4.0)
MCH: 27.9 pg (ref 26.0–34.0)
MCHC: 32.9 g/dL (ref 30.0–36.0)
MCV: 85 fL (ref 80.0–100.0)
Monocytes Absolute: 0.5 10*3/uL (ref 0.1–1.0)
Monocytes Relative: 6 %
Neutro Abs: 5.9 10*3/uL (ref 1.7–7.7)
Neutrophils Relative %: 66 %
Platelet Count: 225 10*3/uL (ref 150–400)
RBC: 4.33 MIL/uL (ref 3.87–5.11)
RDW: 14.2 % (ref 11.5–15.5)
WBC Count: 8.8 10*3/uL (ref 4.0–10.5)
nRBC: 0 % (ref 0.0–0.2)

## 2023-05-07 ENCOUNTER — Encounter: Payer: Self-pay | Admitting: Hematology and Oncology

## 2023-05-07 ENCOUNTER — Other Ambulatory Visit: Payer: Self-pay

## 2023-05-07 ENCOUNTER — Other Ambulatory Visit: Payer: Self-pay | Admitting: *Deleted

## 2023-05-07 ENCOUNTER — Other Ambulatory Visit (HOSPITAL_COMMUNITY): Payer: Self-pay

## 2023-05-07 MED ORDER — ONDANSETRON HCL 8 MG PO TABS: ORAL_TABLET | ORAL | 3 refills | Status: DC

## 2023-05-07 NOTE — Addendum Note (Signed)
Encounter addended by: Dorothy Puffer, MD on: 05/07/2023 2:33 PM  Actions taken: Clinical Note Signed

## 2023-05-07 NOTE — Telephone Encounter (Signed)
Oral Chemotherapy Pharmacist Encounter  I spoke with patient for overview of: Verzenio for the treatment of metastatic, hormone-receptor positive breast cancer, in combination with letrozole, planned duration until disease progression or unacceptable toxicity.   Counseled patient on administration, dosing, side effects, monitoring, drug-food interactions, safe handling, storage, and disposal.  Patient will take Verzenio 100mg  tablets, 1 tablet by mouth twice daily without regard to food.  Patient knows to avoid grapefruit and grapefruit juice.  Verzenio start date: 05/10/2023  Adverse effects include but are not limited to: diarrhea, fatigue, nausea, abdominal pain, decreased blood counts, and increased liver function tests, and joint pains. Severe, life-threatening, and/or fatal interstitial lung disease (ILD) and/or pneumonitis may occur with CDK 4/6 inhibitors.  Patient does not have anti-emetic on hand. Message sent to MD for prescriptions to be sent as patient has been having some nausea for the past week.  Patient will obtain anti diarrheal and alert the office of 4 or more loose stools above baseline. Discussed with patient how to take the Imodium if she does have diarrhea and to take 2 tablets with the first diarrhea in a 24 hour period. For any subsequent diarrheas, she should take 1 tablet with a max of 8 tablets in 24 hours. Patient knows to call if she is having 4 above her baseline in 24 hours.   Reviewed with patient importance of keeping a medication schedule and plan for any missed doses. No barriers to medication adherence identified.  Patient had labs completed on 05/06/23, no interventions needed. Creatinine slightly elevated since last labs, notified patient to continue drinking enough fluids. Prescription dose and frequency assessed for appropriateness.   Medication reconciliation performed and medication/allergy list updated.  Insurance authorization for [employer] has been  obtained. Test claim at the pharmacy revealed copayment $0 for 1st fill of 28 days. Patient has copay card that will cover patients cost.  Patient is picking up medication from [facility] long outpatient pharmacy on 05/10/2023.   Patient informed the pharmacy will reach out 5-7 days prior to needing next fill of Verzenio to coordinate continued medication acquisition to prevent break in therapy.  All questions answered.  Patient voiced understanding and appreciation.   Medication education handout placed in mail for patient. Patient knows to call the office with questions or concerns. Oral Chemotherapy Clinic phone number provided to patient.   Bethel Born, PharmD Hematology/Oncology Clinical Pharmacist [facility] Oral Chemotherapy Navigation Clinic  [phone] 05/07/2023   8:59 AM

## 2023-05-10 ENCOUNTER — Other Ambulatory Visit: Payer: Self-pay

## 2023-05-10 ENCOUNTER — Ambulatory Visit: Payer: BC Managed Care – PPO | Admitting: Hematology and Oncology

## 2023-05-10 ENCOUNTER — Ambulatory Visit
Admission: RE | Admit: 2023-05-10 | Discharge: 2023-05-10 | Disposition: A | Discharge status: Home / Self Care | Payer: BC Managed Care – PPO | Source: Ambulatory Visit | Attending: Radiation Oncology | Admitting: Radiation Oncology

## 2023-05-10 ENCOUNTER — Ambulatory Visit (INDEPENDENT_AMBULATORY_CARE_PROVIDER_SITE_OTHER): Payer: BC Managed Care – PPO | Admitting: Pulmonary Disease

## 2023-05-10 ENCOUNTER — Encounter: Payer: Self-pay | Admitting: Pulmonary Disease

## 2023-05-10 ENCOUNTER — Encounter: Payer: Self-pay | Admitting: Hematology and Oncology

## 2023-05-10 ENCOUNTER — Other Ambulatory Visit (HOSPITAL_COMMUNITY): Payer: Self-pay

## 2023-05-10 ENCOUNTER — Encounter: Payer: Self-pay | Admitting: Internal Medicine

## 2023-05-10 VITALS — BP 124/62 | HR 63 | Ht 66.0 in | Wt 246.0 lb

## 2023-05-10 DIAGNOSIS — R599 Enlarged lymph nodes, unspecified: Secondary | ICD-10-CM

## 2023-05-10 DIAGNOSIS — Z853 Personal history of malignant neoplasm of breast: Secondary | ICD-10-CM

## 2023-05-10 DIAGNOSIS — Z510 Encounter for antineoplastic radiation therapy: Secondary | ICD-10-CM | POA: Diagnosis not present

## 2023-05-10 DIAGNOSIS — R942 Abnormal results of pulmonary function studies: Secondary | ICD-10-CM

## 2023-05-10 LAB — RAD ONC ARIA SESSION SUMMARY
Course Elapsed Days: 0
Plan Fractions Treated to Date: 1
Plan Fractions Treated to Date: 1
Plan Fractions Treated to Date: 1
Plan Prescribed Dose Per Fraction: 3 Gy
Plan Prescribed Dose Per Fraction: 3 Gy
Plan Prescribed Dose Per Fraction: 3 Gy
Plan Total Fractions Prescribed: 10
Plan Total Fractions Prescribed: 10
Plan Total Fractions Prescribed: 10
Plan Total Prescribed Dose: 30 Gy
Plan Total Prescribed Dose: 30 Gy
Plan Total Prescribed Dose: 30 Gy
Reference Point Dosage Given to Date: 3 Gy
Reference Point Dosage Given to Date: 3 Gy
Reference Point Dosage Given to Date: 3 Gy
Reference Point Session Dosage Given: 3 Gy
Reference Point Session Dosage Given: 3 Gy
Reference Point Session Dosage Given: 3 Gy
Session Number: 1

## 2023-05-10 NOTE — H&P (View-Only) (Signed)
Synopsis: Referred in September 2024 for adenopathy by Zola Button, Grayling Congress, *  Subjective:   PATIENT ID: Kayla Banks GENDER: female DOB: 07-Feb-1956, MRN: [id number]  Chief Complaint  Patient presents with   Establish Care    Lung Nodule    This is a 67 year old female, past medical history of breast cancer, family history of breast cancer history of chemotherapy and radiation.  Patient has a history of hypertension.  Patient hadThe patient had restaging CT imaging.  Ultimately had a CT scan that was done in the emergency department was found to have osseous metastatic disease and hilar adenopathy.  Had a nuclear medicine PET scan completed on 04/30/2023 which revealed widespread metabolic bone disease cervical thoracic lumbar spine bony rib involvement shoulders.  Had hypermetabolic right paratracheal and hilar adenopathy concern for metastatic breast.    Past Medical History:  Diagnosis Date   Breast cancer (HCC)    Complication of anesthesia    Family history of breast cancer    Heart murmur    no problems, saw cardiologist 2010   Hypertension    on meds   Hypothyroidism    Osteoarthritis    Personal history of chemotherapy    Personal history of radiation therapy    left   Plantar fasciitis    PONV (postoperative nausea and vomiting)    Thyroid disease    Hypothyroidism     Family History  Problem Relation Age of Onset   Diabetes Mother    Hyperlipidemia Mother    Hypertension Mother    Hypertension Father    Heart disease Father 21       ?MI   Hypothyroidism Sister    Hyperthyroidism Sister    Breast cancer Maternal Aunt        dx 60's/70's   Breast cancer Other        dx early 57's     Past Surgical History:  Procedure Laterality Date   ABDOMINAL HYSTERECTOMY  02/2012   AXILLARY LYMPH NODE DISSECTION Left 11/09/2018   Procedure: LEFT AXILLARY LYMPH NODE DISSECTION;  Surgeon: Harriette Bouillon, MD;  Location: [facility];  Service:  General;  Laterality: Left;   BREAST BIOPSY Left    malignant   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/11/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   BREAST LUMPECTOMY WITH RADIOACTIVE SEED AND SENTINEL LYMPH NODE BIOPSY Left 10/11/2018   Procedure: LEFT BREAST LUMPECTOMY WITH RADIOACTIVE SEED AND LEFT SENTINEL LYMPH NODE MAPPING WITH LEFT TARGETED LYMPH NODE BIOPSY;  Surgeon: Harriette Bouillon, MD;  Location: [facility];  Service: General;  Laterality: Left;   CATARACT EXTRACTION Bilateral 11/3  06/26/20   shipiro    FOOT SURGERY Left    bone spurs   LUMBAR LAMINECTOMY  1990   PORT-A-CATH REMOVAL N/A 05/23/2019   Procedure: PORT REMOVAL;  Surgeon: Harriette Bouillon, MD;  Location: [facility];  Service: General;  Laterality: N/A;   PORTACATH PLACEMENT Right 12/01/2018   Procedure: INSERTION PORT-A-CATH WITH ULTRASOUND;  Surgeon: Harriette Bouillon, MD;  Location: [facility];  Service: General;  Laterality: Right;    Social History   Socioeconomic History   Marital status: Single    Spouse name: Not on file   Number of children: Not on file   Years of education: Not on file   Highest education level: Master's degree (e.g., MA, MS, MEng, MEd, MSW, MBA)  Occupational History   Occupation: [employer]  Tobacco Use   Smoking status: Never   Smokeless tobacco: Never  Vaping Use   Vaping status: Never Used  Substance and Sexual Activity   Alcohol use: Not Currently    Comment: occassionally   Drug use: Never   Sexual activity: Not Currently    Partners: Male  Other Topics Concern   Not on file  Social History Narrative   Exercise-- no   Social Determinants of Health   Financial Resource Strain: Low Risk  (04/02/2023)   Overall Financial Resource Strain (CARDIA)    Difficulty of Paying Living Expenses: Not very hard  Food Insecurity: No Food Insecurity (04/02/2023)   Hunger Vital Sign    Worried About Running Out of Food in the Last Year:  Never true    Ran Out of Food in the Last Year: Never true  Transportation Needs: No Transportation Needs (04/02/2023)   PRAPARE - Administrator, Civil Service (Medical): No    Lack of Transportation (Non-Medical): No  Physical Activity: Unknown (04/02/2023)   Exercise Vital Sign    Days of Exercise per Week: 0 days    Minutes of Exercise per Session: Not on file  Stress: Stress Concern Present (04/02/2023)   [employer] of Occupational Health - Occupational Stress Questionnaire    Feeling of Stress : To some extent  Social Connections: Moderately Integrated (04/02/2023)   Social Connection and Isolation Panel [NHANES]    Frequency of Communication with Friends and Family: Once a week    Frequency of Social Gatherings with Friends and Family: More than three times a week    Attends Religious Services: More than 4 times per year    Active Member of [employer] or Organizations: Yes    Attends Engineer, structural: More than 4 times per year    Marital Status: Never married  Catering manager Violence: Not on file     Allergies  Allergen Reactions   Erythromycin Swelling    Lip swelling; angioedema   Lisinopril Swelling    REACTION: angioedema   Penicillins Hives and Swelling    Swelling in arms & hands   Adhesive [Tape] Rash     Outpatient Medications Prior to Visit  Medication Sig Dispense Refill   abemaciclib (VERZENIO) 100 MG tablet Take 1 tablet (100 mg total) by mouth 2 (two) times daily. 60 tablet 3   amLODipine (NORVASC) 5 MG tablet Take 1 tablet (5 mg total) by mouth daily. Needs ov before any more refills 90 tablet 1   carvedilol (COREG) 25 MG tablet Take 1 tablet (25 mg total) by mouth 2 (two) times daily with a meal. Due for appt 03/2023 180 tablet 1   EPINEPHRINE 0.3 mg/0.3 mL IJ SOAJ injection INJECT 0.3 ML(1 SYRINGE) IN THE MUSCLE AS NEEDED FOR ALLERGIC REACTION 2 Device 0   gabapentin (NEURONTIN) 300 MG capsule Take 1 capsule (300 mg total) by  mouth 2 (two) times daily. 180 capsule 1   HYDROcodone-acetaminophen (NORCO) 5-325 MG tablet Take 1-2 tablets by mouth every 4 (four) hours as needed. 60 tablet 0   letrozole (FEMARA) 2.5 MG tablet Take 1 tablet (2.5 mg total) by mouth daily. 90 tablet 3   LORazepam (ATIVAN) 0.5 MG tablet TAKE 1 TABLET(0.5 MG) BY MOUTH AT BEDTIME AS NEEDED FOR SLEEP 30 tablet 1   meloxicam (MOBIC) 15 MG tablet Take 1 tablet (15 mg total) by mouth daily as needed for pain. 90 tablet 1   ondansetron (ZOFRAN) 8 MG tablet 1/2  to 1 tablet q 12 hours prn nausea 20 tablet 3   SYNTHROID 100 MCG tablet Take 1 tablet (100 mcg total) by mouth daily before breakfast. 90 tablet 1   valACYclovir (VALTREX) 1000 MG tablet TAKE 1 TABLET BY MOUTH THREE TIMES DAILY AS NEEDED 30 tablet 2   No facility-administered medications prior to visit.    Review of Systems  Constitutional:  Negative for chills, fever, malaise/fatigue and weight loss.  HENT:  Negative for hearing loss, sore throat and tinnitus.   Eyes:  Negative for blurred vision and double vision.  Respiratory:  Negative for cough, hemoptysis, sputum production, shortness of breath, wheezing and stridor.   Cardiovascular:  Negative for chest pain, palpitations, orthopnea, leg swelling and PND.  Gastrointestinal:  Negative for abdominal pain, constipation, diarrhea, heartburn, nausea and vomiting.  Genitourinary:  Negative for dysuria, hematuria and urgency.  Musculoskeletal:  Negative for joint pain and myalgias.  Skin:  Negative for itching and rash.  Neurological:  Negative for dizziness, tingling, weakness and headaches.  Endo/Heme/Allergies:  Negative for environmental allergies. Does not bruise/bleed easily.  Psychiatric/Behavioral:  Negative for depression. The patient is not nervous/anxious and does not have insomnia.   All other systems reviewed and are negative.    Objective:  Physical Exam Vitals reviewed.  Constitutional:      General: She is not in  acute distress.    Appearance: She is well-developed. She is obese.  HENT:     Head: Normocephalic and atraumatic.  Eyes:     General: No scleral icterus.    Conjunctiva/sclera: Conjunctivae normal.     Pupils: Pupils are equal, round, and reactive to light.  Neck:     Vascular: No JVD.     Trachea: No tracheal deviation.  Cardiovascular:     Rate and Rhythm: Normal rate and regular rhythm.     Heart sounds: No murmur heard. Pulmonary:     Effort: Pulmonary effort is normal. No tachypnea, accessory muscle usage or respiratory distress.     Breath sounds: No stridor. No wheezing, rhonchi or rales.  Abdominal:     General: Bowel sounds are normal. There is no distension.     Palpations: Abdomen is soft.     Tenderness: There is no abdominal tenderness.  Musculoskeletal:        General: No tenderness.     Cervical back: Neck supple.  Lymphadenopathy:     Cervical: No cervical adenopathy.  Skin:    General: Skin is warm and dry.     Capillary Refill: Capillary refill takes less than 2 seconds.     Findings: No rash.  Neurological:     Mental Status: She is alert and oriented to person, place, and time.  Psychiatric:        Behavior: Behavior normal.      Vitals:   05/10/23 1332  BP: 124/62  Pulse: 63  SpO2: 98%  Weight: 246 lb (111.6 kg)  Height: 5\' 6"  (1.676 m)   98% on RA BMI Readings from Last 3 Encounters:  05/10/23 39.71 kg/m  05/05/23 39.71 kg/m  05/04/23 39.74 kg/m   Wt Readings from Last 3 Encounters:  05/10/23 246 lb (111.6 kg)  05/05/23 246 lb (111.6 kg)  05/04/23 246 lb 3.2 oz (111.7 kg)     CBC    Component Value Date/Time   WBC 8.8 05/06/2023 1339   WBC 6.3 04/09/2023 1413   RBC 4.33 05/06/2023 1339   HGB 12.1 05/06/2023 1339   HCT  36.8 05/06/2023 1339   PLT 225 05/06/2023 1339   MCV 85.0 05/06/2023 1339   MCH 27.9 05/06/2023 1339   MCHC 32.9 05/06/2023 1339   RDW 14.2 05/06/2023 1339   LYMPHSABS 2.1 05/06/2023 1339   MONOABS 0.5  05/06/2023 1339   EOSABS 0.3 05/06/2023 1339   BASOSABS 0.0 05/06/2023 1339     Chest Imaging: Nuclear medicine pet imaging September 2024: Hypermetabolic adenopathy within the right chest as well as bony disease concerning for stage IV malignancy. The patient's images have been independently reviewed by me.    Pulmonary Functions Testing Results:     No data to display          FeNO:   Pathology:   Echocardiogram:   Heart Catheterization:     Assessment & Plan:     ICD-10-CM   1. Adenopathy  R59.9 Procedural/ Surgical Case Request: VIDEO BRONCHOSCOPY WITH ENDOBRONCHIAL ULTRASOUND    Ambulatory referral to Pulmonology    2. Abnormal PET scan of lung  R94.2     3. History of breast cancer  Z85.3       Discussion: This is a 67 year old female, past medical history of breast cancer in 2020.  Plan: Patient now has abnormal nuclear medicine pet imaging concerning for widely metastatic osseous bone disease as well as hilar adenopathy in the right side.  Today in the office we talked with risk-benefit alternative proceed with bronchoscopy and biopsy. Patient needs this procedure done.  We talked about the risk of bleeding and pneumothorax. Due to availability of endoscopy space we will try to get this done as urgently as possible with one of my partners. Dr. Katrinka Blazing has agreed to do the patient on Thursday afternoon. We appreciate PCC's help with scheduling an endoscopy being flexible.    Current Outpatient Medications:    abemaciclib (VERZENIO) 100 MG tablet, Take 1 tablet (100 mg total) by mouth 2 (two) times daily., Disp: 60 tablet, Rfl: 3   amLODipine (NORVASC) 5 MG tablet, Take 1 tablet (5 mg total) by mouth daily. Needs ov before any more refills, Disp: 90 tablet, Rfl: 1   carvedilol (COREG) 25 MG tablet, Take 1 tablet (25 mg total) by mouth 2 (two) times daily with a meal. Due for appt 03/2023, Disp: 180 tablet, Rfl: 1   EPINEPHRINE 0.3 mg/0.3 mL IJ SOAJ  injection, INJECT 0.3 ML(1 SYRINGE) IN THE MUSCLE AS NEEDED FOR ALLERGIC REACTION, Disp: 2 Device, Rfl: 0   gabapentin (NEURONTIN) 300 MG capsule, Take 1 capsule (300 mg total) by mouth 2 (two) times daily., Disp: 180 capsule, Rfl: 1   HYDROcodone-acetaminophen (NORCO) 5-325 MG tablet, Take 1-2 tablets by mouth every 4 (four) hours as needed., Disp: 60 tablet, Rfl: 0   letrozole (FEMARA) 2.5 MG tablet, Take 1 tablet (2.5 mg total) by mouth daily., Disp: 90 tablet, Rfl: 3   LORazepam (ATIVAN) 0.5 MG tablet, TAKE 1 TABLET(0.5 MG) BY MOUTH AT BEDTIME AS NEEDED FOR SLEEP, Disp: 30 tablet, Rfl: 1   meloxicam (MOBIC) 15 MG tablet, Take 1 tablet (15 mg total) by mouth daily as needed for pain., Disp: 90 tablet, Rfl: 1   ondansetron (ZOFRAN) 8 MG tablet, 1/2 to 1 tablet q 12 hours prn nausea, Disp: 20 tablet, Rfl: 3   SYNTHROID 100 MCG tablet, Take 1 tablet (100 mcg total) by mouth daily before breakfast., Disp: 90 tablet, Rfl: 1   valACYclovir (VALTREX) 1000 MG tablet, TAKE 1 TABLET BY MOUTH THREE TIMES DAILY AS NEEDED, Disp: 30  tablet, Rfl: 2   Josephine Igo, DO [facility] 05/10/2023 2:03 PM

## 2023-05-10 NOTE — Patient Instructions (Signed)
Thank you for visiting Dr. Tonia Brooms at [facility]. Today we recommend the following: Orders Placed This Encounter  Procedures   Procedural/ Surgical Case Request: VIDEO BRONCHOSCOPY WITH ENDOBRONCHIAL ULTRASOUND   Ambulatory referral to Pulmonology    Return in about 10 days (around 05/20/2023) for with Kandice Robinsons, NP, after Bronchoscopy.    Please do your part to reduce the spread of COVID-19.

## 2023-05-10 NOTE — Progress Notes (Signed)
Synopsis: Referred in September 2024 for adenopathy by Zola Button, Grayling Congress, *  Subjective:   PATIENT ID: Kayla Banks GENDER: female DOB: 07-Feb-1956, MRN: [id number]  Chief Complaint  Patient presents with   Establish Care    Lung Nodule    This is a 67 year old female, past medical history of breast cancer, family history of breast cancer history of chemotherapy and radiation.  Patient has a history of hypertension.  Patient hadThe patient had restaging CT imaging.  Ultimately had a CT scan that was done in the emergency department was found to have osseous metastatic disease and hilar adenopathy.  Had a nuclear medicine PET scan completed on 04/30/2023 which revealed widespread metabolic bone disease cervical thoracic lumbar spine bony rib involvement shoulders.  Had hypermetabolic right paratracheal and hilar adenopathy concern for metastatic breast.    Past Medical History:  Diagnosis Date   Breast cancer (HCC)    Complication of anesthesia    Family history of breast cancer    Heart murmur    no problems, saw cardiologist 2010   Hypertension    on meds   Hypothyroidism    Osteoarthritis    Personal history of chemotherapy    Personal history of radiation therapy    left   Plantar fasciitis    PONV (postoperative nausea and vomiting)    Thyroid disease    Hypothyroidism     Family History  Problem Relation Age of Onset   Diabetes Mother    Hyperlipidemia Mother    Hypertension Mother    Hypertension Father    Heart disease Father 21       ?MI   Hypothyroidism Sister    Hyperthyroidism Sister    Breast cancer Maternal Aunt        dx 60's/70's   Breast cancer Other        dx early 57's     Past Surgical History:  Procedure Laterality Date   ABDOMINAL HYSTERECTOMY  02/2012   AXILLARY LYMPH NODE DISSECTION Left 11/09/2018   Procedure: LEFT AXILLARY LYMPH NODE DISSECTION;  Surgeon: Harriette Bouillon, MD;  Location: [facility];  Service:  General;  Laterality: Left;   BREAST BIOPSY Left    malignant   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/11/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   BREAST LUMPECTOMY WITH RADIOACTIVE SEED AND SENTINEL LYMPH NODE BIOPSY Left 10/11/2018   Procedure: LEFT BREAST LUMPECTOMY WITH RADIOACTIVE SEED AND LEFT SENTINEL LYMPH NODE MAPPING WITH LEFT TARGETED LYMPH NODE BIOPSY;  Surgeon: Harriette Bouillon, MD;  Location: [facility];  Service: General;  Laterality: Left;   CATARACT EXTRACTION Bilateral 11/3  06/26/20   shipiro    FOOT SURGERY Left    bone spurs   LUMBAR LAMINECTOMY  1990   PORT-A-CATH REMOVAL N/A 05/23/2019   Procedure: PORT REMOVAL;  Surgeon: Harriette Bouillon, MD;  Location: [facility];  Service: General;  Laterality: N/A;   PORTACATH PLACEMENT Right 12/01/2018   Procedure: INSERTION PORT-A-CATH WITH ULTRASOUND;  Surgeon: Harriette Bouillon, MD;  Location: [facility];  Service: General;  Laterality: Right;    Social History   Socioeconomic History   Marital status: Single    Spouse name: Not on file   Number of children: Not on file   Years of education: Not on file   Highest education level: Master's degree (e.g., MA, MS, MEng, MEd, MSW, MBA)  Occupational History   Occupation: [employer]  Tobacco Use   Smoking status: Never   Smokeless tobacco: Never  Vaping Use   Vaping status: Never Used  Substance and Sexual Activity   Alcohol use: Not Currently    Comment: occassionally   Drug use: Never   Sexual activity: Not Currently    Partners: Male  Other Topics Concern   Not on file  Social History Narrative   Exercise-- no   Social Determinants of Health   Financial Resource Strain: Low Risk  (04/02/2023)   Overall Financial Resource Strain (CARDIA)    Difficulty of Paying Living Expenses: Not very hard  Food Insecurity: No Food Insecurity (04/02/2023)   Hunger Vital Sign    Worried About Running Out of Food in the Last Year:  Never true    Ran Out of Food in the Last Year: Never true  Transportation Needs: No Transportation Needs (04/02/2023)   PRAPARE - Administrator, Civil Service (Medical): No    Lack of Transportation (Non-Medical): No  Physical Activity: Unknown (04/02/2023)   Exercise Vital Sign    Days of Exercise per Week: 0 days    Minutes of Exercise per Session: Not on file  Stress: Stress Concern Present (04/02/2023)   [employer] of Occupational Health - Occupational Stress Questionnaire    Feeling of Stress : To some extent  Social Connections: Moderately Integrated (04/02/2023)   Social Connection and Isolation Panel [NHANES]    Frequency of Communication with Friends and Family: Once a week    Frequency of Social Gatherings with Friends and Family: More than three times a week    Attends Religious Services: More than 4 times per year    Active Member of [employer] or Organizations: Yes    Attends Engineer, structural: More than 4 times per year    Marital Status: Never married  Catering manager Violence: Not on file     Allergies  Allergen Reactions   Erythromycin Swelling    Lip swelling; angioedema   Lisinopril Swelling    REACTION: angioedema   Penicillins Hives and Swelling    Swelling in arms & hands   Adhesive [Tape] Rash     Outpatient Medications Prior to Visit  Medication Sig Dispense Refill   abemaciclib (VERZENIO) 100 MG tablet Take 1 tablet (100 mg total) by mouth 2 (two) times daily. 60 tablet 3   amLODipine (NORVASC) 5 MG tablet Take 1 tablet (5 mg total) by mouth daily. Needs ov before any more refills 90 tablet 1   carvedilol (COREG) 25 MG tablet Take 1 tablet (25 mg total) by mouth 2 (two) times daily with a meal. Due for appt 03/2023 180 tablet 1   EPINEPHRINE 0.3 mg/0.3 mL IJ SOAJ injection INJECT 0.3 ML(1 SYRINGE) IN THE MUSCLE AS NEEDED FOR ALLERGIC REACTION 2 Device 0   gabapentin (NEURONTIN) 300 MG capsule Take 1 capsule (300 mg total) by  mouth 2 (two) times daily. 180 capsule 1   HYDROcodone-acetaminophen (NORCO) 5-325 MG tablet Take 1-2 tablets by mouth every 4 (four) hours as needed. 60 tablet 0   letrozole (FEMARA) 2.5 MG tablet Take 1 tablet (2.5 mg total) by mouth daily. 90 tablet 3   LORazepam (ATIVAN) 0.5 MG tablet TAKE 1 TABLET(0.5 MG) BY MOUTH AT BEDTIME AS NEEDED FOR SLEEP 30 tablet 1   meloxicam (MOBIC) 15 MG tablet Take 1 tablet (15 mg total) by mouth daily as needed for pain. 90 tablet 1   ondansetron (ZOFRAN) 8 MG tablet 1/2  to 1 tablet q 12 hours prn nausea 20 tablet 3   SYNTHROID 100 MCG tablet Take 1 tablet (100 mcg total) by mouth daily before breakfast. 90 tablet 1   valACYclovir (VALTREX) 1000 MG tablet TAKE 1 TABLET BY MOUTH THREE TIMES DAILY AS NEEDED 30 tablet 2   No facility-administered medications prior to visit.    Review of Systems  Constitutional:  Negative for chills, fever, malaise/fatigue and weight loss.  HENT:  Negative for hearing loss, sore throat and tinnitus.   Eyes:  Negative for blurred vision and double vision.  Respiratory:  Negative for cough, hemoptysis, sputum production, shortness of breath, wheezing and stridor.   Cardiovascular:  Negative for chest pain, palpitations, orthopnea, leg swelling and PND.  Gastrointestinal:  Negative for abdominal pain, constipation, diarrhea, heartburn, nausea and vomiting.  Genitourinary:  Negative for dysuria, hematuria and urgency.  Musculoskeletal:  Negative for joint pain and myalgias.  Skin:  Negative for itching and rash.  Neurological:  Negative for dizziness, tingling, weakness and headaches.  Endo/Heme/Allergies:  Negative for environmental allergies. Does not bruise/bleed easily.  Psychiatric/Behavioral:  Negative for depression. The patient is not nervous/anxious and does not have insomnia.   All other systems reviewed and are negative.    Objective:  Physical Exam Vitals reviewed.  Constitutional:      General: She is not in  acute distress.    Appearance: She is well-developed. She is obese.  HENT:     Head: Normocephalic and atraumatic.  Eyes:     General: No scleral icterus.    Conjunctiva/sclera: Conjunctivae normal.     Pupils: Pupils are equal, round, and reactive to light.  Neck:     Vascular: No JVD.     Trachea: No tracheal deviation.  Cardiovascular:     Rate and Rhythm: Normal rate and regular rhythm.     Heart sounds: No murmur heard. Pulmonary:     Effort: Pulmonary effort is normal. No tachypnea, accessory muscle usage or respiratory distress.     Breath sounds: No stridor. No wheezing, rhonchi or rales.  Abdominal:     General: Bowel sounds are normal. There is no distension.     Palpations: Abdomen is soft.     Tenderness: There is no abdominal tenderness.  Musculoskeletal:        General: No tenderness.     Cervical back: Neck supple.  Lymphadenopathy:     Cervical: No cervical adenopathy.  Skin:    General: Skin is warm and dry.     Capillary Refill: Capillary refill takes less than 2 seconds.     Findings: No rash.  Neurological:     Mental Status: She is alert and oriented to person, place, and time.  Psychiatric:        Behavior: Behavior normal.      Vitals:   05/10/23 1332  BP: 124/62  Pulse: 63  SpO2: 98%  Weight: 246 lb (111.6 kg)  Height: 5\' 6"  (1.676 m)   98% on RA BMI Readings from Last 3 Encounters:  05/10/23 39.71 kg/m  05/05/23 39.71 kg/m  05/04/23 39.74 kg/m   Wt Readings from Last 3 Encounters:  05/10/23 246 lb (111.6 kg)  05/05/23 246 lb (111.6 kg)  05/04/23 246 lb 3.2 oz (111.7 kg)     CBC    Component Value Date/Time   WBC 8.8 05/06/2023 1339   WBC 6.3 04/09/2023 1413   RBC 4.33 05/06/2023 1339   HGB 12.1 05/06/2023 1339   HCT  36.8 05/06/2023 1339   PLT 225 05/06/2023 1339   MCV 85.0 05/06/2023 1339   MCH 27.9 05/06/2023 1339   MCHC 32.9 05/06/2023 1339   RDW 14.2 05/06/2023 1339   LYMPHSABS 2.1 05/06/2023 1339   MONOABS 0.5  05/06/2023 1339   EOSABS 0.3 05/06/2023 1339   BASOSABS 0.0 05/06/2023 1339     Chest Imaging: Nuclear medicine pet imaging September 2024: Hypermetabolic adenopathy within the right chest as well as bony disease concerning for stage IV malignancy. The patient's images have been independently reviewed by me.    Pulmonary Functions Testing Results:     No data to display          FeNO:   Pathology:   Echocardiogram:   Heart Catheterization:     Assessment & Plan:     ICD-10-CM   1. Adenopathy  R59.9 Procedural/ Surgical Case Request: VIDEO BRONCHOSCOPY WITH ENDOBRONCHIAL ULTRASOUND    Ambulatory referral to Pulmonology    2. Abnormal PET scan of lung  R94.2     3. History of breast cancer  Z85.3       Discussion: This is a 67 year old female, past medical history of breast cancer in 2020.  Plan: Patient now has abnormal nuclear medicine pet imaging concerning for widely metastatic osseous bone disease as well as hilar adenopathy in the right side.  Today in the office we talked with risk-benefit alternative proceed with bronchoscopy and biopsy. Patient needs this procedure done.  We talked about the risk of bleeding and pneumothorax. Due to availability of endoscopy space we will try to get this done as urgently as possible with one of my partners. Dr. Katrinka Blazing has agreed to do the patient on Thursday afternoon. We appreciate PCC's help with scheduling an endoscopy being flexible.    Current Outpatient Medications:    abemaciclib (VERZENIO) 100 MG tablet, Take 1 tablet (100 mg total) by mouth 2 (two) times daily., Disp: 60 tablet, Rfl: 3   amLODipine (NORVASC) 5 MG tablet, Take 1 tablet (5 mg total) by mouth daily. Needs ov before any more refills, Disp: 90 tablet, Rfl: 1   carvedilol (COREG) 25 MG tablet, Take 1 tablet (25 mg total) by mouth 2 (two) times daily with a meal. Due for appt 03/2023, Disp: 180 tablet, Rfl: 1   EPINEPHRINE 0.3 mg/0.3 mL IJ SOAJ  injection, INJECT 0.3 ML(1 SYRINGE) IN THE MUSCLE AS NEEDED FOR ALLERGIC REACTION, Disp: 2 Device, Rfl: 0   gabapentin (NEURONTIN) 300 MG capsule, Take 1 capsule (300 mg total) by mouth 2 (two) times daily., Disp: 180 capsule, Rfl: 1   HYDROcodone-acetaminophen (NORCO) 5-325 MG tablet, Take 1-2 tablets by mouth every 4 (four) hours as needed., Disp: 60 tablet, Rfl: 0   letrozole (FEMARA) 2.5 MG tablet, Take 1 tablet (2.5 mg total) by mouth daily., Disp: 90 tablet, Rfl: 3   LORazepam (ATIVAN) 0.5 MG tablet, TAKE 1 TABLET(0.5 MG) BY MOUTH AT BEDTIME AS NEEDED FOR SLEEP, Disp: 30 tablet, Rfl: 1   meloxicam (MOBIC) 15 MG tablet, Take 1 tablet (15 mg total) by mouth daily as needed for pain., Disp: 90 tablet, Rfl: 1   ondansetron (ZOFRAN) 8 MG tablet, 1/2 to 1 tablet q 12 hours prn nausea, Disp: 20 tablet, Rfl: 3   SYNTHROID 100 MCG tablet, Take 1 tablet (100 mcg total) by mouth daily before breakfast., Disp: 90 tablet, Rfl: 1   valACYclovir (VALTREX) 1000 MG tablet, TAKE 1 TABLET BY MOUTH THREE TIMES DAILY AS NEEDED, Disp: 30  tablet, Rfl: 2   Josephine Igo, DO [facility] 05/10/2023 2:03 PM

## 2023-05-11 ENCOUNTER — Ambulatory Visit
Admission: RE | Admit: 2023-05-11 | Discharge: 2023-05-11 | Disposition: A | Discharge status: Home / Self Care | Payer: BC Managed Care – PPO | Source: Ambulatory Visit | Attending: Radiation Oncology | Admitting: Radiation Oncology

## 2023-05-11 ENCOUNTER — Other Ambulatory Visit: Payer: Self-pay

## 2023-05-11 DIAGNOSIS — Z510 Encounter for antineoplastic radiation therapy: Secondary | ICD-10-CM | POA: Diagnosis not present

## 2023-05-11 LAB — RAD ONC ARIA SESSION SUMMARY
Course Elapsed Days: 1
Plan Fractions Treated to Date: 2
Plan Fractions Treated to Date: 2
Plan Fractions Treated to Date: 2
Plan Prescribed Dose Per Fraction: 3 Gy
Plan Prescribed Dose Per Fraction: 3 Gy
Plan Prescribed Dose Per Fraction: 3 Gy
Plan Total Fractions Prescribed: 10
Plan Total Fractions Prescribed: 10
Plan Total Fractions Prescribed: 10
Plan Total Prescribed Dose: 30 Gy
Plan Total Prescribed Dose: 30 Gy
Plan Total Prescribed Dose: 30 Gy
Reference Point Dosage Given to Date: 6 Gy
Reference Point Dosage Given to Date: 6 Gy
Reference Point Dosage Given to Date: 6 Gy
Reference Point Session Dosage Given: 3 Gy
Reference Point Session Dosage Given: 3 Gy
Reference Point Session Dosage Given: 3 Gy
Session Number: 2

## 2023-05-12 ENCOUNTER — Other Ambulatory Visit: Payer: Self-pay

## 2023-05-12 ENCOUNTER — Encounter (HOSPITAL_COMMUNITY): Payer: Self-pay | Admitting: Internal Medicine

## 2023-05-12 ENCOUNTER — Telehealth: Payer: Self-pay | Admitting: Radiation Oncology

## 2023-05-12 ENCOUNTER — Ambulatory Visit
Admission: RE | Admit: 2023-05-12 | Discharge: 2023-05-12 | Disposition: A | Discharge status: Home / Self Care | Payer: BC Managed Care – PPO | Source: Ambulatory Visit | Attending: Radiation Oncology | Admitting: Radiation Oncology

## 2023-05-12 DIAGNOSIS — Z510 Encounter for antineoplastic radiation therapy: Secondary | ICD-10-CM | POA: Diagnosis not present

## 2023-05-12 LAB — RAD ONC ARIA SESSION SUMMARY
Course Elapsed Days: 2
Plan Fractions Treated to Date: 3
Plan Fractions Treated to Date: 3
Plan Fractions Treated to Date: 3
Plan Prescribed Dose Per Fraction: 3 Gy
Plan Prescribed Dose Per Fraction: 3 Gy
Plan Prescribed Dose Per Fraction: 3 Gy
Plan Total Fractions Prescribed: 10
Plan Total Fractions Prescribed: 10
Plan Total Fractions Prescribed: 10
Plan Total Prescribed Dose: 30 Gy
Plan Total Prescribed Dose: 30 Gy
Plan Total Prescribed Dose: 30 Gy
Reference Point Dosage Given to Date: 9 Gy
Reference Point Dosage Given to Date: 9 Gy
Reference Point Dosage Given to Date: 9 Gy
Reference Point Session Dosage Given: 3 Gy
Reference Point Session Dosage Given: 3 Gy
Reference Point Session Dosage Given: 3 Gy
Session Number: 3

## 2023-05-12 NOTE — Telephone Encounter (Signed)
9/25 @ 12:42 pm Received call from patient to reschedule her treatment appointment for 9/26 due to having a procedure at 1:30 pm, if needed at the requested of her physician.  Called and spoke to [place] (Support RTT) transfer call as requested.

## 2023-05-12 NOTE — Progress Notes (Signed)
PCP - Donato Schultz, DO  PPM/ICD - denies Device Orders - n/a Rep Notified - n/a  Chest x-ray - 04-02-23 EKG - 04-02-23 Stress Test - denies ECHO - 12-12-18 Cardiac Cath - denies  CPAP - denies  DM - denies  Blood Thinner Instructions: denies Aspirin Instructions: n/a  ERAS Protcol - NPO  COVID TEST- n/a Per patient had covid in and denies any symptoms  Anesthesia review: yes hx of heart murmur, HTN, breast ca.  Patient verbally denies any shortness of breath, fever, cough and chest pain during phone call   -------------  SDW INSTRUCTIONS given:  Your procedure is scheduled on Sept. 26-2024.  Report to [facility] Main Entrance "A" at 11:00 A.M., and check in at the Admitting office.  Call this number if you have problems the morning of surgery:  [phone]   Remember:  Do not eat  or drink after midnight the night before your surgery    Take these medicines the morning of surgery with A SIP OF WATER  abemaciclib (VERZENIO)  amLODipine (NORVASC)  carvedilol (COREG)  gabapentin (NEURONTIN)  letrozole (FEMARA)  SYNTHROID   IF NEEDED EPINEPHRINE  ondansetron (ZOFRAN)   As of today, STOP taking any Aspirin (unless otherwise instructed by your surgeon) Aleve, Naproxen, Ibuprofen, Motrin, Advil, Goody's, BC's, all herbal medications, fish oil, and all vitamins. This includes your meloxicam (MOBIC).                      Do not wear jewelry, make up, or nail polish            Do not wear lotions, powders, perfumes/colognes, or deodorant.            Do not shave 48 hours prior to surgery.  Men may shave face and neck.            Do not bring valuables to the hospital.            [facility] is not responsible for any belongings or valuables.  Do NOT Smoke (Tobacco/Vaping) 24 hours prior to your procedure If you use a CPAP at night, you may bring all equipment for your overnight stay.   Contacts, glasses, dentures or bridgework may not be worn into surgery.       For patients admitted to the hospital, discharge time will be determined by your treatment team.   Patients discharged the day of surgery will not be allowed to drive home, and someone needs to stay with them for 24 hours.    Special instructions:   [facility]- Preparing For Surgery  Before surgery, you can play an important role. Because skin is not sterile, your skin needs to be as free of germs as possible. You can reduce the number of germs on your skin by washing with CHG (chlorahexidine gluconate) Soap before surgery.  CHG is an antiseptic cleaner which kills germs and bonds with the skin to continue killing germs even after washing.    Oral Hygiene is also important to reduce your risk of infection.  Remember - BRUSH YOUR TEETH THE MORNING OF SURGERY WITH YOUR REGULAR TOOTHPASTE  Please do not use if you have an allergy to CHG or antibacterial soaps. If your skin becomes reddened/irritated stop using the CHG.  Do not shave (including legs and underarms) for at least 48 hours prior to first CHG shower. It is OK to shave your face.  Please follow these instructions carefully.  Shower the NIGHT BEFORE SURGERY and the MORNING OF SURGERY with DIAL Soap.   Pat yourself dry with a CLEAN TOWEL.  Wear CLEAN PAJAMAS to bed the night before surgery  Place CLEAN SHEETS on your bed the night of your first shower and DO NOT SLEEP WITH PETS.   Day of Surgery: Please shower morning of surgery  Wear Clean/Comfortable clothing the morning of surgery Do not apply any deodorants/lotions.   Remember to brush your teeth WITH YOUR REGULAR TOOTHPASTE.   Questions were answered. Patient verbalized understanding of instructions.

## 2023-05-13 ENCOUNTER — Ambulatory Visit (HOSPITAL_COMMUNITY)
Admission: RE | Admit: 2023-05-13 | Discharge: 2023-05-13 | Disposition: A | Discharge status: Home / Self Care | Payer: BC Managed Care – PPO | Attending: Internal Medicine | Admitting: Internal Medicine

## 2023-05-13 ENCOUNTER — Other Ambulatory Visit: Payer: Self-pay

## 2023-05-13 ENCOUNTER — Ambulatory Visit (HOSPITAL_COMMUNITY): Payer: BC Managed Care – PPO | Admitting: Anesthesiology

## 2023-05-13 ENCOUNTER — Ambulatory Visit: Payer: BC Managed Care – PPO

## 2023-05-13 ENCOUNTER — Encounter (HOSPITAL_COMMUNITY)
Admission: RE | Disposition: A | Discharge status: Home / Self Care | Payer: Self-pay | Source: Home / Self Care | Attending: Internal Medicine

## 2023-05-13 ENCOUNTER — Encounter (HOSPITAL_COMMUNITY): Payer: Self-pay | Admitting: Internal Medicine

## 2023-05-13 ENCOUNTER — Other Ambulatory Visit: Payer: Self-pay | Admitting: Hematology and Oncology

## 2023-05-13 DIAGNOSIS — C7951 Secondary malignant neoplasm of bone: Secondary | ICD-10-CM | POA: Insufficient documentation

## 2023-05-13 DIAGNOSIS — R599 Enlarged lymph nodes, unspecified: Secondary | ICD-10-CM

## 2023-05-13 DIAGNOSIS — Z9221 Personal history of antineoplastic chemotherapy: Secondary | ICD-10-CM | POA: Diagnosis not present

## 2023-05-13 DIAGNOSIS — I10 Essential (primary) hypertension: Secondary | ICD-10-CM | POA: Insufficient documentation

## 2023-05-13 DIAGNOSIS — Z853 Personal history of malignant neoplasm of breast: Secondary | ICD-10-CM | POA: Insufficient documentation

## 2023-05-13 DIAGNOSIS — Z6839 Body mass index (BMI) 39.0-39.9, adult: Secondary | ICD-10-CM | POA: Insufficient documentation

## 2023-05-13 DIAGNOSIS — Z803 Family history of malignant neoplasm of breast: Secondary | ICD-10-CM | POA: Insufficient documentation

## 2023-05-13 DIAGNOSIS — Z923 Personal history of irradiation: Secondary | ICD-10-CM | POA: Diagnosis not present

## 2023-05-13 DIAGNOSIS — C801 Malignant (primary) neoplasm, unspecified: Secondary | ICD-10-CM | POA: Diagnosis not present

## 2023-05-13 DIAGNOSIS — C771 Secondary and unspecified malignant neoplasm of intrathoracic lymph nodes: Secondary | ICD-10-CM | POA: Diagnosis not present

## 2023-05-13 DIAGNOSIS — E669 Obesity, unspecified: Secondary | ICD-10-CM | POA: Diagnosis not present

## 2023-05-13 DIAGNOSIS — Z79899 Other long term (current) drug therapy: Secondary | ICD-10-CM | POA: Insufficient documentation

## 2023-05-13 HISTORY — PX: BRONCHIAL NEEDLE ASPIRATION BIOPSY: SHX5106

## 2023-05-13 HISTORY — PX: VIDEO BRONCHOSCOPY WITH ENDOBRONCHIAL ULTRASOUND: SHX6177

## 2023-05-13 SURGERY — BRONCHOSCOPY, WITH EBUS
Anesthesia: General | Laterality: Bilateral

## 2023-05-13 MED ORDER — CHLORHEXIDINE GLUCONATE 0.12 % MT SOLN: 15.0000 mL | Freq: Once | OROMUCOSAL | Status: AC

## 2023-05-13 MED ORDER — LIDOCAINE 2% (20 MG/ML) 5 ML SYRINGE
INTRAMUSCULAR | Status: DC | PRN
  Administered 2023-05-13: 60 mg via INTRAVENOUS

## 2023-05-13 MED ORDER — MIDAZOLAM HCL 2 MG/2ML IJ SOLN
INTRAMUSCULAR | Status: AC
  Filled 2023-05-13: qty 2

## 2023-05-13 MED ORDER — FENTANYL CITRATE (PF) 100 MCG/2ML IJ SOLN
INTRAMUSCULAR | Status: AC
  Filled 2023-05-13: qty 2

## 2023-05-13 MED ORDER — EPHEDRINE SULFATE-NACL 50-0.9 MG/10ML-% IV SOSY
PREFILLED_SYRINGE | INTRAVENOUS | Status: DC | PRN
  Administered 2023-05-13 (×2): 5 mg via INTRAVENOUS

## 2023-05-13 MED ORDER — DEXAMETHASONE SODIUM PHOSPHATE 10 MG/ML IJ SOLN
INTRAMUSCULAR | Status: DC | PRN
  Administered 2023-05-13: 10 mg via INTRAVENOUS

## 2023-05-13 MED ORDER — LACTATED RINGERS IV SOLN: INTRAVENOUS | Status: DC

## 2023-05-13 MED ORDER — ONDANSETRON HCL 4 MG/2ML IJ SOLN
INTRAMUSCULAR | Status: DC | PRN
Start: 2023-05-13 — End: 2023-05-13
  Administered 2023-05-13: 4 mg via INTRAVENOUS

## 2023-05-13 MED ORDER — MIDAZOLAM HCL 2 MG/2ML IJ SOLN
INTRAMUSCULAR | Status: DC | PRN
  Administered 2023-05-13: 1 mg via INTRAVENOUS

## 2023-05-13 MED ORDER — PROPOFOL 10 MG/ML IV BOLUS
INTRAVENOUS | Status: DC | PRN
Start: 2023-05-13 — End: 2023-05-13
  Administered 2023-05-13: 150 mg via INTRAVENOUS

## 2023-05-13 MED ORDER — CHLORHEXIDINE GLUCONATE 0.12 % MT SOLN
OROMUCOSAL | Status: AC
  Administered 2023-05-13: 15 mL via OROMUCOSAL
  Filled 2023-05-13: qty 15

## 2023-05-13 MED ORDER — ROCURONIUM BROMIDE 10 MG/ML (PF) SYRINGE
PREFILLED_SYRINGE | INTRAVENOUS | Status: DC | PRN
  Administered 2023-05-13: 50 mg via INTRAVENOUS

## 2023-05-13 MED ORDER — FENTANYL CITRATE (PF) 250 MCG/5ML IJ SOLN
INTRAMUSCULAR | Status: DC | PRN
  Administered 2023-05-13: 50 ug via INTRAVENOUS

## 2023-05-13 MED ORDER — PHENYLEPHRINE 80 MCG/ML (10ML) SYRINGE FOR IV PUSH (FOR BLOOD PRESSURE SUPPORT)
PREFILLED_SYRINGE | INTRAVENOUS | Status: DC | PRN
  Administered 2023-05-13 (×2): 80 ug via INTRAVENOUS

## 2023-05-13 MED ORDER — SUGAMMADEX SODIUM 200 MG/2ML IV SOLN
INTRAVENOUS | Status: DC | PRN
  Administered 2023-05-13: 200 mg via INTRAVENOUS
  Administered 2023-05-13: 250 mg via INTRAVENOUS

## 2023-05-13 MED ORDER — PROPOFOL 500 MG/50ML IV EMUL
INTRAVENOUS | Status: DC | PRN
  Administered 2023-05-13: 150 ug/kg/min via INTRAVENOUS

## 2023-05-13 SURGICAL SUPPLY — 34 items
ADAPTER VALVE BIOPSY EBUS (MISCELLANEOUS) IMPLANT
ADPTR VALVE BIOPSY EBUS (MISCELLANEOUS)
BRUSH CYTOL CELLEBRITY 1.5X140 (MISCELLANEOUS) IMPLANT
CANISTER SUCT 3000ML PPV (MISCELLANEOUS) ×2 IMPLANT
CONT SPEC 4OZ CLIKSEAL STRL BL (MISCELLANEOUS) ×2 IMPLANT
COVER BACK TABLE 60X90IN (DRAPES) ×2 IMPLANT
FORCEPS BIOP RJ4 1.8 (CUTTING FORCEPS) IMPLANT
GAUZE SPONGE 4X4 12PLY STRL (GAUZE/BANDAGES/DRESSINGS) ×2 IMPLANT
GLOVE BIO SURGEON STRL SZ7.5 (GLOVE) ×2 IMPLANT
GOWN STRL REUS W/ TWL LRG LVL3 (GOWN DISPOSABLE) ×2 IMPLANT
GOWN STRL REUS W/ TWL XL LVL3 (GOWN DISPOSABLE) ×2 IMPLANT
GOWN STRL REUS W/TWL LRG LVL3 (GOWN DISPOSABLE) ×2
GOWN STRL REUS W/TWL XL LVL3 (GOWN DISPOSABLE) ×2
KIT CLEAN ENDO COMPLIANCE (KITS) ×4 IMPLANT
KIT TURNOVER KIT B (KITS) ×2 IMPLANT
MARKER SKIN DUAL TIP RULER LAB (MISCELLANEOUS) ×2 IMPLANT
NDL ASPIRATION VIZISHOT 19G (NEEDLE) IMPLANT
NDL ASPIRATION VIZISHOT 21G (NEEDLE) IMPLANT
NEEDLE ASPIRATION VIZISHOT 19G (NEEDLE)
NEEDLE ASPIRATION VIZISHOT 21G (NEEDLE)
NS IRRIG 1000ML POUR BTL (IV SOLUTION) ×2 IMPLANT
OIL SILICONE PENTAX (PARTS (SERVICE/REPAIRS)) ×2 IMPLANT
PAD ARMBOARD 7.5X6 YLW CONV (MISCELLANEOUS) ×4 IMPLANT
SYR 20ML ECCENTRIC (SYRINGE) ×4 IMPLANT
SYR 20ML LL LF (SYRINGE) ×4 IMPLANT
SYR 50ML SLIP (SYRINGE) IMPLANT
SYR 5ML LUER SLIP (SYRINGE) ×2 IMPLANT
TOWEL GREEN STERILE FF (TOWEL DISPOSABLE) ×2 IMPLANT
TRAP SPECIMEN MUCOUS 40CC (MISCELLANEOUS) IMPLANT
TUBE CONNECTING 20X1/4 (TUBING) ×4 IMPLANT
UNDERPAD 30X30 (UNDERPADS AND DIAPERS) ×2 IMPLANT
VALVE BIOPSY SINGLE USE (MISCELLANEOUS) ×2 IMPLANT
VALVE SUCTION BRONCHIO DISP (MISCELLANEOUS) ×2 IMPLANT
WATER STERILE IRR 1000ML POUR (IV SOLUTION) ×2 IMPLANT

## 2023-05-13 NOTE — Interval H&P Note (Signed)
Seen for adenopathy. No changes to history and physical. Consents for bronchoscopy with EBUS guided biopsy after discussing risks and benefits.  Myrla Halsted MD PCCM

## 2023-05-13 NOTE — Op Note (Signed)
Flexible and EBUS Bronchoscopy Procedure Note  Kayla Banks  [id number]  1955/09/29  Date:05/13/23  Time:2:25 PM   Provider Performing:Kama Cammarano C Katrinka Blazing   Procedure: Flexible bronchoscopy and EBUS Bronchoscopy  Indication(s) Adenopathy  Consent Risks of the procedure as well as the alternatives and risks of each were explained to the patient and/or caregiver.  Consent for the procedure was obtained.  Anesthesia General Anesthesia   Time Out Verified patient identification, verified procedure, site/side was marked, verified correct patient position, special equipment/implants available, medications/allergies/relevant history reviewed, required imaging and test results available.   Sterile Technique Usual hand hygiene, masks, gowns, and gloves were used   Procedure Description The EBUS bronchoscope was advanced into airway with R hilar node biopsied and sent for slide, cell block, and/or culture.  The EBUS bronchoscope was removed after assuring no active bleeding from biopsy site.  Regular scope advanced through ETT and small amount of blood suctioned out.  Findings:  - No endobronchial lesions - Enlarged 4R node (I accidentally labeled this as 12R) biopsied   Complications/Tolerance None; patient tolerated the procedure well. Chest X-ray is not needed post procedure.   EBL Minimal   Specimen(s) 12R (actually paratracheal node)

## 2023-05-13 NOTE — Anesthesia Preprocedure Evaluation (Addendum)
Anesthesia Evaluation  Patient identified by MRN, date of birth, ID band Patient awake    Reviewed: Allergy & Precautions, NPO status , Patient's Chart, lab work & pertinent test results, reviewed documented beta blocker date and time   History of Anesthesia Complications (+) PONV and history of anesthetic complications  Airway Mallampati: II  TM Distance: >3 FB Neck ROM: Full    Dental  (+) Dental Advisory Given   Pulmonary neg pulmonary ROS   Pulmonary exam normal        Cardiovascular hypertension, Pt. on medications and Pt. on home beta blockers Normal cardiovascular exam     Neuro/Psych negative neurological ROS  negative psych ROS   GI/Hepatic negative GI ROS, Neg liver ROS,,,  Endo/Other  Hypothyroidism  Morbid obesity  Renal/GU negative Renal ROS     Musculoskeletal  (+) Arthritis , Osteoarthritis,    Abdominal  (+) + obese  Peds  Hematology negative hematology ROS (+)   Anesthesia Other Findings   Reproductive/Obstetrics  Breast cancer                               Anesthesia Physical Anesthesia Plan  ASA: 3  Anesthesia Plan: General   Post-op Pain Management: Minimal or no pain anticipated   Induction: Intravenous  PONV Risk Score and Plan: 4 or greater and Treatment may vary due to age or medical condition, Ondansetron, Dexamethasone and Midazolam  Airway Management Planned: Oral ETT  Additional Equipment: None  Intra-op Plan:   Post-operative Plan: Extubation in OR  Informed Consent: I have reviewed the patients History and Physical, chart, labs and discussed the procedure including the risks, benefits and alternatives for the proposed anesthesia with the patient or authorized representative who has indicated his/her understanding and acceptance.     Dental advisory given  Plan Discussed with: CRNA and Anesthesiologist  Anesthesia Plan Comments:          Anesthesia Quick Evaluation

## 2023-05-13 NOTE — Transfer of Care (Signed)
Immediate Anesthesia Transfer of Care Note  Patient: Kayla Banks  Procedure(s) Performed: VIDEO BRONCHOSCOPY WITH ENDOBRONCHIAL ULTRASOUND (Bilateral) BRONCHIAL NEEDLE ASPIRATION BIOPSIES  Patient Location: Endoscopy Unit  Anesthesia Type:General  Level of Consciousness: awake, alert , oriented, patient cooperative, and responds to stimulation  Airway & Oxygen Therapy: Patient Spontanous Breathing and Patient connected to face mask oxygen  Post-op Assessment: Report given to RN and Post -op Vital signs reviewed and stable  Post vital signs: Reviewed and stable  Last Vitals:  Vitals Value Taken Time  BP 124/63 05/13/23 1431  Temp    Pulse 66 05/13/23 1439  Resp 19 05/13/23 1439  SpO2 100 % 05/13/23 1439  Vitals shown include unfiled device data.   Patients Stated Pain Goal: 2 (05/13/23 1109)  Complications: No notable events documented.

## 2023-05-13 NOTE — Anesthesia Procedure Notes (Signed)
Procedure Name: Intubation Date/Time: 05/13/2023 1:45 PM  Performed by: Earlene Plater, CRNAPre-anesthesia Checklist: Patient identified, Emergency Drugs available, Suction available, Patient being monitored and Timeout performed Patient Re-evaluated:Patient Re-evaluated prior to induction Oxygen Delivery Method: Circle system utilized Preoxygenation: Pre-oxygenation with 100% oxygen Induction Type: IV induction Ventilation: Mask ventilation without difficulty Laryngoscope Size: Mac and 4 Grade View: Grade II Tube type: Oral Tube size: 8.5 (As requested) mm Number of attempts: 1 Airway Equipment and Method: Stylet and Bite block Placement Confirmation: ETT inserted through vocal cords under direct vision, positive ETCO2 and CO2 detector Secured at: 21 (@ lips) cm Tube secured with: Tape Dental Injury: Teeth and Oropharynx as per pre-operative assessment

## 2023-05-14 ENCOUNTER — Ambulatory Visit: Payer: BC Managed Care – PPO

## 2023-05-14 ENCOUNTER — Telehealth: Payer: Self-pay | Admitting: Radiation Oncology

## 2023-05-14 ENCOUNTER — Telehealth: Payer: Self-pay | Admitting: Internal Medicine

## 2023-05-14 NOTE — Telephone Encounter (Signed)
05/14/2023 Called and informed of prelim results from bronch. Has appt 05/17/23 with onc to discuss further.  Myrla Halsted MD PCCM

## 2023-05-14 NOTE — Telephone Encounter (Signed)
Pt called about r/s today's tx appt. Pt states her regular transportation is not available to her today. Call transferred to RTT support.

## 2023-05-16 ENCOUNTER — Encounter (HOSPITAL_COMMUNITY): Payer: Self-pay | Admitting: Internal Medicine

## 2023-05-16 NOTE — Anesthesia Postprocedure Evaluation (Signed)
Anesthesia Post Note  Patient: Academic librarian  Procedure(s) Performed: VIDEO BRONCHOSCOPY WITH ENDOBRONCHIAL ULTRASOUND (Bilateral) BRONCHIAL NEEDLE ASPIRATION BIOPSIES     Patient location during evaluation: PACU Anesthesia Type: General Level of consciousness: awake and alert Pain management: pain level controlled Vital Signs Assessment: post-procedure vital signs reviewed and stable Respiratory status: spontaneous breathing, nonlabored ventilation and respiratory function stable Cardiovascular status: stable and blood pressure returned to baseline Anesthetic complications: no   No notable events documented.  Last Vitals:  Vitals:   05/13/23 1440 05/13/23 1450  BP: (!) 109/59 130/62  Pulse: 71 66  Resp: 18 11  Temp:    SpO2: 97% 93%    Last Pain:  Vitals:   05/13/23 1450  TempSrc:   PainSc: 0-No pain                 Beryle Lathe

## 2023-05-17 ENCOUNTER — Inpatient Hospital Stay: Payer: BC Managed Care – PPO | Admitting: Pharmacist

## 2023-05-17 ENCOUNTER — Inpatient Hospital Stay: Payer: BC Managed Care – PPO

## 2023-05-17 ENCOUNTER — Ambulatory Visit
Admission: RE | Admit: 2023-05-17 | Discharge: 2023-05-17 | Disposition: A | Discharge status: Home / Self Care | Payer: BC Managed Care – PPO | Source: Ambulatory Visit | Attending: Radiation Oncology

## 2023-05-17 ENCOUNTER — Other Ambulatory Visit: Payer: Self-pay

## 2023-05-17 VITALS — BP 119/60 | HR 57 | Temp 98.1°F | Resp 16 | Wt 242.1 lb

## 2023-05-17 DIAGNOSIS — Z95828 Presence of other vascular implants and grafts: Secondary | ICD-10-CM

## 2023-05-17 DIAGNOSIS — Z170 Estrogen receptor positive status [ER+]: Secondary | ICD-10-CM

## 2023-05-17 DIAGNOSIS — Z510 Encounter for antineoplastic radiation therapy: Secondary | ICD-10-CM | POA: Diagnosis not present

## 2023-05-17 LAB — RAD ONC ARIA SESSION SUMMARY
Course Elapsed Days: 7
Plan Fractions Treated to Date: 4
Plan Fractions Treated to Date: 4
Plan Fractions Treated to Date: 4
Plan Prescribed Dose Per Fraction: 3 Gy
Plan Prescribed Dose Per Fraction: 3 Gy
Plan Prescribed Dose Per Fraction: 3 Gy
Plan Total Fractions Prescribed: 10
Plan Total Fractions Prescribed: 10
Plan Total Fractions Prescribed: 10
Plan Total Prescribed Dose: 30 Gy
Plan Total Prescribed Dose: 30 Gy
Plan Total Prescribed Dose: 30 Gy
Reference Point Dosage Given to Date: 12 Gy
Reference Point Dosage Given to Date: 12 Gy
Reference Point Dosage Given to Date: 12 Gy
Reference Point Session Dosage Given: 3 Gy
Reference Point Session Dosage Given: 3 Gy
Reference Point Session Dosage Given: 3 Gy
Session Number: 4

## 2023-05-17 LAB — CMP (CANCER CENTER ONLY)
ALT: 11 U/L (ref 0–44)
AST: 18 U/L (ref 15–41)
Albumin: 3.7 g/dL (ref 3.5–5.0)
Alkaline Phosphatase: 128 U/L — ABNORMAL HIGH (ref 38–126)
Anion gap: 7 (ref 5–15)
BUN: 28 mg/dL — ABNORMAL HIGH (ref 8–23)
CO2: 30 mmol/L (ref 22–32)
Calcium: 10.3 mg/dL (ref 8.9–10.3)
Chloride: 105 mmol/L (ref 98–111)
Creatinine: 1.48 mg/dL — ABNORMAL HIGH (ref 0.44–1.00)
GFR, Estimated: 39 mL/min — ABNORMAL LOW (ref >60)
Glucose, Bld: 113 mg/dL — ABNORMAL HIGH (ref 70–99)
Potassium: 4 mmol/L (ref 3.5–5.1)
Sodium: 142 mmol/L (ref 135–145)
Total Bilirubin: 1.1 mg/dL (ref 0.3–1.2)
Total Protein: 7.4 g/dL (ref 6.5–8.1)

## 2023-05-17 LAB — CBC WITH DIFFERENTIAL (CANCER CENTER ONLY)
Abs Immature Granulocytes: 0.06 10*3/uL (ref 0.00–0.07)
Basophils Absolute: 0 10*3/uL (ref 0.0–0.1)
Basophils Relative: 0 %
Eosinophils Absolute: 0.1 10*3/uL (ref 0.0–0.5)
Eosinophils Relative: 1 %
HCT: 38.3 % (ref 36.0–46.0)
Hemoglobin: 12.4 g/dL (ref 12.0–15.0)
Immature Granulocytes: 1 %
Lymphocytes Relative: 8 %
Lymphs Abs: 0.7 10*3/uL (ref 0.7–4.0)
MCH: 27.9 pg (ref 26.0–34.0)
MCHC: 32.4 g/dL (ref 30.0–36.0)
MCV: 86.1 fL (ref 80.0–100.0)
Monocytes Absolute: 0.2 10*3/uL (ref 0.1–1.0)
Monocytes Relative: 3 %
Neutro Abs: 6.7 10*3/uL (ref 1.7–7.7)
Neutrophils Relative %: 87 %
Platelet Count: 245 10*3/uL (ref 150–400)
RBC: 4.45 MIL/uL (ref 3.87–5.11)
RDW: 14.7 % (ref 11.5–15.5)
WBC Count: 7.8 10*3/uL (ref 4.0–10.5)
nRBC: 0 % (ref 0.0–0.2)

## 2023-05-17 MED ORDER — DENOSUMAB 120 MG/1.7ML ~~LOC~~ SOLN
120.0000 mg | Freq: Once | SUBCUTANEOUS | Status: AC
  Administered 2023-05-17: 120 mg via SUBCUTANEOUS
  Filled 2023-05-17: qty 1.7

## 2023-05-17 NOTE — Patient Instructions (Signed)
Denosumab Injection (Oncology) What is this medication? DENOSUMAB (den oh SUE mab) prevents weakened bones caused by cancer. It may also be used to treat noncancerous bone tumors that cannot be removed by surgery. It can also be used to treat high calcium levels in the blood caused by cancer. It works by blocking a protein that causes bones to break down quickly. This slows down the release of calcium from bones, which lowers calcium levels in your blood. It also makes your bones stronger and less likely to break (fracture). This medicine may be used for other purposes; ask your health care provider or pharmacist if you have questions. COMMON BRAND NAME(S): XGEVA What should I tell my care team before I take this medication? They need to know if you have any of these conditions: Dental disease Having surgery or tooth extraction Infection Kidney disease Low levels of calcium or vitamin D in the blood Malnutrition On hemodialysis Skin conditions or sensitivity Thyroid or parathyroid disease An unusual reaction to denosumab, other medications, foods, dyes, or preservatives Pregnant or trying to get pregnant Breast-feeding How should I use this medication? This medication is for injection under the skin. It is given by your care team in a hospital or clinic setting. A special MedGuide will be given to you before each treatment. Be sure to read this information carefully each time. Talk to your care team about the use of this medication in children. While it may be prescribed for children as young as 13 years for selected conditions, precautions do apply. Overdosage: If you think you have taken too much of this medicine contact a poison control center or emergency room at once. NOTE: This medicine is only for you. Do not share this medicine with others. What if I miss a dose? Keep appointments for follow-up doses. It is important not to miss your dose. Call your care team if you are unable to  keep an appointment. What may interact with this medication? Do not take this medication with any of the following: Other medications containing denosumab This medication may also interact with the following: Medications that lower your chance of fighting infection Steroid medications, such as prednisone or cortisone This list may not describe all possible interactions. Give your health care provider a list of all the medicines, herbs, non-prescription drugs, or dietary supplements you use. Also tell them if you smoke, drink alcohol, or use illegal drugs. Some items may interact with your medicine. What should I watch for while using this medication? Your condition will be monitored carefully while you are receiving this medication. You may need blood work while taking this medication. This medication may increase your risk of getting an infection. Call your care team for advice if you get a fever, chills, sore throat, or other symptoms of a cold or flu. Do not treat yourself. Try to avoid being around people who are sick. You should make sure you get enough calcium and vitamin D while you are taking this medication, unless your care team tells you not to. Discuss the foods you eat and the vitamins you take with your care team. Some people who take this medication have severe bone, joint, or muscle pain. This medication may also increase your risk for jaw problems or a broken thigh bone. Tell your care team right away if you have severe pain in your jaw, bones, joints, or muscles. Tell your care team if you have any pain that does not go away or that gets worse. Talk   to your care team if you may be pregnant. Serious birth defects can occur if you take this medication during pregnancy and for 5 months after the last dose. You will need a negative pregnancy test before starting this medication. Contraception is recommended while taking this medication and for 5 months after the last dose. Your care team  can help you find the option that works for you. What side effects may I notice from receiving this medication? Side effects that you should report to your care team as soon as possible: Allergic reactions--skin rash, itching, hives, swelling of the face, lips, tongue, or throat Bone, joint, or muscle pain Low calcium level--muscle pain or cramps, confusion, tingling, or numbness in the hands or feet Osteonecrosis of the jaw--pain, swelling, or redness in the mouth, numbness of the jaw, poor healing after dental work, unusual discharge from the mouth, visible bones in the mouth Side effects that usually do not require medical attention (report to your care team if they continue or are bothersome): Cough Diarrhea Fatigue Headache Nausea This list may not describe all possible side effects. Call your doctor for medical advice about side effects. You may report side effects to FDA at 1-800-FDA-1088. Where should I keep my medication? This medication is given in a hospital or clinic. It will not be stored at home. NOTE: This sheet is a summary. It may not cover all possible information. If you have questions about this medicine, talk to your doctor, pharmacist, or health care provider.  2024 Elsevier/Gold Standard (2021-12-24 00:00:00)  

## 2023-05-17 NOTE — Progress Notes (Signed)
[facility]       Telephone: [phone]?Fax: [phone]   Oncology Clinical Pharmacist Practitioner Initial Assessment  Kayla Banks is a 67 y.o. female with a diagnosis of metastatic breast cancer. They were contacted today via in-person visit.  Indication/Regimen Abemaciclib (Verzenio) is being used appropriately for treatment of metastatic breast cancer by Dr. Serena Croissant.      Wt Readings from Last 1 Encounters:  05/17/23 242 lb 1.6 oz (109.8 kg)    Estimated body surface area is 2.26 meters squared as calculated from the following:   Height as of 05/13/23: 5\' 6"  (1.676 m).   Weight as of this encounter: 242 lb 1.6 oz (109.8 kg).  The dosing regimen is 100 mg by mouth every 12 hours on days 1 to 28 of a 28-day cycle. This is being given  in combination with letrozole which was started on 05/04/23 and denosumab 120 mg [name]) which is being started today. Dr. Pamelia Hoit has cleared her for treatment . Her treatment regimen is planned to continue until disease progression or unacceptable toxicity. [name] was seen today by clinical pharmacy to establish care for her abemaciclib management after being referred by Dr. Pamelia Hoit.  She was last seen by Dr. Pamelia Hoit on 05/04/23.  At that time, her calcium was elevated and so calcium vitamin D was not started today prior to starting denosumab 120 mg.  Today it is WNL and we will continue to monitor this and likely start vitamin D/calcium at her next visit.  We did review the importance of drinking plenty of fluids as her serum creatinine has been elevated. She is starting denosumab 120 mg today and will receive every 12 weeks.  She started abemaciclib on 05/10/23 and so far is just reporting mild nausea.  She is not taking ondansetron for this but she does have it at home and we did recommend taking this as soon as she starts feeling nauseous.  She has not thrown up.  She was not interested in taking any ondansetron today prior  to her denosumab injection.  Since she has been on abemaciclib for a week, we will next have her see Dr. Pamelia Hoit with labs at the 4-week mark which will be 3 weeks from today.  We will see her again with labs at the 6-week mark, approximately 5 weeks from today.  We discussed that we will continue to get every 2-week labs for 2 months, followed by monthly labs for 2 months and then we will likely get labs with visits every 3 months to coincide with her denosumab 120 mg injections.  She will next be due for denosumab on 08/09/23.    Dose Modifications Dr. Pamelia Hoit is prescribing a reduced dose of abemaciclib 100 mg every 12 hours  Access Assessment Lamar Naef will be receiving abemaciclib through [facility] Concerns: none Start date if known: 05/10/23  Allergies Allergies  Allergen Reactions   Erythromycin Swelling    Lip swelling; angioedema   Lisinopril Swelling    REACTION: angioedema   Penicillins Hives and Swelling    Swelling in arms & hands   Adhesive [Tape] Rash    Vitals    05/17/2023   10:03 AM 05/13/2023    2:50 PM 05/13/2023    2:40 PM  Oncology Vitals  Weight 109.816 kg    Weight (lbs) 242 lbs 2 oz    BMI 39.08 kg/m2    Temp 98.1 F (36.7 C)    Pulse  Rate 57 66 71  BP 119/60 130/62 109/59  Resp 16 11 18   SpO2 100 % 93 % 97 %  BSA (m2) 2.26 m2       Laboratory Data    Latest Ref Rng & Units 05/17/2023    9:03 AM 05/06/2023    1:39 PM 04/09/2023    2:13 PM  CBC EXTENDED  WBC 4.0 - 10.5 K/uL 7.8  8.8  6.3   RBC 3.87 - 5.11 MIL/uL 4.45  4.33  5.29   Hemoglobin 12.0 - 15.0 g/dL 16.1  09.6  04.5   HCT 36.0 - 46.0 % 38.3  36.8  44.4   Platelets 150 - 400 K/uL 245  225  223   NEUT# 1.7 - 7.7 K/uL 6.7  5.9  4,076   Lymph# 0.7 - 4.0 K/uL 0.7  2.1  1,625        Latest Ref Rng & Units 05/17/2023    9:03 AM 05/06/2023    1:39 PM 04/09/2023    2:13 PM  CMP  Glucose 70 - 99 mg/dL 409  811  94   BUN 8 - 23 mg/dL 28  25  22     Creatinine 0.44 - 1.00 mg/dL 9.14  7.82  9.56   Sodium 135 - 145 mmol/L 142  144  143   Potassium 3.5 - 5.1 mmol/L 4.0  3.7  4.3   Chloride 98 - 111 mmol/L 105  103  102   CO2 22 - 32 mmol/L 30  34  30   Calcium 8.9 - 10.3 mg/dL 21.3  08.6  57.8   Total Protein 6.5 - 8.1 g/dL 7.4  7.3  7.3   Total Bilirubin 0.3 - 1.2 mg/dL 1.1  0.7  0.7   Alkaline Phos 38 - 126 U/L 128  126    AST 15 - 41 U/L 18  25  22    ALT 0 - 44 U/L 11  21  19     Contraindications Contraindications were reviewed? Yes Contraindications to therapy were identified? No   Safety Precautions The following safety precautions for the use of abemaciclib were reviewed:  Changes in kidney function: importance of drinking plenty of fluids and monitoring urine output Diarrhea: we reviewed that diarrhea is common with abemaciclib and confirmed that she does have loperamide (Imodium) at home.  We reviewed how to take this medication PRN and gave her information on abemaciclib Decreased white blood cells (WBCs) and increased risk for infection: we discussed the importance of having a thermometer and what the Centers for Disease Control and Prevention (CDC) considers a fever which is 100.78F (38C) or higher.  Gave patient 24/7 triage line to call if any fevers or symptoms Decreased hemoglobin, part of red blood cells that carry iron and oxygen Fatigue Nausea and Vomiting Hepatotoxicity: reviewed to contact clinic for RUQ pain that will not subside, yellowing of eyes/skin Decreased appetite or weight loss Abdominal pain Decreased platelet count and increased risk for bleeding Venous thromboembolism (VTE): reviewed signs of deep vein thrombosis (DVT) such as leg swelling, redness, pain, or tenderness and signs of pulmonary embolism (PE) such as shortness of breath, rapid or irregular heartbeat, cough, chest pain, or lightheadedness ILD/Pneumonitis: we reviewed potential symptoms including cough, shortness, and fatigue. Handling  body fluids and waste Pregnancy, sexual activity, and contraception Reviewed to take the medication every 12 hours (with food sometimes can be easier on the stomach) and to take it at the same time every day. Discussed proper storage  and handling of abemaciclib  Medication Reconciliation Current Outpatient Medications  Medication Sig Dispense Refill   abemaciclib (VERZENIO) 100 MG tablet Take 1 tablet (100 mg total) by mouth 2 (two) times daily. 60 tablet 3   amLODipine (NORVASC) 5 MG tablet Take 1 tablet (5 mg total) by mouth daily. Needs ov before any more refills 90 tablet 1   carvedilol (COREG) 25 MG tablet Take 1 tablet (25 mg total) by mouth 2 (two) times daily with a meal. Due for appt 03/2023 180 tablet 1   gabapentin (NEURONTIN) 300 MG capsule Take 1 capsule (300 mg total) by mouth 2 (two) times daily. (Patient taking differently: Take 300 mg by mouth 2 (two) times daily.) 180 capsule 1   HYDROcodone-acetaminophen (NORCO) 5-325 MG tablet Take 1-2 tablets by mouth every 4 (four) hours as needed. 60 tablet 0   letrozole (FEMARA) 2.5 MG tablet Take 1 tablet (2.5 mg total) by mouth daily. 90 tablet 3   meloxicam (MOBIC) 15 MG tablet Take 1 tablet (15 mg total) by mouth daily as needed for pain. (Patient taking differently: Take 15 mg by mouth daily as needed for pain.) 90 tablet 1   SYNTHROID 100 MCG tablet Take 1 tablet (100 mcg total) by mouth daily before breakfast. 90 tablet 1   valACYclovir (VALTREX) 1000 MG tablet TAKE 1 TABLET BY MOUTH THREE TIMES DAILY AS NEEDED (Patient taking differently: Take 1,000 mg by mouth as needed. Takes 1 tablet by mouth daily as needed for cold sores) 30 tablet 2   EPINEPHRINE 0.3 mg/0.3 mL IJ SOAJ injection INJECT 0.3 ML(1 SYRINGE) IN THE MUSCLE AS NEEDED FOR ALLERGIC REACTION (Patient not taking: Reported on 05/17/2023) 2 Device 0   ondansetron (ZOFRAN) 8 MG tablet 1/2 to 1 tablet q 12 hours prn nausea (Patient not taking: Reported on 05/17/2023) 20 tablet 3    No current facility-administered medications for this visit.   Medication reconciliation is based on the patient's most recent medication list in the electronic medical record (EMR) including herbal products and OTC medications.   The patient's medication list was reviewed today with the patient? Yes   Drug-drug interactions (DDIs) DDIs were evaluated? Yes Significant DDIs identified? No   Drug-Food Interactions Drug-food interactions were evaluated? Yes Drug-food interactions identified?  She will avoid grapefruit products  Follow-up Plan  Continue abemaciclib 100 mg by mouth every 12 hours Start denosumab 120 mg subcutaneously every 12 weeks.  She will start this today.  Dr. Pamelia Hoit has cleared her from a dental clearance perspective. Next due 08/09/23 Continue letrozole 2.5 mg by mouth daily Monitor calcium levels.  Elevated at last visit, WNL today.  Likely start calcium/vitamin D supplement at next visit. Monitor serum creatinine.  Discussed the importance of drinking plenty of fluids while on abemaciclib. Monitor constipation. Gave her diet options and OTC options today. Written handout also given Continue to follow with her PCP Dr Seabron Spates for BP management. Sees every 6 months. Restaging scans to be ordered for ~08/09/23. Will add labs, Dr Pamelia Hoit visit on 06/07/23 Will add labs, Pharmacy clinic visit on 06/21/23  Abisola Carrero participated in the discussion, expressed understanding, and voiced agreement with the above plan. All questions were answered to her satisfaction. The patient was advised to contact the clinic at [phone] with any questions or concerns prior to her return visit.   I spent 60 minutes assessing the patient.  Anoop Hemmer A. Odetta Pink, PharmD, BCOP, CPP  Anselm Lis, RPH-CPP, 05/17/2023 10:34 AM  **  Disclaimer: This note was dictated with voice recognition software. Similar sounding words can inadvertently be transcribed and this note may  contain transcription errors which may not have been corrected upon publication of note.**

## 2023-05-18 ENCOUNTER — Other Ambulatory Visit: Payer: Self-pay

## 2023-05-18 ENCOUNTER — Telehealth: Payer: Self-pay | Admitting: Radiation Oncology

## 2023-05-18 ENCOUNTER — Ambulatory Visit: Payer: BC Managed Care – PPO

## 2023-05-18 ENCOUNTER — Ambulatory Visit
Admission: RE | Admit: 2023-05-18 | Discharge: 2023-05-18 | Disposition: A | Discharge status: Home / Self Care | Payer: BC Managed Care – PPO | Source: Ambulatory Visit | Attending: Radiation Oncology | Admitting: Radiation Oncology

## 2023-05-18 DIAGNOSIS — Z170 Estrogen receptor positive status [ER+]: Secondary | ICD-10-CM | POA: Diagnosis not present

## 2023-05-18 DIAGNOSIS — C50412 Malignant neoplasm of upper-outer quadrant of left female breast: Secondary | ICD-10-CM | POA: Insufficient documentation

## 2023-05-18 DIAGNOSIS — C7951 Secondary malignant neoplasm of bone: Secondary | ICD-10-CM | POA: Diagnosis present

## 2023-05-18 DIAGNOSIS — Z510 Encounter for antineoplastic radiation therapy: Secondary | ICD-10-CM | POA: Insufficient documentation

## 2023-05-18 LAB — RAD ONC ARIA SESSION SUMMARY
Course Elapsed Days: 8
Plan Fractions Treated to Date: 5
Plan Fractions Treated to Date: 5
Plan Fractions Treated to Date: 5
Plan Prescribed Dose Per Fraction: 3 Gy
Plan Prescribed Dose Per Fraction: 3 Gy
Plan Prescribed Dose Per Fraction: 3 Gy
Plan Total Fractions Prescribed: 10
Plan Total Fractions Prescribed: 10
Plan Total Fractions Prescribed: 10
Plan Total Prescribed Dose: 30 Gy
Plan Total Prescribed Dose: 30 Gy
Plan Total Prescribed Dose: 30 Gy
Reference Point Dosage Given to Date: 15 Gy
Reference Point Dosage Given to Date: 15 Gy
Reference Point Dosage Given to Date: 15 Gy
Reference Point Session Dosage Given: 3 Gy
Reference Point Session Dosage Given: 3 Gy
Reference Point Session Dosage Given: 3 Gy
Session Number: 5

## 2023-05-18 NOTE — Telephone Encounter (Signed)
10/1 @ 9:07 am patient called to confirmed her treatment appointment appointment time for today.  She is aware to be here by 4:15 pm according to [facility].

## 2023-05-19 ENCOUNTER — Ambulatory Visit
Admission: RE | Admit: 2023-05-19 | Discharge: 2023-05-19 | Discharge status: Home / Self Care | Payer: BC Managed Care – PPO | Source: Ambulatory Visit | Attending: Radiation Oncology

## 2023-05-19 ENCOUNTER — Other Ambulatory Visit: Payer: Self-pay

## 2023-05-19 DIAGNOSIS — C7951 Secondary malignant neoplasm of bone: Secondary | ICD-10-CM | POA: Diagnosis not present

## 2023-05-19 LAB — RAD ONC ARIA SESSION SUMMARY
Course Elapsed Days: 9
Plan Fractions Treated to Date: 6
Plan Fractions Treated to Date: 6
Plan Fractions Treated to Date: 6
Plan Prescribed Dose Per Fraction: 3 Gy
Plan Prescribed Dose Per Fraction: 3 Gy
Plan Prescribed Dose Per Fraction: 3 Gy
Plan Total Fractions Prescribed: 10
Plan Total Fractions Prescribed: 10
Plan Total Fractions Prescribed: 10
Plan Total Prescribed Dose: 30 Gy
Plan Total Prescribed Dose: 30 Gy
Plan Total Prescribed Dose: 30 Gy
Reference Point Dosage Given to Date: 18 Gy
Reference Point Dosage Given to Date: 18 Gy
Reference Point Dosage Given to Date: 18 Gy
Reference Point Session Dosage Given: 3 Gy
Reference Point Session Dosage Given: 3 Gy
Reference Point Session Dosage Given: 3 Gy
Session Number: 6

## 2023-05-20 ENCOUNTER — Other Ambulatory Visit: Payer: Self-pay | Admitting: Radiation Oncology

## 2023-05-20 ENCOUNTER — Other Ambulatory Visit: Payer: Self-pay

## 2023-05-20 ENCOUNTER — Ambulatory Visit: Payer: BC Managed Care – PPO

## 2023-05-20 ENCOUNTER — Encounter (HOSPITAL_COMMUNITY): Payer: Self-pay

## 2023-05-20 ENCOUNTER — Ambulatory Visit (HOSPITAL_COMMUNITY): Admission: RE | Admit: 2023-05-20 | Payer: BC Managed Care – PPO | Source: Ambulatory Visit

## 2023-05-20 ENCOUNTER — Emergency Department (HOSPITAL_COMMUNITY)
Admission: EM | Admit: 2023-05-20 | Discharge: 2023-05-20 | Discharge status: Against Medical Advice | Payer: BC Managed Care – PPO | Attending: Emergency Medicine | Admitting: Emergency Medicine

## 2023-05-20 ENCOUNTER — Encounter: Payer: Self-pay | Admitting: Hematology and Oncology

## 2023-05-20 ENCOUNTER — Telehealth: Payer: Self-pay | Admitting: *Deleted

## 2023-05-20 DIAGNOSIS — R29898 Other symptoms and signs involving the musculoskeletal system: Secondary | ICD-10-CM | POA: Insufficient documentation

## 2023-05-20 DIAGNOSIS — R200 Anesthesia of skin: Secondary | ICD-10-CM | POA: Insufficient documentation

## 2023-05-20 DIAGNOSIS — Z5321 Procedure and treatment not carried out due to patient leaving prior to being seen by health care provider: Secondary | ICD-10-CM | POA: Diagnosis not present

## 2023-05-20 LAB — CBC WITH DIFFERENTIAL/PLATELET
Abs Immature Granulocytes: 0.05 10*3/uL (ref 0.00–0.07)
Basophils Absolute: 0 10*3/uL (ref 0.0–0.1)
Basophils Relative: 0 %
Eosinophils Absolute: 0.1 10*3/uL (ref 0.0–0.5)
Eosinophils Relative: 1 %
HCT: 36.5 % (ref 36.0–46.0)
Hemoglobin: 11.4 g/dL — ABNORMAL LOW (ref 12.0–15.0)
Immature Granulocytes: 1 %
Lymphocytes Relative: 7 %
Lymphs Abs: 0.3 10*3/uL — ABNORMAL LOW (ref 0.7–4.0)
MCH: 28.3 pg (ref 26.0–34.0)
MCHC: 31.2 g/dL (ref 30.0–36.0)
MCV: 90.6 fL (ref 80.0–100.0)
Monocytes Absolute: 0.2 10*3/uL (ref 0.1–1.0)
Monocytes Relative: 3 %
Neutro Abs: 4.2 10*3/uL (ref 1.7–7.7)
Neutrophils Relative %: 88 %
Platelets: 201 10*3/uL (ref 150–400)
RBC: 4.03 MIL/uL (ref 3.87–5.11)
RDW: 15.3 % (ref 11.5–15.5)
WBC: 4.9 10*3/uL (ref 4.0–10.5)
nRBC: 0 % (ref 0.0–0.2)

## 2023-05-20 LAB — BASIC METABOLIC PANEL
Anion gap: 7 (ref 5–15)
BUN: 19 mg/dL (ref 8–23)
CO2: 26 mmol/L (ref 22–32)
Calcium: 8.3 mg/dL — ABNORMAL LOW (ref 8.9–10.3)
Chloride: 108 mmol/L (ref 98–111)
Creatinine, Ser: 1.01 mg/dL — ABNORMAL HIGH (ref 0.44–1.00)
GFR, Estimated: >60 mL/min (ref >60)
Glucose, Bld: 111 mg/dL — ABNORMAL HIGH (ref 70–99)
Potassium: 4 mmol/L (ref 3.5–5.1)
Sodium: 141 mmol/L (ref 135–145)

## 2023-05-20 MED ORDER — DEXAMETHASONE 4 MG PO TABS: 4.0000 mg | ORAL_TABLET | Freq: Two times a day (BID) | ORAL | 1 refills | Status: DC

## 2023-05-20 NOTE — ED Provider Triage Note (Signed)
Emergency Medicine Provider Triage Evaluation Note  Kayla Banks , a 67 y.o. female  was evaluated in triage.  Pt complains of foot weakness. Patient has left foot weakness and numbness since receiving radiation. She was sent by rad-onc for imaging of her spine to rule out cord compression  Review of Systems  Positive: Left foot weakness Negative: Saddle anesthesia  Physical Exam  BP 128/65 (BP Location: Right Arm)   Pulse 74   Temp 98.9 F (37.2 C) (Oral)   Resp 17   LMP 01/28/2012   SpO2 100%  Gen:   Awake, no distress   Resp:  Normal effort  MSK:   Moves extremities without difficulty  Other:   Medical Decision Making  Medically screening exam initiated at 6:35 PM.  Appropriate orders placed.  Kayla Banks was informed that the remainder of the evaluation will be completed by another provider, this initial triage assessment does not replace that evaluation, and the importance of remaining in the ED until their evaluation is complete.    Maxwell Marion, PA-C 05/20/23 [phone]

## 2023-05-20 NOTE — Progress Notes (Signed)
I called the patient and she describes increasing pelvic pain especially in the left pelvis.  In the last week, she states that she has been feeling like she cannot control her steps on the left side when she walks and is becoming increasingly difficult to model eyes.  She denies any loss of control of bowel or bladder activity or saddle anesthesia.  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  We discussed the importance of proceeding with emergent MRI scan to rule out cord compression as she has widespread metastatic breast cancer and is currently undergoing radiation for this.  We discussed the use of dexamethasone 4 mg twice daily which was sent into her pharmacy however I feel the safest place for her to be worked up for this with a stat MRI of the lumbar spine would be to come to the emergency room for evaluation and likely admission.  She is uncertain if she will be able to do this as she relies on other family members for transportation and wants to discuss this with her sister and mother.  I explained the concerns that if she does have spinal cord compression she could become paralyzed from this in an acute timeframe.  She will call us back to tell us how she would like to proceed.  If she is willing to go to the emergency room I would attempt to call the charge nurse to try and facilitate a quick workup for her.  She is in agreement to keep [place] informed of her decision making.

## 2023-05-20 NOTE — ED Triage Notes (Signed)
Pts receiving radiation therapy to her spine. Pt experiencing weakness in her left foot that has been going on for apprx 2 wks.  Pts radiologist (Dr. Mitzi Hansen) wanted pt seen, and a MRI performed on her spine over concerns of damage to that area related to the radiation therapy.

## 2023-05-20 NOTE — Telephone Encounter (Signed)
Spoke with the patient regarding a mychart message she sent.  She states she continues to have pain in her mid torso.  She reports since starting her radiation treatments she noticed that her left leg and foot feel worse.  She is having pain, weakness and heaviness in those areas.  Her concerns were reported to the PA-C.  I let her know that we would be in contact with more information.  Lind Covert RN, BSN

## 2023-05-21 ENCOUNTER — Other Ambulatory Visit: Payer: Self-pay

## 2023-05-21 ENCOUNTER — Ambulatory Visit: Payer: BC Managed Care – PPO

## 2023-05-21 ENCOUNTER — Encounter (HOSPITAL_COMMUNITY): Payer: Self-pay

## 2023-05-21 ENCOUNTER — Emergency Department (HOSPITAL_COMMUNITY): Payer: BC Managed Care – PPO

## 2023-05-21 ENCOUNTER — Emergency Department (HOSPITAL_COMMUNITY)
Admission: EM | Admit: 2023-05-21 | Discharge: 2023-05-21 | Disposition: A | Discharge status: Home / Self Care | Payer: BC Managed Care – PPO | Attending: Emergency Medicine | Admitting: Emergency Medicine

## 2023-05-21 DIAGNOSIS — Z853 Personal history of malignant neoplasm of breast: Secondary | ICD-10-CM | POA: Insufficient documentation

## 2023-05-21 DIAGNOSIS — C7951 Secondary malignant neoplasm of bone: Secondary | ICD-10-CM

## 2023-05-21 DIAGNOSIS — Z79899 Other long term (current) drug therapy: Secondary | ICD-10-CM | POA: Insufficient documentation

## 2023-05-21 DIAGNOSIS — I10 Essential (primary) hypertension: Secondary | ICD-10-CM | POA: Insufficient documentation

## 2023-05-21 DIAGNOSIS — E039 Hypothyroidism, unspecified: Secondary | ICD-10-CM | POA: Diagnosis not present

## 2023-05-21 DIAGNOSIS — D649 Anemia, unspecified: Secondary | ICD-10-CM

## 2023-05-21 DIAGNOSIS — T886XXA Anaphylactic reaction due to adverse effect of correct drug or medicament properly administered, initial encounter: Secondary | ICD-10-CM | POA: Diagnosis not present

## 2023-05-21 DIAGNOSIS — R531 Weakness: Secondary | ICD-10-CM | POA: Diagnosis present

## 2023-05-21 DIAGNOSIS — R29898 Other symptoms and signs involving the musculoskeletal system: Secondary | ICD-10-CM

## 2023-05-21 DIAGNOSIS — T508X1A Poisoning by diagnostic agents, accidental (unintentional), initial encounter: Secondary | ICD-10-CM

## 2023-05-21 LAB — CBC WITH DIFFERENTIAL/PLATELET
Abs Immature Granulocytes: 0.03 10*3/uL (ref 0.00–0.07)
Basophils Absolute: 0 10*3/uL (ref 0.0–0.1)
Basophils Relative: 0 %
Eosinophils Absolute: 0.1 10*3/uL (ref 0.0–0.5)
Eosinophils Relative: 3 %
HCT: 35.3 % — ABNORMAL LOW (ref 36.0–46.0)
Hemoglobin: 10.8 g/dL — ABNORMAL LOW (ref 12.0–15.0)
Immature Granulocytes: 1 %
Lymphocytes Relative: 10 %
Lymphs Abs: 0.4 10*3/uL — ABNORMAL LOW (ref 0.7–4.0)
MCH: 27.4 pg (ref 26.0–34.0)
MCHC: 30.6 g/dL (ref 30.0–36.0)
MCV: 89.6 fL (ref 80.0–100.0)
Monocytes Absolute: 0.1 10*3/uL (ref 0.1–1.0)
Monocytes Relative: 4 %
Neutro Abs: 3 10*3/uL (ref 1.7–7.7)
Neutrophils Relative %: 82 %
Platelets: 161 10*3/uL (ref 150–400)
RBC: 3.94 MIL/uL (ref 3.87–5.11)
RDW: 15.5 % (ref 11.5–15.5)
WBC: 3.6 10*3/uL — ABNORMAL LOW (ref 4.0–10.5)
nRBC: 0 % (ref 0.0–0.2)

## 2023-05-21 LAB — BASIC METABOLIC PANEL
Anion gap: 7 (ref 5–15)
BUN: 20 mg/dL (ref 8–23)
CO2: 26 mmol/L (ref 22–32)
Calcium: 8 mg/dL — ABNORMAL LOW (ref 8.9–10.3)
Chloride: 108 mmol/L (ref 98–111)
Creatinine, Ser: 1.16 mg/dL — ABNORMAL HIGH (ref 0.44–1.00)
GFR, Estimated: 52 mL/min — ABNORMAL LOW (ref >60)
Glucose, Bld: 115 mg/dL — ABNORMAL HIGH (ref 70–99)
Potassium: 4 mmol/L (ref 3.5–5.1)
Sodium: 141 mmol/L (ref 135–145)

## 2023-05-21 LAB — URINALYSIS, ROUTINE W REFLEX MICROSCOPIC
Bilirubin Urine: NEGATIVE
Glucose, UA: NEGATIVE mg/dL
Hgb urine dipstick: NEGATIVE
Ketones, ur: NEGATIVE mg/dL
Leukocytes,Ua: NEGATIVE
Nitrite: NEGATIVE
Protein, ur: NEGATIVE mg/dL
Specific Gravity, Urine: 1.018 (ref 1.005–1.030)
pH: 5 (ref 5.0–8.0)

## 2023-05-21 LAB — MAGNESIUM: Magnesium: 2.5 mg/dL — ABNORMAL HIGH (ref 1.7–2.4)

## 2023-05-21 MED ORDER — HYDROMORPHONE HCL 1 MG/ML IJ SOLN
0.5000 mg | Freq: Once | INTRAMUSCULAR | Status: AC
  Administered 2023-05-21: 0.5 mg via INTRAVENOUS
  Filled 2023-05-21: qty 1

## 2023-05-21 MED ORDER — LACTATED RINGERS IV BOLUS
500.0000 mL | Freq: Once | INTRAVENOUS | Status: AC
  Administered 2023-05-21: 500 mL via INTRAVENOUS

## 2023-05-21 MED ORDER — METHYLPREDNISOLONE SODIUM SUCC 125 MG IJ SOLR
125.0000 mg | Freq: Once | INTRAMUSCULAR | Status: AC
  Administered 2023-05-21: 125 mg via INTRAVENOUS
  Filled 2023-05-21: qty 2

## 2023-05-21 MED ORDER — GADOBUTROL 1 MMOL/ML IV SOLN
10.0000 mL | Freq: Once | INTRAVENOUS | Status: AC | PRN
  Administered 2023-05-21: 10 mL via INTRAVENOUS

## 2023-05-21 MED ORDER — EPINEPHRINE 0.3 MG/0.3ML IJ SOAJ
0.3000 mg | Freq: Once | INTRAMUSCULAR | Status: AC
  Administered 2023-05-21: 0.3 mg via INTRAMUSCULAR
  Filled 2023-05-21: qty 0.3

## 2023-05-21 MED ORDER — DIPHENHYDRAMINE HCL 50 MG/ML IJ SOLN
25.0000 mg | Freq: Once | INTRAMUSCULAR | Status: AC
  Administered 2023-05-21: 25 mg via INTRAVENOUS
  Filled 2023-05-21: qty 1

## 2023-05-21 MED ORDER — LORAZEPAM 2 MG/ML IJ SOLN
0.5000 mg | Freq: Once | INTRAMUSCULAR | Status: AC
  Administered 2023-05-21: 0.5 mg via INTRAVENOUS
  Filled 2023-05-21: qty 1

## 2023-05-21 MED ORDER — FAMOTIDINE IN NACL 20-0.9 MG/50ML-% IV SOLN
20.0000 mg | Freq: Once | INTRAVENOUS | Status: AC
  Administered 2023-05-21: 20 mg via INTRAVENOUS
  Filled 2023-05-21: qty 50

## 2023-05-21 NOTE — ED Notes (Addendum)
7:11 PM  Report received from previous RN. This RN assumes care of the patient.   7:14 PM  Patient reports left leg "heaviness" with upper to middle back progressively worsening x 1 week. Patient has hx breast CA and receives daily radiation treatments. Currently rating pain 5/10 on pain scale. Patient is alert and oriented x 4. She has equal rise and fall of the chest wall. She is currently denies SOB, loss of bowel/bladder, urinary sx, CP, nausea, vomiting, fevers. Pending MRI results. Patient denies any needs at this time. Family is present at the bedside. Call light in reach. Bed in lowest position.    7:40 PM  Patient assisted on bedpan. Urine collected and sent to lab.   8:34 PM  Patient resting in stretcher with equal rise and fall of the chest wall. Patient in NAD. No needs voiced by patient at this time. Call light in reach. Bed in lowest position. Pending results. Family present at bedside.   8:56 PM  Discharge instructions discussed with patient and family. Patient and family voice understanding of discharge instructions and declines any questions, needs, or concerns regarding discharge. Patient is stable at discharge. Patient provided wheelchair at discharge.

## 2023-05-21 NOTE — Progress Notes (Signed)
Pt received MRI contrast today. Pt squeezed emergency ball while in the scanner. Pt pulled out and assessed by Radiologist Dr. Reche Dixon. Pt states she was itchy and had SOB. Pt had multiple hives on back and swelling to face. Dr. Reche Dixon directive to place pt on O2 and take pt back to ER. Post contrast imaging was not obtained. Pt arrived to ER and was assessed by Dr. Jearld Fenton.

## 2023-05-21 NOTE — ED Triage Notes (Signed)
Pt returning today for same complaint.   Pts receiving radiation therapy to her spine. Pt experiencing weakness in her left foot that has been going on for apprx 2 wks.  Pts radiologist (Dr. Mitzi Hansen) wanted pt seen, and a MRI performed on her spine over concerns of damage to that area related to the radiation therapy.

## 2023-05-21 NOTE — Discharge Instructions (Signed)
As we discussed, you have mets to your spine.  You do not have any compression of your spinal cord from your mets.  I recommend that you fill your dexamethasone as prescribed by your oncologist.  I also recommend that you take your hydrocodone as prescribed by your doctor.  You need to follow-up with Dr. Mitzi Hansen on Monday to start radiation to help control your symptoms  Return to ER if you have worse weakness or leg pain or back pain or trouble urinating

## 2023-05-21 NOTE — ED Provider Notes (Signed)
  Physical Exam  BP 137/71 (BP Location: Right Arm)   Pulse 72   Temp 97.9 F (36.6 C) (Oral)   Resp 11   LMP 01/28/2012   SpO2 99%   Physical Exam  Procedures  Procedures  ED Course / MDM   Clinical Course as of 05/21/23 2042  Fri May 21, 2023  1127 Hemoglobin(!): 10.8 Hgb decreased from 12.4 a few days ago [HN]  1128 Platelets: 161 Platelets downtrending as well from 200s [HN]  1128 WBC(!): 3.6 Downtrending  [HN]  1527 Patient sent back from MRI. She had allergic reaction to contrast with hives, SOB, hypoxia into 80s. Radiologist came to evaluate but did not get epinephrine there.  [HN]  1551 On arrival, patient with diffuse facial swelling, no oropharyngeal swelling/angioedema, with expiratory hives, fatigue, SOB, and diffuse hives. She is given one dose of epinephrine IM and solumedrol, benadryl, and famotidine. Patient feels improved almost immediately. Gadolinium contrast is added to patient's allergies. Discussed with patient and her sister about never receiving that contrast again. MRI images were able to be obtained and will await results. Patient will need obs in the ED.  [HN]    Clinical Course User Index [HN] Loetta Rough, MD   Medical Decision Making Care assumed at 4 PM.  Patient is here with worsening back pain.  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  Patient also has worsening weakness.  Patient is sent in by oncology to rule out cord compression.  While she was in MRI, she developed anaphylaxis to gadolinium.  Patient was brought back to the ER and was given IM epi around 4 PM.  8:43 PM I reviewed patient's labs and independently interpreted MRI.  Patient has diffuse metastatic infiltration to the marrow spaces.  Patient did not have any cord compression.  Patient states that she is breathing better.  Her lungs are clear right now.  Her oropharynx is clear.  She is being observed more than 4 hours since her epinephrine dose.  Patient also was  prescribed dexamethasone from oncology for her back.  I discussed with Dr. Mitzi Hansen who knows her very well.  He states that since she has no cord compression, she does not need to be admitted and does not need urgent radiation.  He plans to do radiation on Monday.  She states that she also has pain medicine at home and can take them.  I offered admission for pain control but she states that she wants to go home.  Problems Addressed: Anaphylaxis due to contrast media: acute illness or injury Anemia, unspecified type: chronic illness or injury Pain from bone metastases [facility]): acute illness or injury Weakness of both lower extremities: acute illness or injury  Amount and/or Complexity of Data Reviewed Labs: ordered. Decision-making details documented in ED Course. Radiology: ordered and independent interpretation performed. Decision-making details documented in ED Course.  Risk Prescription drug management.          Charlynne Pander, MD 05/21/23 [phone]

## 2023-05-21 NOTE — ED Provider Notes (Signed)
[facility] Provider Note   CSN: [id number] Arrival date & time: 05/21/23  8295     History  Chief Complaint  Patient presents with   Foot Injury   Back Pain    Kayla Banks is a 67 y.o. female with metastatic breast cancer on radiation who presents with need for MRI. Accompanied by her sister. Patient is receiving radiation therapy to her spine, ribs, R shoulder. Pt experiencing weakness in her bilateral feet that has been going on for apprx 1 week. Per report it was L>R but patient state it has actually been both.  Pts radiologist (Dr. Mitzi Hansen) wanted pt seen in ED and an emergent MRI performed on her spine over concerns of damage to that area related to the radiation therapy. Patient denies increased pain, urinary/bowel incontinence/retention. 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  Denies f/c. Also has had some increased constipation which is recently resolved d/t laxatives. Takes hydrocodone at home. Recently had bronchoscopy on 05/13/23      Past Medical History:  Diagnosis Date   Breast cancer (HCC)    Complication of anesthesia    Family history of breast cancer    Heart murmur    no problems, saw cardiologist 2010   Hypertension    on meds   Hypothyroidism    Osteoarthritis    Personal history of chemotherapy    Personal history of radiation therapy    left   Plantar fasciitis    PONV (postoperative nausea and vomiting)    Thyroid disease    Hypothyroidism       Home Medications Prior to Admission medications   Medication Sig Start Date End Date Taking? Authorizing Provider  abemaciclib (VERZENIO) 100 MG tablet Take 1 tablet (100 mg total) by mouth 2 (two) times daily. 05/04/23   Serena Croissant, MD  amLODipine (NORVASC) 5 MG tablet Take 1 tablet (5 mg total) by mouth daily. Needs ov before any more refills 04/09/23   Zola Button, Myrene Buddy R, DO  carvedilol  (COREG) 25 MG tablet Take 1 tablet (25 mg total) by mouth 2 (two) times daily with a meal. Due for appt 03/2023 04/09/23   Seabron Spates R, DO  dexamethasone (DECADRON) 4 MG tablet Take 1 tablet (4 mg total) by mouth 2 (two) times daily. 05/20/23   Ronny Bacon, PA-C  EPINEPHRINE 0.3 mg/0.3 mL IJ SOAJ injection INJECT 0.3 ML(1 SYRINGE) IN THE MUSCLE AS NEEDED FOR ALLERGIC REACTION Patient not taking: Reported on 05/17/2023 01/14/17   Donato Schultz, DO  gabapentin (NEURONTIN) 300 MG capsule Take 1 capsule (300 mg total) by mouth 2 (two) times daily. Patient taking differently: Take 300 mg by mouth 2 (two) times daily. 07/02/22   Donato Schultz, DO  HYDROcodone-acetaminophen (NORCO) 5-325 MG tablet Take 1-2 tablets by mouth every 4 (four) hours as needed. 04/30/23   Serena Croissant, MD  letrozole [facility]) 2.5 MG tablet Take 1 tablet (2.5 mg total) by mouth daily. 05/04/23   Serena Croissant, MD  meloxicam (MOBIC) 15 MG tablet Take 1 tablet (15 mg total) by mouth daily as needed for pain. Patient taking differently: Take 15 mg by mouth daily as needed for pain. 04/09/23   Donato Schultz, DO  ondansetron (ZOFRAN) 8 MG tablet 1/2 to 1 tablet q 12 hours prn nausea Patient not taking: Reported on 05/17/2023 05/07/23   Serena Croissant, MD  SYNTHROID 100 MCG tablet Take 1 tablet (100 mcg total) by mouth daily before breakfast. 04/09/23   Zola Button, Grayling Congress, DO  valACYclovir (VALTREX) 1000 MG tablet TAKE 1 TABLET BY MOUTH THREE TIMES DAILY AS NEEDED Patient taking differently: Take 1,000 mg by mouth as needed. Takes 1 tablet by mouth daily as needed for cold sores 01/01/20   Zola Button, Grayling Congress, DO      Allergies    Gadolinium derivatives, Erythromycin, Lisinopril, Penicillins, and Adhesive [tape]    Review of Systems   Review of Systems A 10 point review of systems was performed and is negative unless otherwise reported in HPI.  Physical Exam Updated Vital Signs BP (!) 150/68    Pulse (!) 56   Temp 97.8 F (36.6 C) (Oral)   Resp 14   LMP 01/28/2012   SpO2 100%  Physical Exam General: Normal appearing female, lying in bed.  HEENT: PERRLA, Sclera anicteric, MMM, trachea midline.  Cardiology: RRR, no murmurs/rubs/gallops. BL radial and DP pulses equal bilaterally.  Resp: Normal respiratory rate and effort. CTAB, no wheezes, rhonchi, crackles.  Abd: Soft, non-tender, non-distended. No rebound tenderness or guarding.  GU: Deferred. MSK: No peripheral edema or signs of trauma. Extremities without deformity or TTP. No cyanosis or clubbing. Skin: warm, dry. No rashes or lesions. Back: No midline C T or L spine TTP.  Neuro: A&Ox4, CNs II-XII grossly intact. 5/5 strength BL UEs. 4/5 strength in hip flexion, knee flexion/extension, dorsiflexion/plantarflexion bilateral lower extremities. Sensation grossly intact.  Psych: Normal mood and affect.   ED Results / Procedures / Treatments   Labs (all labs ordered are listed, but only abnormal results are displayed) Labs Reviewed  CBC WITH DIFFERENTIAL/PLATELET - Abnormal; Notable for the following components:      Result Value   WBC 3.6 (*)    Hemoglobin 10.8 (*)    HCT 35.3 (*)    Lymphs Abs 0.4 (*)    All other components within normal limits  BASIC METABOLIC PANEL - Abnormal; Notable for the following components:   Glucose, Bld 115 (*)    Creatinine, Ser 1.16 (*)    Calcium 8.0 (*)    GFR, Estimated 52 (*)    All other components within normal limits  MAGNESIUM - Abnormal; Notable for the following components:   Magnesium 2.5 (*)    All other components within normal limits  URINALYSIS, ROUTINE W REFLEX MICROSCOPIC    EKG None  Radiology No results found.  Procedures .Critical Care  Performed by: Loetta Rough, MD Authorized by: Loetta Rough, MD   Critical care provider statement:    Critical care time (minutes):  30   Critical care was necessary to treat or prevent imminent or  life-threatening deterioration of the following conditions: anaphylaxis.   Critical care was time spent personally by me on the following activities:  Development of treatment plan with patient or surrogate, discussions with consultants, evaluation of patient's response to treatment, examination of patient, ordering and review of laboratory studies, ordering and review of radiographic studies, ordering and performing treatments and interventions, pulse oximetry, re-evaluation of patient's condition, review of old charts and obtaining history from patient or surrogate     Medications Ordered in ED Medications  lactated ringers bolus 500 mL (0 mLs Intravenous Stopped 05/21/23 1215)  LORazepam (ATIVAN) injection 0.5 mg (0.5 mg Intravenous Given 05/21/23 1422)  HYDROmorphone (DILAUDID) injection 0.5 mg (0.5 mg Intravenous Given 05/21/23 1421)  gadobutrol (GADAVIST) 1 MMOL/ML injection 10  mL (10 mLs Intravenous Contrast Given 05/21/23 1513)  EPINEPHrine (EPI-PEN) injection 0.3 mg (0.3 mg Intramuscular Given 05/21/23 1541)  methylPREDNISolone sodium succinate (SOLU-MEDROL) 125 mg/2 mL injection 125 mg (125 mg Intravenous Given 05/21/23 1541)  famotidine (PEPCID) IVPB 20 mg premix (20 mg Intravenous New Bag/Given 05/21/23 1545)  diphenhydrAMINE (BENADRYL) injection 25 mg (25 mg Intravenous Given 05/21/23 1542)    ED Course/ Medical Decision Making/ A&P                          Medical Decision Making Amount and/or Complexity of Data Reviewed Labs: ordered. Decision-making details documented in ED Course. Radiology: ordered.  Risk Prescription drug management.    This patient presents to the ED for concern of BL LE weakness, this involves an extensive number of treatment options, and is a complaint that carries with it a high risk of complications and morbidity.  I considered the following differential and admission for this acute, potentially life threatening condition.   MDM:    DDX for low back  pain includes but is not limited to:   Pt reports BL LE weakness with back pain red flags on history, including h/o malignancy and known spinal metastases. She has risk for cauda equina/conus medullaris syndrome. She has weakness in BL LEs and gratefully no other findings on history/physical exam to suggest spinal cord pathology but cannot r/o without MRI. Must also consider spinal epidural abscess, transverse myelitis, discitis though no fever/chills. She hasn't had trauma, presentation not consistent with  fracture (no trauma, no bony tenderness to palpation), AAA. MRI was requested by radiation oncologist and I agree, will proceed with MRI T and L spine w/ and w/o contrast. Also consider effects of her known peripheral neuropathy, electrolyte derangements, physical deconditioning, anemia. She has no urinary sxs but could also have UTI causing weakness, will get UA.    Clinical Course as of 05/21/23 1649  Fri May 21, 2023  1127 Hemoglobin(!): 10.8 Hgb decreased from 12.4 a few days ago [HN]  1128 Platelets: 161 Platelets downtrending as well from 200s [HN]  1128 WBC(!): 3.6 Downtrending  [HN]  1527 Patient sent back from MRI. She had allergic reaction to contrast with hives, SOB, hypoxia into 80s. Radiologist came to evaluate but did not get epinephrine there.  [HN]  1551 On arrival, patient with diffuse facial swelling, no oropharyngeal swelling/angioedema, with expiratory hives, fatigue, SOB, and diffuse hives. She is given one dose of epinephrine IM and solumedrol, benadryl, and famotidine. Patient feels improved almost immediately. Gadolinium contrast is added to patient's allergies. Discussed with patient and her sister about never receiving that contrast again. MRI images were able to be obtained and will await results. Patient will need obs in the ED.  [HN]    Clinical Course User Index [HN] Loetta Rough, MD    Labs: I Ordered, and personally interpreted labs.  The pertinent results  include:  those listed above  Imaging Studies ordered: I ordered imaging studies including MRI T and L spine w/w/o contrast I independently visualized imaging.  Additional history obtained from chart review, sister at bedside.    Cardiac Monitoring: The patient was maintained on a cardiac monitor.  I personally viewed and interpreted the cardiac monitored which showed an underlying rhythm of: NSR  Reevaluation: After the interventions noted above, I reevaluated the patient and found that they have : Improved  Social Determinants of Health: Lives independently  Disposition:  Patient is signed out  to the oncoming ED physician Dr. Silverio Lay who is made aware of her history, presentation, exam, workup, and plan. Plan is to obtain MRI read, UA, and monitor for observation for anaphylaxis. Will likely need to discuss with radiation oncology vs oncology.    Co morbidities that complicate the patient evaluation  Past Medical History:  Diagnosis Date   Breast cancer (HCC)    Complication of anesthesia    Family history of breast cancer    Heart murmur    no problems, saw cardiologist 2010   Hypertension    on meds   Hypothyroidism    Osteoarthritis    Personal history of chemotherapy    Personal history of radiation therapy    left   Plantar fasciitis    PONV (postoperative nausea and vomiting)    Thyroid disease    Hypothyroidism     Medicines Meds ordered this encounter  Medications   lactated ringers bolus 500 mL   LORazepam (ATIVAN) injection 0.5 mg   HYDROmorphone (DILAUDID) injection 0.5 mg   gadobutrol (GADAVIST) 1 MMOL/ML injection 10 mL   EPINEPHrine (EPI-PEN) injection 0.3 mg   methylPREDNISolone sodium succinate (SOLU-MEDROL) 125 mg/2 mL injection 125 mg    IV methylprednisolone will be converted to either a q12h or q24h frequency with the same total daily dose (TDD).  Ordered Dose: 1 to 125 mg TDD; convert to: TDD q24h.  Ordered Dose: 126 to 250 mg TDD; convert to:  TDD div q12h.  Ordered Dose: >250 mg TDD; DAW.   famotidine (PEPCID) IVPB 20 mg premix   diphenhydrAMINE (BENADRYL) injection 25 mg    I have reviewed the patients home medicines and have made adjustments as needed  Problem List / ED Course: Problem List Items Addressed This Visit   None Visit Diagnoses     Weakness of both lower extremities    -  Primary   Anaphylaxis due to contrast media                       This note was created using dictation software, which may contain spelling or grammatical errors.    Loetta Rough, MD 05/21/23 [id number]

## 2023-05-21 NOTE — ED Notes (Signed)
Pt experienced allergic reaction to MRI contrast, upon assessment --pt's face visibly swollen, red and splotchy rash to both cheeks. Pt reports difficulty breathing, expiratory wheezes audible. MD currently at bedside assessing pt, orders initiated.

## 2023-05-24 ENCOUNTER — Other Ambulatory Visit: Payer: Self-pay

## 2023-05-24 ENCOUNTER — Telehealth: Payer: Self-pay | Admitting: Radiation Oncology

## 2023-05-24 ENCOUNTER — Ambulatory Visit
Admission: RE | Admit: 2023-05-24 | Discharge: 2023-05-24 | Disposition: A | Discharge status: Home / Self Care | Payer: BC Managed Care – PPO | Source: Ambulatory Visit | Attending: Radiation Oncology | Admitting: Radiation Oncology

## 2023-05-24 DIAGNOSIS — C7951 Secondary malignant neoplasm of bone: Secondary | ICD-10-CM | POA: Diagnosis not present

## 2023-05-24 LAB — RAD ONC ARIA SESSION SUMMARY
Course Elapsed Days: 14
Plan Fractions Treated to Date: 7
Plan Fractions Treated to Date: 7
Plan Fractions Treated to Date: 7
Plan Prescribed Dose Per Fraction: 3 Gy
Plan Prescribed Dose Per Fraction: 3 Gy
Plan Prescribed Dose Per Fraction: 3 Gy
Plan Total Fractions Prescribed: 10
Plan Total Fractions Prescribed: 10
Plan Total Fractions Prescribed: 10
Plan Total Prescribed Dose: 30 Gy
Plan Total Prescribed Dose: 30 Gy
Plan Total Prescribed Dose: 30 Gy
Reference Point Dosage Given to Date: 21 Gy
Reference Point Dosage Given to Date: 21 Gy
Reference Point Dosage Given to Date: 21 Gy
Reference Point Session Dosage Given: 3 Gy
Reference Point Session Dosage Given: 3 Gy
Reference Point Session Dosage Given: 3 Gy
Session Number: 7

## 2023-05-24 NOTE — Telephone Encounter (Signed)
I spoke with the patient today to summarize what the findings were from her ER visit.  Dr. Mitzi Hansen recommends additional radiation to the left pelvis and lumbar spine, we discussed starting steroids which she has not yet done with the hopes of improving her pain as well as to reduce risks of further progression of her disease in the spine into the spinal canal.  She is in agreement with this.  Dr. Mitzi Hansen does not feel like we need to restimulate her in order to add these additional sites for her treatment but we will need to update her consent.  I will confirm the levels he plans to treat, and update consent with the patient this week prior to her beginning treatment which I suspect will be on Wednesday.  She is in agreement with this plan.

## 2023-05-25 ENCOUNTER — Other Ambulatory Visit: Payer: Self-pay

## 2023-05-25 ENCOUNTER — Ambulatory Visit
Admission: RE | Admit: 2023-05-25 | Discharge: 2023-05-25 | Disposition: A | Discharge status: Home / Self Care | Payer: BC Managed Care – PPO | Source: Ambulatory Visit | Attending: Radiation Oncology | Admitting: Radiation Oncology

## 2023-05-25 ENCOUNTER — Ambulatory Visit: Payer: BC Managed Care – PPO | Admitting: Acute Care

## 2023-05-25 ENCOUNTER — Ambulatory Visit: Payer: BC Managed Care – PPO

## 2023-05-25 ENCOUNTER — Encounter: Payer: Self-pay | Admitting: Acute Care

## 2023-05-25 VITALS — BP 94/50 | HR 64 | Temp 98.3°F | Ht 66.0 in | Wt 237.6 lb

## 2023-05-25 DIAGNOSIS — C50919 Malignant neoplasm of unspecified site of unspecified female breast: Secondary | ICD-10-CM

## 2023-05-25 DIAGNOSIS — Z9889 Other specified postprocedural states: Secondary | ICD-10-CM | POA: Diagnosis not present

## 2023-05-25 DIAGNOSIS — C3490 Malignant neoplasm of unspecified part of unspecified bronchus or lung: Secondary | ICD-10-CM | POA: Diagnosis not present

## 2023-05-25 DIAGNOSIS — C7951 Secondary malignant neoplasm of bone: Secondary | ICD-10-CM

## 2023-05-25 LAB — RAD ONC ARIA SESSION SUMMARY
Course Elapsed Days: 15
Plan Fractions Treated to Date: 1
Plan Fractions Treated to Date: 8
Plan Fractions Treated to Date: 8
Plan Prescribed Dose Per Fraction: 3 Gy
Plan Prescribed Dose Per Fraction: 3 Gy
Plan Prescribed Dose Per Fraction: 3 Gy
Plan Total Fractions Prescribed: 10
Plan Total Fractions Prescribed: 10
Plan Total Fractions Prescribed: 3
Plan Total Prescribed Dose: 30 Gy
Plan Total Prescribed Dose: 30 Gy
Plan Total Prescribed Dose: 9 Gy
Reference Point Dosage Given to Date: 24 Gy
Reference Point Dosage Given to Date: 24 Gy
Reference Point Dosage Given to Date: 24 Gy
Reference Point Session Dosage Given: 3 Gy
Reference Point Session Dosage Given: 3 Gy
Reference Point Session Dosage Given: 3 Gy
Session Number: 8

## 2023-05-25 NOTE — Patient Instructions (Addendum)
It is good to see you today. I am glad you have done well after your procedure. You biopsy did show that the lymph node that Dr. Katrinka Blazing biopsied was positive for metastatic  cancer.  I will call today regarding the additional testing for final diagnosis.  You have established care with Dr. Pamelia Hoit. We will have him continue to follow you as he has been doing. I will make sure he is aware of the biopsy results.  Follow up with Dr. Mitzi Hansen  as is scheduled 05/27/2023.  Call if you need [place] for anything. Good luck with your treatment.  Please contact office for sooner follow up if symptoms do not improve or worsen or seek emergency care

## 2023-05-25 NOTE — Progress Notes (Signed)
History of Present Illness Kayla Banks is a 67 y.o. female never smoker with past medical history of breast cancer ( 2020) She was seen by Dr. Tonia Brooms 05/10/2023 as she was referred by Dr. Zola Button for consideration of bronchoscopy  to better evaluate  adenopathy noted on a restaging CT Chest.   Synopsis 67 year old female, past medical history of breast cancer, family history of breast cancer, history of chemotherapy and radiation. Patient also has a history of hypertension. Patient had  restaging CT imaging 04/2023. Ultimately she had a CT scan  done in the emergency department . It was concerning for  osseous metastatic disease and hilar adenopathy. She had a nuclear medicine PET scan completed on 04/30/2023 which revealed widespread metabolic bone disease, cervical thoracic lumbar spine bony rib involvement of her shoulders. Had hypermetabolic right paratracheal and hilar adenopathy concerning  for metastatic breast cancer . She had a bronchoscopy with EBUS and biopsy on 05/13/2023,  of the 4 R lymph node by Dr. Katrinka Blazing( Noted as 10 R by pathology as it was mislabeled in the room per Dr. Michaelle Copas note). She is here today as postprocedure follow-up and to review cytology results.  SUMMARY OF ONCOLOGIC HISTORY:    Oncology History  Malignant neoplasm of upper-outer quadrant of left breast in female, estrogen receptor positive (HCC)  08/24/2018 Initial Biopsy    Screening detected left breast mass at 2:30 position 6 mm in size with one abnormal lymph node and stable benign calcifications, biopsy of the mass grade 2 IDC with DCIS and lymphovascular invasion, lymph node biopsy is positive, ER 95%, PR 40%, Ki-67 20%, HER-2 -1+ by IHC, T1b N1 stage Ib    08/31/2018 Cancer Staging    Staging form: Breast, AJCC 8th Edition - Clinical: Stage IB (cT1b, cN1(f), cM0, G2, ER+, PR+, HER2-) - Signed by Serena Croissant, MD on 08/31/2018    09/21/2018 Genetic Testing    Genetic testing performed through [facility]'s  Common Hereditary Cancers Panel reported out on 09/19/2018 showed no pathogenic mutations.  The Common Hereditary Cancer Panel offered by [facility] includes sequencing and/or deletion duplication testing of the following 53 genes: APC, ATM, AXIN2, BARD1, BMPR1A, BRCA1, BRCA2, BRIP1, BUB1B, CDH1, CDK4, CDKN2A, CHEK2, CTNNA1, DICER1, ENG, EPCAM, GALNT12, GREM1, HOXB13, KIT, MEN1, MLH1, MLH3, MSH2, MSH3, MSH6, MUTYH, NBN, NF1, NTHL1, PALB2, PDGFRA, PMS2, POLD1, POLE, PTEN, RAD50, RAD51C, RAD51D, RNF43, RPS20, SDHA, SDHB, SDHC, SDHD, SMAD4, SMARCA4, STK11, TP53, TSC1, TSC2, VHL.   A variant of uncertain significance (VUS) in a gene called NBN was also noted. c.1979G>C (p.Arg660Thr)    10/18/2018 Cancer Staging    Staging form: Breast, AJCC 8th Edition - Pathologic stage from 10/18/2018: Stage IB (pT1b, pN2a, cM0, G2, ER+, PR+, HER2-) - Signed by Serena Croissant, MD on 11/14/2018    11/09/2018 Surgery    Left lumpectomy ([facility]) [phone]): Grade 2 IDC with DCIS, 1 cm, margins negative, DCIS focally 0.1 cm from superior margin, 2/2 lymph nodes positive, ER 95%, PR 45%, HER-2 negative, Ki-67 20% AXLND: 3/20 LN Pos (total 5/22)    12/15/2018 -  Chemotherapy    DOXOrubicin (ADRIAMYCIN) chemo injection 142 mg, 60 mg/m2 = 142 mg, Intravenous,  Once, 4 of 4 cycles. Administration: 142 mg (12/15/2018), 142 mg (12/29/2018), 142 mg (01/12/2019), 142 mg (01/26/2019)   palonosetron (ALOXI) injection 0.25 mg, 0.25 mg, Intravenous,  Once, 4 of 4 cycles. Administration: 0.25 mg (12/15/2018), 0.25 mg (12/29/2018), 0.25 mg (01/12/2019), 0.25 mg (01/26/2019)   pegfilgrastim-cbqv (UDENYCA) injection 6 mg, 6 mg,  Subcutaneous, Once, 4 of 4 cycles. Administration: 6 mg (12/17/2018), 6 mg (12/31/2018), 6 mg (01/14/2019), 6 mg (01/28/2019)   cyclophosphamide (CYTOXAN) 1,420 mg in sodium chloride 0.9 % 250 mL chemo infusion, 600 mg/m2 = 1,420 mg, Intravenous,  Once, 4 of 4 cycles. Administration: 1,420 mg (12/15/2018), 1,420 mg (12/29/2018), 1,420  mg (01/12/2019), 1,420 mg (01/26/2019)   PACLitaxel (TAXOL) 192 mg in sodium chloride 0.9 % 250 mL chemo infusion (</= 80mg /m2), 80 mg/m2 = 192 mg, Intravenous,  Once, 11 of 12 cycles. Dose modification: 60 mg/m2 (original dose 80 mg/m2, Cycle 12, Reason: Dose not tolerated). Administration: 192 mg (02/09/2019), 192 mg (02/16/2019), 192 mg (02/23/2019), 192 mg (03/02/2019), 192 mg (03/09/2019), 192 mg (03/16/2019), 192 mg (03/23/2019), 144 mg (03/30/2019), 144 mg (04/06/2019), 144 mg (04/14/2019), 144 mg (04/20/2019)   fosaprepitant (EMEND) 150 mg   dexamethasone (DECADRON) 12 mg in sodium chloride 0.9 % 145 mL IVPB, , Intravenous,  Once, 4 of 4 cycles Administration:  (12/15/2018),  (12/29/2018),  (01/12/2019),  (01/26/2019)        06/01/2019 - 07/21/2019 Radiation Therapy    The patient initially received a dose of 50.4 Gy in 28 fractions to the breast using whole-breast tangent fields. This was delivered using a 3-D conformal technique. The pt received a boost delivering an additional 10 Gy in 5 fractions using a electron boost with electrons. The total dose was 60.4 Gy.      08/2019 - 08/2024 Anti-estrogen oral therapy    Anastrozole         05/25/2023 Follow up after bronchoscopy with biopsies. Pt. Presents for follow up after Flexible bronchoscopy with EBUS done 05/13/2023 by Dr. Katrinka Blazing. Per the procedure note, there were no endobronchial lesions ,there was  an enlarged 4 R node ( Accidentally labeled 10 R per Dr. Michaelle Copas note).   Patient states she has done well after the procedure. She does have baseline fatigue. She denies any fever,  hemoptysis,  discolored secretions,  adverse anesthesia reactions,  dyspnea or shortness of breath.  She feels overall she tolerated the procedure well.  We have discussed her biopsy results .  Cytology came back as a final diagnosis of of metastatic  carcinoma. While it is metastatic, there is not a specific cell type diagnosis.  Per the cytology report ,plan was for  additional testing and an addendum, which has not been noted yet. I have told the patient that I will call pathology and ask them to expedite that result.   Patiently is currently undergoing treatment by Dr. Ave Filter for medical management and chemotherapy of her cancer, and Dr. Mitzi Hansen for palliative radiation.  Dr. Mitzi Hansen is recommending additional radiation and initiation of steroids with hopes of improving her pain as well as reducing risk of progression of her disease in the spine into the spinal canal.    She saw Dr. Ave Filter last on 05/04/2023.  He had already added Verzenio and switched anastrozole to letrozole anticipating this could potentially be a malignancy.  She is being followed closely by both, and has follow-up appointments scheduled.  Test Results: Cytology results 05/13/2023 LYMPH NODE, 10R, FINE NEEDLE ASPIRATION:  - Metastatic carcinoma.  - See comment.     Latest Ref Rng & Units 05/21/2023   10:47 AM 05/20/2023    6:53 PM 05/17/2023    9:03 AM  CBC  WBC 4.0 - 10.5 K/uL 3.6  4.9  7.8   Hemoglobin 12.0 - 15.0 g/dL 36.6  44.0  34.7   Hematocrit  36.0 - 46.0 % 35.3  36.5  38.3   Platelets 150 - 400 K/uL 161  201  245        Latest Ref Rng & Units 05/21/2023   10:47 AM 05/20/2023    6:53 PM 05/17/2023    9:03 AM  BMP  Glucose 70 - 99 mg/dL 010  272  536   BUN 8 - 23 mg/dL 20  19  28    Creatinine 0.44 - 1.00 mg/dL 6.44  0.34  7.42   Sodium 135 - 145 mmol/L 141  141  142   Potassium 3.5 - 5.1 mmol/L 4.0  4.0  4.0   Chloride 98 - 111 mmol/L 108  108  105   CO2 22 - 32 mmol/L 26  26  30    Calcium 8.9 - 10.3 mg/dL 8.0  8.3  59.5     BNP No results found for: "BNP"  ProBNP No results found for: "PROBNP"  PFT No results found for: "FEV1PRE", "FEV1POST", "FVCPRE", "FVCPOST", "TLC", "DLCOUNC", "PREFEV1FVCRT", "PSTFEV1FVCRT"  MR Lumbar Spine W Wo Contrast  Result Date: 05/21/2023 CLINICAL DATA:  Breast cancer with metastatic disease. Status post radiation. Left foot weakness  for 2 weeks. EXAM: MRI LUMBAR SPINE WITHOUT AND WITH CONTRAST TECHNIQUE: Multiplanar and multiecho pulse sequences of the lumbar spine were obtained without and with intravenous contrast. The patient had a reaction to the contrast developing pruritus and highs. She had facial swelling. She was returned to the emergency department for treatment. CONTRAST:  10mL GADAVIST GADOBUTROL 1 MMOL/ML IV SOLN COMPARISON:  PET scan 04/26/2023. CT angio abdomen and pelvis 04/02/2023. FINDINGS: Segmentation: 5 non rib-bearing lumbar type vertebral bodies are present. The lowest fully formed vertebral body is L5. Alignment: Slight retrolisthesis is present at T12-L1. Mild degenerative anterolisthesis is present at L2-3. No other significant listhesis is present. Lumbar lordosis is preserved. Vertebrae: Diffuse metastatic infiltration of the marrow spaces is present. Postcontrast images demonstrate extensive pathologic enhancement the vertebral bodies and posterior elements. No pathologic fractures are present. Conus medullaris and cauda equina: Conus extends to the L1 level. Conus and cauda equina appear normal. Paraspinal and other soft tissues: Limited imaging the abdomen is unremarkable. There is no significant adenopathy. No solid organ lesions are present. Disc levels: L1-2: A broad-based disc protrusion is present. Mild facet hypertrophy is noted bilaterally. Mild subarticular and foraminal stenosis is present bilaterally. L2-3: A broad-based disc protrusion is present. Moderate facet hypertrophy is present. Mild central canal stenosis is present. The foramina are patent bilaterally. L3-4: A leftward disc protrusion is present. Mild subarticular narrowing is present bilaterally. Mild bilateral foraminal stenosis is worse on the right. L4-5: Right laminectomy decompresses the posterior canal posteriorly. The central canal is patent. The foramina are patent. L5-S1: Asymmetric right-sided facet hypertrophy is present. Tumor  expands the bone posteriorly on the right. Moderate right foraminal stenosis is present. The left foramen is patent. IMPRESSION: 1. Diffuse metastatic infiltration of the marrow spaces. 2. Right laminectomy at L4-5 without residual or recurrent stenosis. 3. Moderate right foraminal stenosis at L5-S1 secondary to tumor expanding the bone posteriorly on the right. 4. Mild subarticular and foraminal stenosis bilaterally at L1-2 and L2-3. 5. Mild subarticular and foraminal stenosis bilaterally at L3-4 is worse on the right. Electronically Signed   By: Marin Roberts M.D.   On: 05/21/2023 18:36   MR THORACIC SPINE WO CONTRAST  Result Date: 05/21/2023 CLINICAL DATA:  Breast cancer. Left foot weakness for 2 weeks. Status post radiation therapy. EXAM:  MRI THORACIC SPINE WITHOUT CONTRAST TECHNIQUE: Multiplanar, multisequence MR imaging of the thoracic spine was performed. No intravenous contrast was administered. The patient developed parade is and hives with swelling to the face after contrast administration. Therefore postcontrast imaging was not obtained. The patient was taken back to the emergency room for treatment. COMPARISON:  CT scan 04/26/2023.  CT angio chest 04/02/2023. FINDINGS: Alignment: No significant listhesis is present. Normal thoracic kyphosis is present. Vertebrae: Diffuse infiltrative disease is present throughout the thoracic spine. Metastatic lesions are present at all visualized levels from C2 through L2. No pathologic fractures are present. Cord: Some distortion of the ventral surface cord is present at T7-8 secondary to disc disease. No abnormal signal is present. Paraspinal and other soft tissues: Bilateral pleural effusions are present. Paraspinous soft tissues are normal. The visualized abdomen is unremarkable. Disc levels: T1-2: Leftward disc bulge is present. Facet hypertrophy contributes to foraminal narrowing, left greater than right. Partial effacement of the ventral CSF is  present. T2-3: Facet spurring contributes to moderate left and mild right foraminal narrowing. T3-4: Mild right foraminal narrowing is secondary to facet disease. The central canal is patent. T4-5: Lateral disc protrusions are present. Facet spurring is worse on the right. Moderate right and mild left foraminal narrowing is present. C5-6: Leftward disc protrusion and facet hypertrophy contribute to mild left foraminal stenosis. T6-7: Disc protrusion and facet hypertrophy contribute to moderate left foraminal narrowing. T7-8: Ankylosis is present across the disc space. Central disc protrusion effaces the ventral CSF and slightly distorts the ventral surface of the cord. Mild foraminal narrowing is present bilaterally. T8-9: A rightward disc protrusion is present. Moderate right and mild left foraminal stenosis is present. Partial effacement of the ventral CSF is present. T9-10: A mild broad-based disc protrusion is present. Facet spurring is present bilaterally. Moderate foraminal narrowing is worse on the right. T10-11: A broad-based disc protrusion is present. Facet spurring contributes to moderate right and mild left foraminal narrowing. T11-12: Facet spurring is present.  No focal stenosis is present. T12-L1: A broad-based disc protrusion bilateral facet disease results in mild foraminal narrowing bilaterally. IMPRESSION: 1. Diffuse infiltrative disease throughout the thoracic spine and upper lumbar spine consistent with metastatic disease. 2. No pathologic fractures. 3. Multilevel spondylosis of the thoracic spine as described. 4. Bilateral pleural effusions. Electronically Signed   By: Marin Roberts M.D.   On: 05/21/2023 18:30   NM PET Image Initial (PI) Skull Base To Thigh  Result Date: 04/30/2023 CLINICAL DATA:  Initial treatment strategy for breast cancer. EXAM: NUCLEAR MEDICINE PET SKULL BASE TO THIGH TECHNIQUE: 12.2 mCi F-18 FDG was injected intravenously. Full-ring PET imaging was performed  from the skull base to thigh after the radiotracer. CT data was obtained and used for attenuation correction and anatomic localization. Fasting blood glucose: 94 mg/dl COMPARISON:  CTA chest abdomen pelvis 04/02/2023 FINDINGS: Mediastinal blood pool activity: SUV max 2.8 Liver activity: SUV max NA NECK: Symmetric uptake is identified in the tonsillar region bilaterally, potentially reactive. Incidental CT findings: None. CHEST: Right paratracheal and hilar lymphadenopathy seen on previous chest CT is hypermetabolic with uptake in the right paratracheal node demonstrating SUV max = 6.6 in right hilar hypermetabolism at SUV max = 6.9. No hypermetabolic axillary lymphadenopathy. No hypermetabolic breast lesion evident.Tiny bilateral pulmonary nodules are noted, better demonstrated on the previous chest CTA. While these are concerning for metastatic disease, they are below size threshold for resolution on PET imaging. Incidental CT findings: Coronary artery calcification is evident. Trace right  pleural effusion. Skin thickening noted left breast. ABDOMEN/PELVIS: No abnormal hypermetabolic activity within the liver, pancreas, adrenal glands, or spleen. No hypermetabolic lymph nodes in the abdomen or pelvis. Incidental CT findings: Punctate nonobstructing stone identified interpolar left kidney. Mild diverticular disease noted left colon without diverticulitis. SKELETON: Widespread hypermetabolic bone metastases evident. Index 2.4 cm lesion right T12 vertebral body demonstrates SUV max = 10.1. 1.7 cm posterior right iliac lesion on 143/4 demonstrates SUV max = 11.7. Incidental CT findings: None. IMPRESSION: 1. Widespread hypermetabolic bone metastases involving cervical, thoracic, and lumbar spine as well as the sternum, ribs, bony anatomy of the shoulders, and bony pelvis. 2. Hypermetabolic right paratracheal and right hilar lymphadenopathy consistent with metastatic disease. 3. Tiny bilateral pulmonary nodules are below  size threshold for resolution on PET imaging but raise concern for metastatic disease. 4. No evidence for hypermetabolic metastatic disease in the soft tissue organ anatomy of the abdomen/pelvis. Electronically Signed   By: Kennith Center M.D.   On: 04/30/2023 09:37     Past medical hx Past Medical History:  Diagnosis Date   Breast cancer (HCC)    Complication of anesthesia    Family history of breast cancer    Heart murmur    no problems, saw cardiologist 2010   Hypertension    on meds   Hypothyroidism    Osteoarthritis    Personal history of chemotherapy    Personal history of radiation therapy    left   Plantar fasciitis    PONV (postoperative nausea and vomiting)    Thyroid disease    Hypothyroidism     Social History   Tobacco Use   Smoking status: Never   Smokeless tobacco: Never  Vaping Use   Vaping status: Never Used  Substance Use Topics   Alcohol use: Not Currently    Comment: occassionally   Drug use: Never    Ms.Witter reports that she has never smoked. She has never used smokeless tobacco. She reports that she does not currently use alcohol. She reports that she does not use drugs.  Tobacco Cessation: Never smoker    Past surgical hx, Family hx, Social hx all reviewed.  Current Outpatient Medications on File Prior to Visit  Medication Sig   abemaciclib (VERZENIO) 100 MG tablet Take 1 tablet (100 mg total) by mouth 2 (two) times daily.   amLODipine (NORVASC) 5 MG tablet Take 1 tablet (5 mg total) by mouth daily. Needs ov before any more refills   carvedilol (COREG) 25 MG tablet Take 1 tablet (25 mg total) by mouth 2 (two) times daily with a meal. Due for appt 03/2023   dexamethasone (DECADRON) 4 MG tablet Take 1 tablet (4 mg total) by mouth 2 (two) times daily.   EPINEPHRINE 0.3 mg/0.3 mL IJ SOAJ injection INJECT 0.3 ML(1 SYRINGE) IN THE MUSCLE AS NEEDED FOR ALLERGIC REACTION   gabapentin (NEURONTIN) 300 MG capsule Take 1 capsule (300 mg total) by mouth  2 (two) times daily. (Patient taking differently: Take 300 mg by mouth 2 (two) times daily.)   HYDROcodone-acetaminophen (NORCO) 5-325 MG tablet Take 1-2 tablets by mouth every 4 (four) hours as needed.   letrozole (FEMARA) 2.5 MG tablet Take 1 tablet (2.5 mg total) by mouth daily.   meloxicam (MOBIC) 15 MG tablet Take 1 tablet (15 mg total) by mouth daily as needed for pain. (Patient taking differently: Take 15 mg by mouth daily as needed for pain.)   ondansetron (ZOFRAN) 8 MG tablet 1/2 to 1 tablet  q 12 hours prn nausea   SYNTHROID 100 MCG tablet Take 1 tablet (100 mcg total) by mouth daily before breakfast.   valACYclovir (VALTREX) 1000 MG tablet TAKE 1 TABLET BY MOUTH THREE TIMES DAILY AS NEEDED (Patient taking differently: Take 1,000 mg by mouth as needed. Takes 1 tablet by mouth daily as needed for cold sores)   No current facility-administered medications on file prior to visit.     Allergies  Allergen Reactions   Gadolinium Derivatives Anaphylaxis    MRI gadolinium contrast anaphylaxis with hives, facial swelling, wheezing, SOB, hypoxia   Erythromycin Swelling    Lip swelling; angioedema   Lisinopril Swelling    REACTION: angioedema   Penicillins Hives and Swelling    Swelling in arms & hands   Adhesive [Tape] Rash    Review Of Systems:  Constitutional:   No  weight loss, night sweats,  Fevers, chills, +fatigue, or  lassitude.  HEENT:   No headaches,  Difficulty swallowing,  Tooth/dental problems, or  Sore throat,                No sneezing, itching, ear ache, nasal congestion, post nasal drip,   CV:  No chest pain,  Orthopnea, PND, swelling in lower extremities, anasarca, dizziness, palpitations, syncope.   GI  No heartburn, indigestion, abdominal pain, nausea, vomiting, + diarrhea, +change in bowel habits, loss of appetite, bloody stools.   Resp: No shortness of breath with exertion or at rest.  No excess mucus, no productive cough,  No non-productive cough,  No coughing  up of blood.  No change in color of mucus.  No wheezing.  No chest wall deformity  Skin: no rash or lesions.  GU: no dysuria, change in color of urine, no urgency or frequency.  No flank pain, no hematuria   MS:  No joint pain or swelling.  + decreased range of motion.  + back pain.  Psych:  No change in mood or affect. No depression or anxiety.  No memory loss.   Vital Signs BP (!) 94/50 (BP Location: Right Arm, Patient Position: Sitting, Cuff Size: Large)   Pulse 64   Temp 98.3 F (36.8 C) (Oral)   Ht 5\' 6"  (1.676 m)   Wt 237 lb 9.6 oz (107.8 kg)   LMP 01/28/2012   SpO2 98%   BMI 38.35 kg/m    Physical Exam:  General- No distress,  A&Ox3, pleasant, appears tired ENT: No sinus tenderness, TM clear, pale nasal mucosa, no oral exudate,no post nasal drip, no LAN Cardiac: S1, S2, regular rate and rhythm, no murmur Chest: No wheeze/ rales/ dullness; no accessory muscle use, no nasal flaring, no sternal retractions, slightly diminished per bases bilaterally Abd.: Soft Non-tender, ND, BS +, Body mass index is 38.35 kg/m.  Ext: No clubbing cyanosis, edema Neuro:  normal strength, MAE x 4, A&O x 3, physical deconditioning Skin: No rashes, warm and dry,. 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  History of breast cancer  Post bronchoscopy with biopsy 05/13/2023 Bone metastasis per PET  Plan I am glad you have done well after your procedure. You biopsy did show that the lymph node that Dr. Katrinka Blazing biopsied was positive for metastatic  cancer.  I will call today regarding the additional testing for final diagnosis.  You have established care with Dr. Pamelia Hoit. We will have him continue to follow you as he  has been doing. I will make sure he is aware of the biopsy results.  Follow up with Dr. Mitzi Hansen  as is scheduled 05/27/2023.  Call if you need [place] for anything. Good luck  with your treatment.  Please contact office for sooner follow up if symptoms do not improve or worsen or seek emergency care   I spent 30 minutes dedicated to the care of this patient on the date of this encounter to include pre-visit review of records, face-to-face time with the patient discussing conditions above, post visit ordering of testing, clinical documentation with the electronic health record, making appropriate referrals as documented, and communicating necessary information to the patient's healthcare team.    Bevelyn Ngo, NP 05/25/2023  11:51 AM

## 2023-05-26 ENCOUNTER — Ambulatory Visit: Admission: RE | Admit: 2023-05-26 | Payer: BC Managed Care – PPO | Source: Ambulatory Visit

## 2023-05-26 ENCOUNTER — Other Ambulatory Visit: Payer: Self-pay | Admitting: Radiation Therapy

## 2023-05-26 ENCOUNTER — Ambulatory Visit: Payer: BC Managed Care – PPO

## 2023-05-26 ENCOUNTER — Other Ambulatory Visit: Payer: Self-pay | Admitting: Radiation Oncology

## 2023-05-26 ENCOUNTER — Ambulatory Visit
Admission: RE | Admit: 2023-05-26 | Discharge: 2023-05-26 | Disposition: A | Discharge status: Home / Self Care | Payer: BC Managed Care – PPO | Source: Ambulatory Visit | Attending: Radiation Oncology | Admitting: Radiation Oncology

## 2023-05-26 DIAGNOSIS — C7931 Secondary malignant neoplasm of brain: Secondary | ICD-10-CM

## 2023-05-26 DIAGNOSIS — C7951 Secondary malignant neoplasm of bone: Secondary | ICD-10-CM | POA: Diagnosis not present

## 2023-05-26 MED ORDER — ONDANSETRON 4 MG PO TBDP
ORAL_TABLET | ORAL | Status: AC
  Filled 2023-05-26: qty 1

## 2023-05-26 MED ORDER — ONDANSETRON HCL 4 MG PO TABS
4.0000 mg | ORAL_TABLET | Freq: Once | ORAL | Status: AC
  Administered 2023-05-26: 4 mg via ORAL
  Filled 2023-05-26: qty 1

## 2023-05-27 ENCOUNTER — Other Ambulatory Visit: Payer: Self-pay

## 2023-05-27 ENCOUNTER — Ambulatory Visit: Payer: BC Managed Care – PPO

## 2023-05-27 ENCOUNTER — Telehealth: Payer: Self-pay | Admitting: Radiation Oncology

## 2023-05-27 ENCOUNTER — Ambulatory Visit
Admission: RE | Admit: 2023-05-27 | Discharge: 2023-05-27 | Disposition: A | Discharge status: Home / Self Care | Payer: BC Managed Care – PPO | Source: Ambulatory Visit | Attending: Radiation Oncology | Admitting: Radiation Oncology

## 2023-05-27 DIAGNOSIS — C7951 Secondary malignant neoplasm of bone: Secondary | ICD-10-CM | POA: Diagnosis not present

## 2023-05-27 LAB — RAD ONC ARIA SESSION SUMMARY
Course Elapsed Days: 17
Plan Fractions Treated to Date: 2
Plan Fractions Treated to Date: 9
Plan Fractions Treated to Date: 9
Plan Prescribed Dose Per Fraction: 3 Gy
Plan Prescribed Dose Per Fraction: 3 Gy
Plan Prescribed Dose Per Fraction: 3 Gy
Plan Total Fractions Prescribed: 10
Plan Total Fractions Prescribed: 10
Plan Total Fractions Prescribed: 3
Plan Total Prescribed Dose: 30 Gy
Plan Total Prescribed Dose: 30 Gy
Plan Total Prescribed Dose: 9 Gy
Reference Point Dosage Given to Date: 27 Gy
Reference Point Dosage Given to Date: 27 Gy
Reference Point Dosage Given to Date: 27 Gy
Reference Point Session Dosage Given: 3 Gy
Reference Point Session Dosage Given: 3 Gy
Reference Point Session Dosage Given: 3 Gy
Session Number: 9

## 2023-05-27 NOTE — Telephone Encounter (Signed)
I spoke with the patient and she has been having nausea for a few days and two episodes of emesis. She has recently started Verzenio and is unsure if this is the source. She is also receiving radiation to the bilateral ribs and was to extend her treatment to the right pelvis to also include her left pelvis and low L spine due to findings from her recent MRI. While her left rib fields could be causing some overlap in the source of her nausea, given her known widespread breast cancer, we discussed the role of an MRI for staging of the brain. She has had a pretty traumatic week in having urgently been referred for further MRIs and ultimately 2 ED visits in order to try to get these accomplished. On top of that she had a systemic reaction to the IV contrast used at the time of her MRI, and with all this, she is understandably not excited about entertaining another MRI scan. We discussed that we could keep the MRI which was already scheduled yesterday for 06/01/23 on the schedule and to be given WITHOUT contrast, knowing lack of contrast limits the quality of the study.   Since she is having symptoms that could be related to brain disease, I think she still needs to have an MRI, but since there are also other sources that could be causing nausea ie her radiation, or her Verzenio, and since she is already taking dexamethasone 4 mg BID for her spinal disease to avoid cord compression (and would also help possible brain edema if she had brain disease), it would be reasonable to move her MRI back if she is doing better early next week. She is in agreement with this plan. She will resume her radiation plan to continue today, which would also include her bilateral ribs, and bilateral pelvis including her lumbar spine. She was encouraged to let us know if she has changes in her symptoms clinically between now and next week. She was also encouraged to increase her protein intake in her diet with hopes of improving energy and  health for her muscles.      Osker Mason, PAC

## 2023-05-28 ENCOUNTER — Other Ambulatory Visit: Payer: Self-pay

## 2023-05-28 ENCOUNTER — Encounter: Payer: Self-pay | Admitting: Radiation Oncology

## 2023-05-28 ENCOUNTER — Ambulatory Visit: Payer: BC Managed Care – PPO

## 2023-05-28 NOTE — Progress Notes (Signed)
Specialty Pharmacy Refill Coordination Note  Kayla Banks is a 67 y.o. female contacted today regarding refills of specialty medication(s) Abemaciclib   Patient requested [name] at [facility] at [place] date: 06/02/23   Medication will be filled on 06/01/23.

## 2023-05-28 NOTE — Progress Notes (Signed)
Specialty Pharmacy Ongoing Clinical Assessment Note  Kayla Banks is a 67 y.o. female who is being followed by the specialty pharmacy service for RxSp Oncology   Patient's specialty medication(s) reviewed today: Abemaciclib   Missed doses in the last 4 weeks: 2   Patient/Caregiver did not have any additional questions or concerns.   Therapeutic benefit summary: Unable to assess   Adverse events/side effects summary: Experienced adverse events/side effects (Nausea/vomiting. Taking ondansetron.)   Patient's therapy is appropriate to: Continue    Goals Addressed             This Visit's Progress    Achieve or maintain remission       Patient is  unable to assess due to recent start of therapy . Patient will maintain adherence      Slow Disease Progression       Patient is  unable to assess due to recent start of therapy. Patient will maintain adherence.          Follow up:  3 months  Otto Herb Specialty Pharmacist

## 2023-05-31 ENCOUNTER — Ambulatory Visit
Admission: RE | Admit: 2023-05-31 | Discharge: 2023-05-31 | Disposition: A | Discharge status: Home / Self Care | Payer: BC Managed Care – PPO | Source: Ambulatory Visit | Attending: Radiation Oncology

## 2023-05-31 ENCOUNTER — Other Ambulatory Visit: Payer: Self-pay

## 2023-05-31 ENCOUNTER — Encounter: Payer: Self-pay | Admitting: Radiation Oncology

## 2023-05-31 ENCOUNTER — Ambulatory Visit
Admission: RE | Admit: 2023-05-31 | Discharge: 2023-05-31 | Disposition: A | Discharge status: Home / Self Care | Payer: BC Managed Care – PPO | Source: Ambulatory Visit | Attending: Radiation Oncology | Admitting: Radiation Oncology

## 2023-05-31 DIAGNOSIS — C7951 Secondary malignant neoplasm of bone: Secondary | ICD-10-CM | POA: Diagnosis not present

## 2023-05-31 LAB — RAD ONC ARIA SESSION SUMMARY
Course Elapsed Days: 21
Plan Fractions Treated to Date: 10
Plan Fractions Treated to Date: 10
Plan Fractions Treated to Date: 3
Plan Prescribed Dose Per Fraction: 3 Gy
Plan Prescribed Dose Per Fraction: 3 Gy
Plan Prescribed Dose Per Fraction: 3 Gy
Plan Total Fractions Prescribed: 10
Plan Total Fractions Prescribed: 10
Plan Total Fractions Prescribed: 3
Plan Total Prescribed Dose: 30 Gy
Plan Total Prescribed Dose: 30 Gy
Plan Total Prescribed Dose: 9 Gy
Reference Point Dosage Given to Date: 30 Gy
Reference Point Dosage Given to Date: 30 Gy
Reference Point Dosage Given to Date: 30 Gy
Reference Point Session Dosage Given: 3 Gy
Reference Point Session Dosage Given: 3 Gy
Reference Point Session Dosage Given: 3 Gy
Session Number: 10

## 2023-05-31 NOTE — Progress Notes (Signed)
Radiation Oncology         [phone] ________________________________  Name: Kayla Banks  WRU:[id number]  Date of Service: 05/31/23  DOB: 1956/02/01   Steroid Taper Instructions   You currently have a prescription for Dexamethasone 4 mg Tablets.    Beginning 06/07/23: Take 1/2 of a tablet (which is 2 mg) twice a day  Beginning 06/14/23: Take 1/2 of a tablet (which is 2 mg) once a day  Beginning 06/21/23: Take 1/2 of a tablet (which is 2 mg) every other day and stop on 06/25/23.   Please call our office if you have any headaches, visual changes, uncontrolled movements, extremity weakness, nausea or vomiting, or progressive back pain.

## 2023-06-01 ENCOUNTER — Inpatient Hospital Stay: Admission: RE | Admit: 2023-06-01 | Payer: BC Managed Care – PPO | Source: Ambulatory Visit

## 2023-06-01 ENCOUNTER — Ambulatory Visit
Admission: RE | Admit: 2023-06-01 | Discharge: 2023-06-01 | Disposition: A | Discharge status: Home / Self Care | Payer: BC Managed Care – PPO | Source: Ambulatory Visit | Attending: Radiation Oncology | Admitting: Radiation Oncology

## 2023-06-01 ENCOUNTER — Other Ambulatory Visit: Payer: Self-pay

## 2023-06-01 ENCOUNTER — Other Ambulatory Visit: Payer: BC Managed Care – PPO

## 2023-06-01 DIAGNOSIS — C7951 Secondary malignant neoplasm of bone: Secondary | ICD-10-CM | POA: Diagnosis not present

## 2023-06-01 LAB — RAD ONC ARIA SESSION SUMMARY
Course Elapsed Days: 22
Plan Fractions Treated to Date: 1
Plan Prescribed Dose Per Fraction: 4 Gy
Plan Total Fractions Prescribed: 3
Plan Total Prescribed Dose: 12 Gy
Reference Point Dosage Given to Date: 4 Gy
Reference Point Session Dosage Given: 4 Gy
Session Number: 11

## 2023-06-02 ENCOUNTER — Ambulatory Visit: Payer: BC Managed Care – PPO

## 2023-06-02 ENCOUNTER — Ambulatory Visit
Admission: RE | Admit: 2023-06-02 | Discharge: 2023-06-02 | Disposition: A | Discharge status: Home / Self Care | Payer: BC Managed Care – PPO | Source: Ambulatory Visit | Attending: Radiation Oncology | Admitting: Radiation Oncology

## 2023-06-02 ENCOUNTER — Other Ambulatory Visit (HOSPITAL_COMMUNITY): Payer: Self-pay

## 2023-06-02 ENCOUNTER — Other Ambulatory Visit: Payer: Self-pay

## 2023-06-02 DIAGNOSIS — C7951 Secondary malignant neoplasm of bone: Secondary | ICD-10-CM | POA: Diagnosis not present

## 2023-06-02 LAB — RAD ONC ARIA SESSION SUMMARY
Course Elapsed Days: 23
Plan Fractions Treated to Date: 2
Plan Prescribed Dose Per Fraction: 4 Gy
Plan Total Fractions Prescribed: 3
Plan Total Prescribed Dose: 12 Gy
Reference Point Dosage Given to Date: 8 Gy
Reference Point Session Dosage Given: 4 Gy
Session Number: 12

## 2023-06-03 ENCOUNTER — Other Ambulatory Visit: Payer: Self-pay

## 2023-06-03 ENCOUNTER — Ambulatory Visit: Payer: BC Managed Care – PPO

## 2023-06-03 ENCOUNTER — Ambulatory Visit
Admission: RE | Admit: 2023-06-03 | Discharge: 2023-06-03 | Disposition: A | Discharge status: Home / Self Care | Payer: BC Managed Care – PPO | Source: Ambulatory Visit | Attending: Radiation Oncology | Admitting: Radiation Oncology

## 2023-06-03 DIAGNOSIS — C7951 Secondary malignant neoplasm of bone: Secondary | ICD-10-CM | POA: Diagnosis not present

## 2023-06-03 LAB — RAD ONC ARIA SESSION SUMMARY
Course Elapsed Days: 24
Plan Fractions Treated to Date: 3
Plan Prescribed Dose Per Fraction: 4 Gy
Plan Total Fractions Prescribed: 3
Plan Total Prescribed Dose: 12 Gy
Reference Point Dosage Given to Date: 12 Gy
Reference Point Session Dosage Given: 4 Gy
Session Number: 13

## 2023-06-05 NOTE — Radiation Completion Notes (Signed)
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, and possibly the lungs and mediastinal nodes     Intent: Palliative     ==========DELIVERED PLANS==========  First Treatment Date: 2023-05-10 - Last Treatment Date: 2023-06-03   Plan Name: Chest_R_Ribs Site: Ribs, Right Technique: Isodose Plan Mode: Photon Dose Per Fraction: 3 Gy Prescribed Dose (Delivered / Prescribed): 30 Gy / 30 Gy Prescribed Fxs (Delivered / Prescribed): 10 / 10   Plan Name: Chest_L_Ribs Site: Ribs, Left Technique: Isodose Plan Mode: Photon Dose Per Fraction: 3 Gy Prescribed Dose (Delivered / Prescribed): 30 Gy / 30 Gy Prescribed Fxs (Delivered / Prescribed): 10 / 10   Plan Name: Pelvis Site: Ilium, Right Technique: Isodose Plan Mode: Photon Dose Per Fraction: 3 Gy Prescribed Dose (Delivered / Prescribed): 21 Gy / 21 Gy Prescribed Fxs (Delivered / Prescribed): 7 / 7   Plan Name: Pelvis:1 Site: Ilium, Right Technique: Isodose Plan Mode: Photon Dose Per Fraction: 3 Gy Prescribed Dose (Delivered / Prescribed): 9 Gy / 9 Gy Prescribed Fxs (Delivered / Prescribed): 3 / 3   Plan Name: Pelvis_Bst Site: Pelvis Technique: Isodose Plan Mode: Photon Dose Per Fraction: 4 Gy Prescribed Dose (Delivered / Prescribed): 12 Gy / 12 Gy Prescribed Fxs (Delivered / Prescribed): 3 / 3     ==========ON TREATMENT VISIT DATES========== 2023-05-18, 2023-05-31, 2023-06-03    See weekly On Treatment Notes in Epic for details. The patient tolerated radiation. She developed fatigue and significant pain in her low pelvis and back and underwent work up of this including MRI tor rule out cord compression. Fortunately she did not have this concern with  work up, however did have treatment with steroids to see if this would help her pain. She also developed nausea and this did not seem to have a relationship to radiation. Though her systemic therapy could contribute an MRI Brain was also recommended but has yet to be completed at the request of the patient for a few weeks.  The patient will receive a call in about one month from the radiation oncology department. She will continue follow up with Dr. Pamelia Hoit as well.      Osker Mason, PAC

## 2023-06-07 ENCOUNTER — Encounter: Payer: Self-pay | Admitting: Radiation Oncology

## 2023-06-07 ENCOUNTER — Telehealth: Payer: Self-pay | Admitting: Radiation Therapy

## 2023-06-07 ENCOUNTER — Telehealth: Payer: Self-pay | Admitting: Adult Health

## 2023-06-07 ENCOUNTER — Inpatient Hospital Stay: Payer: BC Managed Care – PPO | Admitting: Adult Health

## 2023-06-07 ENCOUNTER — Inpatient Hospital Stay: Payer: BC Managed Care – PPO

## 2023-06-07 NOTE — Progress Notes (Deleted)
[facility] Cancer Follow up:    Donato Schultz, DO [address] [place] [address]   DIAGNOSIS:  Cancer Staging  Malignant neoplasm of upper-outer quadrant of left breast in female, estrogen receptor positive (HCC) Staging form: Breast, AJCC 8th Edition - Clinical: Stage IB (cT1b, cN1(f), cM0, G2, ER+, PR+, HER2-) - Signed by Serena Croissant, MD on 08/31/2018 Method of lymph node assessment: Core biopsy Histologic grading system: 3 grade system Laterality: Left - Pathologic stage from 10/18/2018: Stage IB (pT1b, pN2a, cM0, G2, ER+, PR+, HER2-) - Signed by Serena Croissant, MD on 11/14/2018 Neoadjuvant therapy: No Method of lymph node assessment: Axillary lymph node dissection Histologic grading system: 3 grade system Laterality: Left Tumor size (mm): 10   SUMMARY OF ONCOLOGIC HISTORY: Oncology History  Malignant neoplasm of upper-outer quadrant of left breast in female, estrogen receptor positive (HCC)  08/24/2018 Initial Biopsy   Screening detected left breast mass at 2:30 position 6 mm in size with one abnormal lymph node and stable benign calcifications, biopsy of the mass grade 2 IDC with DCIS and lymphovascular invasion, lymph node biopsy is positive, ER 95%, PR 40%, Ki-67 20%, HER-2 -1+ by IHC, T1b N1 stage Ib   08/31/2018 Cancer Staging   Staging form: Breast, AJCC 8th Edition - Clinical: Stage IB (cT1b, cN1(f), cM0, G2, ER+, PR+, HER2-) - Signed by Serena Croissant, MD on 08/31/2018   09/21/2018 Genetic Testing   Genetic testing performed through [facility]'s Common Hereditary Cancers Panel reported out on 09/19/2018 showed no pathogenic mutations.  The Common Hereditary Cancer Panel offered by [facility] includes sequencing and/or deletion duplication testing of the following 53 genes: APC, ATM, AXIN2, BARD1, BMPR1A, BRCA1, BRCA2, BRIP1, BUB1B, CDH1, CDK4, CDKN2A, CHEK2, CTNNA1, DICER1, ENG, EPCAM, GALNT12, GREM1, HOXB13, KIT, MEN1, MLH1, MLH3, MSH2, MSH3,  MSH6, MUTYH, NBN, NF1, NTHL1, PALB2, PDGFRA, PMS2, POLD1, POLE, PTEN, RAD50, RAD51C, RAD51D, RNF43, RPS20, SDHA, SDHB, SDHC, SDHD, SMAD4, SMARCA4, STK11, TP53, TSC1, TSC2, VHL.   A variant of uncertain significance (VUS) in a gene called NBN was also noted. c.1979G>C (p.Arg660Thr)   10/18/2018 Cancer Staging   Staging form: Breast, AJCC 8th Edition - Pathologic stage from 10/18/2018: Stage IB (pT1b, pN2a, cM0, G2, ER+, PR+, HER2-) - Signed by Serena Croissant, MD on 11/14/2018   11/09/2018 Surgery   Left lumpectomy ([facility]) [id number]): Grade 2 IDC with DCIS, 1 cm, margins negative, DCIS focally 0.1 cm from superior margin, 2/2 lymph nodes positive, ER 95%, PR 45%, HER-2 negative, Ki-67 20% AXLND: 3/20 LN Pos (total 5/22)   12/15/2018 -  Chemotherapy   DOXOrubicin (ADRIAMYCIN) chemo injection 142 mg, 60 mg/m2 = 142 mg, Intravenous,  Once, 4 of 4 cycles. Administration: 142 mg (12/15/2018), 142 mg (12/29/2018), 142 mg (01/12/2019), 142 mg (01/26/2019)  palonosetron (ALOXI) injection 0.25 mg, 0.25 mg, Intravenous,  Once, 4 of 4 cycles. Administration: 0.25 mg (12/15/2018), 0.25 mg (12/29/2018), 0.25 mg (01/12/2019), 0.25 mg (01/26/2019)  pegfilgrastim-cbqv (UDENYCA) injection 6 mg, 6 mg, Subcutaneous, Once, 4 of 4 cycles. Administration: 6 mg (12/17/2018), 6 mg (12/31/2018), 6 mg (01/14/2019), 6 mg (01/28/2019)  cyclophosphamide (CYTOXAN) 1,420 mg in sodium chloride 0.9 % 250 mL chemo infusion, 600 mg/m2 = 1,420 mg, Intravenous,  Once, 4 of 4 cycles. Administration: 1,420 mg (12/15/2018), 1,420 mg (12/29/2018), 1,420 mg (01/12/2019), 1,420 mg (01/26/2019)  PACLitaxel (TAXOL) 192 mg in sodium chloride 0.9 % 250 mL chemo infusion (</= 80mg /m2), 80 mg/m2 = 192 mg, Intravenous,  Once, 11 of 12  cycles. Dose modification: 60 mg/m2 (original dose 80 mg/m2, Cycle 12, Reason: Dose not tolerated). Administration: 192 mg (02/09/2019), 192 mg (02/16/2019), 192 mg (02/23/2019), 192 mg (03/02/2019), 192 mg (03/09/2019), 192 mg (03/16/2019),  192 mg (03/23/2019), 144 mg (03/30/2019), 144 mg (04/06/2019), 144 mg (04/14/2019), 144 mg (04/20/2019)  fosaprepitant (EMEND) 150 mg  dexamethasone (DECADRON) 12 mg in sodium chloride 0.9 % 145 mL IVPB, , Intravenous,  Once, 4 of 4 cycles Administration:  (12/15/2018),  (12/29/2018),  (01/12/2019),  (01/26/2019)     06/01/2019 - 07/21/2019 Radiation Therapy   The patient initially received a dose of 50.4 Gy in 28 fractions to the breast using whole-breast tangent fields. This was delivered using a 3-D conformal technique. The pt received a boost delivering an additional 10 Gy in 5 fractions using a electron boost with electrons. The total dose was 60.4 Gy.    08/2019 - 04/2023 Anti-estrogen oral therapy   Anastrozole   04/02/2023 Relapse/Recurrence   CT CAP 04/02/2023: Small bilateral pleural effusions, multiple small bilateral pulmonary nodules measuring up to 5 mm, right paratracheal and right hilar lymph nodes indeterminate.  Numerous lytic lesions in axial and appendical skeleton concerning for metastatic disease or myeloma.  Possible acute left seventh eighth ninth rib fractures hypodensities within the liver subcentimeter     04/30/2023 PET scan   PET/CT scan 04/30/2023: Widespread bone metastases cervical thoracic lumbar spine sternum ribs shoulders and pelvis, hypermetabolic right paratracheal and right hilar lymphadenopathy, tiny pulmonary nodules: Patient has appointment to see Dr. Tonia Brooms for bronchoscopy and biopsy.   05/07/2023 Treatment Plan Change   Letrozole daily, Caffie Damme   05/10/2023 - 06/03/2023 Radiation Therapy   Plan Name: Chest_R_Ribs Site: Ribs, Right Technique: Isodose Plan Mode: Photon Dose Per Fraction: 3 Gy Prescribed Dose (Delivered / Prescribed): 30 Gy / 30 Gy Prescribed Fxs (Delivered / Prescribed): 10 / 10   Plan Name: Chest_L_Ribs Site: Ribs, Left Technique: Isodose Plan Mode: Photon Dose Per Fraction: 3 Gy Prescribed Dose (Delivered / Prescribed): 30  Gy / 30 Gy Prescribed Fxs (Delivered / Prescribed): 10 / 10   Plan Name: Pelvis Site: Ilium, Right Technique: Isodose Plan Mode: Photon Dose Per Fraction: 3 Gy Prescribed Dose (Delivered / Prescribed): Banks Gy / Banks Gy Prescribed Fxs (Delivered / Prescribed): 7 / 7   Plan Name: Pelvis:1 Site: Ilium, Right Technique: Isodose Plan Mode: Photon Dose Per Fraction: 3 Gy Prescribed Dose (Delivered / Prescribed): 9 Gy / 9 Gy Prescribed Fxs (Delivered / Prescribed): 3 / 3   Plan Name: Pelvis_Bst Site: Pelvis Technique: Isodose Plan Mode: Photon Dose Per Fraction: 4 Gy Prescribed Dose (Delivered / Prescribed): 12 Gy / 12 Gy Prescribed Fxs (Delivered / Prescribed): 3 / 3       CURRENT THERAPY: Letrozole/Verzenio/Xgeva  INTERVAL HISTORY: Kayla Banks 67 y.o. female returns for    Patient Active Problem List   Diagnosis Date Noted   Adenopathy 05/10/2023   Metastasis to bone (HCC) 05/05/2023   Chemotherapy-induced peripheral neuropathy (HCC) 03/30/2019   Port-A-Cath in place 12/15/2018   Genetic testing 09/21/2018   Family history of breast cancer    Malignant neoplasm of upper-outer quadrant of left breast in female, estrogen receptor positive (HCC) 08/29/2018   Hyperlipidemia 08/04/2018   Obesity (BMI 30-39.9) 05/09/2013   Preventative health care 11/19/2010   URI (upper respiratory infection) 11/19/2010   KNEE PAIN, BILATERAL 09/25/2010   SINUSITIS - ACUTE-NOS 12/24/2009   MORBID OBESITY 06/14/2009   COLD SORE 10/16/2008   SKIN TAG 06/18/2008  Hypothyroidism 03/17/2007   Essential hypertension 03/17/2007    is allergic to gadolinium derivatives, erythromycin, lisinopril, penicillins, and adhesive [tape].  MEDICAL HISTORY: Past Medical History:  Diagnosis Date   Breast cancer (HCC)    Complication of anesthesia    Family history of breast cancer    Heart murmur    no problems, saw cardiologist 2010   Hypertension    on meds   Hypothyroidism     Osteoarthritis    Personal history of chemotherapy    Personal history of radiation therapy    left   Plantar fasciitis    PONV (postoperative nausea and vomiting)    Thyroid disease    Hypothyroidism    SURGICAL HISTORY: Past Surgical History:  Procedure Laterality Date   ABDOMINAL HYSTERECTOMY  02/2012   AXILLARY LYMPH NODE DISSECTION Left 11/09/2018   Procedure: LEFT AXILLARY LYMPH NODE DISSECTION;  Surgeon: Harriette Bouillon, MD;  Location: [facility];  Service: General;  Laterality: Left;   BREAST BIOPSY Left    malignant   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/11/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   BREAST LUMPECTOMY WITH RADIOACTIVE SEED AND SENTINEL LYMPH NODE BIOPSY Left 10/11/2018   Procedure: LEFT BREAST LUMPECTOMY WITH RADIOACTIVE SEED AND LEFT SENTINEL LYMPH NODE MAPPING WITH LEFT TARGETED LYMPH NODE BIOPSY;  Surgeon: Harriette Bouillon, MD;  Location: [facility];  Service: General;  Laterality: Left;   BRONCHIAL NEEDLE ASPIRATION BIOPSY  05/13/2023   Procedure: BRONCHIAL NEEDLE ASPIRATION BIOPSIES;  Surgeon: Lorin Glass, MD;  Location: [facility] ENDOSCOPY;  Service: Pulmonary;;   CATARACT EXTRACTION Bilateral 11/3  11/10/Banks   shipiro    FOOT SURGERY Left    bone spurs   LUMBAR LAMINECTOMY  1990   PORT-A-CATH REMOVAL N/A 05/23/2019   Procedure: PORT REMOVAL;  Surgeon: Harriette Bouillon, MD;  Location: [facility];  Service: General;  Laterality: N/A;   PORTACATH PLACEMENT Right 12/01/2018   Procedure: INSERTION PORT-A-CATH WITH ULTRASOUND;  Surgeon: Harriette Bouillon, MD;  Location: [facility];  Service: General;  Laterality: Right;   VIDEO BRONCHOSCOPY WITH ENDOBRONCHIAL ULTRASOUND Bilateral 05/13/2023   Procedure: VIDEO BRONCHOSCOPY WITH ENDOBRONCHIAL ULTRASOUND;  Surgeon: Lorin Glass, MD;  Location: [facility];  Service: Pulmonary;  Laterality: Bilateral;    SOCIAL HISTORY: Social History   Socioeconomic History    Marital status: Single    Spouse name: Not on file   Number of children: Not on file   Years of education: Not on file   Highest education level: Master's degree (e.g., MA, MS, MEng, MEd, MSW, MBA)  Occupational History   Occupation: [employer]  Tobacco Use   Smoking status: Never   Smokeless tobacco: Never  Vaping Use   Vaping status: Never Used  Substance and Sexual Activity   Alcohol use: Not Currently    Comment: occassionally   Drug use: Never   Sexual activity: Not Currently    Partners: Male  Other Topics Concern   Not on file  Social History Narrative   Exercise-- no   Social Determinants of Health   Financial Resource Strain: Low Risk  (04/02/2023)   Overall Financial Resource Strain (CARDIA)    Difficulty of Paying Living Expenses: Not very hard  Food Insecurity: No Food Insecurity (04/02/2023)   Hunger Vital Sign    Worried About Running Out of Food in the Last Year: Never true    Ran Out of Food in the Last  Year: Never true  Transportation Needs: No Transportation Needs (04/02/2023)   PRAPARE - Administrator, Civil Service (Medical): No    Lack of Transportation (Non-Medical): No  Physical Activity: Unknown (04/02/2023)   Exercise Vital Sign    Days of Exercise per Week: 0 days    Minutes of Exercise per Session: Not on file  Stress: Stress Concern Present (04/02/2023)   [employer] of Occupational Health - Occupational Stress Questionnaire    Feeling of Stress : To some extent  Social Connections: Moderately Integrated (04/02/2023)   Social Connection and Isolation Panel [NHANES]    Frequency of Communication with Friends and Family: Once a week    Frequency of Social Gatherings with Friends and Family: More than three times a week    Attends Religious Services: More than 4 times per year    Active Member of [employer] or Organizations: Yes    Attends Engineer, structural: More than 4 times per year    Marital Status: Never  married  Catering manager Violence: Not on file    FAMILY HISTORY: Family History  Problem Relation Age of Onset   Diabetes Mother    Hyperlipidemia Mother    Hypertension Mother    Hypertension Father    Heart disease Father 66       ?MI   Hypothyroidism Sister    Hyperthyroidism Sister    Breast cancer Maternal Aunt        dx 60's/70's   Breast cancer Other        dx early 43's    Review of Systems - Oncology    PHYSICAL EXAMINATION    There were no vitals filed for this visit.  Physical Exam  LABORATORY DATA:  CBC    Component Value Date/Time   WBC 3.6 (L) 05/21/2023 1047   RBC 3.94 05/21/2023 1047   HGB 10.8 (L) 05/21/2023 1047   HGB 12.4 05/17/2023 0903   HCT 35.3 (L) 05/21/2023 1047   PLT 161 05/21/2023 1047   PLT 245 05/17/2023 0903   MCV 89.6 05/21/2023 1047   MCH 27.4 05/21/2023 1047   MCHC 30.6 05/21/2023 1047   RDW 15.5 05/21/2023 1047   LYMPHSABS 0.4 (L) 05/21/2023 1047   MONOABS 0.1 05/21/2023 1047   EOSABS 0.1 05/21/2023 1047   BASOSABS 0.0 05/21/2023 1047    CMP     Component Value Date/Time   NA 141 05/21/2023 1047   K 4.0 05/21/2023 1047   CL 108 05/21/2023 1047   CO2 26 05/21/2023 1047   GLUCOSE 115 (H) 05/21/2023 1047   BUN 20 05/21/2023 1047   CREATININE 1.16 (H) 05/21/2023 1047   CREATININE 1.48 (H) 05/17/2023 0903   CREATININE 0.94 04/09/2023 1413   CALCIUM 8.0 (L) 05/21/2023 1047   PROT 7.4 05/17/2023 0903   ALBUMIN 3.7 05/17/2023 0903   AST 18 05/17/2023 0903   ALT 11 05/17/2023 0903   ALKPHOS 128 (H) 05/17/2023 0903   BILITOT 1.1 05/17/2023 0903   GFRNONAA 52 (L) 05/21/2023 1047   GFRNONAA 39 (L) 05/17/2023 0903   GFRAA >60 05/19/2019 1200   GFRAA >60 04/27/2019 1323       PENDING LABS:   RADIOGRAPHIC STUDIES:  No results found.   PATHOLOGY:     ASSESSMENT and THERAPY PLAN:   No problem-specific Assessment & Plan notes found for this encounter.   No orders of the defined types were placed in  this encounter.   All questions were answered. The  patient knows to call the clinic with any problems, questions or concerns. We can certainly see the patient much sooner if necessary. This note was electronically signed. Noreene Filbert, NP 10/Banks/2024

## 2023-06-07 NOTE — Telephone Encounter (Signed)
 Left patient a vm regarding upcoming appointment

## 2023-06-07 NOTE — Progress Notes (Signed)
I left a message today for the patient trying to clarify the rationale for having an additional MRI of the brain, as we have discussed before she has had symptoms that are concerning for persistent side effects not felt to be the direct result of radiation, but possibly with some overlap to her systemic therapy.  I encouraged her to consider having an MRI scan which has been put off at her request sooner than several weeks from now as we had originally discussed.  Medical oncology has also been included in conversations and are in agreement with proceeding.

## 2023-06-07 NOTE — Telephone Encounter (Signed)
Per Kayla Banks's request, I reached back out to Kayla Banks to see if Kayla Banks would like to proceed with the brain MRI ordered for her. Kayla Banks is still having issues with nausea which could be caused by her systemic treatment but not from the recent radiation course. We discussed the reasoning for getting a brain MRI as Kayla Banks did not understand why it was recommended. Based on our conversation and explanation Kayla Banks agreed to proceed with the scan. Kayla Banks is going to call [facility] back to schedule in the next week or two.   Kayla Banks R.T.(R)(T) Radiation Special Procedures Navigator

## 2023-06-09 LAB — CYTOLOGY - NON PAP

## 2023-06-10 ENCOUNTER — Inpatient Hospital Stay: Payer: BC Managed Care – PPO | Attending: Hematology and Oncology

## 2023-06-10 ENCOUNTER — Other Ambulatory Visit (HOSPITAL_COMMUNITY): Payer: Self-pay

## 2023-06-10 ENCOUNTER — Inpatient Hospital Stay: Payer: BC Managed Care – PPO | Admitting: Pharmacist

## 2023-06-10 VITALS — BP 121/71 | HR 113 | Temp 97.3°F | Resp 18 | Ht 66.0 in | Wt 221.1 lb

## 2023-06-10 DIAGNOSIS — Z170 Estrogen receptor positive status [ER+]: Secondary | ICD-10-CM

## 2023-06-10 DIAGNOSIS — Z79811 Long term (current) use of aromatase inhibitors: Secondary | ICD-10-CM | POA: Insufficient documentation

## 2023-06-10 DIAGNOSIS — C50412 Malignant neoplasm of upper-outer quadrant of left female breast: Secondary | ICD-10-CM | POA: Diagnosis present

## 2023-06-10 LAB — CMP (CANCER CENTER ONLY)
ALT: 24 U/L (ref 0–44)
AST: 23 U/L (ref 15–41)
Albumin: 3 g/dL — ABNORMAL LOW (ref 3.5–5.0)
Alkaline Phosphatase: 152 U/L — ABNORMAL HIGH (ref 38–126)
Anion gap: 10 (ref 5–15)
BUN: 15 mg/dL (ref 8–23)
CO2: 25 mmol/L (ref 22–32)
Calcium: 8.3 mg/dL — ABNORMAL LOW (ref 8.9–10.3)
Chloride: 105 mmol/L (ref 98–111)
Creatinine: 1.02 mg/dL — ABNORMAL HIGH (ref 0.44–1.00)
GFR, Estimated: >60 mL/min (ref >60)
Glucose, Bld: 102 mg/dL — ABNORMAL HIGH (ref 70–99)
Potassium: 3.6 mmol/L (ref 3.5–5.1)
Sodium: 140 mmol/L (ref 135–145)
Total Bilirubin: 1.4 mg/dL — ABNORMAL HIGH (ref 0.3–1.2)
Total Protein: 6.1 g/dL — ABNORMAL LOW (ref 6.5–8.1)

## 2023-06-10 LAB — CBC WITH DIFFERENTIAL (CANCER CENTER ONLY)
Abs Immature Granulocytes: 0.01 10*3/uL (ref 0.00–0.07)
Basophils Absolute: 0 10*3/uL (ref 0.0–0.1)
Basophils Relative: 1 %
Eosinophils Absolute: 0 10*3/uL (ref 0.0–0.5)
Eosinophils Relative: 1 %
HCT: 37.6 % (ref 36.0–46.0)
Hemoglobin: 12.9 g/dL (ref 12.0–15.0)
Immature Granulocytes: 1 %
Lymphocytes Relative: 5 %
Lymphs Abs: 0.1 10*3/uL — ABNORMAL LOW (ref 0.7–4.0)
MCH: 28.9 pg (ref 26.0–34.0)
MCHC: 34.3 g/dL (ref 30.0–36.0)
MCV: 84.3 fL (ref 80.0–100.0)
Monocytes Absolute: 0.1 10*3/uL (ref 0.1–1.0)
Monocytes Relative: 3 %
Neutro Abs: 1.7 10*3/uL (ref 1.7–7.7)
Neutrophils Relative %: 89 %
Platelet Count: 63 10*3/uL — ABNORMAL LOW (ref 150–400)
RBC: 4.46 MIL/uL (ref 3.87–5.11)
RDW: 18.7 % — ABNORMAL HIGH (ref 11.5–15.5)
WBC Count: 1.9 10*3/uL — ABNORMAL LOW (ref 4.0–10.5)
nRBC: 0 % (ref 0.0–0.2)

## 2023-06-10 MED ORDER — PROCHLORPERAZINE MALEATE 10 MG PO TABS
10.0000 mg | ORAL_TABLET | Freq: Four times a day (QID) | ORAL | 2 refills | Status: DC | PRN
  Filled 2023-06-10: qty 30, 8d supply, fill #0

## 2023-06-10 MED ORDER — ABEMACICLIB 50 MG PO TABS
50.0000 mg | ORAL_TABLET | Freq: Two times a day (BID) | ORAL | 5 refills | Status: DC
  Filled 2023-06-10 – 2023-06-21 (×2): qty 56, 28d supply, fill #0
  Filled 2023-07-13: qty 56, 28d supply, fill #1
  Filled 2023-08-26: qty 56, 28d supply, fill #2
  Filled 2023-11-03: qty 56, 28d supply, fill #3
  Filled 2023-12-17: qty 56, 28d supply, fill #4
  Filled 2024-01-28: qty 56, 28d supply, fill #5

## 2023-06-10 NOTE — Progress Notes (Signed)
[facility]       Telephone: [phone]?Fax: [phone]   Oncology Clinical Pharmacist Practitioner Progress Note  Kayla Banks was contacted via in-person to discuss her chemotherapy regimen for abemaciclib which they receive under the care of Dr. Serena Croissant. She is accompanied by her mother.  Current treatment regimen and start date Abemaciclib (05/10/23) 50 mg BID (TBD) 100 mg BID (05/10/23) Letrozole (04/1723) Denosumab 120 mg (05/17/23)  Interval History She continues on abemaciclib 100 mg by mouth every 12 hours on days 1 to 28 of a 28-day cycle. This is being given in combination with letrozole and denosumab 120 mg . Therapy is planned to continue until disease progression or unacceptable toxicity. She finished xrt on 06/03/23 and was seen in ED on 05/21/23 for weakness  Response to Therapy Kayla Banks is not tolerating her current dose of abemaciclib. Her weakness, lack of appetite, and nausea with occasional vomiting may also be due to the radiation. She is not showing signs of dehydration today but she feels fatigued. She has also had a 16 lb weight loss in two weeks which is likely due to her not having an appetite. Her platelets are also reduced today in clinic and her bilirubin is slightly elevated. Her corrected calcium is estimated at 9.1 mg/dL after adjusting for low albumin. Because of the above, she will hold abemaciclib (she did not take her morning dose today) and we will recheck her labs in one week. We have also discontinued her 100 mg BID dose and sent for a lower abemaciclib dose at 50 mg BID. While the ondansetron is working fairly well, we have also sent a PRN prescription for prochlorperazine. Kayla Banks was in agreement with this plan. She will likely be seen at least every 2 weeks for the foreseeable future. Labs, vitals, treatment parameters, and manufacturer guidelines assessing toxicity were reviewed with Kayla Banks today. Based on these  values, patient is in agreement to HOLD abemaciclib therapy at this time.  Allergies Allergies  Allergen Reactions   Gadolinium Derivatives Anaphylaxis    MRI gadolinium contrast anaphylaxis with hives, facial swelling, wheezing, SOB, hypoxia   Erythromycin Swelling    Lip swelling; angioedema   Lisinopril Swelling    REACTION: angioedema   Penicillins Hives and Swelling    Swelling in arms & hands   Adhesive [Tape] Rash    Vitals    06/10/2023    1:12 PM 05/25/2023   11:37 AM 05/21/2023    9:00 PM  Oncology Vitals  Height 168 cm 168 cm   Weight 100.29 kg 107.775 kg   Weight (lbs) 221 lbs 2 oz 237 lbs 10 oz   BMI 35.69 kg/m2 38.35 kg/m2   Temp 97.3 F (36.3 C) 98.3 F (36.8 C)   Pulse Rate 113 64 69  BP 121/71 94/50 110/83  Resp 18    SpO2 100 % 98 % 98 %  BSA (m2) 2.16 m2 2.24 m2     Laboratory Data    Latest Ref Rng & Units 06/10/2023   12:28 PM 05/21/2023   10:47 AM 05/20/2023    6:53 PM  CBC EXTENDED  WBC 4.0 - 10.5 K/uL 1.9  3.6  4.9   RBC 3.87 - 5.11 MIL/uL 4.46  3.94  4.03   Hemoglobin 12.0 - 15.0 g/dL 29.5  62.1  30.8   HCT 36.0 - 46.0 % 37.6  35.3  36.5   Platelets 150 - 400 K/uL 63  161  201  NEUT# 1.7 - 7.7 K/uL 1.7  3.0  4.2   Lymph# 0.7 - 4.0 K/uL 0.1  0.4  0.3        Latest Ref Rng & Units 06/10/2023   12:28 PM 05/21/2023   10:47 AM 05/20/2023    6:53 PM  CMP  Glucose 70 - 99 mg/dL 784  696  295   BUN 8 - 23 mg/dL 15  20  19    Creatinine 0.44 - 1.00 mg/dL 2.84  1.32  4.40   Sodium 135 - 145 mmol/L 140  141  141   Potassium 3.5 - 5.1 mmol/L 3.6  4.0  4.0   Chloride 98 - 111 mmol/L 105  108  108   CO2 22 - 32 mmol/L 25  26  26    Calcium 8.9 - 10.3 mg/dL 8.3  8.0  8.3   Total Protein 6.5 - 8.1 g/dL 6.1     Total Bilirubin 0.3 - 1.2 mg/dL 1.4     Alkaline Phos 38 - 126 U/L 152     AST 15 - 41 U/L 23     ALT 0 - 44 U/L 24      Adverse Effects Assessment Platelets: reduced -- does state she has some light bleeding when brushing her teeth but  no other signs of bleeding. Hemoglobin improved. Will monitor Creatinine: improved -- she is doing a great job with getting in fluids despite having several episodes of vomiting. She has been using ondansetron and we have prescribed prochlorperazine PRN today Calcium: improved to estimated 9.1 mg/dL after adjusting for albumin Alk Phos elevated: will monitor Bilirubin elevated: will monitor  Adherence Assessment Kayla Banks reports missing 1 doses over the past 4 weeks.   Reason for missed dose: not feeling well Patient was re-educated on importance of adherence.   Access Assessment Kayla Banks is currently receiving her abemaciclib through [facility] concerns:  none  Medication Reconciliation The patient's medication list was reviewed today with the patient? Yes New medications or herbal supplements have recently been started?  No, we are sending prochlorperazine today. She is also on a steroid taper Any medications have been discontinued? No  The medication list was updated and reconciled based on the patient's most recent medication list in the electronic medical record (EMR) including herbal products and OTC medications.   Medications Current Outpatient Medications  Medication Sig Dispense Refill   abemaciclib (VERZENIO) 50 MG tablet Take 1 tablet (50 mg total) by mouth 2 (two) times daily. Swallow tablets whole. Do not chew, crush, or split tablets before swallowing. 56 tablet 5   prochlorperazine (COMPAZINE) 10 MG tablet Take 1 tablet (10 mg total) by mouth every 6 (six) hours as needed for nausea or vomiting. 30 tablet 2   amLODipine (NORVASC) 5 MG tablet Take 1 tablet (5 mg total) by mouth daily. Needs ov before any more refills 90 tablet 1   carvedilol (COREG) 25 MG tablet Take 1 tablet (25 mg total) by mouth 2 (two) times daily with a meal. Due for appt 03/2023 180 tablet 1   dexamethasone (DECADRON) 4 MG tablet Take 1 tablet (4 mg total)  by mouth 2 (two) times daily. 60 tablet 1   EPINEPHRINE 0.3 mg/0.3 mL IJ SOAJ injection INJECT 0.3 ML(1 SYRINGE) IN THE MUSCLE AS NEEDED FOR ALLERGIC REACTION 2 Device 0   gabapentin (NEURONTIN) 300 MG capsule Take 1 capsule (300 mg total) by mouth 2 (two) times daily. (Patient taking differently: Take  300 mg by mouth 2 (two) times daily.) 180 capsule 1   HYDROcodone-acetaminophen (NORCO) 5-325 MG tablet Take 1-2 tablets by mouth every 4 (four) hours as needed. 60 tablet 0   letrozole (FEMARA) 2.5 MG tablet Take 1 tablet (2.5 mg total) by mouth daily. 90 tablet 3   meloxicam (MOBIC) 15 MG tablet Take 1 tablet (15 mg total) by mouth daily as needed for pain. (Patient taking differently: Take 15 mg by mouth daily as needed for pain.) 90 tablet 1   ondansetron (ZOFRAN) 8 MG tablet 1/2 to 1 tablet q 12 hours prn nausea 20 tablet 3   SYNTHROID 100 MCG tablet Take 1 tablet (100 mcg total) by mouth daily before breakfast. 90 tablet 1   valACYclovir (VALTREX) 1000 MG tablet TAKE 1 TABLET BY MOUTH THREE TIMES DAILY AS NEEDED (Patient taking differently: Take 1,000 mg by mouth as needed. Takes 1 tablet by mouth daily as needed for cold sores) 30 tablet 2   No current facility-administered medications for this visit.    Drug-Drug Interactions (DDIs) DDIs were evaluated? Yes Significant DDIs? No  The patient was instructed to speak with their health care provider and/or the oral chemotherapy pharmacist before starting any new drug, including prescription or over the counter, natural / herbal products, or vitamins.  Supportive Care Diarrhea: we reviewed that diarrhea is common with abemaciclib and confirmed that she does have loperamide (Imodium) at home.  We reviewed how to take this medication PRN. Neutropenia: we discussed the importance of having a thermometer and what the Centers for Disease Control and Prevention (CDC) considers a fever which is 100.83F (38C) or higher.  Gave patient 24/7 triage line  to call if any fevers or symptoms. ILD/Pneumonitis: we reviewed potential symptoms including cough, shortness, and fatigue.  VTE: reviewed signs of DVT such as leg swelling, redness, pain, or tenderness and signs of PE such as shortness of breath, rapid or irregular heartbeat, cough, chest pain, or lightheadedness. Reviewed to take the medication every 12 hours (with food sometimes can be easier on the stomach) and to take it at the same time every day. Hepatotoxicity:bilirubin slightly elevated and Alk Phos. We are holding abemaciclib for one week and will resume at lower dose if labs improve Drug interactions with grapefruit products Monitor weight and loss of appetite. Could be multifactorial with abemaciclib and xrt being likely contributors. Xrt is now finished and she will hold abemaciclib for one week and then lower the dose.  Dosing Assessment Hepatic adjustments needed? No  Renal adjustments needed? No  Toxicity adjustments needed? Yes  The current dosing regimen is not appropriate to continue at this time.  Follow-Up Plan STOP abemaciclib 100 mg by mouth every 12 hours. Sending lower dose of 50 mg by mouth every 12 hours to [facility]. She will start this next Friday if labs improve and feeling better. She will see clinical pharmacy with labs next Thursday Continue letrozole 2.5 mg by mouth daily Continue denosumab 120 mg SubQ every 12 weeks. Next due 08/09/23 Monitor bilirubin, calcium, platelets, Alk Phos, weight, appetitite, N/V -- use prochlorperazine (sent today) PRN and ondansetron PRN Will add labs, pharmacy clinic visit for 06/17/23. Possibly restart abemaciclib that next day Will add labs, Dr. Pamelia Hoit visit for 07/01/23 Kayla Banks will call if symptoms do not improve.  Kayla Banks participated in the discussion, expressed understanding, and voiced agreement with the above plan. All questions were answered to her satisfaction. The patient was advised to contact the clinic at  [phone] with any questions or concerns prior to her return visit.   I spent 30 minutes assessing and educating the patient.  Demecia Northway A. Odetta Pink, PharmD, BCOP, CPP  Anselm Lis, RPH-CPP, 06/10/2023  1:40 PM   **Disclaimer: This note was dictated with voice recognition software. Similar sounding words can inadvertently be transcribed and this note may contain transcription errors which may not have been corrected upon publication of note.**

## 2023-06-11 ENCOUNTER — Telehealth: Payer: Self-pay | Admitting: Pharmacist

## 2023-06-15 ENCOUNTER — Other Ambulatory Visit (HOSPITAL_COMMUNITY): Payer: Self-pay

## 2023-06-16 ENCOUNTER — Other Ambulatory Visit: Payer: Self-pay

## 2023-06-16 ENCOUNTER — Other Ambulatory Visit (HOSPITAL_COMMUNITY): Payer: Self-pay

## 2023-06-16 ENCOUNTER — Inpatient Hospital Stay: Payer: BC Managed Care – PPO | Admitting: Pharmacist

## 2023-06-16 ENCOUNTER — Inpatient Hospital Stay: Payer: BC Managed Care – PPO

## 2023-06-16 VITALS — BP 119/70 | HR 94 | Temp 97.0°F | Resp 18 | Wt 222.8 lb

## 2023-06-16 DIAGNOSIS — C50412 Malignant neoplasm of upper-outer quadrant of left female breast: Secondary | ICD-10-CM

## 2023-06-16 LAB — CBC WITH DIFFERENTIAL (CANCER CENTER ONLY)
Abs Immature Granulocytes: 0.02 10*3/uL (ref 0.00–0.07)
Basophils Absolute: 0 10*3/uL (ref 0.0–0.1)
Basophils Relative: 1 %
Eosinophils Absolute: 0 10*3/uL (ref 0.0–0.5)
Eosinophils Relative: 1 %
HCT: 34.9 % — ABNORMAL LOW (ref 36.0–46.0)
Hemoglobin: 11.9 g/dL — ABNORMAL LOW (ref 12.0–15.0)
Immature Granulocytes: 1 %
Lymphocytes Relative: 6 %
Lymphs Abs: 0.1 10*3/uL — ABNORMAL LOW (ref 0.7–4.0)
MCH: 28.9 pg (ref 26.0–34.0)
MCHC: 34.1 g/dL (ref 30.0–36.0)
MCV: 84.7 fL (ref 80.0–100.0)
Monocytes Absolute: 0.1 10*3/uL (ref 0.1–1.0)
Monocytes Relative: 5 %
Neutro Abs: 1.9 10*3/uL (ref 1.7–7.7)
Neutrophils Relative %: 86 %
Platelet Count: 102 10*3/uL — ABNORMAL LOW (ref 150–400)
RBC: 4.12 MIL/uL (ref 3.87–5.11)
RDW: 19.4 % — ABNORMAL HIGH (ref 11.5–15.5)
WBC Count: 2.2 10*3/uL — ABNORMAL LOW (ref 4.0–10.5)
nRBC: 0 % (ref 0.0–0.2)

## 2023-06-16 LAB — CMP (CANCER CENTER ONLY)
ALT: 18 U/L (ref 0–44)
AST: 17 U/L (ref 15–41)
Albumin: 3.3 g/dL — ABNORMAL LOW (ref 3.5–5.0)
Alkaline Phosphatase: 208 U/L — ABNORMAL HIGH (ref 38–126)
Anion gap: 9 (ref 5–15)
BUN: 10 mg/dL (ref 8–23)
CO2: 27 mmol/L (ref 22–32)
Calcium: 8.2 mg/dL — ABNORMAL LOW (ref 8.9–10.3)
Chloride: 105 mmol/L (ref 98–111)
Creatinine: 0.76 mg/dL (ref 0.44–1.00)
GFR, Estimated: >60 mL/min (ref >60)
Glucose, Bld: 110 mg/dL — ABNORMAL HIGH (ref 70–99)
Potassium: 3.4 mmol/L — ABNORMAL LOW (ref 3.5–5.1)
Sodium: 141 mmol/L (ref 135–145)
Total Bilirubin: 0.9 mg/dL (ref 0.3–1.2)
Total Protein: 6.1 g/dL — ABNORMAL LOW (ref 6.5–8.1)

## 2023-06-16 NOTE — Progress Notes (Addendum)
[facility]       Telephone: [phone]?Fax: [phone]   Oncology Clinical Pharmacist Practitioner Progress Note  Kayla Banks was contacted via in-person to discuss her chemotherapy regimen for abemaciclib which they receive under the care of Dr. Serena Croissant. Today she presents to clinic alone.    Current treatment regimen and start date Abemaciclib (05/10/23) 50 mg BID (TBD) 100 mg BID (05/10/23-06/10/23) Letrozole (05/04/23) Denosumab 120 mg (05/17/23)  Interval History She has been holding her abemaciclib 100 mg dose since her last visit on 06/10/23. Abemaciclib is given in combination with letrozole and denosumab. Therapy is planned to continue until disease progression or unacceptable toxicity.   Response to Therapy Kayla Banks has shown improvement since stopping her abemaciclib. Today her chief complaint is regarding the chronic pain she has in her back and hips. She endorses concern in regards to gaining her mobility back which has been limited in part by the pain. She has hydrocodone-apap that is effective for her pain but does not routinely use it. She expresses concern with routine use of strong medications. We will send an ambulatory referral to pain management to further assist with her pain regimen. Since stopping the abemaciclib, she has been able to eat 3 small meals (previously not able to eat any due to nausea). She states she has tried her best to increase her fluid intake as well. Her nausea and vomiting has improved since holding the abemaciclib. She has not had diarrhea since stopping abemaciclib. No signs or symptoms of of ILD or VTE. Labs, vitals, treatment parameters, and manufacturer guidelines assessing toxicity were reviewed with Kayla Banks today. CBC and CMP have both shown significant improvement from her visit last week with the exception of her alkaline phosphatase which is likely elevated in the setting of held abemaciclib. Discussed  possible treatment options moving forward. Based on these values, patient is in agreement to restart abemaciclib at a reduced dose of 50 BID at this time. She has not yet picked up the 50 mg dose, but is planning to do so later today.   Allergies Allergies  Allergen Reactions   Gadolinium Derivatives Anaphylaxis    MRI gadolinium contrast anaphylaxis with hives, facial swelling, wheezing, SOB, hypoxia   Erythromycin Swelling    Lip swelling; angioedema   Lisinopril Swelling    REACTION: angioedema   Penicillins Hives and Swelling    Swelling in arms & hands   Adhesive [Tape] Rash    Vitals    06/16/2023    3:07 PM 06/10/2023    1:12 PM 05/25/2023   11:37 AM  Oncology Vitals  Height  168 cm 168 cm  Weight 101.061 kg 100.29 kg 107.775 kg  Weight (lbs) 222 lbs 13 oz 221 lbs 2 oz 237 lbs 10 oz  BMI 35.96 kg/m2 35.69 kg/m2 38.35 kg/m2  Temp 97 F (36.1 C) 97.3 F (36.3 C) 98.3 F (36.8 C)  Pulse Rate 94 113 64  BP 119/70 121/71 94/50  Resp 18 18   SpO2 100 % 100 % 98 %  BSA (m2) 2.17 m2 2.16 m2 2.24 m2    Laboratory Data    Latest Ref Rng & Units 06/16/2023    2:59 PM 06/10/2023   12:28 PM 05/21/2023   10:47 AM  CBC EXTENDED  WBC 4.0 - 10.5 K/uL 2.2  1.9  3.6   RBC 3.87 - 5.11 MIL/uL 4.12  4.46  3.94   Hemoglobin 12.0 - 15.0 g/dL 29.5  12.9  10.8   HCT 36.0 - 46.0 % 34.9  37.6  35.3   Platelets 150 - 400 K/uL 102  63  161   NEUT# 1.7 - 7.7 K/uL 1.9  1.7  3.0   Lymph# 0.7 - 4.0 K/uL 0.1  0.1  0.4        Latest Ref Rng & Units 06/16/2023    2:59 PM 06/10/2023   12:28 PM 05/21/2023   10:47 AM  CMP  Glucose 70 - 99 mg/dL 638  756  433   BUN 8 - 23 mg/dL 10  15  20    Creatinine 0.44 - 1.00 mg/dL 2.95  1.88  4.16   Sodium 135 - 145 mmol/L 141  140  141   Potassium 3.5 - 5.1 mmol/L 3.4  3.6  4.0   Chloride 98 - 111 mmol/L 105  105  108   CO2 22 - 32 mmol/L 27  25  26    Calcium 8.9 - 10.3 mg/dL 8.2  8.3  8.0   Total Protein 6.5 - 8.1 g/dL 6.1  6.1    Total  Bilirubin 0.3 - 1.2 mg/dL 0.9  1.4    Alkaline Phos 38 - 126 U/L 208  152    AST 15 - 41 U/L 17  23    ALT 0 - 44 U/L 18  24      Adverse Effects Assessment Platelets: low but has improved from previous visit now at 102 K/uL. Will monitor  Potassium: decreased to 3.4. Can consider supplementation in the future if she continues to trend low  Creatinine: improved back to baseline. She has been using ondansetron PRN. She plans to pick up the prochlorperazine today from the pharmacy Calcium: decreased to estimated 8.8 mg/dL after adjusting for albumin. Can consider supplementation in the future if she continues to trend low  Alk Phos: elevated likely in the setting of held therapy. Will monitor Bilirubin: Improved back to baseline    Access Assessment Kayla Banks is currently receiving her abemaciclib through [facility] concerns:  None  Medication Reconciliation The patient's medication list was reviewed today with the patient? Yes New medications or herbal supplements have recently been started? No  Any medications have been discontinued? Yes  - dexamethasone taper  The medication list was updated and reconciled based on the patient's most recent medication list in the electronic medical record (EMR) including herbal products and OTC medications.   Medications Current Outpatient Medications  Medication Sig Dispense Refill   abemaciclib (VERZENIO) 50 MG tablet Take 1 tablet (50 mg total) by mouth 2 (two) times daily. Swallow tablets whole. Do not chew, crush, or split tablets before swallowing. 56 tablet 5   amLODipine (NORVASC) 5 MG tablet Take 1 tablet (5 mg total) by mouth daily. Needs ov before any more refills 90 tablet 1   carvedilol (COREG) 25 MG tablet Take 1 tablet (25 mg total) by mouth 2 (two) times daily with a meal. Due for appt 03/2023 180 tablet 1   EPINEPHRINE 0.3 mg/0.3 mL IJ SOAJ injection INJECT 0.3 ML(1 SYRINGE) IN THE MUSCLE AS NEEDED  FOR ALLERGIC REACTION 2 Device 0   gabapentin (NEURONTIN) 300 MG capsule Take 1 capsule (300 mg total) by mouth 2 (two) times daily. (Patient taking differently: Take 300 mg by mouth 2 (two) times daily.) 180 capsule 1   HYDROcodone-acetaminophen (NORCO) 5-325 MG tablet Take 1-2 tablets by mouth every 4 (four) hours as needed. 60 tablet 0  letrozole (FEMARA) 2.5 MG tablet Take 1 tablet (2.5 mg total) by mouth daily. 90 tablet 3   meloxicam (MOBIC) 15 MG tablet Take 1 tablet (15 mg total) by mouth daily as needed for pain. (Patient taking differently: Take 15 mg by mouth daily as needed for pain.) 90 tablet 1   ondansetron (ZOFRAN) 8 MG tablet 1/2 to 1 tablet q 12 hours prn nausea 20 tablet 3   prochlorperazine (COMPAZINE) 10 MG tablet Take 1 tablet (10 mg total) by mouth every 6 (six) hours as needed for nausea or vomiting. 30 tablet 2   SYNTHROID 100 MCG tablet Take 1 tablet (100 mcg total) by mouth daily before breakfast. 90 tablet 1   valACYclovir (VALTREX) 1000 MG tablet TAKE 1 TABLET BY MOUTH THREE TIMES DAILY AS NEEDED (Patient taking differently: Take 1,000 mg by mouth as needed. Takes 1 tablet by mouth daily as needed for cold sores) 30 tablet 2   No current facility-administered medications for this visit.    Drug-Drug Interactions (DDIs) DDIs were evaluated? Yes Significant DDIs? No  The patient was instructed to speak with their health care provider and/or the oral chemotherapy pharmacist before starting any new drug, including prescription or over the counter, natural / herbal products, or vitamins.  Supportive Care Diarrhea: we reviewed that diarrhea is common with abemaciclib and confirmed that she does have loperamide (Imodium) at home.  We reviewed how to take this medication PRN. Neutropenia: we discussed the importance of having a thermometer and what the Centers for Disease Control and Prevention (CDC) considers a fever which is 100.63F (38C) or higher.  Gave patient 24/7  triage line to call if any fevers or symptoms. ILD/Pneumonitis: we reviewed potential symptoms including cough, shortness, and fatigue.  VTE: reviewed signs of DVT such as leg swelling, redness, pain, or tenderness and signs of PE such as shortness of breath, rapid or irregular heartbeat, cough, chest pain, or lightheadedness. Reviewed to take the medication every 12 hours (with food sometimes can be easier on the stomach) and to take it at the same time every day. Hepatotoxicity:WNL Drug interactions with grapefruit products   Dosing Assessment Hepatic adjustments needed? No  Renal adjustments needed? No  Toxicity adjustments needed? No  The current dosing regimen is appropriate to continue at this time.  Follow-Up Plan Start reduced dose of abemaciclib 50 mg by mouth every 12 hours. She will plan to start therapy tomorrow after she has picked it up from [facility].  Continue letrozole 2.5 mg by mouth daily Continue denosumab 120 mg SubQ every 12 weeks. Next due 08/09/23 Monitor bilirubin, calcium, potassium, platelets, Alk Phos, appetitite, N/V, pain -- will send ambulatory referral to pain management  Consider calcium and/or potassium supplement if she continues to have low calcium or potassium levels  Brain MRI 06/22/23 to assess for metastatic disease to the brain. This was ordered by [employer] and Dr. Pamelia Hoit visit planned for 07/02/23 Will add pharmacy visit and labs around 07/14/23. Given her history, we will continue to monitor her on every 2 week intervals at this time  Plan to obtain restaging scans around 08/09/23 visit   Kayla Banks participated in the discussion, expressed understanding, and voiced agreement with the above plan. All questions were answered to her satisfaction. The patient was advised to contact the clinic at [phone] with any questions or concerns prior to her return visit.   I spent 35 minutes assessing and educating the patient.  Jerry Caras, PharmD PGY2  Oncology Pharmacy Resident   06/16/2023 4:01 PM

## 2023-06-17 ENCOUNTER — Encounter: Payer: Self-pay | Admitting: Hematology and Oncology

## 2023-06-18 ENCOUNTER — Encounter (HOSPITAL_COMMUNITY): Payer: Self-pay

## 2023-06-18 ENCOUNTER — Other Ambulatory Visit (HOSPITAL_COMMUNITY): Payer: Self-pay

## 2023-06-18 ENCOUNTER — Encounter: Payer: Self-pay | Admitting: Radiology

## 2023-06-18 ENCOUNTER — Other Ambulatory Visit: Payer: Self-pay | Admitting: *Deleted

## 2023-06-18 ENCOUNTER — Encounter: Payer: Self-pay | Admitting: *Deleted

## 2023-06-18 MED ORDER — SILVER SULFADIAZINE 1 % EX CREA: 1.0000 | TOPICAL_CREAM | Freq: Two times a day (BID) | CUTANEOUS | 0 refills | Status: DC

## 2023-06-21 ENCOUNTER — Other Ambulatory Visit: Payer: Self-pay

## 2023-06-21 ENCOUNTER — Other Ambulatory Visit (HOSPITAL_COMMUNITY): Payer: Self-pay | Admitting: Pharmacy Technician

## 2023-06-21 ENCOUNTER — Other Ambulatory Visit (HOSPITAL_COMMUNITY): Payer: Self-pay

## 2023-06-21 ENCOUNTER — Other Ambulatory Visit: Payer: BC Managed Care – PPO

## 2023-06-21 ENCOUNTER — Inpatient Hospital Stay: Payer: BC Managed Care – PPO | Admitting: Pharmacist

## 2023-06-21 NOTE — Progress Notes (Signed)
Specialty Pharmacy Refill Coordination Note  Kayla Banks is a 67 y.o. female contacted today regarding refills of specialty medication(s) Abemaciclib   Patient requested Delivery   Delivery date: 06/21/23   Verified address: [address] [place], [place] [address]   Medication will be filled on 06/21/23.   Spoke with patient & okay with Delivery on 06/22/23

## 2023-06-22 ENCOUNTER — Ambulatory Visit
Admission: RE | Admit: 2023-06-22 | Discharge: 2023-06-22 | Disposition: A | Discharge status: Home / Self Care | Payer: BC Managed Care – PPO | Source: Ambulatory Visit | Attending: Radiation Oncology | Admitting: Radiation Oncology

## 2023-06-22 ENCOUNTER — Telehealth: Payer: Self-pay | Admitting: Nurse Practitioner

## 2023-06-22 DIAGNOSIS — C7931 Secondary malignant neoplasm of brain: Secondary | ICD-10-CM

## 2023-06-28 ENCOUNTER — Telehealth: Payer: Self-pay

## 2023-06-28 ENCOUNTER — Encounter: Payer: Self-pay | Admitting: Hematology and Oncology

## 2023-06-28 NOTE — Progress Notes (Deleted)
Palliative Medicine [facility]  [phone] [phone]   Name: Kayla Banks Date: 06/28/2023 MRN: [id number]  DOB: 11-30-1955  Patient Care Team: Zola Button, Grayling Congress, DO as PCP - General Jodi Geralds, MD as Consulting Physician (Orthopedic Surgery) Harriette Bouillon, MD as Consulting Physician (General Surgery) Serena Croissant, MD as Consulting Physician (Hematology and Oncology) Dorothy Puffer, MD as Consulting Physician (Radiation Oncology) Jethro Bolus, MD as Consulting Physician (Ophthalmology) Anselm Lis, RPH-CPP as Pharmacist (Hematology and Oncology)    REASON FOR CONSULTATION: Kayla Banks is a 67 y.o. 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  Palliative ask to see for symptom management and goals of care.    SOCIAL HISTORY:     reports that she has never smoked. She has never used smokeless tobacco. She reports that she does not currently use alcohol. She reports that she does not use drugs.  ADVANCE DIRECTIVES:  None on file  CODE STATUS: Full code  PAST MEDICAL HISTORY: Past Medical History:  Diagnosis Date   Breast cancer (HCC)    Complication of anesthesia    Family history of breast cancer    Heart murmur    no problems, saw cardiologist 2010   Hypertension    on meds   Hypothyroidism    Osteoarthritis    Personal history of chemotherapy    Personal history of radiation therapy    left   Plantar fasciitis    PONV (postoperative nausea and vomiting)    Thyroid disease    Hypothyroidism    PAST SURGICAL HISTORY:  Past Surgical History:  Procedure Laterality Date   ABDOMINAL HYSTERECTOMY  02/2012   AXILLARY LYMPH NODE DISSECTION Left 11/09/2018   Procedure: LEFT AXILLARY LYMPH NODE DISSECTION;  Surgeon: Harriette Bouillon, MD;  Location: [facility];   Service: General;  Laterality: Left;   BREAST BIOPSY Left    malignant   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/11/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   BREAST LUMPECTOMY WITH RADIOACTIVE SEED AND SENTINEL LYMPH NODE BIOPSY Left 10/11/2018   Procedure: LEFT BREAST LUMPECTOMY WITH RADIOACTIVE SEED AND LEFT SENTINEL LYMPH NODE MAPPING WITH LEFT TARGETED LYMPH NODE BIOPSY;  Surgeon: Harriette Bouillon, MD;  Location: [facility];  Service: General;  Laterality: Left;   BRONCHIAL NEEDLE ASPIRATION BIOPSY  05/13/2023   Procedure: BRONCHIAL NEEDLE ASPIRATION BIOPSIES;  Surgeon: Lorin Glass, MD;  Location: [facility] END[facility]COPY;  Service: Pulmonary;;   CATARACT EXTRACTION Bilateral 11/3  06/26/20   shipiro    FOOT SURGERY Left    bone spurs   LUMBAR LAMINECTOMY  1990   PORT-A-CATH REMOVAL N/A 05/23/2019   Procedure: PORT REMOVAL;  Surgeon: Harriette Bouillon, MD;  Location: [facility];  Service: General;  Laterality: N/A;   PORTACATH PLACEMENT Right 12/01/2018   Procedure: INSERTION PORT-A-CATH WITH ULTRASOUND;  Surgeon: Harriette Bouillon, MD;  Location: [facility];  Service: General;  Laterality: Right;   VIDEO BRONCHOSCOPY WITH ENDOBRONCHIAL ULTRASOUND Bilateral 05/13/2023   Procedure: VIDEO BRONCHOSCOPY WITH ENDOBRONCHIAL ULTRASOUND;  Surgeon: Lorin Glass, MD;  Location: [facility];  Service: Pulmonary;  Laterality: Bilateral;    HEMATOLOGY/ONCOLOGY HISTORY:  Oncology History  Malignant neoplasm of upper-outer quadrant of left breast in female, estrogen receptor positive (HCC)  08/24/2018 Initial Biopsy   Screening detected left breast mass at 2:30 position 6 mm in size  with one abnormal lymph node and stable benign calcifications, biopsy of the mass grade 2 IDC with DCIS and lymphovascular invasion, lymph node biopsy is positive, ER 95%, PR 40%, Ki-67 20%, HER-2 -1+ by IHC, T1b N1 stage Ib   08/31/2018 Cancer Staging   Staging form: Breast, AJCC 8th Edition -  Clinical: Stage IB (cT1b, cN1(f), cM0, G2, ER+, PR+, HER2-) - Signed by Serena Croissant, MD on 08/31/2018   09/21/2018 Genetic Testing   Genetic testing performed through [facility]'s Common Hereditary Cancers Panel reported out on 09/19/2018 showed no pathogenic mutations.  The Common Hereditary Cancer Panel offered by [facility] includes sequencing and/or deletion duplication testing of the following 53 genes: APC, ATM, AXIN2, BARD1, BMPR1A, BRCA1, BRCA2, BRIP1, BUB1B, CDH1, CDK4, CDKN2A, CHEK2, CTNNA1, DICER1, ENG, EPCAM, GALNT12, GREM1, HOXB13, KIT, MEN1, MLH1, MLH3, MSH2, MSH3, MSH6, MUTYH, NBN, NF1, NTHL1, PALB2, PDGFRA, PMS2, POLD1, POLE, PTEN, RAD50, RAD51C, RAD51D, RNF43, RPS20, SDHA, SDHB, SDHC, SDHD, SMAD4, SMARCA4, STK11, TP53, TSC1, TSC2, VHL.   A variant of uncertain significance (VUS) in a gene called NBN was also noted. c.1979G>C (p.Arg660Thr)   10/18/2018 Cancer Staging   Staging form: Breast, AJCC 8th Edition - Pathologic stage from 10/18/2018: Stage IB (pT1b, pN2a, cM0, G2, ER+, PR+, HER2-) - Signed by Serena Croissant, MD on 11/14/2018   11/09/2018 Surgery   Left lumpectomy ([facility]) [phone]): Grade 2 IDC with DCIS, 1 cm, margins negative, DCIS focally 0.1 cm from superior margin, 2/2 lymph nodes positive, ER 95%, PR 45%, HER-2 negative, Ki-67 20% AXLND: 3/20 LN Pos (total 5/22)   12/15/2018 -  Chemotherapy   DOXOrubicin (ADRIAMYCIN) chemo injection 142 mg, 60 mg/m2 = 142 mg, Intravenous,  Once, 4 of 4 cycles. Administration: 142 mg (12/15/2018), 142 mg (12/29/2018), 142 mg (01/12/2019), 142 mg (01/26/2019)  palonosetron (ALOXI) injection 0.25 mg, 0.25 mg, Intravenous,  Once, 4 of 4 cycles. Administration: 0.25 mg (12/15/2018), 0.25 mg (12/29/2018), 0.25 mg (01/12/2019), 0.25 mg (01/26/2019)  pegfilgrastim-cbqv (UDENYCA) injection 6 mg, 6 mg, Subcutaneous, Once, 4 of 4 cycles. Administration: 6 mg (12/17/2018), 6 mg (12/31/2018), 6 mg (01/14/2019), 6 mg (01/28/2019)  cyclophosphamide (CYTOXAN) 1,420 mg  in sodium chloride 0.9 % 250 mL chemo infusion, 600 mg/m2 = 1,420 mg, Intravenous,  Once, 4 of 4 cycles. Administration: 1,420 mg (12/15/2018), 1,420 mg (12/29/2018), 1,420 mg (01/12/2019), 1,420 mg (01/26/2019)  PACLitaxel (TAXOL) 192 mg in sodium chloride 0.9 % 250 mL chemo infusion (</= 80mg /m2), 80 mg/m2 = 192 mg, Intravenous,  Once, 11 of 12 cycles. Dose modification: 60 mg/m2 (original dose 80 mg/m2, Cycle 12, Reason: Dose not tolerated). Administration: 192 mg (02/09/2019), 192 mg (02/16/2019), 192 mg (02/23/2019), 192 mg (03/02/2019), 192 mg (03/09/2019), 192 mg (03/16/2019), 192 mg (03/23/2019), 144 mg (03/30/2019), 144 mg (04/06/2019), 144 mg (04/14/2019), 144 mg (04/20/2019)  fosaprepitant (EMEND) 150 mg  dexamethasone (DECADRON) 12 mg in sodium chloride 0.9 % 145 mL IVPB, , Intravenous,  Once, 4 of 4 cycles Administration:  (12/15/2018),  (12/29/2018),  (01/12/2019),  (01/26/2019)     06/01/2019 - 07/21/2019 Radiation Therapy   The patient initially received a dose of 50.4 Gy in 28 fractions to the breast using whole-breast tangent fields. This was delivered using a 3-D conformal technique. The pt received a boost delivering an additional 10 Gy in 5 fractions using a electron boost with electrons. The total dose was 60.4 Gy.    08/2019 - 04/2023 Anti-estrogen oral therapy   Anastrozole   04/02/2023 Relapse/Recurrence   CT CAP 04/02/2023: Small bilateral pleural  effusions, multiple small bilateral pulmonary nodules measuring up to 5 mm, right paratracheal and right hilar lymph nodes indeterminate.  Numerous lytic lesions in axial and appendical skeleton concerning for metastatic disease or myeloma.  Possible acute left seventh eighth ninth rib fractures hypodensities within the liver subcentimeter     04/30/2023 PET scan   PET/CT scan 04/30/2023: Widespread bone metastases cervical thoracic lumbar spine sternum ribs shoulders and pelvis, hypermetabolic right paratracheal and right hilar lymphadenopathy,  tiny pulmonary nodules: Patient has appointment to see Dr. Tonia Brooms for bronchoscopy and biopsy.   05/07/2023 Treatment Plan Change   Letrozole daily, Caffie Damme   05/10/2023 - 06/03/2023 Radiation Therapy   Plan Name: Chest_R_Ribs Site: Ribs, Right Technique: Isodose Plan Mode: Photon Dose Per Fraction: 3 Gy Prescribed Dose (Delivered / Prescribed): 30 Gy / 30 Gy Prescribed Fxs (Delivered / Prescribed): 10 / 10   Plan Name: Chest_L_Ribs Site: Ribs, Left Technique: Isodose Plan Mode: Photon Dose Per Fraction: 3 Gy Prescribed Dose (Delivered / Prescribed): 30 Gy / 30 Gy Prescribed Fxs (Delivered / Prescribed): 10 / 10   Plan Name: Pelvis Site: [place], Right Technique: Isodose Plan Mode: Photon Dose Per Fraction: 3 Gy Prescribed Dose (Delivered / Prescribed): 21 Gy / 21 Gy Prescribed Fxs (Delivered / Prescribed): 7 / 7   Plan Name: Pelvis:1 Site: [place], Right Technique: Isodose Plan Mode: Photon Dose Per Fraction: 3 Gy Prescribed Dose (Delivered / Prescribed): 9 Gy / 9 Gy Prescribed Fxs (Delivered / Prescribed): 3 / 3   Plan Name: Pelvis_Bst Site: Pelvis Technique: Isodose Plan Mode: Photon Dose Per Fraction: 4 Gy Prescribed Dose (Delivered / Prescribed): 12 Gy / 12 Gy Prescribed Fxs (Delivered / Prescribed): 3 / 3       ALLERGIES:  is allergic to gadolinium derivatives, erythromycin, lisinopril, penicillins, and adhesive [tape].  MEDICATIONS:  Current Outpatient Medications  Medication Sig Dispense Refill   abemaciclib (VERZENIO) 50 MG tablet Take 1 tablet (50 mg total) by mouth 2 (two) times daily. Swallow tablets whole. Do not chew, crush, or split tablets before swallowing. 56 tablet 5   amLODipine (NORVASC) 5 MG tablet Take 1 tablet (5 mg total) by mouth daily. Needs ov before any more refills 90 tablet 1   carvedilol (COREG) 25 MG tablet Take 1 tablet (25 mg total) by mouth 2 (two) times daily with a meal. Due for appt 03/2023 180 tablet 1   EPINEPHRINE 0.3  mg/0.3 mL IJ SOAJ injection INJECT 0.3 ML(1 SYRINGE) IN THE MUSCLE AS NEEDED FOR ALLERGIC REACTION 2 Device 0   gabapentin (NEURONTIN) 300 MG capsule Take 1 capsule (300 mg total) by mouth 2 (two) times daily. (Patient taking differently: Take 300 mg by mouth 2 (two) times daily.) 180 capsule 1   HYDROcodone-acetaminophen (NORCO) 5-325 MG tablet Take 1-2 tablets by mouth every 4 (four) hours as needed. 60 tablet 0   letrozole (FEMARA) 2.5 MG tablet Take 1 tablet (2.5 mg total) by mouth daily. 90 tablet 3   meloxicam (MOBIC) 15 MG tablet Take 1 tablet (15 mg total) by mouth daily as needed for pain. (Patient taking differently: Take 15 mg by mouth daily as needed for pain.) 90 tablet 1   ondansetron (ZOFRAN) 8 MG tablet 1/2 to 1 tablet q 12 hours prn nausea 20 tablet 3   prochlorperazine (COMPAZINE) 10 MG tablet Take 1 tablet (10 mg total) by mouth every 6 (six) hours as needed for nausea or vomiting. 30 tablet 2   silver sulfADIAZINE (SILVADENE) 1 % cream  Apply 1 Application topically 2 (two) times daily. 50 g 0   SYNTHROID 100 MCG tablet Take 1 tablet (100 mcg total) by mouth daily before breakfast. 90 tablet 1   valACYclovir (VALTREX) 1000 MG tablet TAKE 1 TABLET BY MOUTH THREE TIMES DAILY AS NEEDED (Patient taking differently: Take 1,000 mg by mouth as needed. Takes 1 tablet by mouth daily as needed for cold sores) 30 tablet 2   No current facility-administered medications for this visit.    VITAL SIGNS: LMP 01/28/2012  There were no vitals filed for this visit.  Estimated body mass index is 35.96 kg/m as calculated from the following:   Height as of 06/10/23: 5\' 6"  (1.676 m).   Weight as of 06/16/23: 222 lb 12.8 oz (101.1 kg).  LABS: CBC:    Component Value Date/Time   WBC 2.2 (L) 06/16/2023 1459   WBC 3.6 (L) 05/21/2023 1047   HGB 11.9 (L) 06/16/2023 1459   HCT 34.9 (L) 06/16/2023 1459   PLT 102 (L) 06/16/2023 1459   MCV 84.7 06/16/2023 1459   NEUTROABS 1.9 06/16/2023 1459    LYMPHSABS 0.1 (L) 06/16/2023 1459   MONOABS 0.1 06/16/2023 1459   EOSABS 0.0 06/16/2023 1459   BASOSABS 0.0 06/16/2023 1459   Comprehensive Metabolic Panel:    Component Value Date/Time   NA 141 06/16/2023 1459   K 3.4 (L) 06/16/2023 1459   CL 105 06/16/2023 1459   CO2 27 06/16/2023 1459   BUN 10 06/16/2023 1459   CREATININE 0.76 06/16/2023 1459   CREATININE 0.94 04/09/2023 1413   GLUCOSE 110 (H) 06/16/2023 1459   CALCIUM 8.2 (L) 06/16/2023 1459   AST 17 06/16/2023 1459   ALT 18 06/16/2023 1459   ALKPHOS 208 (H) 06/16/2023 1459   BILITOT 0.9 06/16/2023 1459   PROT 6.1 (L) 06/16/2023 1459   ALBUMIN 3.3 (L) 06/16/2023 1459    RADIOGRAPHIC STUDIES: No results found.  PERFORMANCE STATUS (ECOG) : {CHL ONC ECOG QI:[id number]}  Review of Systems Unless otherwise noted, a complete review of systems is negative.  Physical Exam General: NAD Cardiovascular: regular rate and rhythm Pulmonary: clear ant fields Abdomen: soft, nontender, + bowel sounds Extremities: no edema, no joint deformities Skin: no rashes Neurological: Alert and oriented x3  IMPRESSION: *** I introduced myself, Cassady Stanczak RN, and Palliative's role in collaboration with the oncology team. Concept of Palliative Care was introduced as specialized medical care for people and their families living with serious illness.  It focuses on providing relief from the symptoms and stress of a serious illness.  The goal is to improve quality of life for both the patient and the family. Values and goals of care important to patient and family were attempted to be elicited.    We discussed *** current illness and what it means in the larger context of *** on-going co-morbidities. Natural disease trajectory and expectations were discussed.  I discussed the importance of continued conversation with family and their medical providers regarding overall plan of care and treatment options, ensuring decisions are within the context of  the patients values and GOCs.  PLAN: Established therapeutic relationship. Education provided on palliative's role in collaboration with their Oncology/Radiation team. I will plan to see patient back in 2-4 weeks in collaboration to other oncology appointments.    Patient expressed understanding and was in agreement with this plan. She also understands that She can call the clinic at any time with any questions, concerns, or complaints.   Thank you for your referral  and allowing Palliative to assist in Mrs. Kayla Banks's care.   Number and complexity of problems addressed: ***HIGH - 1 or more chronic illnesses with SEVERE exacerbation, progression, or side effects of treatment - advanced cancer, pain. Any controlled substances utilized were prescribed in the context of palliative care.   Visit consisted of counseling and education dealing with the complex and emotionally intense issues of symptom management and palliative care in the setting of serious and potentially life-threatening illness.  Signed by: Willette Alma, AGPCNP-BC Palliative Medicine Team/[facility]   *Please note that this is a verbal dictation therefore any spelling or grammatical errors are due to the "[employer] One" system interpretation.

## 2023-06-28 NOTE — Telephone Encounter (Signed)
Message was sent to scheduler Rockey Situ high priority. Pt has to reschedule appt. On 11/15 due to transportation problems. She is able to come up next week on Tuesday or Wednesday for labs. I told her I don't think he has any openings next week to see him, but if you are able to fit her in, could you schedule her and call her with appt. If not can you schedule the lab and telephone visit to follow up to discuss the labs.

## 2023-06-30 ENCOUNTER — Telehealth: Payer: Self-pay | Admitting: Nurse Practitioner

## 2023-06-30 ENCOUNTER — Inpatient Hospital Stay: Payer: BC Managed Care – PPO | Admitting: Nurse Practitioner

## 2023-07-02 ENCOUNTER — Encounter: Payer: Self-pay | Admitting: *Deleted

## 2023-07-02 ENCOUNTER — Other Ambulatory Visit: Payer: BC Managed Care – PPO

## 2023-07-02 ENCOUNTER — Inpatient Hospital Stay: Payer: BC Managed Care – PPO | Attending: Hematology and Oncology | Admitting: Hematology and Oncology

## 2023-07-02 DIAGNOSIS — Z170 Estrogen receptor positive status [ER+]: Secondary | ICD-10-CM | POA: Diagnosis not present

## 2023-07-02 DIAGNOSIS — C50412 Malignant neoplasm of upper-outer quadrant of left female breast: Secondary | ICD-10-CM | POA: Diagnosis not present

## 2023-07-02 DIAGNOSIS — Z9221 Personal history of antineoplastic chemotherapy: Secondary | ICD-10-CM | POA: Insufficient documentation

## 2023-07-02 DIAGNOSIS — C7951 Secondary malignant neoplasm of bone: Secondary | ICD-10-CM

## 2023-07-02 DIAGNOSIS — Z923 Personal history of irradiation: Secondary | ICD-10-CM | POA: Insufficient documentation

## 2023-07-02 DIAGNOSIS — Z79811 Long term (current) use of aromatase inhibitors: Secondary | ICD-10-CM | POA: Insufficient documentation

## 2023-07-02 NOTE — Assessment & Plan Note (Addendum)
Left lumpectomy: Grade 2 IDC with DCIS, 1 cm, margins negative, DCIS focally 0.1 cm from superior margin, 2/2 lymph nodes positive, ER 95%, PR 45%, HER-2 negative, Ki-67 20% AXLND: 3/20 LN Pos (total 5/22)   Treatment Plan: 1. Adj chemo with DD Adriamycin and Cytoxan followed by Taxol weekly X 11 (stopped for neuropathy) completed 04/19/2019 2. Adj XRT 06/01/2019-07/21/2019 3. Adj Anti estrogen therapy --------------------------------------------------------------------------------------------- Current treatment: Anastrozole 1 mg daily started 07/26/2019 switched to Letrozole and Verzenio   Bone density 05/05/2022: T score of 0.3: Normal    CT CAP 04/02/2023: Small bilateral pleural effusions, multiple small bilateral pulmonary nodules measuring up to 5 mm, right paratracheal and right hilar lymph nodes indeterminate.  Numerous lytic lesions in axial and appendical skeleton concerning for metastatic disease or myeloma.  Possible acute left seventh eighth ninth rib fractures hypodensities within the liver subcentimeter  05/13/2023: FNA lymph node: Metastatic breast cancer (positive for GATA 3)   Treatment plan: PET/CT scan 04/30/2023: Widespread bone metastases cervical thoracic lumbar spine sternum ribs shoulders and pelvis, hypermetabolic right paratracheal and right hilar lymphadenopathy, tiny pulmonary nodules: Patient has appointment to see Dr. Tonia Brooms for bronchoscopy and biopsy. Current treatment: Anastrozole with Verzenio started 05/04/2023 Bone metastasis: Xgeva along with calcium and vitamin D Patient seeing Dr. Mitzi Hansen for palliative radiation 05/27/2023-06/03/2023   Verzinio toxicities: (Now at 50 mg BiD) Nausea Diarrhea  Brain MRI 07/01/2023: No brain mets Bone mets: Xgeva in 1 month Scans in 1 month and follow up 1 week after.

## 2023-07-02 NOTE — Progress Notes (Signed)
Patient Care Team: Kayla Banks, Kayla Congress, DO as PCP - General Jodi Geralds, MD as Consulting Physician (Orthopedic Surgery) Harriette Bouillon, MD as Consulting Physician (General Surgery) Serena Croissant, MD as Consulting Physician (Hematology and Oncology) Dorothy Puffer, MD as Consulting Physician (Radiation Oncology) Jethro Bolus, MD as Consulting Physician (Ophthalmology) Anselm Lis, RPH-CPP as Pharmacist (Hematology and Oncology)  TELEPHONE VISIT  DIAGNOSIS:  Encounter Diagnoses  Name Primary?   Malignant neoplasm of upper-outer quadrant of left breast in female, estrogen receptor positive (HCC) Yes   Metastatic cancer to bone (HCC)     SUMMARY OF ONCOLOGIC HISTORY: Oncology History  Malignant neoplasm of upper-outer quadrant of left breast in female, estrogen receptor positive (HCC)  08/24/2018 Initial Biopsy   Screening detected left breast mass at 2:30 position 6 mm in size with one abnormal lymph node and stable benign calcifications, biopsy of the mass grade 2 IDC with DCIS and lymphovascular invasion, lymph node biopsy is positive, ER 95%, PR 40%, Ki-67 20%, HER-2 -1+ by IHC, T1b N1 stage Ib   08/31/2018 Cancer Staging   Staging form: Breast, AJCC 8th Edition - Clinical: Stage IB (cT1b, cN1(f), cM0, G2, ER+, PR+, HER2-) - Signed by Serena Croissant, MD on 08/31/2018   09/21/2018 Genetic Testing   Genetic testing performed through [facility]'s Common Hereditary Cancers Panel reported out on 09/19/2018 showed no pathogenic mutations.  The Common Hereditary Cancer Panel offered by [facility] includes sequencing and/or deletion duplication testing of the following 53 genes: APC, ATM, AXIN2, BARD1, BMPR1A, BRCA1, BRCA2, BRIP1, BUB1B, CDH1, CDK4, CDKN2A, CHEK2, CTNNA1, DICER1, ENG, EPCAM, GALNT12, GREM1, HOXB13, KIT, MEN1, MLH1, MLH3, MSH2, MSH3, MSH6, MUTYH, NBN, NF1, NTHL1, PALB2, PDGFRA, PMS2, POLD1, POLE, PTEN, RAD50, RAD51C, RAD51D, RNF43, RPS20, SDHA, SDHB, SDHC, SDHD, SMAD4, SMARCA4,  STK11, TP53, TSC1, TSC2, VHL.   A variant of uncertain significance (VUS) in a gene called NBN was also noted. c.1979G>C (p.Arg660Thr)   10/18/2018 Cancer Staging   Staging form: Breast, AJCC 8th Edition - Pathologic stage from 10/18/2018: Stage IB (pT1b, pN2a, cM0, G2, ER+, PR+, HER2-) - Signed by Serena Croissant, MD on 11/14/2018   11/09/2018 Surgery   Left lumpectomy ([facility]) [phone]): Grade 2 IDC with DCIS, 1 cm, margins negative, DCIS focally 0.1 cm from superior margin, 2/2 lymph nodes positive, ER 95%, PR 45%, HER-2 negative, Ki-67 20% AXLND: 3/20 LN Pos (total 5/22)   12/15/2018 -  Chemotherapy   DOXOrubicin (ADRIAMYCIN) chemo injection 142 mg, 60 mg/m2 = 142 mg, Intravenous,  Once, 4 of 4 cycles. Administration: 142 mg (12/15/2018), 142 mg (12/29/2018), 142 mg (01/12/2019), 142 mg (01/26/2019)  palonosetron (ALOXI) injection 0.25 mg, 0.25 mg, Intravenous,  Once, 4 of 4 cycles. Administration: 0.25 mg (12/15/2018), 0.25 mg (12/29/2018), 0.25 mg (01/12/2019), 0.25 mg (01/26/2019)  pegfilgrastim-cbqv (UDENYCA) injection 6 mg, 6 mg, Subcutaneous, Once, 4 of 4 cycles. Administration: 6 mg (12/17/2018), 6 mg (12/31/2018), 6 mg (01/14/2019), 6 mg (01/28/2019)  cyclophosphamide (CYTOXAN) 1,420 mg in sodium chloride 0.9 % 250 mL chemo infusion, 600 mg/m2 = 1,420 mg, Intravenous,  Once, 4 of 4 cycles. Administration: 1,420 mg (12/15/2018), 1,420 mg (12/29/2018), 1,420 mg (01/12/2019), 1,420 mg (01/26/2019)  PACLitaxel (TAXOL) 192 mg in sodium chloride 0.9 % 250 mL chemo infusion (</= 80mg /m2), 80 mg/m2 = 192 mg, Intravenous,  Once, 11 of 12 cycles. Dose modification: 60 mg/m2 (original dose 80 mg/m2, Cycle 12, Reason: Dose not tolerated). Administration: 192 mg (02/09/2019), 192 mg (02/16/2019), 192 mg (02/23/2019), 192 mg (03/02/2019), 192 mg (03/09/2019), 192 mg (  03/16/2019), 192 mg (03/23/2019), 144 mg (03/30/2019), 144 mg (04/06/2019), 144 mg (04/14/2019), 144 mg (04/20/2019)  fosaprepitant (EMEND) 150 mg  dexamethasone  (DECADRON) 12 mg in sodium chloride 0.9 % 145 mL IVPB, , Intravenous,  Once, 4 of 4 cycles Administration:  (12/15/2018),  (12/29/2018),  (01/12/2019),  (01/26/2019)     06/01/2019 - 07/21/2019 Radiation Therapy   The patient initially received a dose of 50.4 Gy in 28 fractions to the breast using whole-breast tangent fields. This was delivered using a 3-D conformal technique. The pt received a boost delivering an additional 10 Gy in 5 fractions using a electron boost with electrons. The total dose was 60.4 Gy.    08/2019 - 04/2023 Anti-estrogen oral therapy   Anastrozole   04/02/2023 Relapse/Recurrence   CT CAP 04/02/2023: Small bilateral pleural effusions, multiple small bilateral pulmonary nodules measuring up to 5 mm, right paratracheal and right hilar lymph nodes indeterminate.  Numerous lytic lesions in axial and appendical skeleton concerning for metastatic disease or myeloma.  Possible acute left seventh eighth ninth rib fractures hypodensities within the liver subcentimeter     04/30/2023 PET scan   PET/CT scan 04/30/2023: Widespread bone metastases cervical thoracic lumbar spine sternum ribs shoulders and pelvis, hypermetabolic right paratracheal and right hilar lymphadenopathy, tiny pulmonary nodules: Patient has appointment to see Dr. Tonia Brooms for bronchoscopy and biopsy.   05/07/2023 Treatment Plan Change   Letrozole daily, Caffie Damme   05/10/2023 - 06/03/2023 Radiation Therapy   Plan Name: Chest_R_Ribs Site: Ribs, Right Technique: Isodose Plan Mode: Photon Dose Per Fraction: 3 Gy Prescribed Dose (Delivered / Prescribed): 30 Gy / 30 Gy Prescribed Fxs (Delivered / Prescribed): 10 / 10   Plan Name: Chest_L_Ribs Site: Ribs, Left Technique: Isodose Plan Mode: Photon Dose Per Fraction: 3 Gy Prescribed Dose (Delivered / Prescribed): 30 Gy / 30 Gy Prescribed Fxs (Delivered / Prescribed): 10 / 10   Plan Name: Pelvis Site: [place], Right Technique: Isodose Plan Mode:  Photon Dose Per Fraction: 3 Gy Prescribed Dose (Delivered / Prescribed): 21 Gy / 21 Gy Prescribed Fxs (Delivered / Prescribed): 7 / 7   Plan Name: Pelvis:1 Site: [place], Right Technique: Isodose Plan Mode: Photon Dose Per Fraction: 3 Gy Prescribed Dose (Delivered / Prescribed): 9 Gy / 9 Gy Prescribed Fxs (Delivered / Prescribed): 3 / 3   Plan Name: Pelvis_Bst Site: Pelvis Technique: Isodose Plan Mode: Photon Dose Per Fraction: 4 Gy Prescribed Dose (Delivered / Prescribed): 12 Gy / 12 Gy Prescribed Fxs (Delivered / Prescribed): 3 / 3       CHIEF COMPLIANT: Follow-up on Verzenio  HISTORY OF PRESENT ILLNESS:  History of Present Illness   The patient, with a history of cancer, presents for a follow-up appointment after completing radiation therapy a month ago. She reports feeling better since the end of radiation, but recovery has been slow. She also reports experiencing significant side effects from Verzenio, including nausea and vomiting, which led to a reduction in dosage. Since the dosage reduction, the patient reports being able to keep food down better, although she still cannot eat a lot.  The patient also reports some ongoing pain on her right side, near the shoulder blade area, and sporadic pain in her lower back. She also mentions a feeling of weakness in her leg. She has been receiving Xgeva injections for bone health, with the last injection received on September 20th.         ALLERGIES:  is allergic to gadolinium derivatives, erythromycin, lisinopril, penicillins, and adhesive [  tape].  MEDICATIONS:  Current Outpatient Medications  Medication Sig Dispense Refill   abemaciclib (VERZENIO) 50 MG tablet Take 1 tablet (50 mg total) by mouth 2 (two) times daily. Swallow tablets whole. Do not chew, crush, or split tablets before swallowing. 56 tablet 5   amLODipine (NORVASC) 5 MG tablet Take 1 tablet (5 mg total) by mouth daily. Needs ov before any more refills 90 tablet 1    carvedilol (COREG) 25 MG tablet Take 1 tablet (25 mg total) by mouth 2 (two) times daily with a meal. Due for appt 03/2023 180 tablet 1   EPINEPHRINE 0.3 mg/0.3 mL IJ SOAJ injection INJECT 0.3 ML(1 SYRINGE) IN THE MUSCLE AS NEEDED FOR ALLERGIC REACTION 2 Device 0   gabapentin (NEURONTIN) 300 MG capsule Take 1 capsule (300 mg total) by mouth 2 (two) times daily. (Patient taking differently: Take 300 mg by mouth 2 (two) times daily.) 180 capsule 1   HYDROcodone-acetaminophen (NORCO) 5-325 MG tablet Take 1-2 tablets by mouth every 4 (four) hours as needed. 60 tablet 0   letrozole (FEMARA) 2.5 MG tablet Take 1 tablet (2.5 mg total) by mouth daily. 90 tablet 3   meloxicam (MOBIC) 15 MG tablet Take 1 tablet (15 mg total) by mouth daily as needed for pain. (Patient taking differently: Take 15 mg by mouth daily as needed for pain.) 90 tablet 1   ondansetron (ZOFRAN) 8 MG tablet 1/2 to 1 tablet q 12 hours prn nausea 20 tablet 3   prochlorperazine (COMPAZINE) 10 MG tablet Take 1 tablet (10 mg total) by mouth every 6 (six) hours as needed for nausea or vomiting. 30 tablet 2   silver sulfADIAZINE (SILVADENE) 1 % cream Apply 1 Application topically 2 (two) times daily. 50 g 0   SYNTHROID 100 MCG tablet Take 1 tablet (100 mcg total) by mouth daily before breakfast. 90 tablet 1   valACYclovir (VALTREX) 1000 MG tablet TAKE 1 TABLET BY MOUTH THREE TIMES DAILY AS NEEDED (Patient taking differently: Take 1,000 mg by mouth as needed. Takes 1 tablet by mouth daily as needed for cold sores) 30 tablet 2   No current facility-administered medications for this visit.    PHYSICAL EXAMINATION: ECOG PERFORMANCE STATUS: 1 - Symptomatic but completely ambulatory    LABORATORY DATA:  I have reviewed the data as listed    Latest Ref Rng & Units 06/16/2023    2:59 PM 06/10/2023   12:28 PM 05/21/2023   10:47 AM  CMP  Glucose 70 - 99 mg/dL 409  811  914   BUN 8 - 23 mg/dL 10  15  20    Creatinine 0.44 - 1.00 mg/dL 7.82   9.56  2.13   Sodium 135 - 145 mmol/L 141  140  141   Potassium 3.5 - 5.1 mmol/L 3.4  3.6  4.0   Chloride 98 - 111 mmol/L 105  105  108   CO2 22 - 32 mmol/L 27  25  26    Calcium 8.9 - 10.3 mg/dL 8.2  8.3  8.0   Total Protein 6.5 - 8.1 g/dL 6.1  6.1    Total Bilirubin 0.3 - 1.2 mg/dL 0.9  1.4    Alkaline Phos 38 - 126 U/L 208  152    AST 15 - 41 U/L 17  23    ALT 0 - 44 U/L 18  24      Lab Results  Component Value Date   WBC 2.2 (L) 06/16/2023   HGB 11.9 (L) 06/16/2023  HCT 34.9 (L) 06/16/2023   MCV 84.7 06/16/2023   PLT 102 (L) 06/16/2023   NEUTROABS 1.9 06/16/2023    ASSESSMENT & PLAN:  Malignant neoplasm of upper-outer quadrant of left breast in female, estrogen receptor positive (HCC) Left lumpectomy: Grade 2 IDC with DCIS, 1 cm, margins negative, DCIS focally 0.1 cm from superior margin, 2/2 lymph nodes positive, ER 95%, PR 45%, HER-2 negative, Ki-67 20% AXLND: 3/20 LN Pos (total 5/22)   Treatment Plan: 1. Adj chemo with DD Adriamycin and Cytoxan followed by Taxol weekly X 11 (stopped for neuropathy) completed 04/19/2019 2. Adj XRT 06/01/2019-07/21/2019 3. Adj Anti estrogen therapy --------------------------------------------------------------------------------------------- Current treatment: Anastrozole 1 mg daily started 07/26/2019 switched to Letrozole and Verzenio   Bone density 05/05/2022: T score of 0.3: Normal    CT CAP 04/02/2023: Small bilateral pleural effusions, multiple small bilateral pulmonary nodules measuring up to 5 mm, right paratracheal and right hilar lymph nodes indeterminate.  Numerous lytic lesions in axial and appendical skeleton concerning for metastatic disease or myeloma.  Possible acute left seventh eighth ninth rib fractures hypodensities within the liver subcentimeter  05/13/2023: FNA lymph node: Metastatic breast cancer (positive for GATA 3)   Treatment plan: PET/CT scan 04/30/2023: Widespread bone metastases cervical thoracic lumbar spine sternum  ribs shoulders and pelvis, hypermetabolic right paratracheal and right hilar lymphadenopathy, tiny pulmonary nodules: Patient has appointment to see Dr. Tonia Brooms for bronchoscopy and biopsy. Current treatment: Anastrozole with Verzenio started 05/04/2023 Bone metastasis: Xgeva along with calcium and vitamin D Patient seeing Dr. Mitzi Hansen for palliative radiation 05/27/2023-06/03/2023   Verzinio toxicities: (Now at 50 mg BiD) Nausea Diarrhea  Brain MRI 07/01/2023: No brain mets Bone mets: Xgeva in 1 month Scans in 1 month and follow up 1 week after. ------------------------------------- Assessment and Plan    Metastatic Breast Cancer Patient is tolerating reduced dose of Verzenio better with less nausea and vomiting. Recent brain MRI shows no intracranial metastasis, but there are spots on the skull. Lung nodules are present and will be treated with the same regimen. -Continue reduced dose of Verzenio. -Plan for a CT scan in one month to assess response to treatment. -Discuss results of cancer cell analysis at next visit.  Bone Health Patient has been receiving Xgeva injections for bone health. -Continue Xgeva injections, next due at next visit.  Pain Management Patient reports sporadic pain in right shoulder blade area and lower back, with some weakness in leg. -Coordinate with palliative care team for pain management strategies at next visit.          Orders Placed This Encounter  Procedures   CT CHEST ABDOMEN PELVIS W CONTRAST    Standing Status:   Future    Standing Expiration Date:   07/01/2024    Order Specific Question:   If indicated for the ordered procedure, I authorize the administration of contrast media per Radiology protocol    Answer:   Yes    Order Specific Question:   Does the patient have a contrast media/X-ray dye allergy?    Answer:   No    Order Specific Question:   Preferred imaging location?    Answer:   [facility]    Order Specific Question:    Release to patient    Answer:   Immediate    Order Specific Question:   If indicated for the ordered procedure, I authorize the administration of oral contrast media per Radiology protocol    Answer:   Yes   The patient has  a good understanding of the overall plan. she agrees with it. she will call with any problems that may develop before the next visit here. Total time spent: 30 mins including face to face time and time spent for planning, charting and co-ordination of care   Tamsen Meek, MD 07/02/23

## 2023-07-02 NOTE — Progress Notes (Signed)
Per MD request, RN placed call to [facility] path and spoke with [place] to order breast prognostic panel on recent biopsy from 05/13/23.  Sydney verbalized understanding.  Also per MD request, RN successfully faxed [facility] request to [phone].

## 2023-07-05 ENCOUNTER — Telehealth: Payer: Self-pay

## 2023-07-05 NOTE — Progress Notes (Deleted)
Palliative Medicine [facility]  [phone] [phone]   Name: Kayla Banks Date: 07/05/2023 MRN: [id number]  DOB: 1956-07-21  Patient Care Team: Zola Button, Grayling Congress, DO as PCP - General Jodi Geralds, MD as Consulting Physician (Orthopedic Surgery) Harriette Bouillon, MD as Consulting Physician (General Surgery) Serena Croissant, MD as Consulting Physician (Hematology and Oncology) Dorothy Puffer, MD as Consulting Physician (Radiation Oncology) Jethro Bolus, MD as Consulting Physician (Ophthalmology) Anselm Lis, RPH-CPP as Pharmacist (Hematology and Oncology)    REASON FOR CONSULTATION: Mykelti Baar is a 67 y.o. 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  Palliative ask to see for symptom management and goals of care.    SOCIAL HISTORY:     reports that she has never smoked. She has never used smokeless tobacco. She reports that she does not currently use alcohol. She reports that she does not use drugs.  ADVANCE DIRECTIVES:  None on file  CODE STATUS: Full code  PAST MEDICAL HISTORY: Past Medical History:  Diagnosis Date  . Breast cancer (HCC)   . Complication of anesthesia   . Family history of breast cancer   . Heart murmur    no problems, saw cardiologist 2010  . Hypertension    on meds  . Hypothyroidism   . Osteoarthritis   . Personal history of chemotherapy   . Personal history of radiation therapy    left  . Plantar fasciitis   . PONV (postoperative nausea and vomiting)   . Thyroid disease    Hypothyroidism    PAST SURGICAL HISTORY:  Past Surgical History:  Procedure Laterality Date  . ABDOMINAL HYSTERECTOMY  02/2012  . AXILLARY LYMPH NODE DISSECTION Left 11/09/2018   Procedure: LEFT AXILLARY LYMPH NODE DISSECTION;  Surgeon: Harriette Bouillon, MD;  Location: [facility];  Service: General;  Laterality: Left;  . BREAST BIOPSY Left    malignant  . 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/11/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  . BREAST LUMPECTOMY WITH RADIOACTIVE SEED AND SENTINEL LYMPH NODE BIOPSY Left 10/11/2018   Procedure: LEFT BREAST LUMPECTOMY WITH RADIOACTIVE SEED AND LEFT SENTINEL LYMPH NODE MAPPING WITH LEFT TARGETED LYMPH NODE BIOPSY;  Surgeon: Harriette Bouillon, MD;  Location: [facility];  Service: General;  Laterality: Left;  . BRONCHIAL NEEDLE ASPIRATION BIOPSY  05/13/2023   Procedure: BRONCHIAL NEEDLE ASPIRATION BIOPSIES;  Surgeon: Lorin Glass, MD;  Location: [facility] END[facility]COPY;  Service: Pulmonary;;  . CATARACT EXTRACTION Bilateral 11/3  06/26/20   shipiro   . FOOT SURGERY Left    bone spurs  . LUMBAR LAMINECTOMY  1990  . PORT-A-CATH REMOVAL N/A 05/23/2019   Procedure: PORT REMOVAL;  Surgeon: Harriette Bouillon, MD;  Location:  SURGERY CENTER;  Service: General;  Laterality: N/A;  . PORTACATH PLACEMENT Right 12/01/2018   Procedure: INSERTION PORT-A-CATH WITH ULTRASOUND;  Surgeon: Harriette Bouillon, MD;  Location:  SURGERY CENTER;  Service: General;  Laterality: Right;  Marland Kitchen VIDEO BRONCHOSCOPY WITH ENDOBRONCHIAL ULTRASOUND Bilateral 05/13/2023   Procedure: VIDEO BRONCHOSCOPY WITH ENDOBRONCHIAL ULTRASOUND;  Surgeon: Lorin Glass, MD;  Location: [facility];  Service: Pulmonary;  Laterality: Bilateral;    HEMATOLOGY/ONCOLOGY HISTORY:  Oncology History  Malignant neoplasm of upper-outer quadrant of left breast in female, estrogen receptor positive (HCC)  08/24/2018 Initial Biopsy   Screening detected left breast mass at 2:30 position 6 mm in size  with one abnormal lymph node and stable benign calcifications, biopsy of the mass grade 2 IDC with DCIS and lymphovascular invasion, lymph node biopsy is positive, ER 95%, PR 40%, Ki-67 20%, HER-2 -1+ by IHC, T1b N1 stage Ib   08/31/2018 Cancer Staging   Staging form:  Breast, AJCC 8th Edition - Clinical: Stage IB (cT1b, cN1(f), cM0, G2, ER+, PR+, HER2-) - Signed by Serena Croissant, MD on 08/31/2018   09/21/2018 Genetic Testing   Genetic testing performed through [facility]'s Common Hereditary Cancers Panel reported out on 09/19/2018 showed no pathogenic mutations.  The Common Hereditary Cancer Panel offered by [facility] includes sequencing and/or deletion duplication testing of the following 53 genes: APC, ATM, AXIN2, BARD1, BMPR1A, BRCA1, BRCA2, BRIP1, BUB1B, CDH1, CDK4, CDKN2A, CHEK2, CTNNA1, DICER1, ENG, EPCAM, GALNT12, GREM1, HOXB13, KIT, MEN1, MLH1, MLH3, MSH2, MSH3, MSH6, MUTYH, NBN, NF1, NTHL1, PALB2, PDGFRA, PMS2, POLD1, POLE, PTEN, RAD50, RAD51C, RAD51D, RNF43, RPS20, SDHA, SDHB, SDHC, SDHD, SMAD4, SMARCA4, STK11, TP53, TSC1, TSC2, VHL.   A variant of uncertain significance (VUS) in a gene called NBN was also noted. c.1979G>C (p.Arg660Thr)   10/18/2018 Cancer Staging   Staging form: Breast, AJCC 8th Edition - Pathologic stage from 10/18/2018: Stage IB (pT1b, pN2a, cM0, G2, ER+, PR+, HER2-) - Signed by Serena Croissant, MD on 11/14/2018   11/09/2018 Surgery   Left lumpectomy ([facility]) [phone]): Grade 2 IDC with DCIS, 1 cm, margins negative, DCIS focally 0.1 cm from superior margin, 2/2 lymph nodes positive, ER 95%, PR 45%, HER-2 negative, Ki-67 20% AXLND: 3/20 LN Pos (total 5/22)   12/15/2018 -  Chemotherapy   DOXOrubicin (ADRIAMYCIN) chemo injection 142 mg, 60 mg/m2 = 142 mg, Intravenous,  Once, 4 of 4 cycles. Administration: 142 mg (12/15/2018), 142 mg (12/29/2018), 142 mg (01/12/2019), 142 mg (01/26/2019)  palonosetron (ALOXI) injection 0.25 mg, 0.25 mg, Intravenous,  Once, 4 of 4 cycles. Administration: 0.25 mg (12/15/2018), 0.25 mg (12/29/2018), 0.25 mg (01/12/2019), 0.25 mg (01/26/2019)  pegfilgrastim-cbqv (UDENYCA) injection 6 mg, 6 mg, Subcutaneous, Once, 4 of 4 cycles. Administration: 6 mg (12/17/2018), 6 mg (12/31/2018), 6 mg (01/14/2019), 6 mg  (01/28/2019)  cyclophosphamide (CYTOXAN) 1,420 mg in sodium chloride 0.9 % 250 mL chemo infusion, 600 mg/m2 = 1,420 mg, Intravenous,  Once, 4 of 4 cycles. Administration: 1,420 mg (12/15/2018), 1,420 mg (12/29/2018), 1,420 mg (01/12/2019), 1,420 mg (01/26/2019)  PACLitaxel (TAXOL) 192 mg in sodium chloride 0.9 % 250 mL chemo infusion (</= 80mg /m2), 80 mg/m2 = 192 mg, Intravenous,  Once, 11 of 12 cycles. Dose modification: 60 mg/m2 (original dose 80 mg/m2, Cycle 12, Reason: Dose not tolerated). Administration: 192 mg (02/09/2019), 192 mg (02/16/2019), 192 mg (02/23/2019), 192 mg (03/02/2019), 192 mg (03/09/2019), 192 mg (03/16/2019), 192 mg (03/23/2019), 144 mg (03/30/2019), 144 mg (04/06/2019), 144 mg (04/14/2019), 144 mg (04/20/2019)  fosaprepitant (EMEND) 150 mg  dexamethasone (DECADRON) 12 mg in sodium chloride 0.9 % 145 mL IVPB, , Intravenous,  Once, 4 of 4 cycles Administration:  (12/15/2018),  (12/29/2018),  (01/12/2019),  (01/26/2019)     06/01/2019 - 07/21/2019 Radiation Therapy   The patient initially received a dose of 50.4 Gy in 28 fractions to the breast using whole-breast tangent fields. This was delivered using a 3-D conformal technique. The pt received a boost delivering an additional 10 Gy in 5 fractions using a electron boost with electrons. The total dose was 60.4 Gy.    08/2019 - 04/2023 Anti-estrogen oral therapy   Anastrozole   04/02/2023 Relapse/Recurrence   CT CAP 04/02/2023: Small bilateral pleural  effusions, multiple small bilateral pulmonary nodules measuring up to 5 mm, right paratracheal and right hilar lymph nodes indeterminate.  Numerous lytic lesions in axial and appendical skeleton concerning for metastatic disease or myeloma.  Possible acute left seventh eighth ninth rib fractures hypodensities within the liver subcentimeter     04/30/2023 PET scan   PET/CT scan 04/30/2023: Widespread bone metastases cervical thoracic lumbar spine sternum ribs shoulders and pelvis, hypermetabolic  right paratracheal and right hilar lymphadenopathy, tiny pulmonary nodules: Patient has appointment to see Dr. Tonia Brooms for bronchoscopy and biopsy.   05/07/2023 Treatment Plan Change   Letrozole daily, Caffie Damme   05/10/2023 - 06/03/2023 Radiation Therapy   Plan Name: Chest_R_Ribs Site: Ribs, Right Technique: Isodose Plan Mode: Photon Dose Per Fraction: 3 Gy Prescribed Dose (Delivered / Prescribed): 30 Gy / 30 Gy Prescribed Fxs (Delivered / Prescribed): 10 / 10   Plan Name: Chest_L_Ribs Site: Ribs, Left Technique: Isodose Plan Mode: Photon Dose Per Fraction: 3 Gy Prescribed Dose (Delivered / Prescribed): 30 Gy / 30 Gy Prescribed Fxs (Delivered / Prescribed): 10 / 10   Plan Name: Pelvis Site: [place], Right Technique: Isodose Plan Mode: Photon Dose Per Fraction: 3 Gy Prescribed Dose (Delivered / Prescribed): 21 Gy / 21 Gy Prescribed Fxs (Delivered / Prescribed): 7 / 7   Plan Name: Pelvis:1 Site: [place], Right Technique: Isodose Plan Mode: Photon Dose Per Fraction: 3 Gy Prescribed Dose (Delivered / Prescribed): 9 Gy / 9 Gy Prescribed Fxs (Delivered / Prescribed): 3 / 3   Plan Name: Pelvis_Bst Site: Pelvis Technique: Isodose Plan Mode: Photon Dose Per Fraction: 4 Gy Prescribed Dose (Delivered / Prescribed): 12 Gy / 12 Gy Prescribed Fxs (Delivered / Prescribed): 3 / 3       ALLERGIES:  is allergic to gadolinium derivatives, erythromycin, lisinopril, penicillins, and adhesive [tape].  MEDICATIONS:  Current Outpatient Medications  Medication Sig Dispense Refill  . abemaciclib (VERZENIO) 50 MG tablet Take 1 tablet (50 mg total) by mouth 2 (two) times daily. Swallow tablets whole. Do not chew, crush, or split tablets before swallowing. 56 tablet 5  . amLODipine (NORVASC) 5 MG tablet Take 1 tablet (5 mg total) by mouth daily. Needs ov before any more refills 90 tablet 1  . carvedilol (COREG) 25 MG tablet Take 1 tablet (25 mg total) by mouth 2 (two) times daily with a  meal. Due for appt 03/2023 180 tablet 1  . EPINEPHRINE 0.3 mg/0.3 mL IJ SOAJ injection INJECT 0.3 ML(1 SYRINGE) IN THE MUSCLE AS NEEDED FOR ALLERGIC REACTION 2 Device 0  . gabapentin (NEURONTIN) 300 MG capsule Take 1 capsule (300 mg total) by mouth 2 (two) times daily. (Patient taking differently: Take 300 mg by mouth 2 (two) times daily.) 180 capsule 1  . HYDROcodone-acetaminophen (NORCO) 5-325 MG tablet Take 1-2 tablets by mouth every 4 (four) hours as needed. 60 tablet 0  . letrozole (FEMARA) 2.5 MG tablet Take 1 tablet (2.5 mg total) by mouth daily. 90 tablet 3  . meloxicam (MOBIC) 15 MG tablet Take 1 tablet (15 mg total) by mouth daily as needed for pain. (Patient taking differently: Take 15 mg by mouth daily as needed for pain.) 90 tablet 1  . ondansetron (ZOFRAN) 8 MG tablet 1/2 to 1 tablet q 12 hours prn nausea 20 tablet 3  . prochlorperazine (COMPAZINE) 10 MG tablet Take 1 tablet (10 mg total) by mouth every 6 (six) hours as needed for nausea or vomiting. 30 tablet 2  . silver sulfADIAZINE (SILVADENE) 1 % cream  Apply 1 Application topically 2 (two) times daily. 50 g 0  . SYNTHROID 100 MCG tablet Take 1 tablet (100 mcg total) by mouth daily before breakfast. 90 tablet 1  . valACYclovir (VALTREX) 1000 MG tablet TAKE 1 TABLET BY MOUTH THREE TIMES DAILY AS NEEDED (Patient taking differently: Take 1,000 mg by mouth as needed. Takes 1 tablet by mouth daily as needed for cold sores) 30 tablet 2   No current facility-administered medications for this visit.    VITAL SIGNS: LMP 01/28/2012  There were no vitals filed for this visit.  Estimated body mass index is 35.96 kg/m as calculated from the following:   Height as of 06/10/23: 5\' 6"  (1.676 m).   Weight as of 06/16/23: 222 lb 12.8 oz (101.1 kg).  LABS: CBC:    Component Value Date/Time   WBC 2.2 (L) 06/16/2023 1459   WBC 3.6 (L) 05/21/2023 1047   HGB 11.9 (L) 06/16/2023 1459   HCT 34.9 (L) 06/16/2023 1459   PLT 102 (L) 06/16/2023 1459    MCV 84.7 06/16/2023 1459   NEUTROABS 1.9 06/16/2023 1459   LYMPHSABS 0.1 (L) 06/16/2023 1459   MONOABS 0.1 06/16/2023 1459   EOSABS 0.0 06/16/2023 1459   BASOSABS 0.0 06/16/2023 1459   Comprehensive Metabolic Panel:    Component Value Date/Time   NA 141 06/16/2023 1459   K 3.4 (L) 06/16/2023 1459   CL 105 06/16/2023 1459   CO2 27 06/16/2023 1459   BUN 10 06/16/2023 1459   CREATININE 0.76 06/16/2023 1459   CREATININE 0.94 04/09/2023 1413   GLUCOSE 110 (H) 06/16/2023 1459   CALCIUM 8.2 (L) 06/16/2023 1459   AST 17 06/16/2023 1459   ALT 18 06/16/2023 1459   ALKPHOS 208 (H) 06/16/2023 1459   BILITOT 0.9 06/16/2023 1459   PROT 6.1 (L) 06/16/2023 1459   ALBUMIN 3.3 (L) 06/16/2023 1459    RADIOGRAPHIC STUDIES: MR BRAIN WO CONTRAST  Result Date: 07/01/2023 CLINICAL DATA:  Brain metastases suspected 3T SRS Protocol. EXAM: MRI HEAD WITHOUT CONTRAST TECHNIQUE: Multiplanar, multiecho pulse sequences of the brain and surrounding structures were obtained without intravenous contrast. IV contrast was not administered due to previous allergic reaction. COMPARISON:  None Available. FINDINGS: Brain: No acute infarct or hemorrhage. Scattered foci of T2 hyperintensity in the cerebral white matter are favored to reflect sequela of mild chronic small-vessel disease. Mild generalized volume loss. No mass or midline shift. No hydrocephalus or extra-axial collection. No abnormal susceptibility. Vascular: Normal flow voids. Skull and upper cervical spine: Numerous T2 hyperintense marrow lesions throughout the skull with associated diffusion restriction, suspicious for metastases. No definite evidence of epidural extension of tumor. Additional T2 hyperintense lesions are seen in the left occipital condyle and base of the C2 vertebral body. Sinuses/Orbits: Unremarkable. Other: None. IMPRESSION: 1. Numerous T2 hyperintense marrow lesions throughout the skull, skull base and upper cervical spine, with associated  diffusion restriction, suspicious for metastases. No definite evidence of epidural extension of tumor. 2. No evidence of metastatic disease to the brain within the limits of noncontrast examination. Electronically Signed   By: Orvan Falconer M.D.   On: 07/01/2023 14:37    PERFORMANCE STATUS (ECOG) : {CHL ONC ECOG [id number]}  Review of Systems Unless otherwise noted, a complete review of systems is negative.  Physical Exam General: NAD Cardiovascular: regular rate and rhythm Pulmonary: clear ant fields Abdomen: soft, nontender, + bowel sounds Extremities: no edema, no joint deformities Skin: no rashes Neurological: Alert and oriented x3  IMPRESSION: *** I  introduced myself, Shuntae Herzig RN, and Palliative's role in collaboration with the oncology team. Concept of Palliative Care was introduced as specialized medical care for people and their families living with serious illness.  It focuses on providing relief from the symptoms and stress of a serious illness.  The goal is to improve quality of life for both the patient and the family. Values and goals of care important to patient and family were attempted to be elicited.    We discussed *** current illness and what it means in the larger context of *** on-going co-morbidities. Natural disease trajectory and expectations were discussed.  I discussed the importance of continued conversation with family and their medical providers regarding overall plan of care and treatment options, ensuring decisions are within the context of the patients values and GOCs.  PLAN: Established therapeutic relationship. Education provided on palliative's role in collaboration with their Oncology/Radiation team. I will plan to see patient back in 2-4 weeks in collaboration to other oncology appointments.    Patient expressed understanding and was in agreement with this plan. She also understands that She can call the clinic at any time with any questions,  concerns, or complaints.   Thank you for your referral and allowing Palliative to assist in Mrs. Gaylon Mccully's care.   Number and complexity of problems addressed: ***HIGH - 1 or more chronic illnesses with SEVERE exacerbation, progression, or side effects of treatment - advanced cancer, pain. Any controlled substances utilized were prescribed in the context of palliative care.   Visit consisted of counseling and education dealing with the complex and emotionally intense issues of symptom management and palliative care in the setting of serious and potentially life-threatening illness.  Signed by: Willette Alma, AGPCNP-BC Palliative Medicine Team/[facility]   *Please note that this is a verbal dictation therefore any spelling or grammatical errors are due to the "[employer] One" system interpretation.

## 2023-07-05 NOTE — Telephone Encounter (Signed)
Pt called to reschedule palliative appt, pt made aware of schedule changes no further needs at this time.

## 2023-07-07 ENCOUNTER — Inpatient Hospital Stay: Payer: BC Managed Care – PPO

## 2023-07-07 ENCOUNTER — Encounter: Payer: Self-pay | Admitting: Hematology and Oncology

## 2023-07-07 NOTE — Telephone Encounter (Signed)
Telephone call  

## 2023-07-12 NOTE — Progress Notes (Unsigned)
Palliative Medicine [facility]  [phone] [phone]   Name: Kayla Banks Date: 07/12/2023 MRN: [id number]  DOB: 04-17-1956  Patient Care Team: Zola Button, Grayling Congress, DO as PCP - General Jodi Geralds, MD as Consulting Physician (Orthopedic Surgery) Harriette Bouillon, MD as Consulting Physician (General Surgery) Serena Croissant, MD as Consulting Physician (Hematology and Oncology) Dorothy Puffer, MD as Consulting Physician (Radiation Oncology) Jethro Bolus, MD as Consulting Physician (Ophthalmology) Anselm Lis, RPH-CPP as Pharmacist (Hematology and Oncology)    REASON FOR CONSULTATION: Daniel Desjardin is a 67 y.o. 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  Palliative ask to see for symptom management and goals of care.    SOCIAL HISTORY:     reports that she has never smoked. She has never used smokeless tobacco. She reports that she does not currently use alcohol. She reports that she does not use drugs.  ADVANCE DIRECTIVES:  None on file  CODE STATUS: Full code  PAST MEDICAL HISTORY: Past Medical History:  Diagnosis Date  . Breast cancer (HCC)   . Complication of anesthesia   . Family history of breast cancer   . Heart murmur    no problems, saw cardiologist 2010  . Hypertension    on meds  . Hypothyroidism   . Osteoarthritis   . Personal history of chemotherapy   . Personal history of radiation therapy    left  . Plantar fasciitis   . PONV (postoperative nausea and vomiting)   . Thyroid disease    Hypothyroidism    PAST SURGICAL HISTORY:  Past Surgical History:  Procedure Laterality Date  . ABDOMINAL HYSTERECTOMY  02/2012  . AXILLARY LYMPH NODE DISSECTION Left 11/09/2018   Procedure: LEFT AXILLARY LYMPH NODE DISSECTION;  Surgeon: Harriette Bouillon, MD;  Location: [facility];  Service: General;  Laterality: Left;  . BREAST BIOPSY Left    malignant  . 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/11/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  . BREAST LUMPECTOMY WITH RADIOACTIVE SEED AND SENTINEL LYMPH NODE BIOPSY Left 10/11/2018   Procedure: LEFT BREAST LUMPECTOMY WITH RADIOACTIVE SEED AND LEFT SENTINEL LYMPH NODE MAPPING WITH LEFT TARGETED LYMPH NODE BIOPSY;  Surgeon: Harriette Bouillon, MD;  Location: [facility];  Service: General;  Laterality: Left;  . BRONCHIAL NEEDLE ASPIRATION BIOPSY  05/13/2023   Procedure: BRONCHIAL NEEDLE ASPIRATION BIOPSIES;  Surgeon: Lorin Glass, MD;  Location: [facility];  Service: Pulmonary;;  . CATARACT EXTRACTION Bilateral 11/3  06/26/20   shipiro   . FOOT SURGERY Left    bone spurs  . LUMBAR LAMINECTOMY  1990  . PORT-A-CATH REMOVAL N/A 05/23/2019   Procedure: PORT REMOVAL;  Surgeon: Harriette Bouillon, MD;  Location: [facility];  Service: General;  Laterality: N/A;  . PORTACATH PLACEMENT Right 12/01/2018   Procedure: INSERTION PORT-A-CATH WITH ULTRASOUND;  Surgeon: Harriette Bouillon, MD;  Location: [facility];  Service: General;  Laterality: Right;  Marland Kitchen VIDEO BRONCHOSCOPY WITH ENDOBRONCHIAL ULTRASOUND Bilateral 05/13/2023   Procedure: VIDEO BRONCHOSCOPY WITH ENDOBRONCHIAL ULTRASOUND;  Surgeon: Lorin Glass, MD;  Location: [facility];  Service: Pulmonary;  Laterality: Bilateral;    HEMATOLOGY/ONCOLOGY HISTORY:  Oncology History  Malignant neoplasm of upper-outer quadrant of left breast in female, estrogen receptor positive (HCC)  08/24/2018 Initial Biopsy   Screening detected left breast mass at 2:30 position 6 mm in size  with one abnormal lymph node and stable benign calcifications, biopsy of the mass grade 2 IDC with DCIS and lymphovascular invasion, lymph node biopsy is positive, ER 95%, PR 40%, Ki-67 20%, HER-2 -1+ by IHC, T1b N1 stage Ib   08/31/2018 Cancer Staging   Staging form:  Breast, AJCC 8th Edition - Clinical: Stage IB (cT1b, cN1(f), cM0, G2, ER+, PR+, HER2-) - Signed by Serena Croissant, MD on 08/31/2018   09/21/2018 Genetic Testing   Genetic testing performed through [facility]'s Common Hereditary Cancers Panel reported out on 09/19/2018 showed no pathogenic mutations.  The Common Hereditary Cancer Panel offered by [facility] includes sequencing and/or deletion duplication testing of the following 53 genes: APC, ATM, AXIN2, BARD1, BMPR1A, BRCA1, BRCA2, BRIP1, BUB1B, CDH1, CDK4, CDKN2A, CHEK2, CTNNA1, DICER1, ENG, EPCAM, GALNT12, GREM1, HOXB13, KIT, MEN1, MLH1, MLH3, MSH2, MSH3, MSH6, MUTYH, NBN, NF1, NTHL1, PALB2, PDGFRA, PMS2, POLD1, POLE, PTEN, RAD50, RAD51C, RAD51D, RNF43, RPS20, SDHA, SDHB, SDHC, SDHD, SMAD4, SMARCA4, STK11, TP53, TSC1, TSC2, VHL.   A variant of uncertain significance (VUS) in a gene called NBN was also noted. c.1979G>C (p.Arg660Thr)   10/18/2018 Cancer Staging   Staging form: Breast, AJCC 8th Edition - Pathologic stage from 10/18/2018: Stage IB (pT1b, pN2a, cM0, G2, ER+, PR+, HER2-) - Signed by Serena Croissant, MD on 11/14/2018   11/09/2018 Surgery   Left lumpectomy ([facility]) [phone]): Grade 2 IDC with DCIS, 1 cm, margins negative, DCIS focally 0.1 cm from superior margin, 2/2 lymph nodes positive, ER 95%, PR 45%, HER-2 negative, Ki-67 20% AXLND: 3/20 LN Pos (total 5/22)   12/15/2018 -  Chemotherapy   DOXOrubicin (ADRIAMYCIN) chemo injection 142 mg, 60 mg/m2 = 142 mg, Intravenous,  Once, 4 of 4 cycles. Administration: 142 mg (12/15/2018), 142 mg (12/29/2018), 142 mg (01/12/2019), 142 mg (01/26/2019)  palonosetron (ALOXI) injection 0.25 mg, 0.25 mg, Intravenous,  Once, 4 of 4 cycles. Administration: 0.25 mg (12/15/2018), 0.25 mg (12/29/2018), 0.25 mg (01/12/2019), 0.25 mg (01/26/2019)  pegfilgrastim-cbqv (UDENYCA) injection 6 mg, 6 mg, Subcutaneous, Once, 4 of 4 cycles. Administration: 6 mg (12/17/2018), 6 mg (12/31/2018), 6 mg (01/14/2019), 6 mg  (01/28/2019)  cyclophosphamide (CYTOXAN) 1,420 mg in sodium chloride 0.9 % 250 mL chemo infusion, 600 mg/m2 = 1,420 mg, Intravenous,  Once, 4 of 4 cycles. Administration: 1,420 mg (12/15/2018), 1,420 mg (12/29/2018), 1,420 mg (01/12/2019), 1,420 mg (01/26/2019)  PACLitaxel (TAXOL) 192 mg in sodium chloride 0.9 % 250 mL chemo infusion (</= 80mg /m2), 80 mg/m2 = 192 mg, Intravenous,  Once, 11 of 12 cycles. Dose modification: 60 mg/m2 (original dose 80 mg/m2, Cycle 12, Reason: Dose not tolerated). Administration: 192 mg (02/09/2019), 192 mg (02/16/2019), 192 mg (02/23/2019), 192 mg (03/02/2019), 192 mg (03/09/2019), 192 mg (03/16/2019), 192 mg (03/23/2019), 144 mg (03/30/2019), 144 mg (04/06/2019), 144 mg (04/14/2019), 144 mg (04/20/2019)  fosaprepitant (EMEND) 150 mg  dexamethasone (DECADRON) 12 mg in sodium chloride 0.9 % 145 mL IVPB, , Intravenous,  Once, 4 of 4 cycles Administration:  (12/15/2018),  (12/29/2018),  (01/12/2019),  (01/26/2019)     06/01/2019 - 07/21/2019 Radiation Therapy   The patient initially received a dose of 50.4 Gy in 28 fractions to the breast using whole-breast tangent fields. This was delivered using a 3-D conformal technique. The pt received a boost delivering an additional 10 Gy in 5 fractions using a electron boost with electrons. The total dose was 60.4 Gy.    08/2019 - 04/2023 Anti-estrogen oral therapy   Anastrozole   04/02/2023 Relapse/Recurrence   CT CAP 04/02/2023: Small bilateral pleural  effusions, multiple small bilateral pulmonary nodules measuring up to 5 mm, right paratracheal and right hilar lymph nodes indeterminate.  Numerous lytic lesions in axial and appendical skeleton concerning for metastatic disease or myeloma.  Possible acute left seventh eighth ninth rib fractures hypodensities within the liver subcentimeter     04/30/2023 PET scan   PET/CT scan 04/30/2023: Widespread bone metastases cervical thoracic lumbar spine sternum ribs shoulders and pelvis, hypermetabolic  right paratracheal and right hilar lymphadenopathy, tiny pulmonary nodules: Patient has appointment to see Dr. Tonia Brooms for bronchoscopy and biopsy.   05/07/2023 Treatment Plan Change   Letrozole daily, Caffie Damme   05/10/2023 - 06/03/2023 Radiation Therapy   Plan Name: Chest_R_Ribs Site: Ribs, Right Technique: Isodose Plan Mode: Photon Dose Per Fraction: 3 Gy Prescribed Dose (Delivered / Prescribed): 30 Gy / 30 Gy Prescribed Fxs (Delivered / Prescribed): 10 / 10   Plan Name: Chest_L_Ribs Site: Ribs, Left Technique: Isodose Plan Mode: Photon Dose Per Fraction: 3 Gy Prescribed Dose (Delivered / Prescribed): 30 Gy / 30 Gy Prescribed Fxs (Delivered / Prescribed): 10 / 10   Plan Name: Pelvis Site: [place], Right Technique: Isodose Plan Mode: Photon Dose Per Fraction: 3 Gy Prescribed Dose (Delivered / Prescribed): 21 Gy / 21 Gy Prescribed Fxs (Delivered / Prescribed): 7 / 7   Plan Name: Pelvis:1 Site: [place], Right Technique: Isodose Plan Mode: Photon Dose Per Fraction: 3 Gy Prescribed Dose (Delivered / Prescribed): 9 Gy / 9 Gy Prescribed Fxs (Delivered / Prescribed): 3 / 3   Plan Name: Pelvis_Bst Site: Pelvis Technique: Isodose Plan Mode: Photon Dose Per Fraction: 4 Gy Prescribed Dose (Delivered / Prescribed): 12 Gy / 12 Gy Prescribed Fxs (Delivered / Prescribed): 3 / 3       ALLERGIES:  is allergic to gadolinium derivatives, erythromycin, lisinopril, penicillins, and adhesive [tape].  MEDICATIONS:  Current Outpatient Medications  Medication Sig Dispense Refill  . abemaciclib (VERZENIO) 50 MG tablet Take 1 tablet (50 mg total) by mouth 2 (two) times daily. Swallow tablets whole. Do not chew, crush, or split tablets before swallowing. 56 tablet 5  . amLODipine (NORVASC) 5 MG tablet Take 1 tablet (5 mg total) by mouth daily. Needs ov before any more refills 90 tablet 1  . carvedilol (COREG) 25 MG tablet Take 1 tablet (25 mg total) by mouth 2 (two) times daily with a  meal. Due for appt 03/2023 180 tablet 1  . EPINEPHRINE 0.3 mg/0.3 mL IJ SOAJ injection INJECT 0.3 ML(1 SYRINGE) IN THE MUSCLE AS NEEDED FOR ALLERGIC REACTION 2 Device 0  . gabapentin (NEURONTIN) 300 MG capsule Take 1 capsule (300 mg total) by mouth 2 (two) times daily. (Patient taking differently: Take 300 mg by mouth 2 (two) times daily.) 180 capsule 1  . HYDROcodone-acetaminophen (NORCO) 5-325 MG tablet Take 1-2 tablets by mouth every 4 (four) hours as needed. 60 tablet 0  . letrozole (FEMARA) 2.5 MG tablet Take 1 tablet (2.5 mg total) by mouth daily. 90 tablet 3  . meloxicam (MOBIC) 15 MG tablet Take 1 tablet (15 mg total) by mouth daily as needed for pain. (Patient taking differently: Take 15 mg by mouth daily as needed for pain.) 90 tablet 1  . ondansetron (ZOFRAN) 8 MG tablet 1/2 to 1 tablet q 12 hours prn nausea 20 tablet 3  . prochlorperazine (COMPAZINE) 10 MG tablet Take 1 tablet (10 mg total) by mouth every 6 (six) hours as needed for nausea or vomiting. 30 tablet 2  . silver sulfADIAZINE (SILVADENE) 1 % cream  Apply 1 Application topically 2 (two) times daily. 50 g 0  . SYNTHROID 100 MCG tablet Take 1 tablet (100 mcg total) by mouth daily before breakfast. 90 tablet 1  . valACYclovir (VALTREX) 1000 MG tablet TAKE 1 TABLET BY MOUTH THREE TIMES DAILY AS NEEDED (Patient taking differently: Take 1,000 mg by mouth as needed. Takes 1 tablet by mouth daily as needed for cold sores) 30 tablet 2   No current facility-administered medications for this visit.    VITAL SIGNS: LMP 01/28/2012  There were no vitals filed for this visit.  Estimated body mass index is 35.96 kg/m as calculated from the following:   Height as of 06/10/23: 5\' 6"  (1.676 m).   Weight as of 06/16/23: 222 lb 12.8 oz (101.1 kg).  LABS: CBC:    Component Value Date/Time   WBC 2.2 (L) 06/16/2023 1459   WBC 3.6 (L) 05/21/2023 1047   HGB 11.9 (L) 06/16/2023 1459   HCT 34.9 (L) 06/16/2023 1459   PLT 102 (L) 06/16/2023 1459    MCV 84.7 06/16/2023 1459   NEUTROABS 1.9 06/16/2023 1459   LYMPHSABS 0.1 (L) 06/16/2023 1459   MONOABS 0.1 06/16/2023 1459   EOSABS 0.0 06/16/2023 1459   BASOSABS 0.0 06/16/2023 1459   Comprehensive Metabolic Panel:    Component Value Date/Time   NA 141 06/16/2023 1459   K 3.4 (L) 06/16/2023 1459   CL 105 06/16/2023 1459   CO2 27 06/16/2023 1459   BUN 10 06/16/2023 1459   CREATININE 0.76 06/16/2023 1459   CREATININE 0.94 04/09/2023 1413   GLUCOSE 110 (H) 06/16/2023 1459   CALCIUM 8.2 (L) 06/16/2023 1459   AST 17 06/16/2023 1459   ALT 18 06/16/2023 1459   ALKPHOS 208 (H) 06/16/2023 1459   BILITOT 0.9 06/16/2023 1459   PROT 6.1 (L) 06/16/2023 1459   ALBUMIN 3.3 (L) 06/16/2023 1459    RADIOGRAPHIC STUDIES: MR BRAIN WO CONTRAST  Result Date: 07/01/2023 CLINICAL DATA:  Brain metastases suspected 3T SRS Protocol. EXAM: MRI HEAD WITHOUT CONTRAST TECHNIQUE: Multiplanar, multiecho pulse sequences of the brain and surrounding structures were obtained without intravenous contrast. IV contrast was not administered due to previous allergic reaction. COMPARISON:  None Available. FINDINGS: Brain: No acute infarct or hemorrhage. Scattered foci of T2 hyperintensity in the cerebral white matter are favored to reflect sequela of mild chronic small-vessel disease. Mild generalized volume loss. No mass or midline shift. No hydrocephalus or extra-axial collection. No abnormal susceptibility. Vascular: Normal flow voids. Skull and upper cervical spine: Numerous T2 hyperintense marrow lesions throughout the skull with associated diffusion restriction, suspicious for metastases. No definite evidence of epidural extension of tumor. Additional T2 hyperintense lesions are seen in the left occipital condyle and base of the C2 vertebral body. Sinuses/Orbits: Unremarkable. Other: None. IMPRESSION: 1. Numerous T2 hyperintense marrow lesions throughout the skull, skull base and upper cervical spine, with associated  diffusion restriction, suspicious for metastases. No definite evidence of epidural extension of tumor. 2. No evidence of metastatic disease to the brain within the limits of noncontrast examination. Electronically Signed   By: Orvan Falconer M.D.   On: 07/01/2023 14:37    PERFORMANCE STATUS (ECOG) : {CHL ONC ECOG IO:[id number]}  Review of Systems Unless otherwise noted, a complete review of systems is negative.  Physical Exam General: NAD Cardiovascular: regular rate and rhythm Pulmonary: clear ant fields Abdomen: soft, nontender, + bowel sounds Extremities: no edema, no joint deformities Skin: no rashes Neurological: Alert and oriented x3  IMPRESSION: *** I  introduced myself, Maygan RN, and Palliative's role in collaboration with the oncology team. Concept of Palliative Care was introduced as specialized medical care for people and their families living with serious illness.  It focuses on providing relief from the symptoms and stress of a serious illness.  The goal is to improve quality of life for both the patient and the family. Values and goals of care important to patient and family were attempted to be elicited.    We discussed *** current illness and what it means in the larger context of *** on-going co-morbidities. Natural disease trajectory and expectations were discussed.  I discussed the importance of continued conversation with family and their medical providers regarding overall plan of care and treatment options, ensuring decisions are within the context of the patients values and GOCs.  PLAN: Established therapeutic relationship. Education provided on palliative's role in collaboration with their Oncology/Radiation team. I will plan to see patient back in 2-4 weeks in collaboration to other oncology appointments.    Patient expressed understanding and was in agreement with this plan. She also understands that She can call the clinic at any time with any questions,  concerns, or complaints.   Thank you for your referral and allowing Palliative to assist in Mrs. Karlita Protzman's care.   Number and complexity of problems addressed: ***HIGH - 1 or more chronic illnesses with SEVERE exacerbation, progression, or side effects of treatment - advanced cancer, pain. Any controlled substances utilized were prescribed in the context of palliative care.   Visit consisted of counseling and education dealing with the complex and emotionally intense issues of symptom management and palliative care in the setting of serious and potentially life-threatening illness.  Signed by: Willette Alma, AGPCNP-BC Palliative Medicine Team/[facility]   *Please note that this is a verbal dictation therefore any spelling or grammatical errors are due to the "[employer] One" system interpretation.

## 2023-07-13 ENCOUNTER — Other Ambulatory Visit (HOSPITAL_COMMUNITY): Payer: Self-pay

## 2023-07-13 ENCOUNTER — Other Ambulatory Visit: Payer: Self-pay

## 2023-07-13 ENCOUNTER — Inpatient Hospital Stay: Payer: BC Managed Care – PPO | Admitting: Pharmacist

## 2023-07-13 ENCOUNTER — Inpatient Hospital Stay: Payer: BC Managed Care – PPO

## 2023-07-13 ENCOUNTER — Encounter: Payer: Self-pay | Admitting: Nurse Practitioner

## 2023-07-13 ENCOUNTER — Inpatient Hospital Stay (HOSPITAL_BASED_OUTPATIENT_CLINIC_OR_DEPARTMENT_OTHER): Payer: BC Managed Care – PPO | Admitting: Nurse Practitioner

## 2023-07-13 VITALS — BP 152/85 | HR 101 | Temp 97.7°F | Resp 16 | Wt 228.2 lb

## 2023-07-13 VITALS — BP 150/80 | HR 103 | Temp 97.8°F | Resp 18 | Ht 66.0 in | Wt 227.0 lb

## 2023-07-13 DIAGNOSIS — C7951 Secondary malignant neoplasm of bone: Secondary | ICD-10-CM

## 2023-07-13 DIAGNOSIS — Z515 Encounter for palliative care: Secondary | ICD-10-CM

## 2023-07-13 DIAGNOSIS — G629 Polyneuropathy, unspecified: Secondary | ICD-10-CM

## 2023-07-13 DIAGNOSIS — C50412 Malignant neoplasm of upper-outer quadrant of left female breast: Secondary | ICD-10-CM

## 2023-07-13 DIAGNOSIS — M792 Neuralgia and neuritis, unspecified: Secondary | ICD-10-CM

## 2023-07-13 DIAGNOSIS — S2249XA Multiple fractures of ribs, unspecified side, initial encounter for closed fracture: Secondary | ICD-10-CM | POA: Diagnosis not present

## 2023-07-13 DIAGNOSIS — Z923 Personal history of irradiation: Secondary | ICD-10-CM | POA: Diagnosis not present

## 2023-07-13 DIAGNOSIS — Z170 Estrogen receptor positive status [ER+]: Secondary | ICD-10-CM

## 2023-07-13 DIAGNOSIS — Z9221 Personal history of antineoplastic chemotherapy: Secondary | ICD-10-CM | POA: Diagnosis not present

## 2023-07-13 DIAGNOSIS — R530 Neoplastic (malignant) related fatigue: Secondary | ICD-10-CM

## 2023-07-13 DIAGNOSIS — G893 Neoplasm related pain (acute) (chronic): Secondary | ICD-10-CM

## 2023-07-13 DIAGNOSIS — Z79811 Long term (current) use of aromatase inhibitors: Secondary | ICD-10-CM | POA: Diagnosis not present

## 2023-07-13 LAB — CMP (CANCER CENTER ONLY)
ALT: 11 U/L (ref 0–44)
AST: 18 U/L (ref 15–41)
Albumin: 3.6 g/dL (ref 3.5–5.0)
Alkaline Phosphatase: 250 U/L — ABNORMAL HIGH (ref 38–126)
Anion gap: 4 — ABNORMAL LOW (ref 5–15)
BUN: 7 mg/dL — ABNORMAL LOW (ref 8–23)
CO2: 29 mmol/L (ref 22–32)
Calcium: 8.6 mg/dL — ABNORMAL LOW (ref 8.9–10.3)
Chloride: 109 mmol/L (ref 98–111)
Creatinine: 0.7 mg/dL (ref 0.44–1.00)
GFR, Estimated: >60 mL/min (ref >60)
Glucose, Bld: 110 mg/dL — ABNORMAL HIGH (ref 70–99)
Potassium: 4.7 mmol/L (ref 3.5–5.1)
Sodium: 142 mmol/L (ref 135–145)
Total Bilirubin: 1.2 mg/dL — ABNORMAL HIGH (ref <1.2)
Total Protein: 6.7 g/dL (ref 6.5–8.1)

## 2023-07-13 LAB — CBC WITH DIFFERENTIAL (CANCER CENTER ONLY)
Abs Immature Granulocytes: 0.05 10*3/uL (ref 0.00–0.07)
Basophils Absolute: 0.1 10*3/uL (ref 0.0–0.1)
Basophils Relative: 1 %
Eosinophils Absolute: 0.1 10*3/uL (ref 0.0–0.5)
Eosinophils Relative: 1 %
HCT: 34.3 % — ABNORMAL LOW (ref 36.0–46.0)
Hemoglobin: 10.7 g/dL — ABNORMAL LOW (ref 12.0–15.0)
Immature Granulocytes: 1 %
Lymphocytes Relative: 12 %
Lymphs Abs: 0.7 10*3/uL (ref 0.7–4.0)
MCH: 29.6 pg (ref 26.0–34.0)
MCHC: 31.2 g/dL (ref 30.0–36.0)
MCV: 94.8 fL (ref 80.0–100.0)
Monocytes Absolute: 0.4 10*3/uL (ref 0.1–1.0)
Monocytes Relative: 7 %
Neutro Abs: 4.5 10*3/uL (ref 1.7–7.7)
Neutrophils Relative %: 78 %
Platelet Count: 202 10*3/uL (ref 150–400)
RBC: 3.62 MIL/uL — ABNORMAL LOW (ref 3.87–5.11)
RDW: 22.7 % — ABNORMAL HIGH (ref 11.5–15.5)
WBC Count: 5.8 10*3/uL (ref 4.0–10.5)
nRBC: 0 % (ref 0.0–0.2)

## 2023-07-13 MED ORDER — GABAPENTIN 300 MG PO CAPS: 300.0000 mg | ORAL_CAPSULE | Freq: Two times a day (BID) | ORAL | 1 refills | Status: AC

## 2023-07-13 MED ORDER — HYDROCODONE-ACETAMINOPHEN 5-325 MG PO TABS: 1.0000 | ORAL_TABLET | ORAL | 0 refills | Status: DC | PRN

## 2023-07-13 NOTE — Progress Notes (Signed)
Specialty Pharmacy Refill Coordination Note  Kayla Banks is a 67 y.o. female contacted today regarding refills of specialty medication(s) Abemaciclib   Patient requested Delivery   Delivery date: 07/23/23   Verified address: [address] [place] [address]   Medication will be filled on 07/22/23.

## 2023-07-13 NOTE — Progress Notes (Signed)
[facility]       Telephone: [phone]?Fax: [phone]   Oncology Clinical Pharmacist Practitioner Progress Note  Kayla Banks was contacted via in-person to discuss her chemotherapy regimen for abemaciclib which they receive under the care of Dr. Serena Croissant. Today she presents to clinic alone.    Current treatment regimen and start date Abemaciclib (05/10/23) 50 mg BID (06/22/23) 100 mg BID (05/10/23-06/10/23) Letrozole (05/04/23) Denosumab 120 mg (05/17/23)   Interval History She has been holding her abemaciclib 100 mg dose since her last visit on 06/10/23. Abemaciclib is given in combination with letrozole and denosumab. Therapy is planned to continue until disease progression or unacceptable toxicity. She last saw Dr. Pamelia Hoit on 07/02/23 and last saw clinical pharmacy on 06/16/23.  Response to Therapy She is tolerating the lower dose of abemaciclib much better. She feels "like herself" again. No N/V/D. Corrected calcium is now WNL. Will monitor alk phos and bilirubin. Asymptomatic and grade 1 toxicities currently. Gave radiation scheduling information for planned CT CAP on 08/01/23. She will see Dr. Pamelia Hoit with labs and denosumab on 08/09/23. Dr. Pamelia Hoit will likely have clinical pharmacy see her after that. Labs, vitals, treatment parameters, and manufacturer guidelines assessing toxicity were reviewed with Kayla Banks today. Based on these values, patient is in agreement to continue abemaciclib therapy at this time.  Allergies Allergies  Allergen Reactions   Gadolinium Derivatives Anaphylaxis    MRI gadolinium contrast anaphylaxis with hives, facial swelling, wheezing, SOB, hypoxia   Erythromycin Swelling    Lip swelling; angioedema   Lisinopril Swelling    REACTION: angioedema   Penicillins Hives and Swelling    Swelling in arms & hands   Adhesive [Tape] Rash    Vitals    07/13/2023    1:43 PM 06/16/2023    3:07 PM 06/10/2023    1:12 PM   Oncology Vitals  Height   168 cm  Weight 103.511 kg 101.061 kg 100.29 kg  Weight (lbs) 228 lbs 3 oz 222 lbs 13 oz 221 lbs 2 oz  BMI 36.83 kg/m2 35.96 kg/m2 35.69 kg/m2  Temp 97.7 F (36.5 C) 97 F (36.1 C) 97.3 F (36.3 C)  Pulse Rate 101 94 113  BP 152/85 119/70 121/71  Resp 16 18 18   SpO2 98 % 100 % 100 %  BSA (m2) 2.2 m2 2.17 m2 2.16 m2    Laboratory Data    Latest Ref Rng & Units 07/13/2023    1:20 PM 06/16/2023    2:59 PM 06/10/2023   12:28 PM  CBC EXTENDED  WBC 4.0 - 10.5 K/uL 5.8  2.2  1.9   RBC 3.87 - 5.11 MIL/uL 3.62  4.12  4.46   Hemoglobin 12.0 - 15.0 g/dL 13.0  86.5  78.4   HCT 36.0 - 46.0 % 34.3  34.9  37.6   Platelets 150 - 400 K/uL 202  102  63   NEUT# 1.7 - 7.7 K/uL 4.5  1.9  1.7   Lymph# 0.7 - 4.0 K/uL 0.7  0.1  0.1        Latest Ref Rng & Units 07/13/2023    1:20 PM 06/16/2023    2:59 PM 06/10/2023   12:28 PM  CMP  Glucose 70 - 99 mg/dL 696  295  284   BUN 8 - 23 mg/dL 7  10  15    Creatinine 0.44 - 1.00 mg/dL 1.32  4.40  1.02   Sodium 135 - 145 mmol/L 142  141  140   Potassium 3.5 - 5.1 mmol/L 4.7  3.4  3.6   Chloride 98 - 111 mmol/L 109  105  105   CO2 22 - 32 mmol/L 29  27  25    Calcium 8.9 - 10.3 mg/dL 8.6  8.2  8.3   Total Protein 6.5 - 8.1 g/dL 6.7  6.1  6.1   Total Bilirubin <1.2 mg/dL 1.2  0.9  1.4   Alkaline Phos 38 - 126 U/L 250  208  152   AST 15 - 41 U/L 18  17  23    ALT 0 - 44 U/L 11  18  24      Adverse Effects Assessment Alk Phos: 1.98 x ULN (Grade 1) Bilirubin: 1.09 x ULN (Grade 1)  Adherence Assessment Kayla Banks reports missing 0 doses over the past 2 weeks.   Reason for missed dose: n/a Patient was re-educated on importance of adherence.   Access Assessment Kayla Banks is currently receiving her abemaciclib through [facility] concerns:  none  Medication Reconciliation The patient's medication list was reviewed today with the patient? Yes New medications or herbal  supplements have recently been started? No  Any medications have been discontinued? No  The medication list was updated and reconciled based on the patient's most recent medication list in the electronic medical record (EMR) including herbal products and OTC medications.   Medications Current Outpatient Medications  Medication Sig Dispense Refill   abemaciclib (VERZENIO) 50 MG tablet Take 1 tablet (50 mg total) by mouth 2 (two) times daily. Swallow tablets whole. Do not chew, crush, or split tablets before swallowing. 56 tablet 5   amLODipine (NORVASC) 5 MG tablet Take 1 tablet (5 mg total) by mouth daily. Needs ov before any more refills 90 tablet 1   carvedilol (COREG) 25 MG tablet Take 1 tablet (25 mg total) by mouth 2 (two) times daily with a meal. Due for appt 03/2023 180 tablet 1   EPINEPHRINE 0.3 mg/0.3 mL IJ SOAJ injection INJECT 0.3 ML(1 SYRINGE) IN THE MUSCLE AS NEEDED FOR ALLERGIC REACTION 2 Device 0   gabapentin (NEURONTIN) 300 MG capsule Take 1 capsule (300 mg total) by mouth 2 (two) times daily. (Patient taking differently: Take 300 mg by mouth 2 (two) times daily.) 180 capsule 1   HYDROcodone-acetaminophen (NORCO) 5-325 MG tablet Take 1-2 tablets by mouth every 4 (four) hours as needed. 60 tablet 0   letrozole (FEMARA) 2.5 MG tablet Take 1 tablet (2.5 mg total) by mouth daily. 90 tablet 3   meloxicam (MOBIC) 15 MG tablet Take 1 tablet (15 mg total) by mouth daily as needed for pain. (Patient taking differently: Take 15 mg by mouth daily as needed for pain.) 90 tablet 1   ondansetron (ZOFRAN) 8 MG tablet 1/2 to 1 tablet q 12 hours prn nausea 20 tablet 3   prochlorperazine (COMPAZINE) 10 MG tablet Take 1 tablet (10 mg total) by mouth every 6 (six) hours as needed for nausea or vomiting. 30 tablet 2   silver sulfADIAZINE (SILVADENE) 1 % cream Apply 1 Application topically 2 (two) times daily. 50 g 0   SYNTHROID 100 MCG tablet Take 1 tablet (100 mcg total) by mouth daily before breakfast.  90 tablet 1   valACYclovir (VALTREX) 1000 MG tablet TAKE 1 TABLET BY MOUTH THREE TIMES DAILY AS NEEDED (Patient taking differently: Take 1,000 mg by mouth as needed. Takes 1 tablet by mouth daily as needed for cold sores) 30 tablet 2  No current facility-administered medications for this visit.    Drug-Drug Interactions (DDIs) DDIs were evaluated? Yes Significant DDIs? No  The patient was instructed to speak with their health care provider and/or the oral chemotherapy pharmacist before starting any new drug, including prescription or over the counter, natural / herbal products, or vitamins.  Supportive Care Diarrhea: we reviewed that diarrhea is common with abemaciclib and confirmed that she does have loperamide (Imodium) at home.  We reviewed how to take this medication PRN. Neutropenia: we discussed the importance of having a thermometer and what the Centers for Disease Control and Prevention (CDC) considers a fever which is 100.72F (38C) or higher.  Gave patient 24/7 triage line to call if any fevers or symptoms. ILD/Pneumonitis: we reviewed potential symptoms including cough, shortness, and fatigue.  VTE: reviewed signs of DVT such as leg swelling, redness, pain, or tenderness and signs of PE such as shortness of breath, rapid or irregular heartbeat, cough, chest pain, or lightheadedness. Reviewed to take the medication every 12 hours (with food sometimes can be easier on the stomach) and to take it at the same time every day. Hepatotoxicity:monitor Alk Phos and bilirubin. Both grade 1 toxicities currently Drug interactions with grapefruit products  Dosing Assessment Hepatic adjustments needed? No  Renal adjustments needed? No  Toxicity adjustments needed? No  The current dosing regimen is appropriate to continue at this time.  Follow-Up Plan Continue abemaciclib 50 mg by mouth every 12 hours.  Continue letrozole 2.5 mg by mouth daily Continue denosumab 120 mg SubQ every 12 weeks.  Next due 08/09/23 Monitor bilirubin and alk phos. Both grade 1 toxicities currently. Corrected calcium WNL, estimated at 8.92 U/L Add labs, Dr. Pamelia Hoit visit, denosumab 120 mg on 08/10/23 CT CAP ordered by Dr. Pamelia Hoit with expected date of 08/01/23  Kayla Banks participated in the discussion, expressed understanding, and voiced agreement with the above plan. All questions were answered to her satisfaction. The patient was advised to contact the clinic at [phone] with any questions or concerns prior to her return visit.   I spent 30 minutes assessing and educating the patient.  Chantille Navarrete A. Odetta Pink, PharmD, BCOP, CPP  Anselm Lis, RPH-CPP, 07/13/2023  2:44 PM   **Disclaimer: This note was dictated with voice recognition software. Similar sounding words can inadvertently be transcribed and this note may contain transcription errors which may not have been corrected upon publication of note.**

## 2023-07-14 ENCOUNTER — Encounter: Payer: Self-pay | Admitting: Hematology and Oncology

## 2023-07-16 NOTE — Progress Notes (Deleted)
Palliative Medicine [facility]  [phone] [phone]   Name: Kayla Banks Date: 07/16/2023 MRN: [id number]  DOB: 12/07/1955  Patient Care Team: Zola Button, Grayling Congress, DO as PCP - General Jodi Geralds, MD as Consulting Physician (Orthopedic Surgery) Harriette Bouillon, MD as Consulting Physician (General Surgery) Serena Croissant, MD as Consulting Physician (Hematology and Oncology) Dorothy Puffer, MD as Consulting Physician (Radiation Oncology) Jethro Bolus, MD as Consulting Physician (Ophthalmology) Anselm Lis, RPH-CPP as Pharmacist (Hematology and Oncology) Pickenpack-Cousar, Arty Baumgartner, NP as Nurse Practitioner [facility] and Palliative Medicine)   I connected with Bennie Hind on 07/16/23 at 11:30 AM EST by phone and verified that I am speaking with the correct person using two identifiers.   I discussed the limitations, risks, security and privacy concerns of performing an evaluation and management service by telemedicine and the availability of in-person appointments. I also discussed with the patient that there may be a patient responsible charge related to this service. The patient expressed understanding and agreed to proceed.   Other persons participating in the visit and their role in the encounter: n/a   Patient's location: home  Provider's location: [facility]   Chief Complaint: f/u of symptom management   INTERVAL HISTORY: Kayla Banks is a 67 y.o. 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  Palliative ask to see for symptom management and goals of care.     SOCIAL HISTORY:     reports that she has never smoked. She has never used smokeless tobacco. She reports that she does not currently use alcohol. She reports that she does not use drugs.  ADVANCE DIRECTIVES:  None on file  CODE  STATUS: Full code  PAST MEDICAL HISTORY: Past Medical History:  Diagnosis Date   Breast cancer (HCC)    Complication of anesthesia    Family history of breast cancer    Heart murmur    no problems, saw cardiologist 2010   Hypertension    on meds   Hypothyroidism    Osteoarthritis    Personal history of chemotherapy    Personal history of radiation therapy    left   Plantar fasciitis    PONV (postoperative nausea and vomiting)    Thyroid disease    Hypothyroidism    ALLERGIES:  is allergic to gadolinium derivatives, erythromycin, lisinopril, penicillins, and adhesive [tape].  MEDICATIONS:  Current Outpatient Medications  Medication Sig Dispense Refill   abemaciclib (VERZENIO) 50 MG tablet Take 1 tablet (50 mg total) by mouth 2 (two) times daily. Swallow tablets whole. Do not chew, crush, or split tablets before swallowing. 56 tablet 5   amLODipine (NORVASC) 5 MG tablet Take 1 tablet (5 mg total) by mouth daily. Needs ov before any more refills 90 tablet 1   carvedilol (COREG) 25 MG tablet Take 1 tablet (25 mg total) by mouth 2 (two) times daily with a meal. Due for appt 03/2023 180 tablet 1   EPINEPHRINE 0.3 mg/0.3 mL IJ SOAJ injection INJECT 0.3 ML(1 SYRINGE) IN THE MUSCLE AS NEEDED FOR ALLERGIC REACTION 2 Device 0   gabapentin (NEURONTIN) 300 MG capsule Take 1 capsule (300 mg total) by mouth 2 (two) times daily. 60 capsule 1   HYDROcodone-acetaminophen (NORCO) 5-325 MG tablet Take 1-2 tablets by mouth every 4 (four) hours as needed. 60 tablet 0   letrozole (FEMARA) 2.5 MG tablet Take 1 tablet (2.5 mg  total) by mouth daily. 90 tablet 3   meloxicam (MOBIC) 15 MG tablet Take 1 tablet (15 mg total) by mouth daily as needed for pain. (Patient taking differently: Take 15 mg by mouth daily as needed for pain.) 90 tablet 1   ondansetron (ZOFRAN) 8 MG tablet 1/2 to 1 tablet q 12 hours prn nausea 20 tablet 3   prochlorperazine (COMPAZINE) 10 MG tablet Take 1 tablet (10 mg total) by mouth  every 6 (six) hours as needed for nausea or vomiting. 30 tablet 2   silver sulfADIAZINE (SILVADENE) 1 % cream Apply 1 Application topically 2 (two) times daily. 50 g 0   SYNTHROID 100 MCG tablet Take 1 tablet (100 mcg total) by mouth daily before breakfast. 90 tablet 1   valACYclovir (VALTREX) 1000 MG tablet TAKE 1 TABLET BY MOUTH THREE TIMES DAILY AS NEEDED (Patient taking differently: Take 1,000 mg by mouth as needed. Takes 1 tablet by mouth daily as needed for cold sores) 30 tablet 2   No current facility-administered medications for this visit.    VITAL SIGNS: LMP 01/28/2012  There were no vitals filed for this visit.  Estimated body mass index is 36.64 kg/m as calculated from the following:   Height as of 07/13/23: 5\' 6"  (1.676 m).   Weight as of 07/13/23: 227 lb (103 kg).   PERFORMANCE STATUS (ECOG) : 1 - Symptomatic but completely ambulatory   Physical Exam General: NAD Cardiovascular: regular rate and rhythm Pulmonary: clear ant fields Abdomen: soft, nontender, + bowel sounds Extremities: no edema, no joint deformities Skin: no rashes Neurological:   IMPRESSION: ***  We discussed Her current illness and what it means in the larger context of Her on-going co-morbidities. Natural disease trajectory and expectations were discussed.  I discussed the importance of continued conversation with family and their medical providers regarding overall plan of care and treatment options, ensuring decisions are within the context of the patients values and GOCs.  PLAN: Established therapeutic relationship. Education provided on palliative's role in collaboration with their Oncology/Radiation team.   Cancer-related symptoms Previously experienced nausea, vomiting, diarrhea, and significant weight loss. Symptoms have improved with adjustment of Verzenio dosage per Dr. Pamelia Hoit and Jonny Ruiz, PharmD close management. Currently experiencing sporadic sharp, stabbing pain under right shoulder  blade and lower back, and neuropathy in hands and feet. -Keep Hydrocodone on hand for moderate to severe pain episodes. -Restart Gabapentin 300mg  twice daily for neuropathy and additional pain relief. -Use over-the-counter Tylenol for mild pain episodes.   Mobility issues Difficulty with walking and lifting feet, possibly due to foot drop from previous back surgery and current cancer treatment. -Order home health physical therapy and occupational therapy for evaluation and potential strengthening exercises.   Follow-up plans -Check-in call in one week to assess response to restarting Gabapentin and to ensure physical therapy has reached out. -Continue to use MyChart for communication and medication refill requests. -Keep Hydrocodone and Gabapentin on hand, notify provider when running low.   Patient expressed understanding and was in agreement with this plan. She also understands that She can call the clinic at any time with any questions, concerns, or complaints.   Any controlled substances utilized were prescribed in the context of palliative care. PDMP has been reviewed.    Visit consisted of counseling and education dealing with the complex and emotionally intense issues of symptom management and palliative care in the setting of serious and potentially life-threatening illness.  Willette Alma, AGPCNP-BC  Palliative Medicine Team/[facility]  *  Please note that this is a verbal dictation therefore any spelling or grammatical errors are due to the "[employer] One" system interpretation.

## 2023-07-19 ENCOUNTER — Other Ambulatory Visit: Payer: Self-pay

## 2023-07-20 ENCOUNTER — Encounter: Payer: Self-pay | Admitting: Hematology and Oncology

## 2023-07-22 ENCOUNTER — Other Ambulatory Visit (HOSPITAL_COMMUNITY): Payer: Self-pay

## 2023-07-22 ENCOUNTER — Inpatient Hospital Stay: Payer: BC Managed Care – PPO | Admitting: Nurse Practitioner

## 2023-07-22 ENCOUNTER — Encounter (HOSPITAL_COMMUNITY): Payer: Self-pay

## 2023-07-22 ENCOUNTER — Other Ambulatory Visit: Payer: Self-pay

## 2023-07-23 ENCOUNTER — Encounter: Payer: Self-pay | Admitting: Hematology and Oncology

## 2023-07-23 NOTE — Progress Notes (Signed)
erroneous

## 2023-07-28 ENCOUNTER — Encounter: Payer: Self-pay | Admitting: Hematology and Oncology

## 2023-08-05 ENCOUNTER — Ambulatory Visit (HOSPITAL_COMMUNITY)
Admission: RE | Admit: 2023-08-05 | Discharge: 2023-08-05 | Disposition: A | Discharge status: Home / Self Care | Payer: BC Managed Care – PPO | Source: Ambulatory Visit | Attending: Hematology and Oncology | Admitting: Hematology and Oncology

## 2023-08-05 DIAGNOSIS — Z170 Estrogen receptor positive status [ER+]: Secondary | ICD-10-CM | POA: Insufficient documentation

## 2023-08-05 DIAGNOSIS — C50412 Malignant neoplasm of upper-outer quadrant of left female breast: Secondary | ICD-10-CM | POA: Insufficient documentation

## 2023-08-05 DIAGNOSIS — C7951 Secondary malignant neoplasm of bone: Secondary | ICD-10-CM | POA: Insufficient documentation

## 2023-08-05 MED ORDER — IOHEXOL 300 MG/ML  SOLN
100.0000 mL | Freq: Once | INTRAMUSCULAR | Status: AC | PRN
  Administered 2023-08-05: 100 mL via INTRAVENOUS

## 2023-08-09 ENCOUNTER — Inpatient Hospital Stay: Payer: BC Managed Care – PPO | Attending: Hematology and Oncology

## 2023-08-09 ENCOUNTER — Inpatient Hospital Stay: Payer: BC Managed Care – PPO

## 2023-08-09 ENCOUNTER — Inpatient Hospital Stay: Payer: BC Managed Care – PPO | Attending: Hematology and Oncology | Admitting: Hematology and Oncology

## 2023-08-09 DIAGNOSIS — Z95828 Presence of other vascular implants and grafts: Secondary | ICD-10-CM

## 2023-08-09 DIAGNOSIS — C7951 Secondary malignant neoplasm of bone: Secondary | ICD-10-CM | POA: Insufficient documentation

## 2023-08-09 DIAGNOSIS — Z79811 Long term (current) use of aromatase inhibitors: Secondary | ICD-10-CM | POA: Insufficient documentation

## 2023-08-09 DIAGNOSIS — C50412 Malignant neoplasm of upper-outer quadrant of left female breast: Secondary | ICD-10-CM | POA: Diagnosis not present

## 2023-08-09 DIAGNOSIS — Z170 Estrogen receptor positive status [ER+]: Secondary | ICD-10-CM

## 2023-08-09 DIAGNOSIS — Z923 Personal history of irradiation: Secondary | ICD-10-CM | POA: Insufficient documentation

## 2023-08-09 DIAGNOSIS — Z79899 Other long term (current) drug therapy: Secondary | ICD-10-CM | POA: Diagnosis not present

## 2023-08-09 LAB — CMP (CANCER CENTER ONLY)
ALT: 6 U/L (ref 0–44)
AST: 11 U/L — ABNORMAL LOW (ref 15–41)
Albumin: 3.8 g/dL (ref 3.5–5.0)
Alkaline Phosphatase: 144 U/L — ABNORMAL HIGH (ref 38–126)
Anion gap: 5 (ref 5–15)
BUN: 13 mg/dL (ref 8–23)
CO2: 27 mmol/L (ref 22–32)
Calcium: 9 mg/dL (ref 8.9–10.3)
Chloride: 109 mmol/L (ref 98–111)
Creatinine: 0.85 mg/dL (ref 0.44–1.00)
GFR, Estimated: >60 mL/min (ref >60)
Glucose, Bld: 96 mg/dL (ref 70–99)
Potassium: 4.5 mmol/L (ref 3.5–5.1)
Sodium: 141 mmol/L (ref 135–145)
Total Bilirubin: 0.7 mg/dL (ref <1.2)
Total Protein: 6.7 g/dL (ref 6.5–8.1)

## 2023-08-09 LAB — CBC WITH DIFFERENTIAL (CANCER CENTER ONLY)
Abs Immature Granulocytes: 0.02 10*3/uL (ref 0.00–0.07)
Basophils Absolute: 0 10*3/uL (ref 0.0–0.1)
Basophils Relative: 1 %
Eosinophils Absolute: 0.1 10*3/uL (ref 0.0–0.5)
Eosinophils Relative: 1 %
HCT: 33.3 % — ABNORMAL LOW (ref 36.0–46.0)
Hemoglobin: 11.2 g/dL — ABNORMAL LOW (ref 12.0–15.0)
Immature Granulocytes: 0 %
Lymphocytes Relative: 10 %
Lymphs Abs: 0.5 10*3/uL — ABNORMAL LOW (ref 0.7–4.0)
MCH: 32.6 pg (ref 26.0–34.0)
MCHC: 33.6 g/dL (ref 30.0–36.0)
MCV: 96.8 fL (ref 80.0–100.0)
Monocytes Absolute: 0.4 10*3/uL (ref 0.1–1.0)
Monocytes Relative: 8 %
Neutro Abs: 4.3 10*3/uL (ref 1.7–7.7)
Neutrophils Relative %: 80 %
Platelet Count: 179 10*3/uL (ref 150–400)
RBC: 3.44 MIL/uL — ABNORMAL LOW (ref 3.87–5.11)
RDW: 18.5 % — ABNORMAL HIGH (ref 11.5–15.5)
WBC Count: 5.4 10*3/uL (ref 4.0–10.5)
nRBC: 0 % (ref 0.0–0.2)

## 2023-08-09 MED ORDER — DENOSUMAB 120 MG/1.7ML ~~LOC~~ SOLN
120.0000 mg | Freq: Once | SUBCUTANEOUS | Status: AC
  Administered 2023-08-09: 120 mg via SUBCUTANEOUS
  Filled 2023-08-09: qty 1.7

## 2023-08-09 NOTE — Assessment & Plan Note (Signed)
Left lumpectomy: Grade 2 IDC with DCIS, 1 cm, margins negative, DCIS focally 0.1 cm from superior margin, 2/2 lymph nodes positive, ER 95%, PR 45%, HER-2 negative, Ki-67 20% AXLND: 3/20 LN Pos (total 5/22)   Treatment Plan: 1. Adj chemo with DD Adriamycin and Cytoxan followed by Taxol weekly X 11 (stopped for neuropathy) completed 04/19/2019 2. Adj XRT 06/01/2019-07/21/2019 3. Adj Anti estrogen therapy --------------------------------------------------------------------------------------------- Current treatment: Anastrozole 1 mg daily started 07/26/2019 switched to Letrozole and Verzenio   Bone density 05/05/2022: T score of 0.3: Normal    CT CAP 04/02/2023: Small bilateral pleural effusions, multiple small bilateral pulmonary nodules measuring up to 5 mm, right paratracheal and right hilar lymph nodes indeterminate.  Numerous lytic lesions in axial and appendical skeleton concerning for metastatic disease or myeloma.  Possible acute left seventh eighth ninth rib fractures hypodensities within the liver subcentimeter   05/13/2023: FNA lymph node: Metastatic breast cancer (positive for GATA 3)   Treatment plan: PET/CT scan 04/30/2023: Widespread bone metastases cervical thoracic lumbar spine sternum ribs shoulders and pelvis, hypermetabolic right paratracheal and right hilar lymphadenopathy, tiny pulmonary nodules: Patient has appointment to see Dr. Tonia Brooms for bronchoscopy and biopsy. Current treatment: Anastrozole with Verzenio started 05/04/2023 Bone metastasis: Xgeva along with calcium and vitamin D Patient seeing Dr. Mitzi Hansen for palliative radiation 05/27/2023-06/03/2023   Verzinio toxicities: (Now at 50 mg BiD) Nausea Diarrhea   Brain MRI 07/01/2023: No brain mets Bone mets: Xgeva in 1 month CT CAP 08/06/2023: Interval resolution of right paratracheal lymph node, stable bone metastasis, lung nodules stable

## 2023-08-09 NOTE — Progress Notes (Signed)
Patient Care Team: Zola Button, Grayling Congress, DO as PCP - General Jodi Geralds, MD as Consulting Physician (Orthopedic Surgery) Harriette Bouillon, MD as Consulting Physician (General Surgery) Serena Croissant, MD as Consulting Physician (Hematology and Oncology) Dorothy Puffer, MD as Consulting Physician (Radiation Oncology) Jethro Bolus, MD as Consulting Physician (Ophthalmology) Anselm Lis, RPH-CPP as Pharmacist (Hematology and Oncology) Pickenpack-Cousar, Arty Baumgartner, NP as Nurse Practitioner (Hospice and Palliative Medicine)  DIAGNOSIS:  Encounter Diagnosis  Name Primary?   Malignant neoplasm of upper-outer quadrant of left breast in female, estrogen receptor positive (HCC)     SUMMARY OF ONCOLOGIC HISTORY: Oncology History  Malignant neoplasm of upper-outer quadrant of left breast in female, estrogen receptor positive (HCC)  08/24/2018 Initial Biopsy   Screening detected left breast mass at 2:30 position 6 mm in size with one abnormal lymph node and stable benign calcifications, biopsy of the mass grade 2 IDC with DCIS and lymphovascular invasion, lymph node biopsy is positive, ER 95%, PR 40%, Ki-67 20%, HER-2 -1+ by IHC, T1b N1 stage Ib   08/31/2018 Cancer Staging   Staging form: Breast, AJCC 8th Edition - Clinical: Stage IB (cT1b, cN1(f), cM0, G2, ER+, PR+, HER2-) - Signed by Serena Croissant, MD on 08/31/2018   09/21/2018 Genetic Testing   Genetic testing performed through [facility]'s Common Hereditary Cancers Panel reported out on 09/19/2018 showed no pathogenic mutations.  The Common Hereditary Cancer Panel offered by [facility] includes sequencing and/or deletion duplication testing of the following 53 genes: APC, ATM, AXIN2, BARD1, BMPR1A, BRCA1, BRCA2, BRIP1, BUB1B, CDH1, CDK4, CDKN2A, CHEK2, CTNNA1, DICER1, ENG, EPCAM, GALNT12, GREM1, HOXB13, KIT, MEN1, MLH1, MLH3, MSH2, MSH3, MSH6, MUTYH, NBN, NF1, NTHL1, PALB2, PDGFRA, PMS2, POLD1, POLE, PTEN, RAD50, RAD51C, RAD51D, RNF43, RPS20, SDHA, SDHB,  SDHC, SDHD, SMAD4, SMARCA4, STK11, TP53, TSC1, TSC2, VHL.   A variant of uncertain significance (VUS) in a gene called NBN was also noted. c.1979G>C (p.Arg660Thr)   10/18/2018 Cancer Staging   Staging form: Breast, AJCC 8th Edition - Pathologic stage from 10/18/2018: Stage IB (pT1b, pN2a, cM0, G2, ER+, PR+, HER2-) - Signed by Serena Croissant, MD on 11/14/2018   11/09/2018 Surgery   Left lumpectomy ([facility]) [phone]): Grade 2 IDC with DCIS, 1 cm, margins negative, DCIS focally 0.1 cm from superior margin, 2/2 lymph nodes positive, ER 95%, PR 45%, HER-2 negative, Ki-67 20% AXLND: 3/20 LN Pos (total 5/22)   12/15/2018 -  Chemotherapy   DOXOrubicin (ADRIAMYCIN) chemo injection 142 mg, 60 mg/m2 = 142 mg, Intravenous,  Once, 4 of 4 cycles. Administration: 142 mg (12/15/2018), 142 mg (12/29/2018), 142 mg (01/12/2019), 142 mg (01/26/2019)  palonosetron (ALOXI) injection 0.25 mg, 0.25 mg, Intravenous,  Once, 4 of 4 cycles. Administration: 0.25 mg (12/15/2018), 0.25 mg (12/29/2018), 0.25 mg (01/12/2019), 0.25 mg (01/26/2019)  pegfilgrastim-cbqv (UDENYCA) injection 6 mg, 6 mg, Subcutaneous, Once, 4 of 4 cycles. Administration: 6 mg (12/17/2018), 6 mg (12/31/2018), 6 mg (01/14/2019), 6 mg (01/28/2019)  cyclophosphamide (CYTOXAN) 1,420 mg in sodium chloride 0.9 % 250 mL chemo infusion, 600 mg/m2 = 1,420 mg, Intravenous,  Once, 4 of 4 cycles. Administration: 1,420 mg (12/15/2018), 1,420 mg (12/29/2018), 1,420 mg (01/12/2019), 1,420 mg (01/26/2019)  PACLitaxel (TAXOL) 192 mg in sodium chloride 0.9 % 250 mL chemo infusion (</= 80mg /m2), 80 mg/m2 = 192 mg, Intravenous,  Once, 11 of 12 cycles. Dose modification: 60 mg/m2 (original dose 80 mg/m2, Cycle 12, Reason: Dose not tolerated). Administration: 192 mg (02/09/2019), 192 mg (02/16/2019), 192 mg (02/23/2019), 192 mg (03/02/2019), 192 mg (03/09/2019), 192 mg (  03/16/2019), 192 mg (03/23/2019), 144 mg (03/30/2019), 144 mg (04/06/2019), 144 mg (04/14/2019), 144 mg (04/20/2019)  fosaprepitant (EMEND)  150 mg  dexamethasone (DECADRON) 12 mg in sodium chloride 0.9 % 145 mL IVPB, , Intravenous,  Once, 4 of 4 cycles Administration:  (12/15/2018),  (12/29/2018),  (01/12/2019),  (01/26/2019)     06/01/2019 - 07/21/2019 Radiation Therapy   The patient initially received a dose of 50.4 Gy in 28 fractions to the breast using whole-breast tangent fields. This was delivered using a 3-D conformal technique. The pt received a boost delivering an additional 10 Gy in 5 fractions using a electron boost with electrons. The total dose was 60.4 Gy.    08/2019 - 04/2023 Anti-estrogen oral therapy   Anastrozole   04/02/2023 Relapse/Recurrence   CT CAP 04/02/2023: Small bilateral pleural effusions, multiple small bilateral pulmonary nodules measuring up to 5 mm, right paratracheal and right hilar lymph nodes indeterminate.  Numerous lytic lesions in axial and appendical skeleton concerning for metastatic disease or myeloma.  Possible acute left seventh eighth ninth rib fractures hypodensities within the liver subcentimeter     04/30/2023 PET scan   PET/CT scan 04/30/2023: Widespread bone metastases cervical thoracic lumbar spine sternum ribs shoulders and pelvis, hypermetabolic right paratracheal and right hilar lymphadenopathy, tiny pulmonary nodules: Patient has appointment to see Dr. Tonia Brooms for bronchoscopy and biopsy.   05/07/2023 Treatment Plan Change   Letrozole daily, Caffie Damme   05/10/2023 - 06/03/2023 Radiation Therapy   Plan Name: Chest_R_Ribs Site: Ribs, Right Technique: Isodose Plan Mode: Photon Dose Per Fraction: 3 Gy Prescribed Dose (Delivered / Prescribed): 30 Gy / 30 Gy Prescribed Fxs (Delivered / Prescribed): 10 / 10   Plan Name: Chest_L_Ribs Site: Ribs, Left Technique: Isodose Plan Mode: Photon Dose Per Fraction: 3 Gy Prescribed Dose (Delivered / Prescribed): 30 Gy / 30 Gy Prescribed Fxs (Delivered / Prescribed): 10 / 10   Plan Name: Pelvis Site: [place], Right Technique:  Isodose Plan Mode: Photon Dose Per Fraction: 3 Gy Prescribed Dose (Delivered / Prescribed): 21 Gy / 21 Gy Prescribed Fxs (Delivered / Prescribed): 7 / 7   Plan Name: Pelvis:1 Site: [place], Right Technique: Isodose Plan Mode: Photon Dose Per Fraction: 3 Gy Prescribed Dose (Delivered / Prescribed): 9 Gy / 9 Gy Prescribed Fxs (Delivered / Prescribed): 3 / 3   Plan Name: Pelvis_Bst Site: Pelvis Technique: Isodose Plan Mode: Photon Dose Per Fraction: 4 Gy Prescribed Dose (Delivered / Prescribed): 12 Gy / 12 Gy Prescribed Fxs (Delivered / Prescribed): 3 / 3       CHIEF COMPLIANT: Follow-up to discuss results of scans for metastatic breast cancer  HISTORY OF PRESENT ILLNESS:  History of Present Illness   The patient, with a history of cancer with 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 She reports significant improvement in her condition since the dosage of her medication was reduced. She notes an improved appetite and is now able to keep food down. She also mentions a kidney stone and a focus of density in the aorta, which is thought to be benign. The patient is scheduled for an injection today and has a phone visit scheduled with another doctor.     ALLERGIES:  is allergic to gadolinium derivatives, erythromycin, lisinopril, penicillins, and adhesive [tape].  MEDICATIONS:  Current Outpatient Medications  Medication Sig Dispense Refill   abemaciclib (VERZENIO) 50 MG tablet Take 1 tablet (50 mg total) by mouth 2 (two) times daily. Swallow tablets  whole. Do not chew, crush, or split tablets before swallowing. 56 tablet 5   amLODipine (NORVASC) 5 MG tablet Take 1 tablet (5 mg total) by mouth daily. Needs ov before any more refills 90 tablet 1   carvedilol (COREG) 25 MG tablet Take 1 tablet (25 mg total) by mouth 2 (two) times daily with a meal. Due for appt 03/2023 180 tablet 1   EPINEPHRINE 0.3 mg/0.3 mL IJ SOAJ injection INJECT 0.3  ML(1 SYRINGE) IN THE MUSCLE AS NEEDED FOR ALLERGIC REACTION 2 Device 0   gabapentin (NEURONTIN) 300 MG capsule Take 1 capsule (300 mg total) by mouth 2 (two) times daily. 60 capsule 1   HYDROcodone-acetaminophen (NORCO) 5-325 MG tablet Take 1-2 tablets by mouth every 4 (four) hours as needed. 60 tablet 0   letrozole (FEMARA) 2.5 MG tablet Take 1 tablet (2.5 mg total) by mouth daily. 90 tablet 3   meloxicam (MOBIC) 15 MG tablet Take 1 tablet (15 mg total) by mouth daily as needed for pain. (Patient taking differently: Take 15 mg by mouth daily as needed for pain.) 90 tablet 1   ondansetron (ZOFRAN) 8 MG tablet 1/2 to 1 tablet q 12 hours prn nausea 20 tablet 3   prochlorperazine (COMPAZINE) 10 MG tablet Take 1 tablet (10 mg total) by mouth every 6 (six) hours as needed for nausea or vomiting. 30 tablet 2   silver sulfADIAZINE (SILVADENE) 1 % cream Apply 1 Application topically 2 (two) times daily. 50 g 0   SYNTHROID 100 MCG tablet Take 1 tablet (100 mcg total) by mouth daily before breakfast. 90 tablet 1   valACYclovir (VALTREX) 1000 MG tablet TAKE 1 TABLET BY MOUTH THREE TIMES DAILY AS NEEDED (Patient taking differently: Take 1,000 mg by mouth as needed. Takes 1 tablet by mouth daily as needed for cold sores) 30 tablet 2   No current facility-administered medications for this visit.    PHYSICAL EXAMINATION: ECOG PERFORMANCE STATUS: 1 - Symptomatic but completely ambulatory  Vitals:   08/09/23 1339  BP: 126/62  Pulse: 80  Resp: 18  Temp: 97.8 F (36.6 C)  SpO2: 100%   Filed Weights   08/09/23 1339  Weight: 229 lb 11.2 oz (104.2 kg)      LABORATORY DATA:  I have reviewed the data as listed    Latest Ref Rng & Units 08/09/2023    1:25 PM 07/13/2023    1:20 PM 06/16/2023    2:59 PM  CMP  Glucose 70 - 99 mg/dL 96  454  098   BUN 8 - 23 mg/dL 13  7  10    Creatinine 0.44 - 1.00 mg/dL 1.19  1.47  8.29   Sodium 135 - 145 mmol/L 141  142  141   Potassium 3.5 - 5.1 mmol/L 4.5  4.7   3.4   Chloride 98 - 111 mmol/L 109  109  105   CO2 22 - 32 mmol/L 27  29  27    Calcium 8.9 - 10.3 mg/dL 9.0  8.6  8.2   Total Protein 6.5 - 8.1 g/dL 6.7  6.7  6.1   Total Bilirubin <1.2 mg/dL 0.7  1.2  0.9   Alkaline Phos 38 - 126 U/L 144  250  208   AST 15 - 41 U/L 11  18  17    ALT 0 - 44 U/L 6  11  18      Lab Results  Component Value Date   WBC 5.4 08/09/2023   HGB 11.2 (L)  08/09/2023   HCT 33.3 (L) 08/09/2023   MCV 96.8 08/09/2023   PLT 179 08/09/2023   NEUTROABS 4.3 08/09/2023    ASSESSMENT & PLAN:  Malignant neoplasm of upper-outer quadrant of left breast in female, estrogen receptor positive (HCC) Left lumpectomy: Grade 2 IDC with DCIS, 1 cm, margins negative, DCIS focally 0.1 cm from superior margin, 2/2 lymph nodes positive, ER 95%, PR 45%, HER-2 negative, Ki-67 20% AXLND: 3/20 LN Pos (total 5/22)   Treatment Plan: 1. Adj chemo with DD Adriamycin and Cytoxan followed by Taxol weekly X 11 (stopped for neuropathy) completed 04/19/2019 2. Adj XRT 06/01/2019-07/21/2019 3. Adj Anti estrogen therapy --------------------------------------------------------------------------------------------- Current treatment: Anastrozole 1 mg daily started 07/26/2019 switched to Letrozole and Verzenio   Bone density 05/05/2022: T score of 0.3: Normal    CT CAP 04/02/2023: Small bilateral pleural effusions, multiple small bilateral pulmonary nodules measuring up to 5 mm, right paratracheal and right hilar lymph nodes indeterminate.  Numerous lytic lesions in axial and appendical skeleton concerning for metastatic disease or myeloma.  Possible acute left seventh eighth ninth rib fractures hypodensities within the liver subcentimeter   05/13/2023: FNA lymph node: Metastatic breast cancer (positive for GATA 3)   Treatment plan: PET/CT scan 04/30/2023: Widespread bone metastases cervical thoracic lumbar spine sternum ribs shoulders and pelvis, hypermetabolic right paratracheal and right hilar  lymphadenopathy, tiny pulmonary nodules: Patient has appointment to see Dr. Tonia Brooms for bronchoscopy and biopsy. Current treatment: Anastrozole with Verzenio started 05/04/2023 Bone metastasis: Xgeva along with calcium and vitamin D Patient seeing Dr. Mitzi Hansen for palliative radiation 05/27/2023-06/03/2023   Verzinio toxicities: (Now at 50 mg BiD) Nausea Diarrhea   Brain MRI 07/01/2023: No brain mets Bone mets: Xgeva in 1 month CT CAP 08/06/2023: Interval resolution of right paratracheal lymph node, stable bone metastasis, lung nodules stable ------------------------------------- Assessment and Plan    Metastatic Cancer Significant improvement in lymph node and bone metastases on recent scans. Stable tiny lung nodules. Patient tolerating treatment well since dose reduction, with improved appetite and ability to keep food down. -Continue current treatment regimen. -Next scan in three months.  General Health Maintenance -Normal white count, hemoglobin 11.2, and normal platelets. -Continue potassium supplementation as discussed. -Phone visit with Dr. Pamelia Hoit scheduled for Friday at 8 AM. -Follow-up appointment with Jonny Ruiz in six weeks. -Follow-up appointment with current provider in three months.      No orders of the defined types were placed in this encounter.  The patient has a good understanding of the overall plan. she agrees with it. she will call with any problems that may develop before the next visit here. Total time spent: 30 mins including face to face time and time spent for planning, charting and co-ordination of care   Tamsen Meek, MD 08/09/23

## 2023-08-10 ENCOUNTER — Ambulatory Visit
Admission: RE | Admit: 2023-08-10 | Discharge: 2023-08-10 | Disposition: A | Discharge status: Home / Self Care | Payer: BC Managed Care – PPO | Source: Ambulatory Visit | Attending: Hematology and Oncology | Admitting: Hematology and Oncology

## 2023-08-10 NOTE — Progress Notes (Signed)
  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 Voicemail left.  The patient is scheduled for ongoing care with Dr. Pamelia Hoit in medical oncology. The patient was encouraged to call if she develops concerns or questions regarding radiation.    Ruel Favors, LPN

## 2023-08-12 ENCOUNTER — Other Ambulatory Visit: Payer: Self-pay

## 2023-08-12 ENCOUNTER — Telehealth: Payer: Self-pay | Admitting: Hematology and Oncology

## 2023-08-12 NOTE — Telephone Encounter (Signed)
Unable to leave patient a voicemail regarding scheduled appointments due to voicemail box being full

## 2023-08-16 ENCOUNTER — Encounter: Payer: Self-pay | Admitting: Hematology and Oncology

## 2023-08-16 ENCOUNTER — Other Ambulatory Visit: Payer: Self-pay

## 2023-08-19 ENCOUNTER — Other Ambulatory Visit (HOSPITAL_COMMUNITY): Payer: Self-pay

## 2023-08-26 ENCOUNTER — Other Ambulatory Visit (HOSPITAL_COMMUNITY): Payer: Self-pay

## 2023-08-26 ENCOUNTER — Other Ambulatory Visit: Payer: Self-pay

## 2023-08-26 ENCOUNTER — Telehealth: Payer: Self-pay | Admitting: Pharmacy Technician

## 2023-08-26 ENCOUNTER — Encounter: Payer: Self-pay | Admitting: Hematology and Oncology

## 2023-08-26 NOTE — Progress Notes (Signed)
 Specialty Pharmacy Ongoing Clinical Assessment Note  Kayla Banks is a 68 y.o. female who is being followed by the specialty pharmacy service for RxSp Oncology   Patient's specialty medication(s) reviewed today: Abemaciclib  (VERZENIO )   Missed doses in the last 4 weeks: 0   Patient/Caregiver did not have any additional questions or concerns.   Therapeutic benefit summary: Unable to assess   Adverse events/side effects summary: No adverse events/side effects   Patient's therapy is appropriate to: Continue    Goals Addressed             This Visit's Progress    Stabilization of disease       Patient is on track. Patient will maintain adherence         Follow up:  3 months  [name] [facility] Specialty Pharmacist

## 2023-08-26 NOTE — Progress Notes (Signed)
 Specialty Pharmacy Refill Coordination Note  Kayla Banks is a 68 y.o. female contacted today regarding refills of specialty medication(s) Abemaciclib  (VERZENIO )   Patient requested Delivery   Delivery date: 08/30/23   Verified address: [address] [place] [address]   Medication will be filled on 01.10.25.

## 2023-08-26 NOTE — Telephone Encounter (Signed)
 Oral Oncology Patient Advocate Encounter   Was successful in obtaining a copay card for [facility]  This copay card will make the patients copay $0.  The billing information is as follows and has been shared with [facility].   RxBin: [id number] PCN: [id number] Member ID: [id number] Group ID: [id number]   Estefana Moellers, CPhT-Adv Oncology Pharmacy Patient Advocate [facility] Direct Number: [phone]  Fax: [phone]

## 2023-09-20 ENCOUNTER — Encounter: Payer: Self-pay | Admitting: Pharmacist

## 2023-09-20 ENCOUNTER — Inpatient Hospital Stay: Payer: 59 | Admitting: Pharmacist

## 2023-09-20 ENCOUNTER — Encounter: Payer: Self-pay | Admitting: Hematology and Oncology

## 2023-09-20 ENCOUNTER — Inpatient Hospital Stay: Payer: 59

## 2023-09-22 ENCOUNTER — Other Ambulatory Visit: Payer: Self-pay

## 2023-09-24 ENCOUNTER — Other Ambulatory Visit (HOSPITAL_COMMUNITY): Payer: Self-pay

## 2023-09-27 ENCOUNTER — Other Ambulatory Visit: Payer: Self-pay

## 2023-09-28 ENCOUNTER — Inpatient Hospital Stay: Payer: 59 | Attending: Hematology and Oncology

## 2023-09-28 ENCOUNTER — Encounter: Payer: Self-pay | Admitting: Pharmacist

## 2023-09-28 ENCOUNTER — Inpatient Hospital Stay: Payer: 59 | Admitting: Pharmacist

## 2023-09-28 ENCOUNTER — Telehealth: Payer: Self-pay | Admitting: Hematology and Oncology

## 2023-09-28 NOTE — Telephone Encounter (Signed)
Rescheduled appointments per 2/11 secure chat. Patient is aware of the changes made to her upcoming appointments.

## 2023-10-08 ENCOUNTER — Other Ambulatory Visit (HOSPITAL_COMMUNITY): Payer: Self-pay

## 2023-10-08 ENCOUNTER — Encounter: Payer: Self-pay | Admitting: Hematology and Oncology

## 2023-10-12 ENCOUNTER — Inpatient Hospital Stay: Payer: 59 | Admitting: Pharmacist

## 2023-10-12 ENCOUNTER — Inpatient Hospital Stay: Payer: 59 | Attending: Hematology and Oncology

## 2023-10-12 ENCOUNTER — Other Ambulatory Visit: Payer: Self-pay | Admitting: Family Medicine

## 2023-10-12 VITALS — BP 135/59 | HR 77 | Temp 97.7°F | Resp 16 | Wt 227.4 lb

## 2023-10-12 DIAGNOSIS — Z79811 Long term (current) use of aromatase inhibitors: Secondary | ICD-10-CM | POA: Diagnosis not present

## 2023-10-12 DIAGNOSIS — Z1732 Human epidermal growth factor receptor 2 negative status: Secondary | ICD-10-CM | POA: Insufficient documentation

## 2023-10-12 DIAGNOSIS — Z1721 Progesterone receptor positive status: Secondary | ICD-10-CM | POA: Diagnosis not present

## 2023-10-12 DIAGNOSIS — Z923 Personal history of irradiation: Secondary | ICD-10-CM | POA: Diagnosis not present

## 2023-10-12 DIAGNOSIS — M170 Bilateral primary osteoarthritis of knee: Secondary | ICD-10-CM

## 2023-10-12 DIAGNOSIS — C50412 Malignant neoplasm of upper-outer quadrant of left female breast: Secondary | ICD-10-CM | POA: Insufficient documentation

## 2023-10-12 DIAGNOSIS — C7951 Secondary malignant neoplasm of bone: Secondary | ICD-10-CM | POA: Diagnosis present

## 2023-10-12 DIAGNOSIS — Z170 Estrogen receptor positive status [ER+]: Secondary | ICD-10-CM

## 2023-10-12 LAB — CMP (CANCER CENTER ONLY)
ALT: 8 U/L (ref 0–44)
AST: 13 U/L — ABNORMAL LOW (ref 15–41)
Albumin: 3.8 g/dL (ref 3.5–5.0)
Alkaline Phosphatase: 76 U/L (ref 38–126)
Anion gap: 5 (ref 5–15)
BUN: 14 mg/dL (ref 8–23)
CO2: 27 mmol/L (ref 22–32)
Calcium: 8.2 mg/dL — ABNORMAL LOW (ref 8.9–10.3)
Chloride: 110 mmol/L (ref 98–111)
Creatinine: 0.78 mg/dL (ref 0.44–1.00)
GFR, Estimated: >60 mL/min (ref >60)
Glucose, Bld: 101 mg/dL — ABNORMAL HIGH (ref 70–99)
Potassium: 4.6 mmol/L (ref 3.5–5.1)
Sodium: 142 mmol/L (ref 135–145)
Total Bilirubin: 0.7 mg/dL (ref 0.0–1.2)
Total Protein: 6.8 g/dL (ref 6.5–8.1)

## 2023-10-12 LAB — CBC WITH DIFFERENTIAL (CANCER CENTER ONLY)
Abs Immature Granulocytes: 0.02 10*3/uL (ref 0.00–0.07)
Basophils Absolute: 0 10*3/uL (ref 0.0–0.1)
Basophils Relative: 1 %
Eosinophils Absolute: 0.1 10*3/uL (ref 0.0–0.5)
Eosinophils Relative: 3 %
HCT: 35.7 % — ABNORMAL LOW (ref 36.0–46.0)
Hemoglobin: 11.3 g/dL — ABNORMAL LOW (ref 12.0–15.0)
Immature Granulocytes: 0 %
Lymphocytes Relative: 11 %
Lymphs Abs: 0.6 10*3/uL — ABNORMAL LOW (ref 0.7–4.0)
MCH: 30.8 pg (ref 26.0–34.0)
MCHC: 31.7 g/dL (ref 30.0–36.0)
MCV: 97.3 fL (ref 80.0–100.0)
Monocytes Absolute: 0.4 10*3/uL (ref 0.1–1.0)
Monocytes Relative: 7 %
Neutro Abs: 4.5 10*3/uL (ref 1.7–7.7)
Neutrophils Relative %: 78 %
Platelet Count: 217 10*3/uL (ref 150–400)
RBC: 3.67 MIL/uL — ABNORMAL LOW (ref 3.87–5.11)
RDW: 15.1 % (ref 11.5–15.5)
WBC Count: 5.7 10*3/uL (ref 4.0–10.5)
nRBC: 0 % (ref 0.0–0.2)

## 2023-10-12 NOTE — Progress Notes (Signed)
 [facility]       Telephone: [phone]?Fax: [phone]   Oncology Clinical Pharmacist Practitioner Progress Note  Kayla Banks was contacted via in-person to discuss her chemotherapy regimen for abemaciclib which they receive under the care of Dr. Serena Croissant. Today she presents to clinic alone.    Current treatment regimen and start date Abemaciclib (05/10/23) 50 mg BID (06/22/23) 100 mg BID (05/10/23-06/10/23) Letrozole (05/04/23) Denosumab 120 mg (05/17/23)   Interval History She continues on abemaciclib 50 mg by mouth BID. Abemaciclib is given in combination with letrozole and denosumab. Therapy is planned to continue until disease progression or unacceptable toxicity. She last saw Dr. Pamelia Hoit on 08/09/23. CT scan showed stable disease on 08/06/23 and he reviewed these findings and continued therapy. She last saw clinical pharmacy on 07/13/23.  Response to Therapy She is doing much better on the lower dose of abemaciclib. Her Alk Phos has now normalized. Her calcium is low again today so we have recommended she start a calcium/vitamin D supplement. We have her the NCCN recommendations and she will pick this up over the counter. We also gave her a handout on foods she can consider. She will see Dr. Pamelia Hoit again on 11/01/23 with labs when she is next due for denosumab 120 mg. Labs, vitals, treatment parameters, and manufacturer guidelines assessing toxicity were reviewed with Bennie Hind today. Based on these values, patient is in agreement to continue abemaciclib therapy at this time.  Allergies Allergies  Allergen Reactions   Gadolinium Derivatives Anaphylaxis    MRI gadolinium contrast anaphylaxis with hives, facial swelling, wheezing, SOB, hypoxia   Erythromycin Swelling    Lip swelling; angioedema   Lisinopril Swelling    REACTION: angioedema   Penicillins Hives and Swelling    Swelling in arms & hands   Adhesive [Tape] Rash    Vitals    10/12/2023    12:45 PM 08/09/2023    1:39 PM 07/13/2023    3:01 PM  Oncology Vitals  Height  168 cm 168 cm  Weight 103.148 kg 104.191 kg 102.967 kg  Weight (lbs) 227 lbs 6 oz 229 lbs 11 oz 227 lbs  BMI 36.7 kg/m2 37.07 kg/m2 36.64 kg/m2  Temp 97.7 F (36.5 C) 97.8 F (36.6 C) 97.8 F (36.6 C)  Pulse Rate 77 80 103  BP 135/59 126/62 150/80  Resp 16 18 18   SpO2 100 % 100 % 100 %  BSA (m2) 2.19 m2 2.2 m2 2.19 m2    Laboratory Data    Latest Ref Rng & Units 10/12/2023   12:29 PM 08/09/2023    1:25 PM 07/13/2023    1:20 PM  CBC EXTENDED  WBC 4.0 - 10.5 K/uL 5.7  5.4  5.8   RBC 3.87 - 5.11 MIL/uL 3.67  3.44  3.62   Hemoglobin 12.0 - 15.0 g/dL 65.7  84.6  96.2   HCT 36.0 - 46.0 % 35.7  33.3  34.3   Platelets 150 - 400 K/uL 217  179  202   NEUT# 1.7 - 7.7 K/uL 4.5  4.3  4.5   Lymph# 0.7 - 4.0 K/uL 0.6  0.5  0.7        Latest Ref Rng & Units 10/12/2023   12:29 PM 08/09/2023    1:25 PM 07/13/2023    1:20 PM  CMP  Glucose 70 - 99 mg/dL 952  96  841   BUN 8 - 23 mg/dL 14  13  7    Creatinine 0.44 -  1.00 mg/dL 1.61  0.96  0.45   Sodium 135 - 145 mmol/L 142  141  142   Potassium 3.5 - 5.1 mmol/L 4.6  4.5  4.7   Chloride 98 - 111 mmol/L 110  109  109   CO2 22 - 32 mmol/L 27  27  29    Calcium 8.9 - 10.3 mg/dL 8.2  9.0  8.6   Total Protein 6.5 - 8.1 g/dL 6.8  6.7  6.7   Total Bilirubin 0.0 - 1.2 mg/dL 0.7  0.7  1.2   Alkaline Phos 38 - 126 U/L 76  144  250   AST 15 - 41 U/L 13  11  18    ALT 0 - 44 U/L 8  6  11      Adverse Effects Assessment Low calcium: adding vitamin D / calcium supplement. She will pick up OTC  Adherence Assessment Bellanie Sulkowski reports missing 2 doses over the past 8 weeks.   Reason for missed dose: forgot Patient was re-educated on importance of adherence.   Access Assessment Halen Mossbarger is currently receiving her abemaciclib through [facility] concerns:  none  Medication Reconciliation The patient's medication list was  reviewed today with the patient? Yes New medications or herbal supplements have recently been started? No  Any medications have been discontinued? No  The medication list was updated and reconciled based on the patient's most recent medication list in the electronic medical record (EMR) including herbal products and OTC medications.   Medications Current Outpatient Medications  Medication Sig Dispense Refill   abemaciclib (VERZENIO) 50 MG tablet Take 1 tablet (50 mg total) by mouth 2 (two) times daily. Swallow tablets whole. Do not chew, crush, or split tablets before swallowing. 56 tablet 5   amLODipine (NORVASC) 5 MG tablet Take 1 tablet (5 mg total) by mouth daily. Needs ov before any more refills 90 tablet 1   carvedilol (COREG) 25 MG tablet Take 1 tablet (25 mg total) by mouth 2 (two) times daily with a meal. Due for appt 03/2023 180 tablet 1   gabapentin (NEURONTIN) 300 MG capsule Take 1 capsule (300 mg total) by mouth 2 (two) times daily. 60 capsule 1   letrozole (FEMARA) 2.5 MG tablet Take 1 tablet (2.5 mg total) by mouth daily. 90 tablet 3   meloxicam (MOBIC) 15 MG tablet TAKE 1 TABLET(15 MG) BY MOUTH DAILY AS NEEDED FOR PAIN 90 tablet 1   SYNTHROID 100 MCG tablet Take 1 tablet (100 mcg total) by mouth daily before breakfast. 90 tablet 1   EPINEPHRINE 0.3 mg/0.3 mL IJ SOAJ injection INJECT 0.3 ML(1 SYRINGE) IN THE MUSCLE AS NEEDED FOR ALLERGIC REACTION (Patient not taking: Reported on 10/12/2023) 2 Device 0   HYDROcodone-acetaminophen (NORCO) 5-325 MG tablet Take 1-2 tablets by mouth every 4 (four) hours as needed. (Patient not taking: Reported on 10/12/2023) 60 tablet 0   ondansetron (ZOFRAN) 8 MG tablet 1/2 to 1 tablet q 12 hours prn nausea (Patient not taking: Reported on 10/12/2023) 20 tablet 3   prochlorperazine (COMPAZINE) 10 MG tablet Take 1 tablet (10 mg total) by mouth every 6 (six) hours as needed for nausea or vomiting. (Patient not taking: Reported on 10/12/2023) 30 tablet 2    valACYclovir (VALTREX) 1000 MG tablet TAKE 1 TABLET BY MOUTH THREE TIMES DAILY AS NEEDED (Patient not taking: Reported on 10/12/2023) 30 tablet 2   No current facility-administered medications for this visit.    Drug-Drug Interactions (DDIs) DDIs were  evaluated? Yes Significant DDIs? No  The patient was instructed to speak with their health care provider and/or the oral chemotherapy pharmacist before starting any new drug, including prescription or over the counter, natural / herbal products, or vitamins.  Supportive Care Diarrhea: we reviewed that diarrhea is common with abemaciclib and confirmed that she does have loperamide (Imodium) at home.  We reviewed how to take this medication PRN. Neutropenia: we discussed the importance of having a thermometer and what the Centers for Disease Control and Prevention (CDC) considers a fever which is 100.50F (38C) or higher.  Gave patient 24/7 triage line to call if any fevers or symptoms. ILD/Pneumonitis: we reviewed potential symptoms including cough, shortness, and fatigue.  VTE: reviewed signs of DVT such as leg swelling, redness, pain, or tenderness and signs of PE such as shortness of breath, rapid or irregular heartbeat, cough, chest pain, or lightheadedness. Reviewed to take the medication every 12 hours (with food sometimes can be easier on the stomach) and to take it at the same time every day. Hepatotoxicity Alk Phos now normal Drug interactions with grapefruit products  Dosing Assessment Hepatic adjustments needed? No  Renal adjustments needed? No  Toxicity adjustments needed? No  The current dosing regimen is appropriate to continue at this time.  Follow-Up Plan Continue abemaciclib 50 mg by mouth every 12 hours.  Continue letrozole 2.5 mg by mouth daily Continue denosumab 120 mg SubQ every 12 weeks. Next due 11/01/23 Start calcium/vitamin D supplement. NCCN recs provided and foods to consider. Monitor calcium. Other labs  stable Will move appointment for Dr. Pamelia Hoit visit from 10/26/23 to 11/01/23 to line up with her next denosumab injection Will add lab and denosumab 120 mg [name]) appointments for 11/01/23 She continues to follow with palliative care and states they have recommended she start PT when ready She is due to see her PCP (every 6 months) Dr. Seabron Spates. Last seen in August 2024 Lucyle Alumbaugh can follow up with clinical pharmacy as deemed necessary by Dr. Serena Croissant going forward   Bennie Hind participated in the discussion, expressed understanding, and voiced agreement with the above plan. All questions were answered to her satisfaction. The patient was advised to contact the clinic at [phone] with any questions or concerns prior to her return visit.   I spent 30 minutes assessing and educating the patient.  Nabil Bubolz A. Odetta Pink, PharmD, BCOP, CPP  Anselm Lis, RPH-CPP, 10/12/2023  1:10 PM   **Disclaimer: This note was dictated with voice recognition software. Similar sounding words can inadvertently be transcribed and this note may contain transcription errors which may not have been corrected upon publication of note.**

## 2023-10-26 ENCOUNTER — Inpatient Hospital Stay: Payer: 59 | Admitting: Hematology and Oncology

## 2023-10-26 ENCOUNTER — Other Ambulatory Visit: Payer: Self-pay | Admitting: *Deleted

## 2023-10-26 DIAGNOSIS — C50412 Malignant neoplasm of upper-outer quadrant of left female breast: Secondary | ICD-10-CM

## 2023-10-28 ENCOUNTER — Ambulatory Visit (HOSPITAL_COMMUNITY)
Admission: RE | Admit: 2023-10-28 | Discharge: 2023-10-28 | Disposition: A | Discharge status: Home / Self Care | Source: Ambulatory Visit | Attending: Hematology and Oncology | Admitting: Hematology and Oncology

## 2023-10-28 DIAGNOSIS — C50412 Malignant neoplasm of upper-outer quadrant of left female breast: Secondary | ICD-10-CM | POA: Diagnosis present

## 2023-10-28 DIAGNOSIS — Z170 Estrogen receptor positive status [ER+]: Secondary | ICD-10-CM | POA: Diagnosis present

## 2023-10-28 MED ORDER — IOHEXOL 300 MG/ML  SOLN
100.0000 mL | Freq: Once | INTRAMUSCULAR | Status: AC | PRN
  Administered 2023-10-28: 100 mL via INTRAVENOUS

## 2023-11-02 ENCOUNTER — Inpatient Hospital Stay: Payer: 59

## 2023-11-02 ENCOUNTER — Inpatient Hospital Stay: Payer: 59 | Attending: Hematology and Oncology | Admitting: Hematology and Oncology

## 2023-11-02 VITALS — BP 124/63 | HR 63 | Temp 97.3°F | Resp 17 | Ht 66.0 in | Wt 227.1 lb

## 2023-11-02 DIAGNOSIS — Z170 Estrogen receptor positive status [ER+]: Secondary | ICD-10-CM

## 2023-11-02 DIAGNOSIS — Z79811 Long term (current) use of aromatase inhibitors: Secondary | ICD-10-CM | POA: Insufficient documentation

## 2023-11-02 DIAGNOSIS — Z79899 Other long term (current) drug therapy: Secondary | ICD-10-CM | POA: Diagnosis not present

## 2023-11-02 DIAGNOSIS — Z1732 Human epidermal growth factor receptor 2 negative status: Secondary | ICD-10-CM | POA: Insufficient documentation

## 2023-11-02 DIAGNOSIS — C50412 Malignant neoplasm of upper-outer quadrant of left female breast: Secondary | ICD-10-CM | POA: Diagnosis present

## 2023-11-02 DIAGNOSIS — Z1721 Progesterone receptor positive status: Secondary | ICD-10-CM | POA: Diagnosis not present

## 2023-11-02 DIAGNOSIS — C7951 Secondary malignant neoplasm of bone: Secondary | ICD-10-CM | POA: Insufficient documentation

## 2023-11-02 DIAGNOSIS — Z95828 Presence of other vascular implants and grafts: Secondary | ICD-10-CM

## 2023-11-02 LAB — CBC WITH DIFFERENTIAL (CANCER CENTER ONLY)
Abs Immature Granulocytes: 0.02 10*3/uL (ref 0.00–0.07)
Basophils Absolute: 0 10*3/uL (ref 0.0–0.1)
Basophils Relative: 1 %
Eosinophils Absolute: 0.1 10*3/uL (ref 0.0–0.5)
Eosinophils Relative: 3 %
HCT: 34 % — ABNORMAL LOW (ref 36.0–46.0)
Hemoglobin: 10.8 g/dL — ABNORMAL LOW (ref 12.0–15.0)
Immature Granulocytes: 0 %
Lymphocytes Relative: 12 %
Lymphs Abs: 0.6 10*3/uL — ABNORMAL LOW (ref 0.7–4.0)
MCH: 30.6 pg (ref 26.0–34.0)
MCHC: 31.8 g/dL (ref 30.0–36.0)
MCV: 96.3 fL (ref 80.0–100.0)
Monocytes Absolute: 0.4 10*3/uL (ref 0.1–1.0)
Monocytes Relative: 9 %
Neutro Abs: 3.8 10*3/uL (ref 1.7–7.7)
Neutrophils Relative %: 75 %
Platelet Count: 211 10*3/uL (ref 150–400)
RBC: 3.53 MIL/uL — ABNORMAL LOW (ref 3.87–5.11)
RDW: 15.4 % (ref 11.5–15.5)
WBC Count: 5 10*3/uL (ref 4.0–10.5)
nRBC: 0 % (ref 0.0–0.2)

## 2023-11-02 LAB — CMP (CANCER CENTER ONLY)
ALT: 9 U/L (ref 0–44)
AST: 14 U/L — ABNORMAL LOW (ref 15–41)
Albumin: 3.8 g/dL (ref 3.5–5.0)
Alkaline Phosphatase: 72 U/L (ref 38–126)
Anion gap: 4 — ABNORMAL LOW (ref 5–15)
BUN: 13 mg/dL (ref 8–23)
CO2: 28 mmol/L (ref 22–32)
Calcium: 8.5 mg/dL — ABNORMAL LOW (ref 8.9–10.3)
Chloride: 109 mmol/L (ref 98–111)
Creatinine: 0.88 mg/dL (ref 0.44–1.00)
GFR, Estimated: >60 mL/min (ref >60)
Glucose, Bld: 102 mg/dL — ABNORMAL HIGH (ref 70–99)
Potassium: 4.8 mmol/L (ref 3.5–5.1)
Sodium: 141 mmol/L (ref 135–145)
Total Bilirubin: 0.8 mg/dL (ref 0.0–1.2)
Total Protein: 6.6 g/dL (ref 6.5–8.1)

## 2023-11-02 MED ORDER — DENOSUMAB 120 MG/1.7ML ~~LOC~~ SOLN
120.0000 mg | Freq: Once | SUBCUTANEOUS | Status: AC
  Administered 2023-11-02: 120 mg via SUBCUTANEOUS
  Filled 2023-11-02: qty 1.7

## 2023-11-02 NOTE — Assessment & Plan Note (Addendum)
 Left lumpectomy: Grade 2 IDC with DCIS, 1 cm, margins negative, DCIS focally 0.1 cm from superior margin, 2/2 lymph nodes positive, ER 95%, PR 45%, HER-2 negative, Ki-67 20% AXLND: 3/20 LN Pos (total 5/22)   Treatment Plan: 1. Adj chemo with DD Adriamycin and Cytoxan followed by Taxol weekly X 11 (stopped for neuropathy) completed 04/19/2019 2. Adj XRT 06/01/2019-07/21/2019 3. Adj Anti estrogen therapy with anastrozole started 07/26/2019 4.  Metastatic disease diagnosed 05/13/2023 --------------------------------------------------------------------------------------------- Current treatment: Letrozole and Verzenio 05/10/2023   Bone density 05/05/2022: T score of 0.3: Normal    CT CAP 04/02/2023: Small bilateral pleural effusions, multiple small bilateral pulmonary nodules measuring up to 5 mm, right paratracheal and right hilar lymph nodes indeterminate.  Numerous lytic lesions in axial and appendical skeleton concerning for metastatic disease or myeloma.  Possible acute left seventh eighth ninth rib fractures hypodensities within the liver subcentimeter   05/13/2023: FNA lymph node: Metastatic breast cancer (positive for GATA 3)   Treatment plan: PET/CT scan 04/30/2023: Widespread bone metastases cervical thoracic lumbar spine sternum ribs shoulders and pelvis, hypermetabolic right paratracheal and right hilar lymphadenopathy, tiny pulmonary nodules: Patient has appointment to see Dr. Tonia Brooms for bronchoscopy and biopsy. Current treatment: Anastrozole with Verzenio started 05/04/2023 Bone metastasis: Xgeva along with calcium and vitamin D Dr. Mitzi Hansen for palliative radiation 05/27/2023-06/03/2023   Verzinio toxicities: (Now at 50 mg BiD) Nausea Diarrhea   Brain MRI 07/01/2023: No brain mets Bone mets: Xgeva in 1 month CT CAP 08/06/2023: Interval resolution of right paratracheal lymph node, stable bone metastasis, lung nodules stable CT CAP 10/28/2023: No evidence of progression of metastatic disease.   Stable bone metastases nonspecific groundglass opacity left lower lobe, stable 2 mm right lung nodules, stable para-aortic nodule 1.3 cm  Based on stable findings we will continue with the same treatment.  Repeat scans in 3 months.

## 2023-11-02 NOTE — Progress Notes (Signed)
 Patient Care Team: Zola Button, Grayling Congress, DO as PCP - General Jodi Geralds, MD as Consulting Physician (Orthopedic Surgery) Harriette Bouillon, MD as Consulting Physician (General Surgery) Serena Croissant, MD as Consulting Physician (Hematology and Oncology) Dorothy Puffer, MD as Consulting Physician (Radiation Oncology) Jethro Bolus, MD as Consulting Physician (Ophthalmology) Anselm Lis, RPH-CPP as Pharmacist (Hematology and Oncology) Pickenpack-Cousar, Arty Baumgartner, NP as Nurse Practitioner (Hospice and Palliative Medicine)  DIAGNOSIS:  Encounter Diagnosis  Name Primary?   Malignant neoplasm of upper-outer quadrant of left breast in female, estrogen receptor positive (HCC) Yes    SUMMARY OF ONCOLOGIC HISTORY: Oncology History  Malignant neoplasm of upper-outer quadrant of left breast in female, estrogen receptor positive (HCC)  08/24/2018 Initial Biopsy   Screening detected left breast mass at 2:30 position 6 mm in size with one abnormal lymph node and stable benign calcifications, biopsy of the mass grade 2 IDC with DCIS and lymphovascular invasion, lymph node biopsy is positive, ER 95%, PR 40%, Ki-67 20%, HER-2 -1+ by IHC, T1b N1 stage Ib   08/31/2018 Cancer Staging   Staging form: Breast, AJCC 8th Edition - Clinical: Stage IB (cT1b, cN1(f), cM0, G2, ER+, PR+, HER2-) - Signed by Serena Croissant, MD on 08/31/2018   09/21/2018 Genetic Testing   Genetic testing performed through [facility]'s Common Hereditary Cancers Panel reported out on 09/19/2018 showed no pathogenic mutations.  The Common Hereditary Cancer Panel offered by [facility] includes sequencing and/or deletion duplication testing of the following 53 genes: APC, ATM, AXIN2, BARD1, BMPR1A, BRCA1, BRCA2, BRIP1, BUB1B, CDH1, CDK4, CDKN2A, CHEK2, CTNNA1, DICER1, ENG, EPCAM, GALNT12, GREM1, HOXB13, KIT, MEN1, MLH1, MLH3, MSH2, MSH3, MSH6, MUTYH, NBN, NF1, NTHL1, PALB2, PDGFRA, PMS2, POLD1, POLE, PTEN, RAD50, RAD51C, RAD51D, RNF43, RPS20, SDHA,  SDHB, SDHC, SDHD, SMAD4, SMARCA4, STK11, TP53, TSC1, TSC2, VHL.   A variant of uncertain significance (VUS) in a gene called NBN was also noted. c.1979G>C (p.Arg660Thr)   10/18/2018 Cancer Staging   Staging form: Breast, AJCC 8th Edition - Pathologic stage from 10/18/2018: Stage IB (pT1b, pN2a, cM0, G2, ER+, PR+, HER2-) - Signed by Serena Croissant, MD on 11/14/2018   11/09/2018 Surgery   Left lumpectomy ([facility]) [phone]): Grade 2 IDC with DCIS, 1 cm, margins negative, DCIS focally 0.1 cm from superior margin, 2/2 lymph nodes positive, ER 95%, PR 45%, HER-2 negative, Ki-67 20% AXLND: 3/20 LN Pos (total 5/22)   12/15/2018 -  Chemotherapy   DOXOrubicin (ADRIAMYCIN) chemo injection 142 mg, 60 mg/m2 = 142 mg, Intravenous,  Once, 4 of 4 cycles. Administration: 142 mg (12/15/2018), 142 mg (12/29/2018), 142 mg (01/12/2019), 142 mg (01/26/2019)  palonosetron (ALOXI) injection 0.25 mg, 0.25 mg, Intravenous,  Once, 4 of 4 cycles. Administration: 0.25 mg (12/15/2018), 0.25 mg (12/29/2018), 0.25 mg (01/12/2019), 0.25 mg (01/26/2019)  pegfilgrastim-cbqv (UDENYCA) injection 6 mg, 6 mg, Subcutaneous, Once, 4 of 4 cycles. Administration: 6 mg (12/17/2018), 6 mg (12/31/2018), 6 mg (01/14/2019), 6 mg (01/28/2019)  cyclophosphamide (CYTOXAN) 1,420 mg in sodium chloride 0.9 % 250 mL chemo infusion, 600 mg/m2 = 1,420 mg, Intravenous,  Once, 4 of 4 cycles. Administration: 1,420 mg (12/15/2018), 1,420 mg (12/29/2018), 1,420 mg (01/12/2019), 1,420 mg (01/26/2019)  PACLitaxel (TAXOL) 192 mg in sodium chloride 0.9 % 250 mL chemo infusion (</= 80mg /m2), 80 mg/m2 = 192 mg, Intravenous,  Once, 11 of 12 cycles. Dose modification: 60 mg/m2 (original dose 80 mg/m2, Cycle 12, Reason: Dose not tolerated). Administration: 192 mg (02/09/2019), 192 mg (02/16/2019), 192 mg (02/23/2019), 192 mg (03/02/2019), 192 mg (03/09/2019), 192 mg (  03/16/2019), 192 mg (03/23/2019), 144 mg (03/30/2019), 144 mg (04/06/2019), 144 mg (04/14/2019), 144 mg (04/20/2019)  fosaprepitant  (EMEND) 150 mg  dexamethasone (DECADRON) 12 mg in sodium chloride 0.9 % 145 mL IVPB, , Intravenous,  Once, 4 of 4 cycles Administration:  (12/15/2018),  (12/29/2018),  (01/12/2019),  (01/26/2019)     06/01/2019 - 07/21/2019 Radiation Therapy   The patient initially received a dose of 50.4 Gy in 28 fractions to the breast using whole-breast tangent fields. This was delivered using a 3-D conformal technique. The pt received a boost delivering an additional 10 Gy in 5 fractions using a electron boost with electrons. The total dose was 60.4 Gy.    08/2019 - 04/2023 Anti-estrogen oral therapy   Anastrozole   04/02/2023 Relapse/Recurrence   CT CAP 04/02/2023: Small bilateral pleural effusions, multiple small bilateral pulmonary nodules measuring up to 5 mm, right paratracheal and right hilar lymph nodes indeterminate.  Numerous lytic lesions in axial and appendical skeleton concerning for metastatic disease or myeloma.  Possible acute left seventh eighth ninth rib fractures hypodensities within the liver subcentimeter     04/30/2023 PET scan   PET/CT scan 04/30/2023: Widespread bone metastases cervical thoracic lumbar spine sternum ribs shoulders and pelvis, hypermetabolic right paratracheal and right hilar lymphadenopathy, tiny pulmonary nodules: Patient has appointment to see Dr. Tonia Brooms for bronchoscopy and biopsy.   05/07/2023 Treatment Plan Change   Letrozole daily, Caffie Damme   05/10/2023 - 06/03/2023 Radiation Therapy   Plan Name: Chest_R_Ribs Site: Ribs, Right Technique: Isodose Plan Mode: Photon Dose Per Fraction: 3 Gy Prescribed Dose (Delivered / Prescribed): 30 Gy / 30 Gy Prescribed Fxs (Delivered / Prescribed): 10 / 10   Plan Name: Chest_L_Ribs Site: Ribs, Left Technique: Isodose Plan Mode: Photon Dose Per Fraction: 3 Gy Prescribed Dose (Delivered / Prescribed): 30 Gy / 30 Gy Prescribed Fxs (Delivered / Prescribed): 10 / 10   Plan Name: Pelvis Site: [place],  Right Technique: Isodose Plan Mode: Photon Dose Per Fraction: 3 Gy Prescribed Dose (Delivered / Prescribed): 21 Gy / 21 Gy Prescribed Fxs (Delivered / Prescribed): 7 / 7   Plan Name: Pelvis:1 Site: [place], Right Technique: Isodose Plan Mode: Photon Dose Per Fraction: 3 Gy Prescribed Dose (Delivered / Prescribed): 9 Gy / 9 Gy Prescribed Fxs (Delivered / Prescribed): 3 / 3   Plan Name: Pelvis_Bst Site: Pelvis Technique: Isodose Plan Mode: Photon Dose Per Fraction: 4 Gy Prescribed Dose (Delivered / Prescribed): 12 Gy / 12 Gy Prescribed Fxs (Delivered / Prescribed): 3 / 3       CHIEF COMPLIANT: Follow-up to review scans  HISTORY OF PRESENT ILLNESS:  History of Present Illness The patient, with a history of cancer, presents for a follow-up visit after a recent scan. She reports overall improvement, with the scan indicating healing of numerous bone spots and no worsening of cancer. However, she has experienced a dizzy spell at work a couple of weeks ago, which was unexpected and sporadic. She has had a couple of these episodes before, but they were not frequent. The patient is unsure if this is a side effect of the medication she is taking for her cancer.     ALLERGIES:  is allergic to gadolinium derivatives, erythromycin, lisinopril, penicillins, and adhesive [tape].  MEDICATIONS:  Current Outpatient Medications  Medication Sig Dispense Refill   abemaciclib (VERZENIO) 50 MG tablet Take 1 tablet (50 mg total) by mouth 2 (two) times daily. Swallow tablets whole. Do not chew, crush, or split tablets before swallowing. 56  tablet 5   amLODipine (NORVASC) 5 MG tablet Take 1 tablet (5 mg total) by mouth daily. Needs ov before any more refills 90 tablet 1   Calcium Carbonate-Vitamin D (CALCIUM-VITAMIN D PO) Take by mouth.     carvedilol (COREG) 25 MG tablet Take 1 tablet (25 mg total) by mouth 2 (two) times daily with a meal. Due for appt 03/2023 180 tablet 1   gabapentin (NEURONTIN) 300  MG capsule Take 1 capsule (300 mg total) by mouth 2 (two) times daily. 60 capsule 1   letrozole (FEMARA) 2.5 MG tablet Take 1 tablet (2.5 mg total) by mouth daily. 90 tablet 3   meloxicam (MOBIC) 15 MG tablet TAKE 1 TABLET(15 MG) BY MOUTH DAILY AS NEEDED FOR PAIN 90 tablet 1   SYNTHROID 100 MCG tablet Take 1 tablet (100 mcg total) by mouth daily before breakfast. 90 tablet 1   EPINEPHRINE 0.3 mg/0.3 mL IJ SOAJ injection INJECT 0.3 ML(1 SYRINGE) IN THE MUSCLE AS NEEDED FOR ALLERGIC REACTION (Patient not taking: Reported on 11/02/2023) 2 Device 0   HYDROcodone-acetaminophen (NORCO) 5-325 MG tablet Take 1-2 tablets by mouth every 4 (four) hours as needed. (Patient not taking: Reported on 11/02/2023) 60 tablet 0   ondansetron (ZOFRAN) 8 MG tablet 1/2 to 1 tablet q 12 hours prn nausea (Patient not taking: Reported on 11/02/2023) 20 tablet 3   prochlorperazine (COMPAZINE) 10 MG tablet Take 1 tablet (10 mg total) by mouth every 6 (six) hours as needed for nausea or vomiting. (Patient not taking: Reported on 11/02/2023) 30 tablet 2   valACYclovir (VALTREX) 1000 MG tablet TAKE 1 TABLET BY MOUTH THREE TIMES DAILY AS NEEDED (Patient not taking: Reported on 10/12/2023) 30 tablet 2   No current facility-administered medications for this visit.    PHYSICAL EXAMINATION: ECOG PERFORMANCE STATUS: 1 - Symptomatic but completely ambulatory  Vitals:   11/02/23 1216  BP: 124/63  Pulse: 63  Resp: 17  Temp: (!) 97.3 F (36.3 C)  SpO2: 100%   Filed Weights   11/02/23 1216  Weight: 227 lb 1.6 oz (103 kg)      LABORATORY DATA:  I have reviewed the data as listed    Latest Ref Rng & Units 11/02/2023   11:20 AM 10/12/2023   12:29 PM 08/09/2023    1:25 PM  CMP  Glucose 70 - 99 mg/dL 829  562  96   BUN 8 - 23 mg/dL 13  14  13    Creatinine 0.44 - 1.00 mg/dL 1.30  8.65  7.84   Sodium 135 - 145 mmol/L 141  142  141   Potassium 3.5 - 5.1 mmol/L 4.8  4.6  4.5   Chloride 98 - 111 mmol/L 109  110  109   CO2 22 - 32  mmol/L 28  27  27    Calcium 8.9 - 10.3 mg/dL 8.5  8.2  9.0   Total Protein 6.5 - 8.1 g/dL 6.6  6.8  6.7   Total Bilirubin 0.0 - 1.2 mg/dL 0.8  0.7  0.7   Alkaline Phos 38 - 126 U/L 72  76  144   AST 15 - 41 U/L 14  13  11    ALT 0 - 44 U/L 9  8  6      Lab Results  Component Value Date   WBC 5.0 11/02/2023   HGB 10.8 (L) 11/02/2023   HCT 34.0 (L) 11/02/2023   MCV 96.3 11/02/2023   PLT 211 11/02/2023   NEUTROABS 3.8 11/02/2023  ASSESSMENT & PLAN:  Malignant neoplasm of upper-outer quadrant of left breast in female, estrogen receptor positive (HCC) Left lumpectomy: Grade 2 IDC with DCIS, 1 cm, margins negative, DCIS focally 0.1 cm from superior margin, 2/2 lymph nodes positive, ER 95%, PR 45%, HER-2 negative, Ki-67 20% AXLND: 3/20 LN Pos (total 5/22)   Treatment Plan: 1. Adj chemo with DD Adriamycin and Cytoxan followed by Taxol weekly X 11 (stopped for neuropathy) completed 04/19/2019 2. Adj XRT 06/01/2019-07/21/2019 3. Adj Anti estrogen therapy with anastrozole started 07/26/2019 4.  Metastatic disease diagnosed 05/13/2023 --------------------------------------------------------------------------------------------- Current treatment: Letrozole and Verzenio 05/10/2023   Bone density 05/05/2022: T score of 0.3: Normal    CT CAP 04/02/2023: Small bilateral pleural effusions, multiple small bilateral pulmonary nodules measuring up to 5 mm, right paratracheal and right hilar lymph nodes indeterminate.  Numerous lytic lesions in axial and appendical skeleton concerning for metastatic disease or myeloma.  Possible acute left seventh eighth ninth rib fractures hypodensities within the liver subcentimeter   05/13/2023: FNA lymph node: Metastatic breast cancer (positive for GATA 3)   Treatment plan: PET/CT scan 04/30/2023: Widespread bone metastases cervical thoracic lumbar spine sternum ribs shoulders and pelvis, hypermetabolic right paratracheal and right hilar lymphadenopathy, tiny pulmonary  nodules: Patient has appointment to see Dr. Tonia Brooms for bronchoscopy and biopsy. Current treatment: Anastrozole with Verzenio started 05/04/2023 Bone metastasis: Xgeva along with calcium and vitamin D Dr. Mitzi Hansen for palliative radiation 05/27/2023-06/03/2023   Verzinio toxicities: (Now at 50 mg BiD) Nausea Diarrhea   Brain MRI 07/01/2023: No brain mets Bone mets: Xgeva in 1 month CT CAP 08/06/2023: Interval resolution of right paratracheal lymph node, stable bone metastasis, lung nodules stable CT CAP 10/28/2023: No evidence of progression of metastatic disease.  Stable bone metastases nonspecific groundglass opacity left lower lobe, stable 2 mm right lung nodules, stable para-aortic nodule 1.3 cm  Based on stable findings we will continue with the same treatment.  Repeat scans in 3 months. ------------------------------------- Assessment and Plan Assessment & Plan Malignant neoplasm of upper-outer quadrant of left breast, estrogen receptor positive No cancer progression. Healing bone metastases. Managed lung nodules and abdominal lymph node. Anemia with hemoglobin at 10.8. Improving calcium levels. Adequate white blood cell count. Normal platelets. Normal kidney and liver function. Acceptable blood glucose. Potential respiratory side effects from medication. - Continue Abemaciclib and Letrozole. - Monitor for respiratory side effects, including coughing, which may necessitate discontinuation of medication. - Administer bone injection today. - Schedule next scan in six months. - Schedule lab work and injection with John in three months.  Dizziness Intermittent dizziness possibly due to dehydration or blood pressure fluctuations. Reports adequate hydration and typically elevated blood pressure. - Monitor for further episodes and ensure adequate hydration.  Kidney stones Presence of stones in the left kidney. No acute symptoms.  Diverticulosis Presence of diverticulosis. No acute  symptoms.      Orders Placed This Encounter  Procedures   CT CHEST ABDOMEN PELVIS W CONTRAST    Standing Status:   Future    Expected Date:   05/04/2024    Expiration Date:   11/01/2024    If indicated for the ordered procedure, I authorize the administration of contrast media per Radiology protocol:   Yes    Does the patient have a contrast media/X-ray dye allergy?:   No    Preferred imaging location?:   [facility]    Release to patient:   Immediate    If indicated for the ordered procedure, I authorize  the administration of oral contrast media per Radiology protocol:   Yes   The patient has a good understanding of the overall plan. she agrees with it. she will call with any problems that may develop before the next visit here. Total time spent: 30 mins including face to face time and time spent for planning, charting and co-ordination of care   Tamsen Meek, MD 11/02/23

## 2023-11-03 ENCOUNTER — Other Ambulatory Visit: Payer: Self-pay

## 2023-11-03 NOTE — Progress Notes (Signed)
 Specialty Pharmacy Refill Coordination Note  Kayla Banks is a 68 y.o. female contacted today regarding refills of specialty medication(s) Abemaciclib [name])   Patient requested Delivery   Delivery date: 11/05/23   Verified address: [address]   [name] [phone]   Medication will be filled on 11/04/23.

## 2023-11-04 ENCOUNTER — Other Ambulatory Visit: Payer: Self-pay

## 2023-11-04 ENCOUNTER — Other Ambulatory Visit (HOSPITAL_COMMUNITY): Payer: Self-pay

## 2023-11-23 ENCOUNTER — Other Ambulatory Visit: Payer: Self-pay | Admitting: Nurse Practitioner

## 2023-11-23 DIAGNOSIS — Z170 Estrogen receptor positive status [ER+]: Secondary | ICD-10-CM

## 2023-11-23 DIAGNOSIS — Z515 Encounter for palliative care: Secondary | ICD-10-CM

## 2023-11-23 DIAGNOSIS — G629 Polyneuropathy, unspecified: Secondary | ICD-10-CM

## 2023-11-23 DIAGNOSIS — C7951 Secondary malignant neoplasm of bone: Secondary | ICD-10-CM

## 2023-11-26 ENCOUNTER — Other Ambulatory Visit: Payer: Self-pay

## 2023-12-01 ENCOUNTER — Other Ambulatory Visit: Payer: Self-pay

## 2023-12-17 ENCOUNTER — Other Ambulatory Visit: Payer: Self-pay

## 2023-12-17 NOTE — Progress Notes (Signed)
 Specialty Pharmacy Refill Coordination Note  Kayla Banks is a 68 y.o. female contacted today regarding refills of specialty medication(s) Abemaciclib  (VERZENIO )   Patient requested Delivery   Delivery date: 12/21/23   Verified address: [address]   [place] [id number]   Medication will be filled on 12/20/23.

## 2023-12-20 ENCOUNTER — Other Ambulatory Visit: Payer: Self-pay

## 2023-12-21 ENCOUNTER — Encounter: Payer: Self-pay | Admitting: Pharmacist

## 2024-01-04 ENCOUNTER — Ambulatory Visit (INDEPENDENT_AMBULATORY_CARE_PROVIDER_SITE_OTHER): Admitting: Family Medicine

## 2024-01-04 VITALS — BP 160/80 | HR 66 | Temp 98.0°F | Resp 18 | Ht 66.0 in | Wt 229.4 lb

## 2024-01-04 DIAGNOSIS — I10 Essential (primary) hypertension: Secondary | ICD-10-CM

## 2024-01-04 DIAGNOSIS — Z1211 Encounter for screening for malignant neoplasm of colon: Secondary | ICD-10-CM

## 2024-01-04 DIAGNOSIS — C50412 Malignant neoplasm of upper-outer quadrant of left female breast: Secondary | ICD-10-CM

## 2024-01-04 DIAGNOSIS — M170 Bilateral primary osteoarthritis of knee: Secondary | ICD-10-CM | POA: Diagnosis not present

## 2024-01-04 DIAGNOSIS — E039 Hypothyroidism, unspecified: Secondary | ICD-10-CM

## 2024-01-04 DIAGNOSIS — Z170 Estrogen receptor positive status [ER+]: Secondary | ICD-10-CM

## 2024-01-04 DIAGNOSIS — E785 Hyperlipidemia, unspecified: Secondary | ICD-10-CM

## 2024-01-04 LAB — LIPID PANEL
Cholesterol: 229 mg/dL — ABNORMAL HIGH (ref 0–200)
HDL: 80.5 mg/dL (ref >39.00)
LDL Cholesterol: 134 mg/dL — ABNORMAL HIGH (ref 0–99)
NonHDL: 148.04
Total CHOL/HDL Ratio: 3
Triglycerides: 71 mg/dL (ref 0.0–149.0)
VLDL: 14.2 mg/dL (ref 0.0–40.0)

## 2024-01-04 LAB — COMPREHENSIVE METABOLIC PANEL WITH GFR
ALT: 9 U/L (ref 0–35)
AST: 16 U/L (ref 0–37)
Albumin: 4 g/dL (ref 3.5–5.2)
Alkaline Phosphatase: 61 U/L (ref 39–117)
BUN: 11 mg/dL (ref 6–23)
CO2: 26 meq/L (ref 19–32)
Calcium: 8.7 mg/dL (ref 8.4–10.5)
Chloride: 104 meq/L (ref 96–112)
Creatinine, Ser: 0.72 mg/dL (ref 0.40–1.20)
GFR: 87.43 mL/min
Glucose, Bld: 99 mg/dL (ref 70–99)
Potassium: 4.1 meq/L (ref 3.5–5.1)
Sodium: 139 meq/L (ref 135–145)
Total Bilirubin: 0.8 mg/dL (ref 0.2–1.2)
Total Protein: 7.3 g/dL (ref 6.0–8.3)

## 2024-01-04 LAB — CBC WITH DIFFERENTIAL/PLATELET
Basophils Absolute: 0 10*3/uL (ref 0.0–0.1)
Basophils Relative: 0.9 % (ref 0.0–3.0)
Eosinophils Absolute: 0.1 10*3/uL (ref 0.0–0.7)
Eosinophils Relative: 1.3 % (ref 0.0–5.0)
HCT: 35.4 % — ABNORMAL LOW (ref 36.0–46.0)
Hemoglobin: 11.7 g/dL — ABNORMAL LOW (ref 12.0–15.0)
Lymphocytes Relative: 11.4 % — ABNORMAL LOW (ref 12.0–46.0)
Lymphs Abs: 0.6 10*3/uL — ABNORMAL LOW (ref 0.7–4.0)
MCHC: 33.1 g/dL (ref 30.0–36.0)
MCV: 94.7 fl (ref 78.0–100.0)
Monocytes Absolute: 0.5 10*3/uL (ref 0.1–1.0)
Monocytes Relative: 9.9 % (ref 3.0–12.0)
Neutro Abs: 3.9 10*3/uL (ref 1.4–7.7)
Neutrophils Relative %: 76.5 % (ref 43.0–77.0)
Platelets: 241 10*3/uL (ref 150.0–400.0)
RBC: 3.74 Mil/uL — ABNORMAL LOW (ref 3.87–5.11)
RDW: 16 % — ABNORMAL HIGH (ref 11.5–15.5)
WBC: 5.1 10*3/uL (ref 4.0–10.5)

## 2024-01-04 LAB — TSH: TSH: 2.55 u[IU]/mL (ref 0.35–5.50)

## 2024-01-04 MED ORDER — AMLODIPINE BESYLATE 5 MG PO TABS: 5.0000 mg | ORAL_TABLET | Freq: Every day | ORAL | 1 refills | Status: DC

## 2024-01-04 MED ORDER — MELOXICAM 15 MG PO TABS: 15.0000 mg | ORAL_TABLET | Freq: Every day | ORAL | 1 refills | Status: DC

## 2024-01-04 MED ORDER — CARVEDILOL 25 MG PO TABS: 25.0000 mg | ORAL_TABLET | Freq: Two times a day (BID) | ORAL | 1 refills | Status: DC

## 2024-01-04 MED ORDER — SYNTHROID 100 MCG PO TABS: 100.0000 ug | ORAL_TABLET | Freq: Every day | ORAL | 1 refills | Status: AC

## 2024-01-04 NOTE — Assessment & Plan Note (Signed)
 no changes to meds. Encouraged heart healthy diet such as the DASH diet and exercise as tolerated.   Running high today but has been good at oncology office

## 2024-01-04 NOTE — Progress Notes (Signed)
 Established Patient Office Visit  Subjective   Patient ID: Kayla Banks, female    DOB: [birth year]  Age: 68 y.o. MRN: [id number]  Chief Complaint  Patient presents with   Hypertension   Hypothyroidism   Follow-up    HPI Discussed the use of AI scribe software for clinical note transcription with the patient, who gave verbal consent to proceed.  History of Present Illness Kayla Banks "[name]" is a 68 year old female with hypertension and neuropathy who presents for medication refills and follow-up.  She requires refills for Synthroid , carvedilol , amlodipine , and meloxicam . She does not currently need refills for Valtrex  or an EpiPen , although she has allergies and previously kept an EpiPen  on hand.  She has a history of cancer treatment, including radiation and oral medications, and no longer receives infusions. Her last CT scan was reviewed, and she anticipates another scan in June. Her oncologist advised against a mammogram during her last cancer treatment period.  She experiences swelling in her ankles and has a history of neuropathy. Previous medication affected her mobility, causing balance issues and necessitating the use of a cane or walker. Despite these challenges, she can feel the bottom of her feet.  Her blood pressure was noted to be high today, although she took her medication. She attributes this to recent increased activity and possibly dietary salt intake due to frequent dining out. She has a home blood pressure monitor but has not used it recently.  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 Her mood is stable.   Patient Active Problem List   Diagnosis Date Noted   Acquired hypothyroidism 01/04/2024   Adenopathy 05/10/2023   Metastasis to bone (HCC) 05/05/2023   Chemotherapy-induced peripheral neuropathy (HCC) 03/30/2019   Port-A-Cath in place 12/15/2018   Genetic testing 09/21/2018   Family history of breast cancer     Malignant neoplasm of upper-outer quadrant of left breast in female, estrogen receptor positive (HCC) 08/29/2018   Hyperlipidemia 08/04/2018   Obesity (BMI 30-39.9) 05/09/2013   Preventative health care 11/19/2010   URI (upper respiratory infection) 11/19/2010   KNEE PAIN, BILATERAL 09/25/2010   SINUSITIS - ACUTE-NOS 12/24/2009   MORBID OBESITY 06/14/2009   COLD SORE 10/16/2008   SKIN TAG 06/18/2008   Essential hypertension 03/17/2007   Past Medical History:  Diagnosis Date   Breast cancer (HCC)    Complication of anesthesia    Family history of breast cancer    Heart murmur    no problems, saw cardiologist 2010   Hypertension    on meds   Osteoarthritis    Personal history of chemotherapy    Personal history of radiation therapy    left   Plantar fasciitis    PONV (postoperative nausea and vomiting)    Thyroid  disease    Hypothyroidism   Past Surgical History:  Procedure Laterality Date   ABDOMINAL HYSTERECTOMY  02/2012   AXILLARY LYMPH NODE DISSECTION Left 11/09/2018   Procedure: LEFT AXILLARY LYMPH NODE DISSECTION;  Surgeon: Sim Dryer, MD;  Location: [facility];  Service: General;  Laterality: Left;   BREAST BIOPSY Left    malignant   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/11/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   BREAST LUMPECTOMY WITH RADIOACTIVE SEED AND SENTINEL LYMPH NODE BIOPSY Left 10/11/2018   Procedure: LEFT BREAST LUMPECTOMY WITH RADIOACTIVE SEED AND LEFT SENTINEL LYMPH NODE MAPPING WITH LEFT TARGETED LYMPH NODE BIOPSY;  Surgeon: Sim Dryer, MD;  Location: [facility];  Service: General;  Laterality: Left;   BRONCHIAL NEEDLE ASPIRATION BIOPSY  05/13/2023   Procedure: BRONCHIAL NEEDLE ASPIRATION BIOPSIES;  Surgeon: Josiah Nigh, MD;  Location: [facility] END[facility]COPY;  Service: Pulmonary;;   CATARACT EXTRACTION Bilateral 11/3  06/26/20   shipiro    FOOT SURGERY Left    bone spurs   LUMBAR LAMINECTOMY  1990   PORT-A-CATH REMOVAL N/A  05/23/2019   Procedure: PORT REMOVAL;  Surgeon: Sim Dryer, MD;  Location: [facility];  Service: General;  Laterality: N/A;   PORTACATH PLACEMENT Right 12/01/2018   Procedure: INSERTION PORT-A-CATH WITH ULTRASOUND;  Surgeon: Sim Dryer, MD;  Location: [facility];  Service: General;  Laterality: Right;   VIDEO BRONCHOSCOPY WITH ENDOBRONCHIAL ULTRASOUND Bilateral 05/13/2023   Procedure: VIDEO BRONCHOSCOPY WITH ENDOBRONCHIAL ULTRASOUND;  Surgeon: Josiah Nigh, MD;  Location: [facility];  Service: Pulmonary;  Laterality: Bilateral;   Social History   Tobacco Use   Smoking status: Never   Smokeless tobacco: Never  Vaping Use   Vaping status: Never Used  Substance Use Topics   Alcohol use: Not Currently    Comment: occassionally   Drug use: Never   Social History   Socioeconomic History   Marital status: Single    Spouse name: Not on file   Number of children: Not on file   Years of education: Not on file   Highest education level: Master's degree (e.g., MA, MS, MEng, MEd, MSW, MBA)  Occupational History   Occupation: [employer]  Tobacco Use   Smoking status: Never   Smokeless tobacco: Never  Vaping Use   Vaping status: Never Used  Substance and Sexual Activity   Alcohol use: Not Currently    Comment: occassionally   Drug use: Never   Sexual activity: Not Currently    Partners: Male  Other Topics Concern   Not on file  Social History Narrative   Exercise-- no   Social Drivers of Health   Financial Resource Strain: Low Risk  (01/04/2024)   Overall Financial Resource Strain (CARDIA)    Difficulty of Paying Living Expenses: Not very hard  Food Insecurity: No Food Insecurity (01/04/2024)   Hunger Vital Sign    Worried About Running Out of Food in the Last Year: Never true    Ran Out of Food in the Last Year: Never true  Transportation Needs: No Transportation Needs (01/04/2024)   PRAPARE - Scientist, research (physical sciences) (Medical): No    Lack of Transportation (Non-Medical): No  Physical Activity: Unknown (01/04/2024)   Exercise Vital Sign    Days of Exercise per Week: 0 days    Minutes of Exercise per Session: Not on file  Stress: Stress Concern Present (01/04/2024)   [employer] of Occupational Health - Occupational Stress Questionnaire    Feeling of Stress : To some extent  Social Connections: Moderately Integrated (01/04/2024)   Social Connection and Isolation Panel [NHANES]    Frequency of Communication with Friends and Family: Once a week    Frequency of Social Gatherings with Friends and Family: More than three times a week    Attends Religious Services: More than 4 times per year    Active Member of [employer] or Organizations: Yes    Attends Engineer, structural: More than 4 times per year    Marital Status: Never married  Intimate Partner Violence: Not on file   Family Status  Relation Name Status   Mother  Alive   Father  Deceased   Sister  Alive   Sister  Alive   Mat Aunt  Deceased   Other MGaung Alive  No partnership data on file      Review of Systems  Constitutional:  Negative for chills, fever and malaise/fatigue.  HENT:  Negative for congestion and hearing loss.   Eyes:  Negative for discharge.  Respiratory:  Negative for cough, sputum production and shortness of breath.   Cardiovascular:  Negative for chest pain, palpitations and leg swelling.  Gastrointestinal:  Negative for abdominal pain, blood in stool, constipation, diarrhea, heartburn, nausea and vomiting.  Genitourinary:  Negative for dysuria, frequency, hematuria and urgency.  Musculoskeletal:  Negative for back pain, falls and myalgias.  Skin:  Negative for rash.  Neurological:  Negative for dizziness, sensory change, loss of consciousness, weakness and headaches.  Endo/Heme/Allergies:  Negative for environmental allergies. Does not bruise/bleed easily.  Psychiatric/Behavioral:  Negative for  depression and suicidal ideas. The patient is not nervous/anxious and does not have insomnia.       Objective:     BP (!) 160/80 (BP Location: Right Arm, Patient Position: Sitting, Cuff Size: Large)   Pulse 66   Temp 98 F (36.7 C) (Oral)   Resp 18   Ht 5\' 6"  (1.676 m)   Wt 229 lb 6.4 oz (104.1 kg)   LMP 01/28/2012   SpO2 99%   BMI 37.03 kg/m  BP Readings from Last 3 Encounters:  01/04/24 (!) 160/80  11/02/23 124/63  10/12/23 (!) 135/59   Wt Readings from Last 3 Encounters:  01/04/24 229 lb 6.4 oz (104.1 kg)  11/02/23 227 lb 1.6 oz (103 kg)  10/12/23 227 lb 6.4 oz (103.1 kg)   SpO2 Readings from Last 3 Encounters:  01/04/24 99%  11/02/23 100%  10/12/23 100%      Physical Exam Vitals and nursing note reviewed.  Constitutional:      General: She is not in acute distress.    Appearance: Normal appearance. She is well-developed.  HENT:     Head: Normocephalic and atraumatic.  Eyes:     General: No scleral icterus.       Right eye: No discharge.        Left eye: No discharge.  Cardiovascular:     Rate and Rhythm: Normal rate and regular rhythm.     Heart sounds: No murmur heard. Pulmonary:     Effort: Pulmonary effort is normal. No respiratory distress.     Breath sounds: Normal breath sounds.  Musculoskeletal:        General: Normal range of motion.     Cervical back: Normal range of motion and neck supple.     Right lower leg: No edema.     Left lower leg: No edema.  Skin:    General: Skin is warm and dry.  Neurological:     General: No focal deficit present.     Mental Status: She is alert and oriented to person, place, and time.  Psychiatric:        Mood and Affect: Mood normal.        Behavior: Behavior normal.        Thought Content: Thought content normal.        Judgment: Judgment normal.      No results found for any visits on 01/04/24.  Last CBC Lab Results  Component Value Date   WBC 5.0 11/02/2023   HGB  10.8 (L) 11/02/2023   HCT  34.0 (L) 11/02/2023   MCV 96.3 11/02/2023   MCH 30.6 11/02/2023   RDW 15.4 11/02/2023   PLT 211 11/02/2023   Last metabolic panel Lab Results  Component Value Date   GLUCOSE 102 (H) 11/02/2023   NA 141 11/02/2023   K 4.8 11/02/2023   CL 109 11/02/2023   CO2 28 11/02/2023   BUN 13 11/02/2023   CREATININE 0.88 11/02/2023   GFRNONAA >60 11/02/2023   CALCIUM 8.5 (L) 11/02/2023   PROT 6.6 11/02/2023   ALBUMIN 3.8 11/02/2023   BILITOT 0.8 11/02/2023   ALKPHOS 72 11/02/2023   AST 14 (L) 11/02/2023   ALT 9 11/02/2023   ANIONGAP 4 (L) 11/02/2023   Last lipids Lab Results  Component Value Date   CHOL 173 04/09/2023   HDL 55 04/09/2023   LDLCALC 99 04/09/2023   TRIG 93 04/09/2023   CHOLHDL 3.1 04/09/2023   Last hemoglobin A1c Lab Results  Component Value Date   HGBA1C 6.1 02/03/2022   Last thyroid  functions Lab Results  Component Value Date   TSH 3.75 04/09/2023   Last vitamin D No results found for: "25OHVITD2", "25OHVITD3", "VD25OH" Last vitamin B12 and Folate No results found for: "VITAMINB12", "FOLATE"    The 10-year ASCVD risk score (Arnett DK, et al., 2019) is: 13.4%    Assessment & Plan:   Problem List Items Addressed This Visit       Unprioritized   Hyperlipidemia   Relevant Medications   amLODipine  (NORVASC ) 5 MG tablet   carvedilol  (COREG ) 25 MG tablet   Other Relevant Orders   CBC with Differential/Platelet   Comprehensive metabolic panel with GFR   Lipid panel   TSH   Malignant neoplasm of upper-outer quadrant of left breast in female, estrogen receptor positive (HCC)   Per oncology      Essential hypertension    no changes to meds. Encouraged heart healthy diet such as the DASH diet and exercise as tolerated.   Running high today but has been good at oncology office       Relevant Medications   amLODipine  (NORVASC ) 5 MG tablet   carvedilol  (COREG ) 25 MG tablet   Other Relevant Orders   CBC with Differential/Platelet   Comprehensive  metabolic panel with GFR   Lipid panel   TSH   Acquired hypothyroidism - Primary   Check labs  Con't synthroid        Relevant Medications   carvedilol  (COREG ) 25 MG tablet   SYNTHROID  100 MCG tablet   Other Relevant Orders   CBC with Differential/Platelet   Comprehensive metabolic panel with GFR   Lipid panel   TSH   Other Visit Diagnoses       Primary osteoarthritis of both knees       Relevant Medications   meloxicam  (MOBIC ) 15 MG tablet     Hypothyroidism       Relevant Medications   carvedilol  (COREG ) 25 MG tablet   SYNTHROID  100 MCG tablet   Other Relevant Orders   CBC with Differential/Platelet   Comprehensive metabolic panel with GFR   Lipid panel   TSH     Colon cancer screening       Relevant Orders   Ambulatory referral to Gastroenterology     Assessment and Plan Assessment & Plan Hypertension   Blood pressure is elevated today, likely due to recent physical activity and dietary salt intake, though it was previously well-controlled at the oncologist's office. Schedule  a nurse visit in 2-3 weeks for a blood pressure check. Advise monitoring salt intake, choosing healthier food options, increasing water intake, and avoiding stress before appointments.  Peripheral neuropathy   This condition contributes to ankle swelling and balance issues, possibly worsened by previous strong medication affecting mobility. She uses a cane and occasionally a walker for balance. Recommend wearing compression stockings, preferably 15-20 mmHg, especially when walking or standing for extended periods.  Cancer   A recent CT scan was favorable. She is currently receiving radiation and oral medication, with the next scan anticipated in June. The mammogram is postponed as per the oncologist's advice due to recent treatment. Await results of the upcoming CT scan in June.  General Health Maintenance   A colonoscopy has not been completed. A referral was made previously but not scheduled.  Discuss colonoscopy scheduling with her sister's assistance for transportation.    Return in about 3 weeks (around 01/25/2024), or if symptoms worsen or fail to improve, for nurse visit for bp check ---- also schedule cpe in 6 months.    Anntonette Madewell R Lowne Chase, DO

## 2024-01-04 NOTE — Assessment & Plan Note (Signed)
 Per oncology

## 2024-01-04 NOTE — Patient Instructions (Signed)

## 2024-01-04 NOTE — Assessment & Plan Note (Signed)
Check labs  Con't synthroid 

## 2024-01-11 ENCOUNTER — Other Ambulatory Visit (HOSPITAL_COMMUNITY): Payer: Self-pay

## 2024-01-11 NOTE — Progress Notes (Signed)
 Specialty Pharmacy Ongoing Clinical Assessment Note  Kayla Banks is a 68 y.o. female who is being followed by the specialty pharmacy service for RxSp Oncology   Patient's specialty medication(s) reviewed today: Abemaciclib  (VERZENIO )   Missed doses in the last 4 weeks: 1   Patient/Caregiver did not have any additional questions or concerns.   Therapeutic benefit summary: Patient is achieving benefit   Adverse events/side effects summary: No adverse events/side effects   Patient's therapy is appropriate to: Continue    Goals Addressed             This Visit's Progress    Slow Disease Progression   On track    Patient is on trackdue to recent start of therapy. Patient will maintain adherence. Last CT from 10/28/23 showed stable metastatic disease with no signs of progression.          Follow up: 3 months  Malachi Screws Specialty Pharmacist

## 2024-01-16 ENCOUNTER — Ambulatory Visit: Payer: Self-pay | Admitting: Family Medicine

## 2024-01-25 ENCOUNTER — Ambulatory Visit (INDEPENDENT_AMBULATORY_CARE_PROVIDER_SITE_OTHER): Admitting: Family Medicine

## 2024-01-25 ENCOUNTER — Encounter: Payer: Self-pay | Admitting: Family Medicine

## 2024-01-25 VITALS — BP 122/69 | HR 65 | Temp 98.1°F | Ht 66.0 in | Wt 227.8 lb

## 2024-01-25 DIAGNOSIS — I10 Essential (primary) hypertension: Secondary | ICD-10-CM

## 2024-01-25 NOTE — Assessment & Plan Note (Signed)
 Well controlled, no changes to meds. Encouraged heart healthy diet such as the DASH diet and exercise as tolerated.

## 2024-01-25 NOTE — Patient Instructions (Signed)

## 2024-01-25 NOTE — Progress Notes (Signed)
 Established Patient Office Visit  Subjective   Patient ID: Kayla Banks, female    DOB: 02-04-1956  Age: 68 y.o. MRN: [id number]  Chief Complaint  Patient presents with   Medical Management of Chronic Issues    2 wks follow up on BP. Home BP range from 111/64 to 156/82. Pt brought BP monitor from home to compare.     HPI Discussed the use of AI scribe software for clinical note transcription with the patient, who gave verbal consent to proceed.  History of Present Illness Kayla Banks "[name]" is a 68 year old female with hypertension who presents for a blood pressure follow-up.  During her last visit on May 20, her blood pressure was noted to be elevated. Recent blood pressure readings over the past week have been 156/82, 111/64, and 145/85. She feels well and reports no chest pain, shortness of breath, or ankle swelling.  She has not followed up on a previously discussed colonoscopy. She recalls a conversation about it last year and missed a call regarding scheduling due to not recognizing the number. She did not pursue it further as she was not keen on undergoing the procedure.   Patient Active Problem List   Diagnosis Date Noted   Acquired hypothyroidism 01/04/2024   Adenopathy 05/10/2023   Metastasis to bone (HCC) 05/05/2023   Chemotherapy-induced peripheral neuropathy (HCC) 03/30/2019   Port-A-Cath in place 12/15/2018   Genetic testing 09/21/2018   Family history of breast cancer    Malignant neoplasm of upper-outer quadrant of left breast in female, estrogen receptor positive (HCC) 08/29/2018   Hyperlipidemia 08/04/2018   Obesity (BMI 30-39.9) 05/09/2013   Preventative health care 11/19/2010   URI (upper respiratory infection) 11/19/2010   KNEE PAIN, BILATERAL 09/25/2010   SINUSITIS - ACUTE-NOS 12/24/2009   MORBID OBESITY 06/14/2009   COLD SORE 10/16/2008   SKIN TAG 06/18/2008   Essential hypertension 03/17/2007   Past Medical History:  Diagnosis Date    Breast cancer (HCC)    Complication of anesthesia    Family history of breast cancer    Heart murmur    no problems, saw cardiologist 2010   Hypertension    on meds   Osteoarthritis    Personal history of chemotherapy    Personal history of radiation therapy    left   Plantar fasciitis    PONV (postoperative nausea and vomiting)    Thyroid  disease    Hypothyroidism   Past Surgical History:  Procedure Laterality Date   ABDOMINAL HYSTERECTOMY  02/2012   AXILLARY LYMPH NODE DISSECTION Left 11/09/2018   Procedure: LEFT AXILLARY LYMPH NODE DISSECTION;  Surgeon: Sim Dryer, MD;  Location: [facility];  Service: General;  Laterality: Left;   BREAST BIOPSY Left    malignant   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/11/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   BREAST LUMPECTOMY WITH RADIOACTIVE SEED AND SENTINEL LYMPH NODE BIOPSY Left 10/11/2018   Procedure: LEFT BREAST LUMPECTOMY WITH RADIOACTIVE SEED AND LEFT SENTINEL LYMPH NODE MAPPING WITH LEFT TARGETED LYMPH NODE BIOPSY;  Surgeon: Sim Dryer, MD;  Location: [facility];  Service: General;  Laterality: Left;   BRONCHIAL NEEDLE ASPIRATION BIOPSY  05/13/2023   Procedure: BRONCHIAL NEEDLE ASPIRATION BIOPSIES;  Surgeon: Josiah Nigh, MD;  Location: [facility];  Service: Pulmonary;;   CATARACT EXTRACTION Bilateral 11/3  06/26/20   shipiro    FOOT SURGERY Left    bone spurs   LUMBAR LAMINECTOMY  1990   PORT-A-CATH  REMOVAL N/A 05/23/2019   Procedure: PORT REMOVAL;  Surgeon: Sim Dryer, MD;  Location: [facility];  Service: General;  Laterality: N/A;   PORTACATH PLACEMENT Right 12/01/2018   Procedure: INSERTION PORT-A-CATH WITH ULTRASOUND;  Surgeon: Sim Dryer, MD;  Location: [facility];  Service: General;  Laterality: Right;   VIDEO BRONCHOSCOPY WITH ENDOBRONCHIAL ULTRASOUND Bilateral 05/13/2023   Procedure: VIDEO BRONCHOSCOPY WITH ENDOBRONCHIAL ULTRASOUND;  Surgeon: Josiah Nigh, MD;  Location: [facility];  Service: Pulmonary;  Laterality: Bilateral;   Social History   Tobacco Use   Smoking status: Never   Smokeless tobacco: Never  Vaping Use   Vaping status: Never Used  Substance Use Topics   Alcohol use: Not Currently    Comment: occassionally   Drug use: Never   Social History   Socioeconomic History   Marital status: Single    Spouse name: Not on file   Number of children: Not on file   Years of education: Not on file   Highest education level: Master's degree (e.g., MA, MS, MEng, MEd, MSW, MBA)  Occupational History   Occupation: [employer]  Tobacco Use   Smoking status: Never   Smokeless tobacco: Never  Vaping Use   Vaping status: Never Used  Substance and Sexual Activity   Alcohol use: Not Currently    Comment: occassionally   Drug use: Never   Sexual activity: Not Currently    Partners: Male  Other Topics Concern   Not on file  Social History Narrative   Exercise-- no   Social Drivers of Health   Financial Resource Strain: Low Risk  (01/04/2024)   Overall Financial Resource Strain (CARDIA)    Difficulty of Paying Living Expenses: Not very hard  Food Insecurity: No Food Insecurity (01/04/2024)   Hunger Vital Sign    Worried About Running Out of Food in the Last Year: Never true    Ran Out of Food in the Last Year: Never true  Transportation Needs: No Transportation Needs (01/04/2024)   PRAPARE - Administrator, Civil Service (Medical): No    Lack of Transportation (Non-Medical): No  Physical Activity: Unknown (01/04/2024)   Exercise Vital Sign    Days of Exercise per Week: 0 days    Minutes of Exercise per Session: Not on file  Stress: Stress Concern Present (01/04/2024)   [employer] of Occupational Health - Occupational Stress Questionnaire    Feeling of Stress : To some extent  Social Connections: Moderately Integrated (01/04/2024)   Social Connection and Isolation Panel [NHANES]     Frequency of Communication with Friends and Family: Once a week    Frequency of Social Gatherings with Friends and Family: More than three times a week    Attends Religious Services: More than 4 times per year    Active Member of [employer] or Organizations: Yes    Attends Engineer, structural: More than 4 times per year    Marital Status: Never married  Catering manager Violence: Not on file   Family Status  Relation Name Status   Mother  Alive   Father  Deceased   Sister  Alive   Sister  Alive   Mat Aunt  Deceased   Other MGaung Alive  No partnership data on file   Family History  Problem Relation Age of Onset   Diabetes Mother    Hyperlipidemia Mother    Hypertension Mother    Hypertension Father  Heart disease Father 60       ?MI   Hypothyroidism Sister    Hyperthyroidism Sister    Breast cancer Maternal Aunt        dx 60's/70's   Breast cancer Other        dx early 50's   Allergies  Allergen Reactions   Gadolinium Derivatives Anaphylaxis    MRI gadolinium contrast anaphylaxis with hives, facial swelling, wheezing, SOB, hypoxia   Erythromycin Swelling    Lip swelling; angioedema   Lisinopril Swelling    REACTION: angioedema   Penicillins Hives and Swelling    Swelling in arms & hands   Adhesive [Tape] Rash      Review of Systems  Constitutional:  Negative for fever and malaise/fatigue.  HENT:  Negative for congestion.   Eyes:  Negative for blurred vision.  Respiratory:  Negative for cough and shortness of breath.   Cardiovascular:  Negative for chest pain, palpitations and leg swelling.  Gastrointestinal:  Negative for vomiting.  Musculoskeletal:  Negative for back pain.  Skin:  Negative for rash.  Neurological:  Negative for loss of consciousness and headaches.      Objective:     BP 122/69 Comment: pt BP monitor from home.  Pulse 65   Temp 98.1 F (36.7 C) (Oral)   Ht 5\' 6"  (1.676 m)   Wt 227 lb 12.8 oz (103.3 kg)   LMP 01/28/2012    SpO2 98%   BMI 36.77 kg/m  BP Readings from Last 3 Encounters:  01/25/24 122/69  01/04/24 (!) 160/80  11/02/23 124/63   Wt Readings from Last 3 Encounters:  01/25/24 227 lb 12.8 oz (103.3 kg)  01/04/24 229 lb 6.4 oz (104.1 kg)  11/02/23 227 lb 1.6 oz (103 kg)   SpO2 Readings from Last 3 Encounters:  01/25/24 98%  01/04/24 99%  11/02/23 100%      Physical Exam Vitals and nursing note reviewed.  Constitutional:      General: She is not in acute distress.    Appearance: Normal appearance. She is well-developed.  HENT:     Head: Normocephalic and atraumatic.  Eyes:     General: No scleral icterus.       Right eye: No discharge.        Left eye: No discharge.  Cardiovascular:     Rate and Rhythm: Normal rate and regular rhythm.     Heart sounds: No murmur heard. Pulmonary:     Effort: Pulmonary effort is normal. No respiratory distress.     Breath sounds: Normal breath sounds.  Musculoskeletal:        General: Normal range of motion.     Cervical back: Normal range of motion and neck supple.     Right lower leg: No edema.     Left lower leg: No edema.  Skin:    General: Skin is warm and dry.  Neurological:     Mental Status: She is alert and oriented to person, place, and time.  Psychiatric:        Mood and Affect: Mood normal.        Behavior: Behavior normal.        Thought Content: Thought content normal.        Judgment: Judgment normal.      No results found for any visits on 01/25/24.  Last CBC Lab Results  Component Value Date   WBC 5.1 01/04/2024   HGB 11.7 (L) 01/04/2024   HCT 35.4 (L) 01/04/2024  MCV 94.7 01/04/2024   MCH 30.6 11/02/2023   RDW 16.0 (H) 01/04/2024   PLT 241.0 01/04/2024   Last metabolic panel Lab Results  Component Value Date   GLUCOSE 99 01/04/2024   NA 139 01/04/2024   K 4.1 01/04/2024   CL 104 01/04/2024   CO2 26 01/04/2024   BUN 11 01/04/2024   CREATININE 0.72 01/04/2024   GFR 87.43 01/04/2024   CALCIUM 8.7  01/04/2024   PROT 7.3 01/04/2024   ALBUMIN 4.0 01/04/2024   BILITOT 0.8 01/04/2024   ALKPHOS 61 01/04/2024   AST 16 01/04/2024   ALT 9 01/04/2024   ANIONGAP 4 (L) 11/02/2023   Last lipids Lab Results  Component Value Date   CHOL 229 (H) 01/04/2024   HDL 80.50 01/04/2024   LDLCALC 134 (H) 01/04/2024   TRIG 71.0 01/04/2024   CHOLHDL 3 01/04/2024   Last hemoglobin A1c Lab Results  Component Value Date   HGBA1C 6.1 02/03/2022   Last thyroid  functions Lab Results  Component Value Date   TSH 2.55 01/04/2024   Last vitamin D No results found for: "25OHVITD2", "25OHVITD3", "VD25OH" Last vitamin B12 and Folate No results found for: "VITAMINB12", "FOLATE"    The 10-year ASCVD risk score (Arnett DK, et al., 2019) is: 8.5%    Assessment & Plan:   Problem List Items Addressed This Visit       Unprioritized   Essential hypertension - Primary   Well controlled, no changes to meds. Encouraged heart healthy diet such as the DASH diet and exercise as tolerated.       Assessment and Plan Assessment & Plan Hypertension   Blood pressure readings over the past week are 156/82, 111/64, and 145/85, showing improvement but not yet at target levels. She reports no symptoms such as chest pain, dyspnea, or peripheral edema. Continue current antihypertensive regimen. Monitor blood pressure regularly at home. Schedule follow-up appointment in November for further evaluation.  Colorectal Cancer Screening   She has not undergone a colonoscopy and previously missed a scheduling attempt. She expresses reluctance but acknowledges the importance of screening. Provide contact information for [facility] for colonoscopy scheduling when ready.     Return in about 5 months (around 06/26/2024), or if symptoms worsen or fail to improve, for annual exam, fasting.    Sari Cogan R Lowne Chase, DO

## 2024-01-26 ENCOUNTER — Other Ambulatory Visit: Payer: Self-pay

## 2024-01-28 ENCOUNTER — Other Ambulatory Visit: Payer: Self-pay | Admitting: Pharmacy Technician

## 2024-01-28 ENCOUNTER — Other Ambulatory Visit: Payer: Self-pay

## 2024-01-28 NOTE — Progress Notes (Signed)
 Specialty Pharmacy Refill Coordination Note  Kayla Banks is a 68 y.o. female contacted today regarding refills of specialty medication(s) Abemaciclib  (VERZENIO )   Patient requested Delivery   Delivery date: 01/31/24   Verified address: [address]  [place]   Medication will be filled on 01/28/24.

## 2024-01-31 ENCOUNTER — Encounter: Payer: Self-pay | Admitting: Hematology and Oncology

## 2024-02-07 ENCOUNTER — Inpatient Hospital Stay

## 2024-02-07 ENCOUNTER — Inpatient Hospital Stay: Admitting: Pharmacist

## 2024-02-10 ENCOUNTER — Encounter: Payer: Self-pay | Admitting: Family Medicine

## 2024-02-23 NOTE — Progress Notes (Unsigned)
 [facility]       Telephone: [phone]?Fax: [phone]   Oncology Clinical Pharmacist Practitioner Progress Note  Kayla Banks was contacted via in-person to discuss her chemotherapy regimen for abemaciclib  which they receive under the care of Dr. Vinay Gudena. Today she presents to clinic alone.    Current treatment regimen and start date Abemaciclib  (05/10/23) 50 mg BID (06/22/23) 100 mg BID (05/10/23-06/10/23) Letrozole  (05/04/23) Denosumab  120 mg (05/17/23)   Interval History She continues on abemaciclib  50 mg by mouth BID. Abemaciclib  is given in combination with letrozole  and denosumab . Therapy is planned to continue until disease progression or unacceptable toxicity. She last saw Dr. Gudena on 11/02/23 and clinical pharmacy on 10/12/23. Dr. Odean is planning restaging scans on 05/04/24 and he will see her again with labs on 05/15/24. She is due for denosumab  120 mg today and then again on 05/15/24  Response to Therapy Doing well. Continues to tolerate lower dose of abemaciclib  much better. Labs are stable. She is due for denosumab  120 mg today. BP a little elevated today but asymptomatic.  Labs, vitals, treatment parameters, and manufacturer guidelines assessing toxicity were reviewed with Kayla Banks today. Based on these values, patient is in agreement to continue abemaciclib  therapy at this time.  Allergies Allergies  Allergen Reactions   Gadolinium Derivatives Anaphylaxis    MRI gadolinium contrast anaphylaxis with hives, facial swelling, wheezing, SOB, hypoxia   Erythromycin Swelling    Lip swelling; angioedema   Lisinopril Swelling    REACTION: angioedema   Penicillins Hives and Swelling    Swelling in arms & hands   Adhesive [Tape] Rash    Vitals    02/24/2024   10:59 AM 02/24/2024   10:53 AM 01/25/2024   10:29 AM  Oncology Vitals  Height  168 cm   Weight  103.103 kg   Weight (lbs)  227 lbs 5 oz   BMI  36.69 kg/m2   Temp  98.2 F (36.8  C)   Pulse Rate 57 58   BP 141/74 140/51 122/69  Resp  16   SpO2  100 %   BSA (m2)  2.19 m2     Laboratory Data    Latest Ref Rng & Units 02/24/2024    9:53 AM 01/04/2024   10:41 AM 11/02/2023   11:20 AM  CBC EXTENDED  WBC 4.0 - 10.5 K/uL 6.0  5.1  5.0   RBC 3.87 - 5.11 MIL/uL 3.69  3.74  3.53   Hemoglobin 12.0 - 15.0 g/dL 88.5  88.2  89.1   HCT 36.0 - 46.0 % 35.0  35.4  34.0   Platelets 150 - 400 K/uL 211  241.0  211   NEUT# 1.7 - 7.7 K/uL 4.3  3.9  3.8   Lymph# 0.7 - 4.0 K/uL 1.0  0.6  0.6        Latest Ref Rng & Units 02/24/2024    9:53 AM 01/04/2024   10:41 AM 11/02/2023   11:20 AM  CMP  Glucose 70 - 99 mg/dL 897  99  897   BUN 8 - 23 mg/dL 21  11  13    Creatinine 0.44 - 1.00 mg/dL 9.03  9.27  9.11   Sodium 135 - 145 mmol/L 143  139  141   Potassium 3.5 - 5.1 mmol/L 4.6  4.1  4.8   Chloride 98 - 111 mmol/L 106  104  109   CO2 22 - 32 mmol/L 32  26  28  Calcium 8.9 - 10.3 mg/dL 9.6  8.7  8.5   Total Protein 6.5 - 8.1 g/dL 6.9  7.3  6.6   Total Bilirubin 0.0 - 1.2 mg/dL 0.8  0.8  0.8   Alkaline Phos 38 - 126 U/L 55  61  72   AST 15 - 41 U/L 13  16  14    ALT 0 - 44 U/L 8  9  9      Adverse Effects Assessment Calcium has normalized since starting vitamin d/calcium supplement  Adherence Assessment Kayla Banks reports missing 2 doses over the past 12 weeks.  The PM dose when she was traveling Reason for missed dose: forgot Patient was re-educated on importance of adherence.   Access Assessment Kayla Banks is currently receiving her abemaciclib  through [facility] concerns:  none  Medication Reconciliation The patient's medication list was reviewed today with the patient? Yes New medications or herbal supplements have recently been started? No  Any medications have been discontinued? No  The medication list was updated and reconciled based on the patient's most recent medication list in the electronic medical record (EMR)  including herbal products and OTC medications.   Medications Current Outpatient Medications  Medication Sig Dispense Refill   abemaciclib  (VERZENIO ) 50 MG tablet Take 1 tablet (50 mg total) by mouth 2 (two) times daily. Swallow tablets whole. Do not chew, crush, or split tablets before swallowing. 56 tablet 5   amLODipine  (NORVASC ) 5 MG tablet Take 1 tablet (5 mg total) by mouth daily. Needs ov before any more refills 90 tablet 1   Calcium Carbonate-Vitamin D (CALCIUM-VITAMIN D PO) Take by mouth.     carvedilol  (COREG ) 25 MG tablet Take 1 tablet (25 mg total) by mouth 2 (two) times daily with a meal. Due for appt 03/2023 180 tablet 1   gabapentin  (NEURONTIN ) 300 MG capsule Take 1 capsule (300 mg total) by mouth 2 (two) times daily. 60 capsule 1   letrozole  (FEMARA ) 2.5 MG tablet Take 1 tablet (2.5 mg total) by mouth daily. 90 tablet 3   meloxicam  (MOBIC ) 15 MG tablet Take 1 tablet (15 mg total) by mouth daily. 90 tablet 1   SYNTHROID  100 MCG tablet Take 1 tablet (100 mcg total) by mouth daily before breakfast. 90 tablet 1   No current facility-administered medications for this visit.   Facility-Administered Medications Ordered in Other Visits  Medication Dose Route Frequency Provider Last Rate Last Admin   denosumab  (XGEVA ) injection 120 mg  120 mg Subcutaneous Once Gudena, Vinay, MD        Drug-Drug Interactions (DDIs) DDIs were evaluated? Yes Significant DDIs? No  The patient was instructed to speak with their health care provider and/or the oral chemotherapy pharmacist before starting any new drug, including prescription or over the counter, natural / herbal products, or vitamins.  Supportive Care Diarrhea: we reviewed that diarrhea is common with abemaciclib  and confirmed that she does have loperamide (Imodium) at home.  We reviewed how to take this medication PRN. Neutropenia: we discussed the importance of having a thermometer and what the Centers for Disease Control and Prevention  (CDC) considers a fever which is 100.75F (38C) or higher.  Gave patient 24/7 triage line to call if any fevers or symptoms. ILD/Pneumonitis: we reviewed potential symptoms including cough, shortness, and fatigue.  VTE: reviewed signs of DVT such as leg swelling, redness, pain, or tenderness and signs of PE such as shortness of breath, rapid or irregular heartbeat, cough, chest  pain, or lightheadedness. Reviewed to take the medication every 12 hours (with food sometimes can be easier on the stomach) and to take it at the same time every day. Hepatotoxicity WNL Drug interactions with grapefruit products  Dosing Assessment Hepatic adjustments needed? No  Renal adjustments needed? No  Toxicity adjustments needed? No  The current dosing regimen is appropriate to continue at this time.  Follow-Up Plan Continue abemaciclib  50 mg by mouth every 12 hours.  Continue letrozole  2.5 mg by mouth daily Continue denosumab  120 mg SubQ every 12 weeks. Due today, then again on 05/15/24 Continue calcium/vitamin D Restaging scans ordered by Dr. Gudena for expected date of 05/04/24 Labs, Dr. Odean visit, review scans, and denosumab  120 mg scheduled for 05/15/24 Kayla Banks can follow up with clinical pharmacy as deemed necessary by Dr. Mackey Odean going forward   Kayla Banks participated in the discussion, expressed understanding, and voiced agreement with the above plan. All questions were answered to her satisfaction. The patient was advised to contact the clinic at [phone] with any questions or concerns prior to her return visit.   I spent 30 minutes assessing and educating the patient.  Kanai Hilger A. Lucila, PharmD, BCOP, CPP  Norleen DELENA Lucila, RPH-CPP, 02/24/2024  11:07 AM   **Disclaimer: This note was dictated with voice recognition software. Similar sounding words can inadvertently be transcribed and this note may contain transcription errors which may not have been corrected upon  publication of note.**

## 2024-02-24 ENCOUNTER — Inpatient Hospital Stay

## 2024-02-24 ENCOUNTER — Inpatient Hospital Stay: Admitting: Pharmacist

## 2024-02-24 ENCOUNTER — Inpatient Hospital Stay: Attending: Hematology and Oncology

## 2024-02-24 VITALS — BP 141/74 | HR 57 | Temp 98.2°F | Resp 16 | Ht 66.0 in | Wt 227.3 lb

## 2024-02-24 DIAGNOSIS — Z170 Estrogen receptor positive status [ER+]: Secondary | ICD-10-CM | POA: Diagnosis not present

## 2024-02-24 DIAGNOSIS — Z79811 Long term (current) use of aromatase inhibitors: Secondary | ICD-10-CM | POA: Diagnosis not present

## 2024-02-24 DIAGNOSIS — Z1732 Human epidermal growth factor receptor 2 negative status: Secondary | ICD-10-CM | POA: Diagnosis not present

## 2024-02-24 DIAGNOSIS — C7951 Secondary malignant neoplasm of bone: Secondary | ICD-10-CM | POA: Insufficient documentation

## 2024-02-24 DIAGNOSIS — C50412 Malignant neoplasm of upper-outer quadrant of left female breast: Secondary | ICD-10-CM

## 2024-02-24 DIAGNOSIS — Z95828 Presence of other vascular implants and grafts: Secondary | ICD-10-CM

## 2024-02-24 DIAGNOSIS — Z1721 Progesterone receptor positive status: Secondary | ICD-10-CM | POA: Insufficient documentation

## 2024-02-24 LAB — CBC WITH DIFFERENTIAL (CANCER CENTER ONLY)
Abs Immature Granulocytes: 0.03 K/uL (ref 0.00–0.07)
Basophils Absolute: 0.1 K/uL (ref 0.0–0.1)
Basophils Relative: 1 %
Eosinophils Absolute: 0.1 K/uL (ref 0.0–0.5)
Eosinophils Relative: 2 %
HCT: 35 % — ABNORMAL LOW (ref 36.0–46.0)
Hemoglobin: 11.4 g/dL — ABNORMAL LOW (ref 12.0–15.0)
Immature Granulocytes: 1 %
Lymphocytes Relative: 16 %
Lymphs Abs: 1 K/uL (ref 0.7–4.0)
MCH: 30.9 pg (ref 26.0–34.0)
MCHC: 32.6 g/dL (ref 30.0–36.0)
MCV: 94.9 fL (ref 80.0–100.0)
Monocytes Absolute: 0.6 K/uL (ref 0.1–1.0)
Monocytes Relative: 9 %
Neutro Abs: 4.3 K/uL (ref 1.7–7.7)
Neutrophils Relative %: 71 %
Platelet Count: 211 K/uL (ref 150–400)
RBC: 3.69 MIL/uL — ABNORMAL LOW (ref 3.87–5.11)
RDW: 15.3 % (ref 11.5–15.5)
WBC Count: 6 K/uL (ref 4.0–10.5)
nRBC: 0 % (ref 0.0–0.2)

## 2024-02-24 LAB — CMP (CANCER CENTER ONLY)
ALT: 8 U/L (ref 0–44)
AST: 13 U/L — ABNORMAL LOW (ref 15–41)
Albumin: 3.7 g/dL (ref 3.5–5.0)
Alkaline Phosphatase: 55 U/L (ref 38–126)
Anion gap: 5 (ref 5–15)
BUN: 21 mg/dL (ref 8–23)
CO2: 32 mmol/L (ref 22–32)
Calcium: 9.6 mg/dL (ref 8.9–10.3)
Chloride: 106 mmol/L (ref 98–111)
Creatinine: 0.96 mg/dL (ref 0.44–1.00)
GFR, Estimated: >60 mL/min (ref >60)
Glucose, Bld: 102 mg/dL — ABNORMAL HIGH (ref 70–99)
Potassium: 4.6 mmol/L (ref 3.5–5.1)
Sodium: 143 mmol/L (ref 135–145)
Total Bilirubin: 0.8 mg/dL (ref 0.0–1.2)
Total Protein: 6.9 g/dL (ref 6.5–8.1)

## 2024-02-24 MED ORDER — DENOSUMAB 120 MG/1.7ML ~~LOC~~ SOLN
120.0000 mg | Freq: Once | SUBCUTANEOUS | Status: AC
  Administered 2024-02-24: 120 mg via SUBCUTANEOUS
  Filled 2024-02-24: qty 1.7

## 2024-02-28 ENCOUNTER — Other Ambulatory Visit: Payer: Self-pay | Admitting: Hematology and Oncology

## 2024-02-28 ENCOUNTER — Other Ambulatory Visit: Payer: Self-pay

## 2024-02-29 ENCOUNTER — Other Ambulatory Visit: Payer: Self-pay

## 2024-02-29 MED ORDER — ABEMACICLIB 50 MG PO TABS
50.0000 mg | ORAL_TABLET | Freq: Two times a day (BID) | ORAL | 5 refills | Status: AC
  Filled 2024-02-29 – 2024-03-01 (×2): qty 56, 28d supply, fill #0
  Filled 2024-04-11: qty 56, 28d supply, fill #1
  Filled 2024-05-09 – 2024-06-07 (×3): qty 56, 28d supply, fill #2
  Filled 2024-06-29 – 2024-07-27 (×2): qty 56, 28d supply, fill #3
  Filled 2024-08-18 – 2024-08-22 (×6): qty 56, 28d supply, fill #4
  Filled 2024-09-12: qty 56, 28d supply, fill #5

## 2024-02-29 NOTE — Telephone Encounter (Signed)
 Kayla Banks.  John usually fills these, can you please refill while he is out.  Thanks!

## 2024-03-01 ENCOUNTER — Other Ambulatory Visit: Payer: Self-pay

## 2024-03-01 NOTE — Progress Notes (Signed)
 Specialty Pharmacy Refill Coordination Note  Kayla Banks is a 68 y.o. female contacted today regarding refills of specialty medication(s) Abemaciclib  (VERZENIO )   Patient requested Delivery   Delivery date: 03/07/24   Verified address: [address]  [place]   Medication will be filled on 03/06/24.

## 2024-03-06 ENCOUNTER — Other Ambulatory Visit: Payer: Self-pay

## 2024-03-28 ENCOUNTER — Other Ambulatory Visit: Payer: Self-pay

## 2024-03-28 NOTE — Progress Notes (Signed)
 Specialty Pharmacy Ongoing Clinical Assessment Note  Kayla Banks is a 68 y.o. female who is being followed by the specialty pharmacy service for RxSp Oncology   Patient's specialty medication(s) reviewed today: Abemaciclib  (VERZENIO )   Missed doses in the last 4 weeks: No data recorded  Patient/Caregiver did not have any additional questions or concerns.   Therapeutic benefit summary: Patient is achieving benefit   Adverse events/side effects summary: No adverse events/side effects   Patient's therapy is appropriate to: Continue    Goals Addressed             This Visit's Progress    Slow Disease Progression   On track    Patient is on track. Patient will maintain adherence. Last CT from 10/28/23 showed stable metastatic disease with no signs of progression.          Follow up: 6 months  Silvano LOISE Dolly Specialty Pharmacist

## 2024-04-10 ENCOUNTER — Encounter (INDEPENDENT_AMBULATORY_CARE_PROVIDER_SITE_OTHER): Payer: Self-pay

## 2024-04-11 ENCOUNTER — Other Ambulatory Visit: Payer: Self-pay

## 2024-04-11 NOTE — Progress Notes (Signed)
 Specialty Pharmacy Refill Coordination Note  Kayla Banks is a 68 y.o. female contacted today regarding refills of specialty medication(s) Abemaciclib  (VERZENIO )   Patient requested (Patient-Rptd) Delivery   Delivery date: 04/18/24   Verified address: (Patient-Rptd) [address], [place] [address]   Medication will be filled on 04/14/24.

## 2024-04-12 ENCOUNTER — Other Ambulatory Visit: Payer: Self-pay

## 2024-04-13 ENCOUNTER — Other Ambulatory Visit: Payer: Self-pay

## 2024-05-04 ENCOUNTER — Ambulatory Visit (HOSPITAL_COMMUNITY)
Admission: RE | Admit: 2024-05-04 | Discharge: 2024-05-04 | Disposition: A | Discharge status: Home / Self Care | Source: Ambulatory Visit | Attending: Hematology and Oncology | Admitting: Hematology and Oncology

## 2024-05-04 DIAGNOSIS — C50412 Malignant neoplasm of upper-outer quadrant of left female breast: Secondary | ICD-10-CM | POA: Insufficient documentation

## 2024-05-04 DIAGNOSIS — Z170 Estrogen receptor positive status [ER+]: Secondary | ICD-10-CM | POA: Insufficient documentation

## 2024-05-04 MED ORDER — IOHEXOL 300 MG/ML  SOLN
100.0000 mL | Freq: Once | INTRAMUSCULAR | Status: AC | PRN
  Administered 2024-05-04: 100 mL via INTRAVENOUS

## 2024-05-05 ENCOUNTER — Telehealth: Payer: Self-pay

## 2024-05-05 NOTE — Telephone Encounter (Signed)
 Oral Oncology Patient Advocate Encounter   Received notification that prior authorization for Verzenio  is due for renewal.   PA submitted on 05/05/24 Key [id number] Status is pending      Charlott Hamilton,  CPhT-Adv  she/her/hers [facility]  [facility] Pharmacy Technician Patient Advocate Specialist III [facility] Phone: [phone]  Fax: [phone] [email or web address]

## 2024-05-05 NOTE — Telephone Encounter (Signed)
 Oral Oncology Patient Advocate Encounter  Prior Authorization Renewal for Verzenio  has been approved.    PA# [id number] Effective dates: 05/05/24 through 05/05/25     Charlott Hamilton,  CPhT-Adv  she/her/hers [place]  [facility] Pharmacy Technician Patient Advocate Specialist III [facility] Phone: [phone]  Fax: [phone] [email or web address]

## 2024-05-09 ENCOUNTER — Other Ambulatory Visit: Payer: Self-pay

## 2024-05-11 ENCOUNTER — Other Ambulatory Visit (HOSPITAL_COMMUNITY): Payer: Self-pay

## 2024-05-15 ENCOUNTER — Inpatient Hospital Stay

## 2024-05-15 ENCOUNTER — Inpatient Hospital Stay: Attending: Hematology and Oncology | Admitting: Hematology and Oncology

## 2024-05-15 ENCOUNTER — Other Ambulatory Visit: Payer: Self-pay

## 2024-05-15 VITALS — BP 139/62 | HR 63 | Temp 97.6°F | Resp 17 | Ht 66.0 in | Wt 234.5 lb

## 2024-05-15 DIAGNOSIS — Z17411 Hormone receptor positive with human epidermal growth factor receptor 2 negative status: Secondary | ICD-10-CM | POA: Diagnosis not present

## 2024-05-15 DIAGNOSIS — C50412 Malignant neoplasm of upper-outer quadrant of left female breast: Secondary | ICD-10-CM | POA: Insufficient documentation

## 2024-05-15 DIAGNOSIS — Z170 Estrogen receptor positive status [ER+]: Secondary | ICD-10-CM | POA: Insufficient documentation

## 2024-05-15 DIAGNOSIS — C7951 Secondary malignant neoplasm of bone: Secondary | ICD-10-CM | POA: Diagnosis present

## 2024-05-15 DIAGNOSIS — Z79899 Other long term (current) drug therapy: Secondary | ICD-10-CM | POA: Insufficient documentation

## 2024-05-15 DIAGNOSIS — Z1721 Progesterone receptor positive status: Secondary | ICD-10-CM | POA: Insufficient documentation

## 2024-05-15 DIAGNOSIS — D649 Anemia, unspecified: Secondary | ICD-10-CM | POA: Diagnosis not present

## 2024-05-15 DIAGNOSIS — Z79811 Long term (current) use of aromatase inhibitors: Secondary | ICD-10-CM | POA: Diagnosis not present

## 2024-05-15 LAB — CBC WITH DIFFERENTIAL (CANCER CENTER ONLY)
Abs Immature Granulocytes: 0.03 K/uL (ref 0.00–0.07)
Basophils Absolute: 0 K/uL (ref 0.0–0.1)
Basophils Relative: 1 %
Eosinophils Absolute: 0.1 K/uL (ref 0.0–0.5)
Eosinophils Relative: 1 %
HCT: 33.2 % — ABNORMAL LOW (ref 36.0–46.0)
Hemoglobin: 10.8 g/dL — ABNORMAL LOW (ref 12.0–15.0)
Immature Granulocytes: 1 %
Lymphocytes Relative: 16 %
Lymphs Abs: 0.8 K/uL (ref 0.7–4.0)
MCH: 32 pg (ref 26.0–34.0)
MCHC: 32.5 g/dL (ref 30.0–36.0)
MCV: 98.5 fL (ref 80.0–100.0)
Monocytes Absolute: 0.4 K/uL (ref 0.1–1.0)
Monocytes Relative: 9 %
Neutro Abs: 3.5 K/uL (ref 1.7–7.7)
Neutrophils Relative %: 72 %
Platelet Count: 175 K/uL (ref 150–400)
RBC: 3.37 MIL/uL — ABNORMAL LOW (ref 3.87–5.11)
RDW: 15 % (ref 11.5–15.5)
WBC Count: 4.9 K/uL (ref 4.0–10.5)
nRBC: 0 % (ref 0.0–0.2)

## 2024-05-15 LAB — CMP (CANCER CENTER ONLY)
ALT: 8 U/L (ref 0–44)
AST: 13 U/L — ABNORMAL LOW (ref 15–41)
Albumin: 3.7 g/dL (ref 3.5–5.0)
Alkaline Phosphatase: 56 U/L (ref 38–126)
Anion gap: 4 — ABNORMAL LOW (ref 5–15)
BUN: 15 mg/dL (ref 8–23)
CO2: 30 mmol/L (ref 22–32)
Calcium: 8.6 mg/dL — ABNORMAL LOW (ref 8.9–10.3)
Chloride: 106 mmol/L (ref 98–111)
Creatinine: 0.96 mg/dL (ref 0.44–1.00)
GFR, Estimated: >60 mL/min (ref >60)
Glucose, Bld: 92 mg/dL (ref 70–99)
Potassium: 4 mmol/L (ref 3.5–5.1)
Sodium: 140 mmol/L (ref 135–145)
Total Bilirubin: 0.8 mg/dL (ref 0.0–1.2)
Total Protein: 6.7 g/dL (ref 6.5–8.1)

## 2024-05-15 NOTE — Progress Notes (Signed)
 Patient Care Team: Antonio Meth, Jamee SAUNDERS, DO as PCP - General Yvone Rush, MD as Consulting Physician (Orthopedic Surgery) Vanderbilt Ned, MD as Consulting Physician (General Surgery) Odean Potts, MD as Consulting Physician (Hematology and Oncology) Dewey Rush, MD as Consulting Physician (Radiation Oncology) Roz Anes, MD as Consulting Physician (Ophthalmology) Lucila Rush LABOR, RPH-CPP as Pharmacist (Hematology and Oncology) Pickenpack-Cousar, Fannie SAILOR, NP as Nurse Practitioner (Hospice and Palliative Medicine)  DIAGNOSIS:  Encounter Diagnosis  Name Primary?   Malignant neoplasm of upper-outer quadrant of left breast in female, estrogen receptor positive (HCC) Yes    SUMMARY OF ONCOLOGIC HISTORY: Oncology History  Malignant neoplasm of upper-outer quadrant of left breast in female, estrogen receptor positive (HCC)  08/24/2018 Initial Biopsy   Screening detected left breast mass at 2:30 position 6 mm in size with one abnormal lymph node and stable benign calcifications, biopsy of the mass grade 2 IDC with DCIS and lymphovascular invasion, lymph node biopsy is positive, ER 95%, PR 40%, Ki-67 20%, HER-2 -1+ by IHC, T1b N1 stage Ib   08/31/2018 Cancer Staging   Staging form: Breast, AJCC 8th Edition - Clinical: Stage IB (cT1b, cN1(f), cM0, G2, ER+, PR+, HER2-) - Signed by Odean Potts, MD on 08/31/2018   09/21/2018 Genetic Testing   Genetic testing performed through [facility]'s Common Hereditary Cancers Panel reported out on 09/19/2018 showed no pathogenic mutations.  The Common Hereditary Cancer Panel offered by [facility] includes sequencing and/or deletion duplication testing of the following 53 genes: APC, ATM, AXIN2, BARD1, BMPR1A, BRCA1, BRCA2, BRIP1, BUB1B, CDH1, CDK4, CDKN2A, CHEK2, CTNNA1, DICER1, ENG, EPCAM, GALNT12, GREM1, HOXB13, KIT, MEN1, MLH1, MLH3, MSH2, MSH3, MSH6, MUTYH, NBN, NF1, NTHL1, PALB2, PDGFRA, PMS2, POLD1, POLE, PTEN, RAD50, RAD51C, RAD51D, RNF43, RPS20, SDHA,  SDHB, SDHC, SDHD, SMAD4, SMARCA4, STK11, TP53, TSC1, TSC2, VHL.   A variant of uncertain significance (VUS) in a gene called NBN was also noted. c.1979G>C (p.Arg660Thr)   10/18/2018 Cancer Staging   Staging form: Breast, AJCC 8th Edition - Pathologic stage from 10/18/2018: Stage IB (pT1b, pN2a, cM0, G2, ER+, PR+, HER2-) - Signed by Odean Potts, MD on 11/14/2018   11/09/2018 Surgery   Left lumpectomy ([facility]) [phone]): Grade 2 IDC with DCIS, 1 cm, margins negative, DCIS focally 0.1 cm from superior margin, 2/2 lymph nodes positive, ER 95%, PR 45%, HER-2 negative, Ki-67 20% AXLND: 3/20 LN Pos (total 5/22)   12/15/2018 -  Chemotherapy   DOXOrubicin  (ADRIAMYCIN ) chemo injection 142 mg, 60 mg/m2 = 142 mg, Intravenous,  Once, 4 of 4 cycles. Administration: 142 mg (12/15/2018), 142 mg (12/29/2018), 142 mg (01/12/2019), 142 mg (01/26/2019)  palonosetron  (ALOXI ) injection 0.25 mg, 0.25 mg, Intravenous,  Once, 4 of 4 cycles. Administration: 0.25 mg (12/15/2018), 0.25 mg (12/29/2018), 0.25 mg (01/12/2019), 0.25 mg (01/26/2019)  pegfilgrastim -cbqv (UDENYCA ) injection 6 mg, 6 mg, Subcutaneous, Once, 4 of 4 cycles. Administration: 6 mg (12/17/2018), 6 mg (12/31/2018), 6 mg (01/14/2019), 6 mg (01/28/2019)  cyclophosphamide  (CYTOXAN ) 1,420 mg in sodium chloride  0.9 % 250 mL chemo infusion, 600 mg/m2 = 1,420 mg, Intravenous,  Once, 4 of 4 cycles. Administration: 1,420 mg (12/15/2018), 1,420 mg (12/29/2018), 1,420 mg (01/12/2019), 1,420 mg (01/26/2019)  PACLitaxel  (TAXOL ) 192 mg in sodium chloride  0.9 % 250 mL chemo infusion (</= 80mg /m2), 80 mg/m2 = 192 mg, Intravenous,  Once, 11 of 12 cycles. Dose modification: 60 mg/m2 (original dose 80 mg/m2, Cycle 12, Reason: Dose not tolerated). Administration: 192 mg (02/09/2019), 192 mg (02/16/2019), 192 mg (02/23/2019), 192 mg (03/02/2019), 192 mg (03/09/2019), 192 mg (  03/16/2019), 192 mg (03/23/2019), 144 mg (03/30/2019), 144 mg (04/06/2019), 144 mg (04/14/2019), 144 mg (04/20/2019)  fosaprepitant   (EMEND ) 150 mg  dexamethasone  (DECADRON ) 12 mg in sodium chloride  0.9 % 145 mL IVPB, , Intravenous,  Once, 4 of 4 cycles Administration:  (12/15/2018),  (12/29/2018),  (01/12/2019),  (01/26/2019)     06/01/2019 - 07/21/2019 Radiation Therapy   The patient initially received a dose of 50.4 Gy in 28 fractions to the breast using whole-breast tangent fields. This was delivered using a 3-D conformal technique. The pt received a boost delivering an additional 10 Gy in 5 fractions using a electron boost with electrons. The total dose was 60.4 Gy.    08/2019 - 04/2023 Anti-estrogen oral therapy   Anastrozole    04/02/2023 Relapse/Recurrence   CT CAP 04/02/2023: Small bilateral pleural effusions, multiple small bilateral pulmonary nodules measuring up to 5 mm, right paratracheal and right hilar lymph nodes indeterminate.  Numerous lytic lesions in axial and appendical skeleton concerning for metastatic disease or myeloma.  Possible acute left seventh eighth ninth rib fractures hypodensities within the liver subcentimeter     04/30/2023 PET scan   PET/CT scan 04/30/2023: Widespread bone metastases cervical thoracic lumbar spine sternum ribs shoulders and pelvis, hypermetabolic right paratracheal and right hilar lymphadenopathy, tiny pulmonary nodules: Patient has appointment to see Dr. Brenna for bronchoscopy and biopsy.   05/07/2023 Treatment Plan Change   Letrozole  daily, Verzenio , Xgeva    05/10/2023 - 06/03/2023 Radiation Therapy   Plan Name: Chest_R_Ribs Site: Ribs, Right Technique: Isodose Plan Mode: Photon Dose Per Fraction: 3 Gy Prescribed Dose (Delivered / Prescribed): 30 Gy / 30 Gy Prescribed Fxs (Delivered / Prescribed): 10 / 10   Plan Name: Chest_L_Ribs Site: Ribs, Left Technique: Isodose Plan Mode: Photon Dose Per Fraction: 3 Gy Prescribed Dose (Delivered / Prescribed): 30 Gy / 30 Gy Prescribed Fxs (Delivered / Prescribed): 10 / 10   Plan Name: Pelvis Site: [place],  Right Technique: Isodose Plan Mode: Photon Dose Per Fraction: 3 Gy Prescribed Dose (Delivered / Prescribed): 21 Gy / 21 Gy Prescribed Fxs (Delivered / Prescribed): 7 / 7   Plan Name: Pelvis:1 Site: [place], Right Technique: Isodose Plan Mode: Photon Dose Per Fraction: 3 Gy Prescribed Dose (Delivered / Prescribed): 9 Gy / 9 Gy Prescribed Fxs (Delivered / Prescribed): 3 / 3   Plan Name: Pelvis_Bst Site: Pelvis Technique: Isodose Plan Mode: Photon Dose Per Fraction: 4 Gy Prescribed Dose (Delivered / Prescribed): 12 Gy / 12 Gy Prescribed Fxs (Delivered / Prescribed): 3 / 3       CHIEF COMPLIANT: Follow-up of metastatic breast cancer on Verzinio  HISTORY OF PRESENT ILLNESS: History of Present Illness Kayla Banks is a 68 year old female with a history of breast cancer who presents for follow-up after recent imaging studies.  Recent imaging on May 06, 2024, shows a small spot on the right lobe of her liver, scarring at the base of her lungs, and bone lesions with no changes. Rib fractures and a density next to the kidney are also noted. Blood work today indicates a slight drop in hemoglobin from 11.4 to 10.8, with normal white blood cell count, electrolytes, kidney, and liver functions. She is currently taking Abemaciclib  (Verzenio ) and Letrozole  (Femara ) with sufficient refills.     ALLERGIES:  is allergic to gadolinium derivatives, erythromycin, lisinopril, penicillins, and adhesive [tape].  MEDICATIONS:  Current Outpatient Medications  Medication Sig Dispense Refill   abemaciclib  (VERZENIO ) 50 MG tablet Take 1 tablet (50 mg total) by  mouth 2 (two) times daily. Swallow tablets whole. Do not chew, crush, or split tablets before swallowing. 56 tablet 5   amLODipine  (NORVASC ) 5 MG tablet Take 1 tablet (5 mg total) by mouth daily. Needs ov before any more refills 90 tablet 1   Calcium Carbonate-Vitamin D (CALCIUM-VITAMIN D PO) Take by mouth.     carvedilol  (COREG )  25 MG tablet Take 1 tablet (25 mg total) by mouth 2 (two) times daily with a meal. Due for appt 03/2023 180 tablet 1   gabapentin  (NEURONTIN ) 300 MG capsule Take 1 capsule (300 mg total) by mouth 2 (two) times daily. 60 capsule 1   letrozole  (FEMARA ) 2.5 MG tablet Take 1 tablet (2.5 mg total) by mouth daily. 90 tablet 3   meloxicam  (MOBIC ) 15 MG tablet Take 1 tablet (15 mg total) by mouth daily. 90 tablet 1   SYNTHROID  100 MCG tablet Take 1 tablet (100 mcg total) by mouth daily before breakfast. 90 tablet 1   No current facility-administered medications for this visit.    PHYSICAL EXAMINATION: ECOG PERFORMANCE STATUS: 1 - Symptomatic but completely ambulatory  Vitals:   05/15/24 1047  BP: 139/62  Pulse: 63  Resp: 17  Temp: 97.6 F (36.4 C)  SpO2: 100%   Filed Weights   05/15/24 1047  Weight: 234 lb 8 oz (106.4 kg)     LABORATORY DATA:  I have reviewed the data as listed    Latest Ref Rng & Units 05/15/2024   10:34 AM 02/24/2024    9:53 AM 01/04/2024   10:41 AM  CMP  Glucose 70 - 99 mg/dL 92  897  99   BUN 8 - 23 mg/dL 15  21  11    Creatinine 0.44 - 1.00 mg/dL 9.03  9.03  9.27   Sodium 135 - 145 mmol/L 140  143  139   Potassium 3.5 - 5.1 mmol/L 4.0  4.6  4.1   Chloride 98 - 111 mmol/L 106  106  104   CO2 22 - 32 mmol/L 30  32  26   Calcium 8.9 - 10.3 mg/dL 8.6  9.6  8.7   Total Protein 6.5 - 8.1 g/dL 6.7  6.9  7.3   Total Bilirubin 0.0 - 1.2 mg/dL 0.8  0.8  0.8   Alkaline Phos 38 - 126 U/L 56  55  61   AST 15 - 41 U/L 13  13  16    ALT 0 - 44 U/L 8  8  9      Lab Results  Component Value Date   WBC 4.9 05/15/2024   HGB 10.8 (L) 05/15/2024   HCT 33.2 (L) 05/15/2024   MCV 98.5 05/15/2024   PLT 175 05/15/2024   NEUTROABS 3.5 05/15/2024    ASSESSMENT & PLAN:  Malignant neoplasm of upper-outer quadrant of left breast in female, estrogen receptor positive (HCC) Left lumpectomy: Grade 2 IDC with DCIS, 1 cm, margins negative, DCIS focally 0.1 cm from superior margin,  2/2 lymph nodes positive, ER 95%, PR 45%, HER-2 negative, Ki-67 20% AXLND: 3/20 LN Pos (total 5/22)   Treatment Plan: 1. Adj chemo with DD Adriamycin  and Cytoxan  followed by Taxol  weekly X 11 (stopped for neuropathy) completed 04/19/2019 2. Adj XRT 06/01/2019-07/21/2019 3. Adj Anti estrogen therapy with anastrozole  started 07/26/2019 4.  Metastatic disease diagnosed 05/13/2023 --------------------------------------------------------------------------------------------- Current treatment: Letrozole  and Verzenio  05/10/2023   Bone density 05/05/2022: T score of 0.3: Normal    CT CAP 04/02/2023: Small bilateral pleural effusions, multiple small  bilateral pulmonary nodules measuring up to 5 mm, right paratracheal and right hilar lymph nodes indeterminate.  Numerous lytic lesions in axial and appendical skeleton concerning for metastatic disease or myeloma.  Possible acute left seventh eighth ninth rib fractures hypodensities within the liver subcentimeter   05/13/2023: FNA lymph node: Metastatic breast cancer (positive for GATA 3)   Treatment plan: PET/CT scan 04/30/2023: Widespread bone metastases cervical thoracic lumbar spine sternum ribs shoulders and pelvis, hypermetabolic right paratracheal and right hilar lymphadenopathy, tiny pulmonary nodules: Patient has appointment to see Dr. Brenna for bronchoscopy and biopsy. Current treatment: Anastrozole  with Verzenio  started 05/04/2023 Bone metastasis: Xgeva  along with calcium and vitamin D Dr. Dewey for palliative radiation 05/27/2023-06/03/2023   Verzinio toxicities: (Now at 50 mg BiD) Nausea Diarrhea   Brain MRI 07/01/2023: No brain mets Bone mets: Xgeva  in 1 month CT CAP 08/06/2023: Interval resolution of right paratracheal lymph node, stable bone metastasis, lung nodules stable CT CAP 10/28/2023: No evidence of progression of metastatic disease.  Stable bone metastases nonspecific groundglass opacity left lower lobe, stable 2 mm right lung nodules,  stable para-aortic nodule 1.3 cm   CT CAP 05/06/2024: New ill-defined hypodensity right lobe of the liver 10 mm (recommend MRI) masslike consolidation bilateral lower lobes (bronchiectasis/inflammatory), stable bone metastasis, rib fractures left para-aortic density 1.5 cm (lymphangioma)  We discussed the pros and cons of obtaining a liver MRI versus recheck of scans in 3 months.  Patient is not very interested in getting a liver MRI.  Therefore I will order it CT scan in 3 months and follow-up after that. ------------------------------------- Assessment and Plan Assessment & Plan Malignant neoplasm of upper-outer quadrant of left breast with indeterminate liver lesion and stable bone metastases Indeterminate liver lesion on right lobe requires further evaluation. Bone metastases stable. Continuing Abemaciclib  (Verzenio ). - Schedule CT scan in three months to reassess liver lesion. - Continue Abemaciclib  (Verzenio ) with current refills.  Anemia Mild anemia with hemoglobin at 10.8, decreased from 11.4. Plan to monitor.  Pulmonary scarring and inflammation Scarring at lung bases likely due to inflammation. No acute intervention needed.      Orders Placed This Encounter  Procedures   CT CHEST ABDOMEN PELVIS W CONTRAST    Standing Status:   Future    Expected Date:   08/14/2024    Expiration Date:   05/15/2025    If indicated for the ordered procedure, I authorize the administration of contrast media per Radiology protocol:   Yes    Does the patient have a contrast media/X-ray dye allergy?:   No    Preferred imaging location?:   [facility]    Release to patient:   Immediate    If indicated for the ordered procedure, I authorize the administration of oral contrast media per Radiology protocol:   No    Reason for no oral contrast::   breast   The patient has a good understanding of the overall plan. she agrees with it. she will call with any problems that may develop before the  next visit here. Total time spent: 30 mins including face to face time and time spent for planning, charting and co-ordination of care   Naomi MARLA Chad, MD 05/15/24

## 2024-05-15 NOTE — Assessment & Plan Note (Signed)
 Left lumpectomy: Grade 2 IDC with DCIS, 1 cm, margins negative, DCIS focally 0.1 cm from superior margin, 2/2 lymph nodes positive, ER 95%, PR 45%, HER-2 negative, Ki-67 20% AXLND: 3/20 LN Pos (total 5/22)   Treatment Plan: 1. Adj chemo with DD Adriamycin  and Cytoxan  followed by Taxol  weekly X 11 (stopped for neuropathy) completed 04/19/2019 2. Adj XRT 06/01/2019-07/21/2019 3. Adj Anti estrogen therapy with anastrozole  started 07/26/2019 4.  Metastatic disease diagnosed 05/13/2023 --------------------------------------------------------------------------------------------- Current treatment: Letrozole  and Verzenio  05/10/2023   Bone density 05/05/2022: T score of 0.3: Normal    CT CAP 04/02/2023: Small bilateral pleural effusions, multiple small bilateral pulmonary nodules measuring up to 5 mm, right paratracheal and right hilar lymph nodes indeterminate.  Numerous lytic lesions in axial and appendical skeleton concerning for metastatic disease or myeloma.  Possible acute left seventh eighth ninth rib fractures hypodensities within the liver subcentimeter   05/13/2023: FNA lymph node: Metastatic breast cancer (positive for GATA 3)   Treatment plan: PET/CT scan 04/30/2023: Widespread bone metastases cervical thoracic lumbar spine sternum ribs shoulders and pelvis, hypermetabolic right paratracheal and right hilar lymphadenopathy, tiny pulmonary nodules: Patient has appointment to see Dr. Brenna for bronchoscopy and biopsy. Current treatment: Anastrozole  with Verzenio  started 05/04/2023 Bone metastasis: Xgeva  along with calcium and vitamin D Dr. Dewey for palliative radiation 05/27/2023-06/03/2023   Verzinio toxicities: (Now at 50 mg BiD) Nausea Diarrhea   Brain MRI 07/01/2023: No brain mets Bone mets: Xgeva  in 1 month CT CAP 08/06/2023: Interval resolution of right paratracheal lymph node, stable bone metastasis, lung nodules stable CT CAP 10/28/2023: No evidence of progression of metastatic disease.   Stable bone metastases nonspecific groundglass opacity left lower lobe, stable 2 mm right lung nodules, stable para-aortic nodule 1.3 cm   CT CAP 05/06/2024: New ill-defined hypodensity right lobe of the liver 10 mm (recommend MRI) masslike consolidation bilateral lower lobes (bronchiectasis/inflammatory), stable bone metastasis, rib fractures left para-aortic density 1.5 cm (lymphangioma)  We will obtain a liver MRI in follow-up of the liver lesion.

## 2024-05-16 ENCOUNTER — Telehealth: Payer: Self-pay | Admitting: Hematology and Oncology

## 2024-05-16 NOTE — Telephone Encounter (Signed)
 Call pt to schedule appts based off of 9/29 LOS notes and she stated she would have to schedule CT for different time that was ordered. she will call back to schedule other appts.

## 2024-05-29 ENCOUNTER — Inpatient Hospital Stay

## 2024-05-29 ENCOUNTER — Inpatient Hospital Stay: Attending: Hematology and Oncology

## 2024-05-29 VITALS — BP 168/73 | HR 78 | Temp 98.8°F | Resp 16

## 2024-05-29 DIAGNOSIS — Z1721 Progesterone receptor positive status: Secondary | ICD-10-CM | POA: Insufficient documentation

## 2024-05-29 DIAGNOSIS — C50412 Malignant neoplasm of upper-outer quadrant of left female breast: Secondary | ICD-10-CM | POA: Insufficient documentation

## 2024-05-29 DIAGNOSIS — Z923 Personal history of irradiation: Secondary | ICD-10-CM | POA: Diagnosis not present

## 2024-05-29 DIAGNOSIS — Z1732 Human epidermal growth factor receptor 2 negative status: Secondary | ICD-10-CM | POA: Diagnosis not present

## 2024-05-29 DIAGNOSIS — C7951 Secondary malignant neoplasm of bone: Secondary | ICD-10-CM | POA: Diagnosis present

## 2024-05-29 DIAGNOSIS — Z170 Estrogen receptor positive status [ER+]: Secondary | ICD-10-CM | POA: Insufficient documentation

## 2024-05-29 DIAGNOSIS — Z79811 Long term (current) use of aromatase inhibitors: Secondary | ICD-10-CM | POA: Insufficient documentation

## 2024-05-29 DIAGNOSIS — Z95828 Presence of other vascular implants and grafts: Secondary | ICD-10-CM

## 2024-05-29 MED ORDER — DENOSUMAB 120 MG/1.7ML ~~LOC~~ SOLN
120.0000 mg | Freq: Once | SUBCUTANEOUS | Status: AC
  Administered 2024-05-29: 120 mg via SUBCUTANEOUS
  Filled 2024-05-29: qty 1.7

## 2024-05-30 ENCOUNTER — Other Ambulatory Visit: Payer: Self-pay | Admitting: Hematology and Oncology

## 2024-05-31 ENCOUNTER — Encounter: Payer: Self-pay | Admitting: Hematology and Oncology

## 2024-06-07 ENCOUNTER — Other Ambulatory Visit: Payer: Self-pay

## 2024-06-07 NOTE — Progress Notes (Signed)
 Specialty Pharmacy Refill Coordination Note  Kayla Banks is a 68 y.o. female contacted today regarding refills of specialty medication(s) Abemaciclib  (VERZENIO )   Patient requested Delivery   Delivery date: 06/09/24   Verified address: [address], [place], [address]   Medication will be filled on 06/08/24.

## 2024-06-29 ENCOUNTER — Other Ambulatory Visit: Payer: Self-pay | Admitting: Family Medicine

## 2024-06-29 ENCOUNTER — Other Ambulatory Visit: Payer: Self-pay

## 2024-06-29 DIAGNOSIS — I10 Essential (primary) hypertension: Secondary | ICD-10-CM

## 2024-06-29 DIAGNOSIS — M170 Bilateral primary osteoarthritis of knee: Secondary | ICD-10-CM

## 2024-06-29 MED ORDER — AMLODIPINE BESYLATE 5 MG PO TABS: 5.0000 mg | ORAL_TABLET | Freq: Every day | ORAL | 0 refills | Status: DC

## 2024-07-03 ENCOUNTER — Other Ambulatory Visit (HOSPITAL_COMMUNITY): Payer: Self-pay

## 2024-07-05 ENCOUNTER — Other Ambulatory Visit: Payer: Self-pay

## 2024-07-27 ENCOUNTER — Other Ambulatory Visit: Payer: Self-pay

## 2024-07-27 ENCOUNTER — Other Ambulatory Visit (HOSPITAL_COMMUNITY): Payer: Self-pay

## 2024-07-27 NOTE — Progress Notes (Signed)
 Specialty Pharmacy Refill Coordination Note  Spoke with Sunday Klos  Jozette Castrellon is a 68 y.o. female contacted today regarding refills of specialty medication(s) Abemaciclib  (VERZENIO )  Doses on hand: 10  Patient requested: Delivery   Delivery date: 07/31/24   Verified address: [address] [place]  [address]  Medication will be filled on 07/28/24

## 2024-07-28 ENCOUNTER — Other Ambulatory Visit: Payer: Self-pay

## 2024-07-29 ENCOUNTER — Other Ambulatory Visit: Payer: Self-pay | Admitting: Family Medicine

## 2024-07-29 DIAGNOSIS — I10 Essential (primary) hypertension: Secondary | ICD-10-CM

## 2024-07-31 ENCOUNTER — Other Ambulatory Visit: Payer: Self-pay | Admitting: Family Medicine

## 2024-07-31 DIAGNOSIS — I10 Essential (primary) hypertension: Secondary | ICD-10-CM

## 2024-08-18 ENCOUNTER — Encounter: Payer: Self-pay | Admitting: Hematology and Oncology

## 2024-08-18 ENCOUNTER — Other Ambulatory Visit (HOSPITAL_COMMUNITY): Payer: Self-pay

## 2024-08-21 ENCOUNTER — Other Ambulatory Visit (HOSPITAL_COMMUNITY): Payer: Self-pay

## 2024-08-21 ENCOUNTER — Encounter: Payer: Self-pay | Admitting: Hematology and Oncology

## 2024-08-22 ENCOUNTER — Encounter: Payer: Self-pay | Admitting: Hematology and Oncology

## 2024-08-22 ENCOUNTER — Other Ambulatory Visit (HOSPITAL_COMMUNITY): Payer: Self-pay

## 2024-08-22 ENCOUNTER — Telehealth: Payer: Self-pay

## 2024-08-22 ENCOUNTER — Other Ambulatory Visit: Payer: Self-pay

## 2024-08-22 NOTE — Telephone Encounter (Signed)
 Oral Oncology Patient Advocate Encounter   Was successful in Renewing a copay card for Verzenio  for year 2026.  This copay card will make the patients copay $0.00.    The billing information is as follows and has been shared with [facility].   RxBin: [id number] PCN: [id number] Member ID: [id number] Group ID: [id number]    Charlott Hamilton,  CPhT-Adv  she/her/hers [facility]  [facility] Pharmacy Technician Patient Advocate Specialist III [facility] Phone: [phone]  Fax: [phone] [email or web address]

## 2024-08-22 NOTE — Progress Notes (Signed)
 Specialty Pharmacy Refill Coordination Note  Kayla Banks is a 69 y.o. female contacted today regarding refills of specialty medication(s) Abemaciclib  (VERZENIO )   Patient requested Delivery   Delivery date: 08/23/24   Verified address: [address] [place] [address]   Medication will be filled on: 08/22/24

## 2024-08-23 ENCOUNTER — Other Ambulatory Visit (HOSPITAL_COMMUNITY): Payer: Self-pay

## 2024-08-23 ENCOUNTER — Ambulatory Visit (HOSPITAL_COMMUNITY)
Admission: RE | Admit: 2024-08-23 | Discharge: 2024-08-23 | Disposition: A | Discharge status: Home / Self Care | Source: Ambulatory Visit | Attending: Hematology and Oncology | Admitting: Hematology and Oncology

## 2024-08-23 DIAGNOSIS — Z170 Estrogen receptor positive status [ER+]: Secondary | ICD-10-CM | POA: Diagnosis present

## 2024-08-23 DIAGNOSIS — C50412 Malignant neoplasm of upper-outer quadrant of left female breast: Secondary | ICD-10-CM | POA: Diagnosis present

## 2024-08-23 MED ORDER — IOHEXOL 300 MG/ML  SOLN
100.0000 mL | Freq: Once | INTRAMUSCULAR | Status: AC | PRN
  Administered 2024-08-23: 100 mL via INTRAVENOUS

## 2024-08-28 ENCOUNTER — Other Ambulatory Visit (HOSPITAL_COMMUNITY): Payer: Self-pay

## 2024-08-28 ENCOUNTER — Other Ambulatory Visit: Payer: Self-pay | Admitting: Family Medicine

## 2024-08-28 DIAGNOSIS — I10 Essential (primary) hypertension: Secondary | ICD-10-CM

## 2024-08-31 ENCOUNTER — Telehealth: Payer: Self-pay | Admitting: Hematology and Oncology

## 2024-08-31 ENCOUNTER — Other Ambulatory Visit: Payer: Self-pay | Admitting: Hematology and Oncology

## 2024-08-31 DIAGNOSIS — Z170 Estrogen receptor positive status [ER+]: Secondary | ICD-10-CM

## 2024-08-31 DIAGNOSIS — C7951 Secondary malignant neoplasm of bone: Secondary | ICD-10-CM

## 2024-08-31 NOTE — Telephone Encounter (Signed)
 Left vm about scheduled appt date and time. Encouraged to call back if need to reschedule

## 2024-08-31 NOTE — Progress Notes (Signed)
 I discussed with Kayla Banks that the CT scans showing subtle increase in the liver with concern for progression of disease.  We we will obtain a PET CT scan for further evaluation.  Patient is agreeable.

## 2024-09-12 ENCOUNTER — Other Ambulatory Visit: Payer: Self-pay

## 2024-09-14 ENCOUNTER — Other Ambulatory Visit: Payer: Self-pay

## 2024-09-14 ENCOUNTER — Encounter (HOSPITAL_COMMUNITY)
Admission: RE | Admit: 2024-09-14 | Discharge: 2024-09-14 | Disposition: A | Discharge status: Home / Self Care | Source: Ambulatory Visit | Attending: Hematology and Oncology | Admitting: Hematology and Oncology

## 2024-09-14 DIAGNOSIS — C50412 Malignant neoplasm of upper-outer quadrant of left female breast: Secondary | ICD-10-CM | POA: Insufficient documentation

## 2024-09-14 DIAGNOSIS — C7951 Secondary malignant neoplasm of bone: Secondary | ICD-10-CM | POA: Diagnosis present

## 2024-09-14 DIAGNOSIS — Z170 Estrogen receptor positive status [ER+]: Secondary | ICD-10-CM | POA: Diagnosis present

## 2024-09-14 LAB — GLUCOSE, CAPILLARY: Glucose-Capillary: 90 mg/dL (ref 70–99)

## 2024-09-14 MED ORDER — FLUDEOXYGLUCOSE F - 18 (FDG) INJECTION
11.5000 | Freq: Once | INTRAVENOUS | Status: AC | PRN
  Administered 2024-09-14: 12.6 via INTRAVENOUS

## 2024-09-14 NOTE — Progress Notes (Signed)
 Specialty Pharmacy Refill Coordination Note  Kayla Banks is a 69 y.o. female contacted today regarding refills of specialty medication(s) Abemaciclib  (VERZENIO )   Patient requested Delivery   Delivery date: 09/20/24   Verified address: [address], [place], [place] [address]   Medication will be filled on: 09/19/24

## 2024-09-19 ENCOUNTER — Ambulatory Visit: Payer: Self-pay | Admitting: Hematology and Oncology

## 2024-09-19 ENCOUNTER — Other Ambulatory Visit: Payer: Self-pay

## 2024-09-19 DIAGNOSIS — C50412 Malignant neoplasm of upper-outer quadrant of left female breast: Secondary | ICD-10-CM

## 2024-09-20 ENCOUNTER — Encounter (HOSPITAL_COMMUNITY): Payer: Self-pay

## 2024-09-20 NOTE — Progress Notes (Signed)
 Kayla Banks POUR, MD  Kaci Dillie Approved for US  CORE BX of LIVER LESION.  Hx breast cancer.  Mod sedation +  HKM       Previous Messages    ----- Message ----- From: Kayla Banks Sent: 09/19/2024   3:54 PM EST To: Kayla Banks; Kayla Banks Subject: US  liver bx                                    Procedure : US  liver bx  Reason:  New liver lesions Dx: Malignant neoplasm of upper-outer quadrant of left breast in female, estrogen receptor positive (HCC) [C50.412, Z17.0 (ICD-10-CM)]  History :NM PET restage kull base to thigh  , ct chest abd pelv w/    Provider : Dr. Odean Banks  Contact : [phone]

## 2024-09-21 ENCOUNTER — Other Ambulatory Visit: Payer: Self-pay

## 2024-09-21 ENCOUNTER — Other Ambulatory Visit: Payer: Self-pay | Admitting: Pharmacist

## 2024-09-21 NOTE — Progress Notes (Signed)
 Specialty Pharmacy Ongoing Clinical Assessment Note  Kayla Banks is a 69 y.o. female who is being followed by the specialty pharmacy service for RxSp Oncology   Patient's specialty medication(s) reviewed today: Abemaciclib  (VERZENIO )   Missed doses in the last 4 weeks: 2   Patient/Caregiver did not have any additional questions or concerns.   Therapeutic benefit summary: Patient is achieving benefit   Adverse events/side effects summary: No adverse events/side effects   Patient's therapy is appropriate to: Continue    Goals Addressed             This Visit's Progress    Slow Disease Progression       Patient is on track. Patient will maintain adherence. Last CT from 10/28/23 showed stable metastatic disease with no signs of progression. CT on 05/16/24 showed stable bone metastasis and a new lesion on the liver. Patient will have follow up MRI for liver lesion.          Follow up: 6 months  Lyle LELON Chalk Specialty Pharmacist

## 2024-09-29 ENCOUNTER — Ambulatory Visit (HOSPITAL_COMMUNITY)

## 2024-10-03 ENCOUNTER — Inpatient Hospital Stay: Admitting: Hematology and Oncology

## 2024-10-03 ENCOUNTER — Inpatient Hospital Stay
